# Patient Record
Sex: Female | Born: 1965
Health system: Southern US, Community
[De-identification: ages and names within clinical notes are randomized; demographics above are authoritative.]

## PROBLEM LIST (undated history)

## (undated) DIAGNOSIS — M5431 Sciatica, right side: Secondary | ICD-10-CM

## (undated) DIAGNOSIS — G43909 Migraine, unspecified, not intractable, without status migrainosus: Secondary | ICD-10-CM

## (undated) DIAGNOSIS — F329 Major depressive disorder, single episode, unspecified: Secondary | ICD-10-CM

## (undated) DIAGNOSIS — IMO0002 Reserved for concepts with insufficient information to code with codable children: Secondary | ICD-10-CM

## (undated) DIAGNOSIS — R42 Dizziness and giddiness: Secondary | ICD-10-CM

## (undated) DIAGNOSIS — F32A Depression, unspecified: Secondary | ICD-10-CM

## (undated) DIAGNOSIS — T7840XA Allergy, unspecified, initial encounter: Secondary | ICD-10-CM

## (undated) DIAGNOSIS — Z90710 Acquired absence of both cervix and uterus: Secondary | ICD-10-CM

## (undated) DIAGNOSIS — K219 Gastro-esophageal reflux disease without esophagitis: Secondary | ICD-10-CM

## (undated) DIAGNOSIS — I639 Cerebral infarction, unspecified: Secondary | ICD-10-CM

## (undated) DIAGNOSIS — R001 Bradycardia, unspecified: Secondary | ICD-10-CM

## (undated) DIAGNOSIS — F419 Anxiety disorder, unspecified: Secondary | ICD-10-CM

## (undated) DIAGNOSIS — R55 Syncope and collapse: Secondary | ICD-10-CM

## (undated) DIAGNOSIS — R7303 Prediabetes: Secondary | ICD-10-CM

## (undated) DIAGNOSIS — Z9049 Acquired absence of other specified parts of digestive tract: Secondary | ICD-10-CM

## (undated) DIAGNOSIS — Z720 Tobacco use: Secondary | ICD-10-CM

## (undated) DIAGNOSIS — D509 Iron deficiency anemia, unspecified: Secondary | ICD-10-CM

## (undated) DIAGNOSIS — I10 Essential (primary) hypertension: Secondary | ICD-10-CM

## (undated) DIAGNOSIS — G4730 Sleep apnea, unspecified: Secondary | ICD-10-CM

## (undated) HISTORY — PX: ABDOMINAL HYSTERECTOMY: SHX81

## (undated) HISTORY — PX: CHOLECYSTECTOMY: SHX55

## (undated) HISTORY — PX: HEMORRHOID SURGERY: SHX153

## (undated) HISTORY — DX: Allergy, unspecified, initial encounter: T78.40XA

---

## 2001-02-05 ENCOUNTER — Inpatient Hospital Stay (HOSPITAL_COMMUNITY): Admission: EM | Admit: 2001-02-05 | Discharge: 2001-02-06 | Payer: Self-pay | Admitting: Emergency Medicine

## 2001-02-05 ENCOUNTER — Encounter: Payer: Self-pay | Admitting: *Deleted

## 2001-02-11 ENCOUNTER — Ambulatory Visit (HOSPITAL_COMMUNITY): Admission: RE | Admit: 2001-02-11 | Discharge: 2001-02-11 | Payer: Self-pay | Admitting: Family Medicine

## 2001-06-19 ENCOUNTER — Emergency Department (HOSPITAL_COMMUNITY): Admission: EM | Admit: 2001-06-19 | Discharge: 2001-06-19 | Payer: Self-pay | Admitting: *Deleted

## 2001-06-19 ENCOUNTER — Encounter: Payer: Self-pay | Admitting: *Deleted

## 2002-11-19 ENCOUNTER — Ambulatory Visit (HOSPITAL_COMMUNITY): Admission: RE | Admit: 2002-11-19 | Discharge: 2002-11-19 | Payer: Self-pay | Admitting: Family Medicine

## 2002-11-19 ENCOUNTER — Encounter: Payer: Self-pay | Admitting: Family Medicine

## 2004-04-19 ENCOUNTER — Ambulatory Visit (HOSPITAL_COMMUNITY): Admission: RE | Admit: 2004-04-19 | Discharge: 2004-04-19 | Payer: Self-pay | Admitting: Family Medicine

## 2004-07-11 ENCOUNTER — Ambulatory Visit (HOSPITAL_COMMUNITY): Admission: RE | Admit: 2004-07-11 | Discharge: 2004-07-11 | Payer: Self-pay | Admitting: *Deleted

## 2005-01-22 ENCOUNTER — Emergency Department (HOSPITAL_COMMUNITY): Admission: EM | Admit: 2005-01-22 | Discharge: 2005-01-22 | Payer: Self-pay | Admitting: Emergency Medicine

## 2005-05-21 HISTORY — PX: COLONOSCOPY WITH ESOPHAGOGASTRODUODENOSCOPY (EGD): SHX5779

## 2005-09-04 ENCOUNTER — Ambulatory Visit (HOSPITAL_COMMUNITY): Admission: RE | Admit: 2005-09-04 | Discharge: 2005-09-04 | Payer: Self-pay | Admitting: Family Medicine

## 2005-10-18 ENCOUNTER — Ambulatory Visit: Payer: Self-pay | Admitting: Internal Medicine

## 2005-10-25 ENCOUNTER — Emergency Department (HOSPITAL_COMMUNITY): Admission: EM | Admit: 2005-10-25 | Discharge: 2005-10-25 | Payer: Self-pay | Admitting: Emergency Medicine

## 2005-10-25 ENCOUNTER — Encounter: Payer: Self-pay | Admitting: Internal Medicine

## 2005-11-17 ENCOUNTER — Emergency Department (HOSPITAL_COMMUNITY): Admission: EM | Admit: 2005-11-17 | Discharge: 2005-11-17 | Payer: Self-pay | Admitting: *Deleted

## 2005-12-03 ENCOUNTER — Encounter (INDEPENDENT_AMBULATORY_CARE_PROVIDER_SITE_OTHER): Payer: Self-pay | Admitting: *Deleted

## 2005-12-03 ENCOUNTER — Ambulatory Visit: Payer: Self-pay | Admitting: Internal Medicine

## 2006-01-24 ENCOUNTER — Encounter (HOSPITAL_COMMUNITY): Admission: RE | Admit: 2006-01-24 | Discharge: 2006-02-16 | Payer: Self-pay | Admitting: Family Medicine

## 2006-03-07 ENCOUNTER — Ambulatory Visit (HOSPITAL_COMMUNITY): Admission: RE | Admit: 2006-03-07 | Discharge: 2006-03-08 | Payer: Self-pay | Admitting: General Surgery

## 2006-03-07 ENCOUNTER — Encounter (INDEPENDENT_AMBULATORY_CARE_PROVIDER_SITE_OTHER): Payer: Self-pay | Admitting: Specialist

## 2006-12-02 ENCOUNTER — Ambulatory Visit: Payer: Self-pay | Admitting: Internal Medicine

## 2006-12-05 ENCOUNTER — Ambulatory Visit: Payer: Self-pay | Admitting: Cardiology

## 2007-02-12 ENCOUNTER — Ambulatory Visit: Payer: Self-pay | Admitting: Internal Medicine

## 2007-04-20 ENCOUNTER — Emergency Department (HOSPITAL_COMMUNITY): Admission: EM | Admit: 2007-04-20 | Discharge: 2007-04-20 | Payer: Self-pay | Admitting: Emergency Medicine

## 2007-08-01 DIAGNOSIS — K625 Hemorrhage of anus and rectum: Secondary | ICD-10-CM

## 2007-08-01 DIAGNOSIS — K529 Noninfective gastroenteritis and colitis, unspecified: Secondary | ICD-10-CM | POA: Insufficient documentation

## 2007-08-01 DIAGNOSIS — R1013 Epigastric pain: Secondary | ICD-10-CM

## 2007-08-01 DIAGNOSIS — R197 Diarrhea, unspecified: Secondary | ICD-10-CM

## 2007-08-01 DIAGNOSIS — D509 Iron deficiency anemia, unspecified: Secondary | ICD-10-CM

## 2007-09-16 ENCOUNTER — Ambulatory Visit: Admission: RE | Admit: 2007-09-16 | Discharge: 2007-09-16 | Payer: Self-pay | Admitting: Family Medicine

## 2008-02-23 ENCOUNTER — Emergency Department (HOSPITAL_COMMUNITY): Admission: EM | Admit: 2008-02-23 | Discharge: 2008-02-23 | Payer: Self-pay | Admitting: Emergency Medicine

## 2008-10-12 ENCOUNTER — Emergency Department (HOSPITAL_COMMUNITY): Admission: EM | Admit: 2008-10-12 | Discharge: 2008-10-12 | Payer: Self-pay | Admitting: Emergency Medicine

## 2010-06-05 ENCOUNTER — Emergency Department (HOSPITAL_COMMUNITY)
Admission: EM | Admit: 2010-06-05 | Discharge: 2010-06-05 | Payer: Self-pay | Source: Home / Self Care | Admitting: Emergency Medicine

## 2010-06-07 LAB — POCT CARDIAC MARKERS
CKMB, poc: <1 ng/mL — ABNORMAL LOW (ref 1.0–8.0)
CKMB, poc: <1 ng/mL — ABNORMAL LOW (ref 1.0–8.0)
Myoglobin, poc: 48.8 ng/mL (ref 12–200)
Myoglobin, poc: 38.6 ng/mL (ref 12–200)
Troponin i, poc: <0.05 ng/mL (ref 0.00–0.09)
Troponin i, poc: <0.05 ng/mL (ref 0.00–0.09)

## 2010-06-07 LAB — CBC
HCT: 36.4 % (ref 36.0–46.0)
Hemoglobin: 13.1 g/dL (ref 12.0–15.0)
MCH: 32.8 pg (ref 26.0–34.0)
MCHC: 36 g/dL (ref 30.0–36.0)
MCV: 91 fL (ref 78.0–100.0)
Platelets: 160 10*3/uL (ref 150–400)
RBC: 4 MIL/uL (ref 3.87–5.11)
RDW: 12.7 % (ref 11.5–15.5)
WBC: 4.7 10*3/uL (ref 4.0–10.5)

## 2010-06-07 LAB — BASIC METABOLIC PANEL
BUN: 11 mg/dL (ref 6–23)
CO2: 29 mEq/L (ref 19–32)
Calcium: 8.8 mg/dL (ref 8.4–10.5)
Chloride: 104 mEq/L (ref 96–112)
Creatinine, Ser: 1.09 mg/dL (ref 0.4–1.2)
GFR calc Af Amer: >60 mL/min (ref >60)
GFR calc non Af Amer: 55 mL/min — ABNORMAL LOW (ref >60)
Glucose, Bld: 105 mg/dL — ABNORMAL HIGH (ref 70–99)
Potassium: 3.2 mEq/L — ABNORMAL LOW (ref 3.5–5.1)
Sodium: 138 mEq/L (ref 135–145)

## 2010-06-09 ENCOUNTER — Ambulatory Visit (HOSPITAL_COMMUNITY)
Admission: RE | Admit: 2010-06-09 | Discharge: 2010-06-09 | Payer: Self-pay | Source: Home / Self Care | Attending: Family Medicine | Admitting: Family Medicine

## 2010-08-29 LAB — CBC
HCT: 40.2 % (ref 36.0–46.0)
Hemoglobin: 14.1 g/dL (ref 12.0–15.0)
MCHC: 35.2 g/dL (ref 30.0–36.0)
MCV: 94.8 fL (ref 78.0–100.0)
Platelets: 153 10*3/uL (ref 150–400)
RBC: 4.23 MIL/uL (ref 3.87–5.11)
RDW: 12.7 % (ref 11.5–15.5)
WBC: 5.4 10*3/uL (ref 4.0–10.5)

## 2010-08-29 LAB — COMPREHENSIVE METABOLIC PANEL
ALT: 12 U/L (ref 0–35)
AST: 21 U/L (ref 0–37)
Albumin: 3.6 g/dL (ref 3.5–5.2)
Alkaline Phosphatase: 61 U/L (ref 39–117)
BUN: 4 mg/dL — ABNORMAL LOW (ref 6–23)
CO2: 32 mEq/L (ref 19–32)
Calcium: 9.5 mg/dL (ref 8.4–10.5)
Chloride: 104 mEq/L (ref 96–112)
Creatinine, Ser: 0.98 mg/dL (ref 0.4–1.2)
GFR calc Af Amer: >60 mL/min (ref >60)
GFR calc non Af Amer: >60 mL/min (ref >60)
Glucose, Bld: 78 mg/dL (ref 70–99)
Potassium: 3.2 mEq/L — ABNORMAL LOW (ref 3.5–5.1)
Sodium: 141 mEq/L (ref 135–145)
Total Bilirubin: 0.6 mg/dL (ref 0.3–1.2)
Total Protein: 6.6 g/dL (ref 6.0–8.3)

## 2010-08-29 LAB — GLUCOSE, CAPILLARY: Glucose-Capillary: 124 mg/dL — ABNORMAL HIGH (ref 70–99)

## 2010-08-29 LAB — URINALYSIS, ROUTINE W REFLEX MICROSCOPIC
Bilirubin Urine: NEGATIVE
Glucose, UA: NEGATIVE mg/dL
Hgb urine dipstick: NEGATIVE
Ketones, ur: NEGATIVE mg/dL
Nitrite: NEGATIVE
Protein, ur: NEGATIVE mg/dL
Specific Gravity, Urine: 1.01 (ref 1.005–1.030)
Urobilinogen, UA: 0.2 mg/dL (ref 0.0–1.0)
pH: 5.5 (ref 5.0–8.0)

## 2010-08-29 LAB — DIFFERENTIAL
Basophils Absolute: 0 10*3/uL (ref 0.0–0.1)
Basophils Relative: 0 % (ref 0–1)
Eosinophils Absolute: 0.1 10*3/uL (ref 0.0–0.7)
Eosinophils Relative: 2 % (ref 0–5)
Lymphocytes Relative: 21 % (ref 12–46)
Lymphs Abs: 1.1 10*3/uL (ref 0.7–4.0)
Monocytes Absolute: 0.5 10*3/uL (ref 0.1–1.0)
Monocytes Relative: 9 % (ref 3–12)
Neutro Abs: 3.7 10*3/uL (ref 1.7–7.7)
Neutrophils Relative %: 69 % (ref 43–77)

## 2010-08-29 LAB — URINE MICROSCOPIC-ADD ON

## 2010-08-29 LAB — PREGNANCY, URINE: Preg Test, Ur: NEGATIVE

## 2010-10-03 NOTE — Procedures (Signed)
[name] [name], [name] [name]               ACCOUNT NO.:  [id number]   MEDICAL RECORD NO.:  [id number]          PATIENT TYPE:  OUT   LOCATION:  SLEE                          FACILITY:  [facility]   PHYSICIAN:  Kofi A. Gerilyn Pilgrim, M.D. DATE OF BIRTH:  Mar 02, 1966   DATE OF PROCEDURE:  09/16/2007  DATE OF DISCHARGE:  09/16/2007                             SLEEP DISORDER REPORT   POLYSOMNOGRAPHY REPORT   INDICATIONS:  This is a 45 year old lady who presents with snoring and  daytime fatigue.  Epworth sleepiness scale 8.  BMI 25.   MEDICATIONS:  Xanax and Estratest.   SLEEP STAGE SUMMARY:  The total recording time is 376 minutes.  The  sleep efficiency is 97%.  Sleep latency 4.5 minutes.  REM latency 185  minutes.  Stage N1 3%, N2 85%, N3 0, and REM sleep 9%.   RESPIRATORY SUMMARY:  The baseline oxygen saturation is 99%, lowest  saturation 91%.  AHI 0.8.   LEG MOVEMENT SUMMARY:  There is no significant leg movements observed.  The index is 0.   ELECTROCARDIOGRAM SUMMARY:  Average heart rate 57 with no disarrhythmias  observed.   IMPRESSION:  This recording is essentially unremarkable, since she does  have somewhat of an early sleep latency, however.  If further workup is  required, consider sleep consultation.   Thanks for this referral.      Kofi A. Gerilyn Pilgrim, M.D.  Electronically Signed     KAD/MEDQ  D:  09/20/2007  T:  09/20/2007  Job:  [id number]

## 2010-10-03 NOTE — Assessment & Plan Note (Signed)
[name]                         GASTROENTEROLOGY OFFICE NOTE   NAME:[name] [name], [name] [name]                      MRN:          [id number]  DATE:12/02/2006                            DOB:          08/07/1965    HISTORY OF PRESENT ILLNESS:  [name] [name] is a 45 year old African American  female patient of Dr. Lilyan Punt who comes with one episode of rather  large volume rectal bleeding which was evaluated by Dr. Gerda Diss within 24  hours of the event.  This occurred about three weeks ago.  She, since  then, has not had any rectal bleeding.  She had about two more bowel  movements with blood following the first episode within 24 hours, but  since then has done well.  She denies any rectal pain or irritation.  Dr. Gerda Diss examined her in the office and gave her Anusol suppositories  which she took only for three nights but stopped doing it because of  constipation.  We saw [name] [name] in the past for iron deficiency anemia.  She had upper endoscopy, colonoscopy and small bowel follow through in  July 2007, with no particular diagnosis.  There is a history of a  hemorrhoidectomy several years ago which was done by [facility].  She  denies being constipation.  There is low abdominal pain, sometimes more  so in right lobe of quadrant.  There is no family history of Crohn's  disease.   MEDICATIONS:  1. Centex one daily.  2. Iron supplements b.i.d.  3. Xanax 1 mg q.h.s.   PHYSICAL EXAMINATION:  VITAL SIGNS:  Blood pressure 98/64, pulse 60,  weight 128 pounds.  GENERAL:  She was alert and oriented, no distress.  LUNGS:  Clear to auscultation.  COR:  S1, S2 normal.  ABDOMEN:  Soft.  Somewhat tender in right lower quadrant without  associated fullness.  Bowel sounds were normoactive.  There was no  distention.  RECTAL/ANOSCOPIC EXAM:  Normal perianal area.  Rectal tone was somewhat  increased.  The patient was alert and comfortable and anoscope passed  into the rectal  ampulla. There was erythema of the rectal folds in the  anal canal, but no definite fissure and no bleeding noted.  Stools  hemoccult negative.  Mucosa of the rectal ampulla did not show any  evidence of proctitis.  There were just small hemorrhoids.  None of them  showed thrombosis.   IMPRESSION:  A 45 year old female with status post remote  hemorrhoidectomy with a large volume rectal bleeding recently.  It is  not clear from the endoscopic exam whether the erythema in the anal  canal is really what caused her problem.  She has been treated partially  with Anusol suppositories  and the mucosa appears reasonably good today.  Because of the right lower quadrant tenderness and patient's concern for  __________ amount of the bleeding, we will proceed with CT scan of the  abdomen and pelvis to rule out Crohn's disease.  Since she could not  tolerate any IC suppositories, I feel she needs some additional topical  steroids in the rectum,  and I gave her  ProctoFoam HC to use, since it does not require any  foreign body in the rectum, and would not likely alter her bowel habits.     [name] [name]. Juanda Chance, MD  Electronically Signed    DMB/MedQ  DD: 12/02/2006  DT: 12/03/2006  Job #: [id number]   cc:   Lorin Picket A. Gerda Diss, MD

## 2010-10-03 NOTE — Assessment & Plan Note (Signed)
[facility]                         GASTROENTEROLOGY OFFICE NOTE   NAME:[name], [name] REAL                      MRN:          [id number]  DATE:02/12/2007                            DOB:          1966/03/23    The patient is a 45 year old African-American female with recent onset  of diarrhea and crampy abdominal pain.  She called our office on  January 30, 2007, was out of town.  She was given Bentyl, Prilosec,  and Imodium, but it did not really do anything other than taking  Imodium.  Her stools are soft to loose, occurring up to eight times a  day.  She feels it is most likely stress related.  She had to move out  of her home, staying with friends.  She is a single parent.  The patient  continues to work full time for [employer].  She denies any rectal  bleeding.  We have evaluated her in the past for her iron deficiency  anemia as well as for abdominal pain.  She had a normal endoscopy and  colonoscopy in July of 2007 with random biopsies of the colon showing  normal appearing mucosa including terminal ileum.  She was suspected to  have Crohn's disease, but the workup was negative.  Most recently a CT  of abdomen and pelvis on December 05, 2006, showed essentially normal  examination with changes consistent with post cholecystectomy state  having some prominence of the common bile duct and hepatic ducts, but  everything otherwise was normal.   MEDICATIONS:  1. Syntax one p.o. daily.  2. Iron supplements one b.i.d.  3. Xanax 1 mg q.h.s.   PHYSICAL EXAMINATION:  VITAL SIGNS:  Blood pressure 108/64, pulse 56,  and weight 126 pounds.  GENERAL:  She was in no distress, healthy appearing.  LUNGS:  Clear to auscultation.  HEART:  Normal S1 and normal S2.  ABDOMEN:  Soft, diffusely tender with a well-healed surgical scar,  minimal tenderness in epigastrium.  RECTAL:  Not done.   IMPRESSION:  A 45 year old African-American female with post  cholecystectomy diarrhea and irritable bowel syndrome.  No evidence of  Crohn's disease based on a negative GI workup which has included  endoscopy, colonoscopy, CT scan of the abdomen as well as small bowel  follow through.   PLAN:  1. Continue Imodium once or twice a day.  2. Add Align as a probiotic.  3. Increase Xanax to 0.25 mg q.a.m., 0.5 mg q.h.s.  4. Samples of Aciphex 20 mg to be used daily for 10 days.  5. Consider using Questran for the diarrhea.   If she had to have another colonoscopy, I would opt for propofol  anesthesia because she was difficult to sedate and had a lot of pain  during the procedure.     Hedwig Morton. Juanda Chance, MD  Electronically Signed    DMB/MedQ  DD: 02/12/2007  DT: 02/13/2007  Job #: [id number]   cc:   Lorin Picket A. Gerda Diss, MD

## 2010-10-06 NOTE — Op Note (Signed)
NAMEBIRTHA, [name] [name]               ACCOUNT NO.:  [id number]   MEDICAL RECORD NO.:  [id number]          PATIENT TYPE:  OIB   LOCATION:  5733                         FACILITY:  [facility]   PHYSICIAN:  Ollen Gross. Vernell Morgans, M.D. DATE OF BIRTH:  1965-06-12   DATE OF PROCEDURE:  03/07/2006  DATE OF DISCHARGE:  03/08/2006                               OPERATIVE REPORT   PREOPERATIVE DIAGNOSIS:  Biliary dyskinesia.   POSTOPERATIVE DIAGNOSIS:  Biliary dyskinesia.   PROCEDURE:  Laparoscopic cholecystectomy with intraoperative  cholangiogram.   SURGEON:  Ollen Gross. Carolynne Edouard, M.D.   ASSISTANT:  Anselm Pancoast. Zachery Dakins, M.D.   ANESTHESIA:  General endotracheal.   PROCEDURE:  After informed consent was obtained, the patient was brought  to the operating room, placed in the supine position on the operating  room table.  After adequate induction of general anesthesia, the  patient's abdomen was prepped with Betadine and draped in the usual  sterile manner.  The area below the umbilicus was then infiltrated with  0.25% Marcaine.  A small incision was made with a 15-blade knife.  This  incision was carried down through the subcutaneous tissue bluntly with  the hemostat and [employer] retractors until the linea alba was  identified.  The linea alba was incised with a 15-blade knife and each  side was grasped with Kocher clamps and elevated anteriorly.  The  preperitoneal space was then probed bluntly with a hemostat until the  peritoneum was opened and access was gained to the abdominal cavity.  A  0 Vicryl pursestring stitch was placed in the fascia around the opening.  Hasson cannula was placed through the opening and anchored in place with  the previously placed 0 Vicryl pursestring stitch.  The abdomen was then  insufflated with carbon dioxide without difficulty.  The laparoscope was  inserted through the Hasson cannula, and the right upper quadrant was  inspected.  The dome of the gallbladder and liver were  readily  identified.  The patient was placed in the head-up position and rotated  slightly right side up.  Next, the epigastric region was infiltrated  with 0.25% Marcaine.  A small incision was made with a 15-blade knife  and a 10-mm port was placed bluntly through this incision into the  abdominal cavity under direct vision.  Sites were then chosen laterally  on the right side of the abdomen for placement of the 5-mm ports.  Each  of these areas was infiltrated with 0.25% Marcaine.  Small stab  incisions were made with the 15-blade knife, and 5-mm ports were placed  bluntly through these incisions into the abdominal cavity under direct  vision.  A blunt grasper was placed through the lateral-most 5-mm port  and used to grasp the dome of the gallbladder and elevate it anteriorly  and superiorly.  Another blunt grasper was placed through the other 5-mm  port and used to retract on the body and neck of the gallbladder.  Dissector was placed through the epigastric port, and using the  electrocautery the peritoneal reflection was opened at the gallbladder  neck.  Blunt  dissection was then carried out in this area until the  gallbladder neck/cystic duct junction was readily identified and a good  window was created.  A single clip was placed on the gallbladder neck.  A small ductotomy was made just below the clip with the laparoscopic  scissors.  A 14-gauge Angiocath was placed percutaneously through the  anterior abdominal wall under direct vision.  A Reddick cholangiogram  catheter was placed through the Angiocath and flushed.  The Reddick  catheter was then placed within the cystic duct and anchored in place  with a clip.  A cholangiogram was obtained that showed no filling  defects, good emptying into the duodenum and adequate length of the  cystic duct.  Anchoring clip and catheters were then removed from the  patient.  Three clips were placed proximally on the cystic duct, and the  duct  was divided between the two sets of clips.  Posterior to this the  cystic artery was identified and again dissected bluntly in a  circumferential manner until a good window was created.  Two clips were  placed proximally and one distally on the artery, and the artery was  divided between the two sets of clips.  Next, a laparoscopic hook  cautery device was used to separate the gallbladder from the liver bed.  Prior to completely detaching the gallbladder from the liver bed, the  liver bed was inspected and several small bleeding points were  coagulated with the electrocautery until the area was completely  hemostatic.  A small vessel along the liver bed was also controlled with  clips.  The gallbladder was then detached the rest of the way from the  liver bed without difficulty.  A laparoscopic bag was placed through the  epigastric port.  The gallbladder was placed within the bag, and the bag  was sealed.  The laparoscope was then moved to the epigastric port.  The  gallbladder grasper was placed through the Hasson cannula and used to  grasp the opening of the bag, and the bag with the gallbladder was  removed with the Hasson cannula through the infraumbilical port without  difficulty.  The abdomen was irrigated with copious amounts of saline  until the effluent was clear.  The fascial defect was then closed with  the previous 0 Vicryl pursestring stitch as well as another interrupted  0 Vicryl stitch.  The rest of the ports were removed under direct vision  and were found to be hemostatic.  Gas was allowed to escape.  The skin  incisions were all closed with interrupted 4-0 Monocryl subcuticular  stitches.  Benzoin and Steri-Strips were applied.  The patient tolerated  the procedure well.  At the end of the case, all needle, sponge and  instrument counts were correct.  The patient was then awakened and taken to recovery in stable condition.      Ollen Gross. Vernell Morgans, M.D.   Electronically Signed     PST/MEDQ  D:  03/08/2006  T:  03/09/2006  Job:  [id number]

## 2010-10-06 NOTE — Procedures (Signed)
[facility]  Patient:    [name] [name], [name] [name] Visit Number: [id number] MRN: [id number]          Service Type: OUT Location: LAB Attending Physician:  Aundra Dubin Dictated by:   Lilyan Punt, M.D. Admit Date:  03/24/2001 Discharge Date: 03/24/2001                            EKG Interpretations  INTERPRETATION:  Normal sinus rhythm.  No acute ST segment changes.  IMPRESSION:  Stable electrocardiogram. Dictated by:   Lilyan Punt, M.D. Attending Physician:  Aundra Dubin DD:  03/27/01 TD:  03/28/01 Job: [id number]

## 2010-10-06 NOTE — Consult Note (Signed)
NAMESHANTELLA, BLUBAUGH               ACCOUNT NO.:  [id number]   MEDICAL RECORD NO.:  [id number]          PATIENT TYPE:  EMS   LOCATION:  ED                            FACILITY:  [facility]   PHYSICIAN:  Dalia Heading, M.D.  DATE OF BIRTH:  21-Feb-1966   DATE OF CONSULTATION:  01/22/2005  DATE OF DISCHARGE:                                   CONSULTATION   HISTORY OF PRESENT ILLNESS:  The patient is a 45 year old black female  status post hemorrhoidectomy three years ago who presents with a two-day  history of worsening rectal pain. She has been constipated recently  secondary to iron supplements. A little bright red blood per rectum has been  noted when she wipes herself.   PAST MEDICAL HISTORY:  As noted above, anemia.   PAST SURGICAL HISTORY:  As noted above.   CURRENT MEDICATIONS:  Iron supplements, Estratest..   ALLERGIES:  Sulfa.   REVIEW OF SYSTEMS:  The patient does smoke. She denies any alcohol use.   PHYSICAL EXAMINATION:  GENERAL:  The patient is pleasant 45 year old black  female who is well-developed, well-nourished, in no acute distress.  RECTAL EXAMINATION:  A minimally enlarged internal hemorrhoid at the 7  o'clock position. Examination was limited secondary to pain. No external  hemorrhoids were noted. No thrombosis of hemorrhoids were noted.   IMPRESSION:  Hemorrhoidal disease.   PLAN:  The patient will be discharged from the emergency room on Anusol [facility]  suppositories 1 per rectum b.i.d. and Percocet 10/325 mg p.o. q.4 h p.r.n.  pain. She is to call my office on January 23, 2005 for follow-up  appointment on January 25, 2005.  She is to avoid constipation. She may  soak in a tub twice a day. No need for acute surgical intervention is  warranted at this time.      Dalia Heading, M.D.  Electronically Signed     MAJ/MEDQ  D:  01/22/2005  T:  01/22/2005  Job:  [id number]   cc:   Donna Bernard, M.D.  [address]  [place]  [place] [address]  Fax:  [phone]

## 2010-11-10 ENCOUNTER — Other Ambulatory Visit: Payer: Self-pay | Admitting: Obstetrics and Gynecology

## 2011-02-19 LAB — BASIC METABOLIC PANEL
CO2: 27
Calcium: 8.7
Chloride: 105
Creatinine, Ser: 0.97
GFR calc Af Amer: >60
Sodium: 138

## 2011-02-19 LAB — WET PREP, GENITAL

## 2011-02-19 LAB — URINALYSIS, ROUTINE W REFLEX MICROSCOPIC
Bilirubin Urine: NEGATIVE
Ketones, ur: NEGATIVE
Nitrite: NEGATIVE
Protein, ur: NEGATIVE

## 2011-02-19 LAB — GC/CHLAMYDIA PROBE AMP, GENITAL: Chlamydia, DNA Probe: NEGATIVE

## 2011-02-27 LAB — URINALYSIS, ROUTINE W REFLEX MICROSCOPIC
Bilirubin Urine: NEGATIVE
Glucose, UA: NEGATIVE
Ketones, ur: NEGATIVE
Nitrite: NEGATIVE
Specific Gravity, Urine: 1.015
pH: 8

## 2011-02-27 LAB — URINE MICROSCOPIC-ADD ON

## 2011-03-06 ENCOUNTER — Emergency Department (HOSPITAL_COMMUNITY)
Admission: EM | Admit: 2011-03-06 | Discharge: 2011-03-06 | Disposition: A | Discharge status: Home / Self Care | Payer: No Typology Code available for payment source | Attending: Emergency Medicine | Admitting: Emergency Medicine

## 2011-03-06 ENCOUNTER — Emergency Department (HOSPITAL_COMMUNITY): Payer: No Typology Code available for payment source

## 2011-03-06 ENCOUNTER — Encounter (HOSPITAL_COMMUNITY): Payer: Self-pay

## 2011-03-06 DIAGNOSIS — R079 Chest pain, unspecified: Secondary | ICD-10-CM | POA: Insufficient documentation

## 2011-03-06 DIAGNOSIS — R51 Headache: Secondary | ICD-10-CM | POA: Insufficient documentation

## 2011-03-06 DIAGNOSIS — S40019A Contusion of unspecified shoulder, initial encounter: Secondary | ICD-10-CM | POA: Insufficient documentation

## 2011-03-06 DIAGNOSIS — K219 Gastro-esophageal reflux disease without esophagitis: Secondary | ICD-10-CM | POA: Insufficient documentation

## 2011-03-06 DIAGNOSIS — S7000XA Contusion of unspecified hip, initial encounter: Secondary | ICD-10-CM | POA: Insufficient documentation

## 2011-03-06 DIAGNOSIS — M25559 Pain in unspecified hip: Secondary | ICD-10-CM | POA: Insufficient documentation

## 2011-03-06 DIAGNOSIS — M542 Cervicalgia: Secondary | ICD-10-CM | POA: Insufficient documentation

## 2011-03-06 DIAGNOSIS — F341 Dysthymic disorder: Secondary | ICD-10-CM | POA: Insufficient documentation

## 2011-03-06 DIAGNOSIS — M25519 Pain in unspecified shoulder: Secondary | ICD-10-CM | POA: Insufficient documentation

## 2011-03-06 DIAGNOSIS — S20219A Contusion of unspecified front wall of thorax, initial encounter: Secondary | ICD-10-CM | POA: Insufficient documentation

## 2011-03-06 DIAGNOSIS — M25529 Pain in unspecified elbow: Secondary | ICD-10-CM | POA: Insufficient documentation

## 2011-03-06 DIAGNOSIS — S139XXA Sprain of joints and ligaments of unspecified parts of neck, initial encounter: Secondary | ICD-10-CM | POA: Insufficient documentation

## 2011-03-06 DIAGNOSIS — S5000XA Contusion of unspecified elbow, initial encounter: Secondary | ICD-10-CM | POA: Insufficient documentation

## 2011-03-06 DIAGNOSIS — Z79899 Other long term (current) drug therapy: Secondary | ICD-10-CM | POA: Insufficient documentation

## 2011-03-06 HISTORY — DX: Reserved for concepts with insufficient information to code with codable children: IMO0002

## 2011-03-06 HISTORY — DX: Major depressive disorder, single episode, unspecified: F32.9

## 2011-03-06 HISTORY — DX: Depression, unspecified: F32.A

## 2011-03-06 HISTORY — DX: Acquired absence of other specified parts of digestive tract: Z90.49

## 2011-03-06 HISTORY — DX: Anxiety disorder, unspecified: F41.9

## 2011-03-06 HISTORY — DX: Acquired absence of both cervix and uterus: Z90.710

## 2011-03-06 HISTORY — DX: Gastro-esophageal reflux disease without esophagitis: K21.9

## 2011-09-19 ENCOUNTER — Other Ambulatory Visit: Payer: Self-pay | Admitting: Family Medicine

## 2011-09-19 ENCOUNTER — Ambulatory Visit (HOSPITAL_COMMUNITY)
Admission: RE | Admit: 2011-09-19 | Discharge: 2011-09-19 | Disposition: A | Discharge status: Home / Self Care | Payer: 59 | Source: Ambulatory Visit | Attending: Family Medicine | Admitting: Family Medicine

## 2011-09-19 DIAGNOSIS — M47817 Spondylosis without myelopathy or radiculopathy, lumbosacral region: Secondary | ICD-10-CM | POA: Insufficient documentation

## 2011-09-19 DIAGNOSIS — M792 Neuralgia and neuritis, unspecified: Secondary | ICD-10-CM

## 2011-09-25 ENCOUNTER — Other Ambulatory Visit: Payer: Self-pay | Admitting: Family Medicine

## 2011-09-25 DIAGNOSIS — M549 Dorsalgia, unspecified: Secondary | ICD-10-CM

## 2011-09-28 ENCOUNTER — Ambulatory Visit (HOSPITAL_COMMUNITY)
Admission: RE | Admit: 2011-09-28 | Discharge: 2011-09-28 | Disposition: A | Discharge status: Home / Self Care | Payer: 59 | Source: Ambulatory Visit | Attending: Family Medicine | Admitting: Family Medicine

## 2011-09-28 DIAGNOSIS — M48061 Spinal stenosis, lumbar region without neurogenic claudication: Secondary | ICD-10-CM | POA: Insufficient documentation

## 2011-09-28 DIAGNOSIS — R209 Unspecified disturbances of skin sensation: Secondary | ICD-10-CM | POA: Insufficient documentation

## 2011-09-28 DIAGNOSIS — M549 Dorsalgia, unspecified: Secondary | ICD-10-CM

## 2011-09-28 DIAGNOSIS — M545 Low back pain, unspecified: Secondary | ICD-10-CM | POA: Insufficient documentation

## 2011-09-28 DIAGNOSIS — M5126 Other intervertebral disc displacement, lumbar region: Secondary | ICD-10-CM | POA: Insufficient documentation

## 2011-09-28 DIAGNOSIS — M25559 Pain in unspecified hip: Secondary | ICD-10-CM | POA: Insufficient documentation

## 2011-10-18 ENCOUNTER — Encounter (HOSPITAL_COMMUNITY): Payer: Self-pay | Admitting: *Deleted

## 2011-10-18 ENCOUNTER — Emergency Department (HOSPITAL_COMMUNITY)
Admission: EM | Admit: 2011-10-18 | Discharge: 2011-10-18 | Disposition: A | Discharge status: Home / Self Care | Payer: 59 | Attending: Emergency Medicine | Admitting: Emergency Medicine

## 2011-10-18 DIAGNOSIS — M545 Low back pain, unspecified: Secondary | ICD-10-CM | POA: Insufficient documentation

## 2011-10-18 DIAGNOSIS — R209 Unspecified disturbances of skin sensation: Secondary | ICD-10-CM | POA: Insufficient documentation

## 2011-10-18 DIAGNOSIS — IMO0002 Reserved for concepts with insufficient information to code with codable children: Secondary | ICD-10-CM | POA: Insufficient documentation

## 2011-10-18 DIAGNOSIS — M5416 Radiculopathy, lumbar region: Secondary | ICD-10-CM

## 2011-10-18 HISTORY — DX: Sciatica, right side: M54.31

## 2011-10-18 MED ORDER — HYDROCODONE-ACETAMINOPHEN 5-325 MG PO TABS: ORAL_TABLET | ORAL | Status: DC

## 2011-10-18 MED ORDER — HYDROMORPHONE HCL PF 1 MG/ML IJ SOLN
1.0000 mg | Freq: Once | INTRAMUSCULAR | Status: AC
  Administered 2011-10-18: 1 mg via INTRAMUSCULAR
  Filled 2011-10-18: qty 1

## 2011-10-18 MED ORDER — PREDNISONE 50 MG PO TABS: ORAL_TABLET | ORAL | Status: AC

## 2011-10-18 MED ORDER — ONDANSETRON 4 MG PO TBDP
4.0000 mg | ORAL_TABLET | Freq: Once | ORAL | Status: AC
  Administered 2011-10-18: 4 mg via ORAL
  Filled 2011-10-18: qty 1

## 2011-10-18 MED ORDER — PREDNISONE 20 MG PO TABS
60.0000 mg | ORAL_TABLET | Freq: Once | ORAL | Status: AC
  Administered 2011-10-18: 60 mg via ORAL
  Filled 2011-10-18: qty 3

## 2011-10-18 NOTE — ED Notes (Signed)
Pt has been dx with "nerve pain that shoots down my leg, saw a neurologist last week". This flare up started this evening while sitting at work. Pain 10/10. No numbness in extremities now.

## 2011-10-18 NOTE — ED Provider Notes (Signed)
History     CSN: [id number]  Arrival date & time 10/18/11  4098   First MD Initiated Contact with Patient 10/18/11 1914      Chief Complaint  Patient presents with  . Back Pain    (Consider location/radiation/quality/duration/timing/severity/associated sxs/prior treatment) HPI Comments: Pt has been having lower back with radiation to L leg for ~ 1 month.  Had OP MRI on 09-28-11 ordered by dr. Fletcher Anon, her PCP.  She was referred to and has been seen by dr. Jeral Fruit last week.  She declined pain meds then preferring other alternatives.  He recommended exercises.  She does not recall any particular movement today at work but her pain became much more intense and she wants something for pain.  Patient is a 46 y.o. female presenting with back pain. The history is provided by the patient. No language interpreter was used.  Back Pain  This is a new problem. Episode onset: at work ~ 1700. The problem occurs constantly. The problem has been rapidly worsening. The pain is associated with no known injury. The pain is present in the lumbar spine. The pain is severe. The symptoms are aggravated by bending, twisting and certain positions. Associated symptoms include leg pain and paresthesias. Pertinent negatives include no fever, no numbness, no bowel incontinence, no perianal numbness, no paresis and no weakness. She has tried nothing for the symptoms.    Past Medical History  Diagnosis Date  . Anxiety   . GERD (gastroesophageal reflux disease)   . Hx of cholecystectomy   . History of hysterectomy   . History of oophorectomy   . Depression   . Sciatica neuralgia, right     History reviewed. No pertinent past surgical history.  History reviewed. No pertinent family history.  History  Substance Use Topics  . Smoking status: Never Smoker   . Smokeless tobacco: Not on file  . Alcohol Use: No    OB History    Grav Para Term Preterm Abortions TAB SAB Ect Mult Living                   Review of Systems  Constitutional: Negative for fever and chills.  Gastrointestinal: Negative for bowel incontinence.  Musculoskeletal: Positive for back pain.  Neurological: Positive for paresthesias. Negative for weakness and numbness.  All other systems reviewed and are negative.    Allergies  Sulfa antibiotics  Home Medications   Current Outpatient Rx  Name Route Sig Dispense Refill  . HYDROCODONE-ACETAMINOPHEN 5-325 MG PO TABS  One tab po q 4-6 hrs prn pain 20 tablet 0  . PREDNISONE 50 MG PO TABS  One tab po qQD 6 tablet 0    BP 129/77  Pulse 62  Temp(Src) 98.1 F (36.7 C) (Oral)  Resp 16  Ht 5\' 2"  (1.575 m)  Wt 145 lb (65.772 kg)  BMI 26.52 kg/m2  SpO2 100%  LMP 05/21/1997  Physical Exam  Nursing note and vitals reviewed. Constitutional: She is oriented to person, place, and time. She appears well-developed and well-nourished. No distress.  HENT:  Head: Normocephalic and atraumatic.  Eyes: EOM are normal.  Neck: Normal range of motion.  Cardiovascular: Normal rate, regular rhythm and normal heart sounds.   Pulmonary/Chest: Effort normal and breath sounds normal.  Abdominal: Soft. She exhibits no distension. There is no tenderness.  Musculoskeletal:       Lumbar back: She exhibits decreased range of motion and pain. She exhibits no tenderness, no bony tenderness and normal pulse.  Back:  Neurological: She is alert and oriented to person, place, and time. She has normal reflexes. Coordination normal.  Skin: Skin is warm and dry.  Psychiatric: She has a normal mood and affect. Judgment normal.    ED Course  Procedures (including critical care time)  Labs Reviewed - No data to display No results found.   1. Lumbar back pain   2. Lumbar radiculopathy       MDM  rx prednisone 50 mg , 6 rx-hydrocodone, 20 ice        Worthy Rancher, [place] 10/18/11 1944

## 2011-10-18 NOTE — Discharge Instructions (Signed)
Back Pain, Adult Low back pain is very common. About 1 in 5 people have back pain.The cause of low back pain is rarely dangerous. The pain often gets better over time.About half of people with a sudden onset of back pain feel better in just 2 weeks. About 8 in 10 people feel better by 6 weeks.  CAUSES Some common causes of back pain include:  Strain of the muscles or ligaments supporting the spine.   Wear and tear (degeneration) of the spinal discs.   Arthritis.   Direct injury to the back.  DIAGNOSIS Most of the time, the direct cause of low back pain is not known.However, back pain can be treated effectively even when the exact cause of the pain is unknown.Answering your caregiver's questions about your overall health and symptoms is one of the most accurate ways to make sure the cause of your pain is not dangerous. If your caregiver needs more information, he or she may order lab work or imaging tests (X-rays or MRIs).However, even if imaging tests show changes in your back, this usually does not require surgery. HOME CARE INSTRUCTIONS For many people, back pain returns.Since low back pain is rarely dangerous, it is often a condition that people can learn to manageon their own.   Remain active. It is stressful on the back to sit or stand in one place. Do not sit, drive, or stand in one place for more than 30 minutes at a time. Take short walks on level surfaces as soon as pain allows.Try to increase the length of time you walk each day.   Do not stay in bed.Resting more than 1 or 2 days can delay your recovery.   Do not avoid exercise or work.Your body is made to move.It is not dangerous to be active, even though your back may hurt.Your back will likely heal faster if you return to being active before your pain is gone.   Pay attention to your body when you bend and lift. Many people have less discomfortwhen lifting if they bend their knees, keep the load close to their  bodies,and avoid twisting. Often, the most comfortable positions are those that put less stress on your recovering back.   Find a comfortable position to sleep. Use a firm mattress and lie on your side with your knees slightly bent. If you lie on your back, put a pillow under your knees.   Only take over-the-counter or prescription medicines as directed by your caregiver. Over-the-counter medicines to reduce pain and inflammation are often the most helpful.Your caregiver may prescribe muscle relaxant drugs.These medicines help dull your pain so you can more quickly return to your normal activities and healthy exercise.   Put ice on the injured area.   Put ice in a plastic bag.   Place a towel between your skin and the bag.   Leave the ice on for 15 to 20 minutes, 3 to 4 times a day for the first 2 to 3 days. After that, ice and heat may be alternated to reduce pain and spasms.   Ask your caregiver about trying back exercises and gentle massage. This may be of some benefit.   Avoid feeling anxious or stressed.Stress increases muscle tension and can worsen back pain.It is important to recognize when you are anxious or stressed and learn ways to manage it.Exercise is a great option.  SEEK MEDICAL CARE IF:  You have pain that is not relieved with rest or medicine.   You have   pain that does not improve in 1 week.   You have new symptoms.   You are generally not feeling well.  SEEK IMMEDIATE MEDICAL CARE IF:   You have pain that radiates from your back into your legs.   You develop new bowel or bladder control problems.   You have unusual weakness or numbness in your arms or legs.   You develop nausea or vomiting.   You develop abdominal pain.   You feel faint.  Document Released: 05/07/2005 Document Revised: 04/26/2011 Document Reviewed: 09/25/2010 [facility] Patient Information 2012 [place], [place].Cryotherapy Cryotherapy means treatment with cold. Ice or gel packs can be  used to reduce both pain and swelling. Ice is the most helpful within the first 24 to 48 hours after an injury or flareup from overusing a muscle or joint. Sprains, strains, spasms, burning pain, shooting pain, and aches can all be eased with ice. Ice can also be used when recovering from surgery. Ice is effective, has very few side effects, and is safe for most people to use. PRECAUTIONS  Ice is not a safe treatment option for people with:  Raynaud's phenomenon. This is a condition affecting small blood vessels in the extremities. Exposure to cold may cause your problems to return.   Cold hypersensitivity. There are many forms of cold hypersensitivity, including:   Cold urticaria. Red, itchy hives appear on the skin when the tissues begin to warm after being iced.   Cold erythema. This is a red, itchy rash caused by exposure to cold.   Cold hemoglobinuria. Red blood cells break down when the tissues begin to warm after being iced. The hemoglobin that carry oxygen are passed into the urine because they cannot combine with blood proteins fast enough.   Numbness or altered sensitivity in the area being iced.  If you have any of the following conditions, do not use ice until you have discussed cryotherapy with your caregiver:  Heart conditions, such as arrhythmia, angina, or chronic heart disease.   High blood pressure.   Healing wounds or open skin in the area being iced.   Current infections.   Rheumatoid arthritis.   Poor circulation.   Diabetes.  Ice slows the blood flow in the region it is applied. This is beneficial when trying to stop inflamed tissues from spreading irritating chemicals to surrounding tissues. However, if you expose your skin to cold temperatures for too long or without the proper protection, you can damage your skin or nerves. Watch for signs of skin damage due to cold. HOME CARE INSTRUCTIONS Follow these tips to use ice and cold packs safely.  Place a dry or  damp towel between the ice and skin. A damp towel will cool the skin more quickly, so you may need to shorten the time that the ice is used.   For a more rapid response, add gentle compression to the ice.   Ice for no more than 10 to 20 minutes at a time. The bonier the area you are icing, the less time it will take to get the benefits of ice.   Check your skin after 5 minutes to make sure there are no signs of a poor response to cold or skin damage.   Rest 20 minutes or more in between uses.   Once your skin is numb, you can end your treatment. You can test numbness by very lightly touching your skin. The touch should be so light that you do not see the skin dimple from  the pressure of your fingertip. When using ice, most people will feel these normal sensations in this order: cold, burning, aching, and numbness.   Do not use ice on someone who cannot communicate their responses to pain, such as small children or people with dementia.  HOW TO MAKE AN ICE PACK Ice packs are the most common way to use ice therapy. Other methods include ice massage, ice baths, and cryo-sprays. Muscle creams that cause a cold, tingly feeling do not offer the same benefits that ice offers and should not be used as a substitute unless recommended by your caregiver. To make an ice pack, do one of the following:  Place crushed ice or a bag of frozen vegetables in a sealable plastic bag. Squeeze out the excess air. Place this bag inside another plastic bag. Slide the bag into a pillowcase or place a damp towel between your skin and the bag.   Mix 3 parts water with 1 part rubbing alcohol. Freeze the mixture in a sealable plastic bag. When you remove the mixture from the freezer, it will be slushy. Squeeze out the excess air. Place this bag inside another plastic bag. Slide the bag into a pillowcase or place a damp towel between your skin and the bag.  SEEK MEDICAL CARE IF:  You develop white spots on your skin. This  may give the skin a blotchy (mottled) appearance.   Your skin turns blue or pale.   Your skin becomes waxy or hard.   Your swelling gets worse.  MAKE SURE YOU:   Understand these instructions.   Will watch your condition.   Will get help right away if you are not doing well or get worse.  Document Released: 01/01/2011 Document Revised: 04/26/2011 Document Reviewed: 01/01/2011 [facility] Patient Information 2012 [place], [place].   Take the meds as directed.  Apply ice several times daily to lower back.  Call dr. Jeral Fruit tomorrow for further instructions.

## 2011-10-18 NOTE — ED Provider Notes (Signed)
Medical screening examination/treatment/procedure(s) were performed by non-physician practitioner and as supervising physician I was immediately available for consultation/collaboration.   Ayham Word, MD 10/18/11 2035 
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
## 2011-10-18 NOTE — ED Notes (Signed)
Pt. Requesting pain meds, EDPA notified

## 2012-09-10 ENCOUNTER — Telehealth: Payer: Self-pay | Admitting: Family Medicine

## 2012-09-10 NOTE — Telephone Encounter (Signed)
Patient states she needs Dr. Lorin Picket to prescribe an antibiotic.  She believes she has a sinus infection.  [facility].  Please call patient

## 2012-09-10 NOTE — Telephone Encounter (Signed)
Advised patient to come in for office visit before antibiotic can be prescribed. Patient stated that she will call back for an appointment tomorrow.

## 2012-09-25 ENCOUNTER — Other Ambulatory Visit (HOSPITAL_COMMUNITY): Payer: Self-pay | Admitting: Family Medicine

## 2013-08-31 ENCOUNTER — Inpatient Hospital Stay (HOSPITAL_COMMUNITY)
Admission: AD | Admit: 2013-08-31 | Discharge: 2013-09-01 | Disposition: A | Discharge status: Home / Self Care | Payer: 59 | Source: Ambulatory Visit | Attending: Obstetrics and Gynecology | Admitting: Obstetrics and Gynecology

## 2013-08-31 DIAGNOSIS — B373 Candidiasis of vulva and vagina: Secondary | ICD-10-CM

## 2013-08-31 DIAGNOSIS — A5901 Trichomonal vulvovaginitis: Secondary | ICD-10-CM

## 2013-08-31 DIAGNOSIS — F329 Major depressive disorder, single episode, unspecified: Secondary | ICD-10-CM | POA: Insufficient documentation

## 2013-08-31 DIAGNOSIS — F3289 Other specified depressive episodes: Secondary | ICD-10-CM | POA: Insufficient documentation

## 2013-08-31 DIAGNOSIS — B3731 Acute candidiasis of vulva and vagina: Secondary | ICD-10-CM | POA: Insufficient documentation

## 2013-08-31 DIAGNOSIS — Z90710 Acquired absence of both cervix and uterus: Secondary | ICD-10-CM | POA: Insufficient documentation

## 2013-08-31 DIAGNOSIS — M5430 Sciatica, unspecified side: Secondary | ICD-10-CM | POA: Insufficient documentation

## 2013-08-31 DIAGNOSIS — K219 Gastro-esophageal reflux disease without esophagitis: Secondary | ICD-10-CM | POA: Insufficient documentation

## 2013-08-31 DIAGNOSIS — F411 Generalized anxiety disorder: Secondary | ICD-10-CM | POA: Insufficient documentation

## 2013-08-31 NOTE — MAU Note (Signed)
Hx of complete hysterectomy in 1999.  Took a one day treatment for possible yeast infection on 4/11. Yesterday had diarrhea and abd pain. Today having a brownish reddish colored discharge, continues to have vaginal irritation.

## 2013-09-01 ENCOUNTER — Encounter (HOSPITAL_COMMUNITY): Payer: Self-pay

## 2013-09-01 DIAGNOSIS — B373 Candidiasis of vulva and vagina: Secondary | ICD-10-CM

## 2013-09-01 DIAGNOSIS — B3731 Acute candidiasis of vulva and vagina: Secondary | ICD-10-CM

## 2013-09-01 LAB — GC/CHLAMYDIA PROBE AMP
CT PROBE, AMP APTIMA: NEGATIVE
GC PROBE AMP APTIMA: NEGATIVE

## 2013-09-01 LAB — WET PREP, GENITAL
Clue Cells Wet Prep HPF POC: NONE SEEN
Yeast Wet Prep HPF POC: NONE SEEN

## 2013-09-01 MED ORDER — FLUCONAZOLE 150 MG PO TABS
150.0000 mg | ORAL_TABLET | Freq: Once | ORAL | Status: DC
Start: 2013-09-01 — End: 2014-05-31

## 2013-09-01 MED ORDER — METRONIDAZOLE 500 MG PO TABS
2000.0000 mg | ORAL_TABLET | Freq: Once | ORAL | Status: AC
  Administered 2013-09-01: 2000 mg via ORAL
  Filled 2013-09-01: qty 4

## 2013-09-01 MED ORDER — FLUCONAZOLE 150 MG PO TABS
150.0000 mg | ORAL_TABLET | Freq: Once | ORAL | Status: AC
  Administered 2013-09-01: 150 mg via ORAL
  Filled 2013-09-01: qty 1

## 2013-09-01 NOTE — MAU Provider Note (Signed)
History     CSN: [id number]  Arrival date and time: 08/31/13 2237   First Provider Initiated Contact with Patient 09/01/13 0029      Chief Complaint  Patient presents with  . Vaginal Bleeding  . Vaginal Discharge   Vaginal Bleeding The patient's primary symptoms include a vaginal discharge.  Vaginal Discharge The patient's primary symptoms include a vaginal discharge.    [name] [name] is a 48 y.o. who presents today with vaginal spotting. She states that she had a complete hysterectomy in 1999. On Saturday she thought she had a yeast infection, and she used an OTC 1 day treatment. After she used that treatment she had some discomfort, and then had some brown spotting. She states that she has been using Estratest for hormone replacement.   Past Medical History  Diagnosis Date  . Anxiety   . GERD (gastroesophageal reflux disease)   . Hx of cholecystectomy   . History of hysterectomy   . History of oophorectomy   . Depression   . Sciatica neuralgia, right     Past Surgical History  Procedure Laterality Date  . Hystercetomy      History reviewed. No pertinent family history.  History  Substance Use Topics  . Smoking status: Never Smoker   . Smokeless tobacco: Not on file  . Alcohol Use: No    Allergies:  Allergies  Allergen Reactions  . Sulfa Antibiotics     Prescriptions prior to admission  Medication Sig Dispense Refill  . HYDROcodone-acetaminophen (NORCO) 5-325 MG per tablet One tab po q 4-6 hrs prn pain  20 tablet  0    Review of Systems  Genitourinary: Positive for vaginal bleeding and vaginal discharge.   Physical Exam   Blood pressure 130/76, pulse 82, temperature 98 F (36.7 C), temperature source Oral, resp. rate 16, height 5\' 2"  (1.575 m), weight 68.856 kg (151 lb 12.8 oz), last menstrual period 05/21/1997.  Physical Exam  Nursing note and vitals reviewed. Constitutional: She is oriented to person, place, and time. She appears  well-developed and well-nourished. No distress.  Cardiovascular: Normal rate.   Respiratory: Effort normal.  GI: Soft. There is no tenderness.  Genitourinary:   External: no lesion Vagina: large amount of thick, clumpy, white discharge  Cervix: SA Uterus: SA Adnexa: SA   Neurological: She is alert and oriented to person, place, and time.  Skin: Skin is warm and dry.  Psychiatric: She has a normal mood and affect.    MAU Course  Procedures  Results for orders placed during the hospital encounter of 08/31/13 (from the past 24 hour(s))  WET PREP, GENITAL     Status: Abnormal   Collection Time    09/01/13 12:35 AM      Result Value Ref Range   Yeast Wet Prep HPF POC NONE SEEN  NONE SEEN   Trich, Wet Prep MODERATE (*) NONE SEEN   Clue Cells Wet Prep HPF POC NONE SEEN  NONE SEEN   WBC, Wet Prep HPF POC MODERATE (*) NONE SEEN     0047: D/W Dr. Tenny Crawoss, will treat with diflucan here in MAU and give another to take in a few days at home. Patient can call the office if sx have not improved with diflucan.   Assessment and Plan   1. Yeast infection involving the vagina and surrounding area   2. Trichomonal vulvovaginitis    Treated with Diflucan and Flagyl here in MAU Call the office to FU as needed Return to  MAU as needed GC/CT pending  Discussed partner treatment   Tawnya CrookHeather Donovan Hogan 09/01/2013, 12:39 AM

## 2013-09-01 NOTE — Discharge Instructions (Signed)
Candidal Vulvovaginitis °Candidal vulvovaginitis is an infection of the vagina and vulva. The vulva is the skin around the opening of the vagina. This may cause itching and discomfort in and around the vagina.  °HOME CARE °· Only take medicine as told by your doctor. °· Do not have sex (intercourse) until the infection is healed or as told by your doctor. °· Practice safe sex. °· Tell your sex partner about your infection. °· Do not douche or use tampons. °· Wear cotton underwear. Do not wear tight pants or panty hose. °· Eat yogurt. This may help treat and prevent yeast infections. °GET HELP RIGHT AWAY IF:  °· You have a fever. °· Your problems get worse during treatment or do not get better in 3 days. °· You have discomfort, irritation, or itching in your vagina or vulva area. °· You have pain after sex. °· You start to get belly (abdominal) pain. °MAKE SURE YOU: °· Understand these instructions. °· Will watch your condition. °· Will get help right away if you are not doing well or get worse. °Document Released: 08/03/2008 Document Revised: 07/30/2011 Document Reviewed: 08/03/2008 °[employer]® Patient Information ©2014 [employer]. ° ° °Trichomoniasis °Trichomoniasis is an infection, caused by the Trichomonas organism, that affects both women and men. In women, the outer female genitalia and the vagina are affected. In men, the penis is mainly affected, but the prostate and other reproductive organs can also be involved. Trichomoniasis is a sexually transmitted disease (STD) and is most often passed to another person through sexual contact. The majority of people who get trichomoniasis do so from a sexual encounter and are also at risk for other STDs. °CAUSES  °· Sexual intercourse with an infected partner. °· It can be present in swimming pools or hot tubs. °SYMPTOMS  °· Abnormal gray-green frothy vaginal discharge in women. °· Vaginal itching and irritation in women. °· Itching and irritation of the area outside  the vagina in women. °· Penile discharge with or without pain in males. °· Inflammation of the urethra (urethritis), causing painful urination. °· Bleeding after sexual intercourse. °RELATED COMPLICATIONS °· Pelvic inflammatory disease. °· Infection of the uterus (endometritis). °· Infertility. °· Tubal (ectopic) pregnancy. °· It can be associated with other STDs, including gonorrhea and chlamydia, hepatitis B, and HIV. °COMPLICATIONS DURING PREGNANCY °· Early (premature) delivery. °· Premature rupture of the membranes (PROM). °· Low birth weight. °DIAGNOSIS  °· Visualization of Trichomonas under the microscope from the vagina discharge. °· Ph of the vagina greater than 4.5, tested with a test tape. °· Trich Rapid Test. °· Culture of the organism, but this is not usually needed. °· It may be found on a Pap test. °· Having a "strawberry cervix,"which means the cervix looks very red like a strawberry. °TREATMENT  °· You may be given medication to fight the infection. Inform your caregiver if you could be or are pregnant. Some medications used to treat the infection should not be taken during pregnancy. °· Over-the-counter medications or creams to decrease itching or irritation may be recommended. °· Your sexual partner will need to be treated if infected. °HOME CARE INSTRUCTIONS  °· Take all medication prescribed by your caregiver. °· Take over-the-counter medication for itching or irritation as directed by your caregiver. °· Do not have sexual intercourse while you have the infection. °· Do not douche or wear tampons. °· Discuss your infection with your partner, as your partner may have acquired the infection from you. Or, your partner may have been   the person who transmitted the infection to you. °· Have your sex partner examined and treated if necessary. °· Practice safe, informed, and protected sex. °· See your caregiver for other STD testing. °SEEK MEDICAL CARE IF:  °· You still have symptoms after you finish the  medication. °· You have an oral temperature above 102° F (38.9° C). °· You develop belly (abdominal) pain. °· You have pain when you urinate. °· You have bleeding after sexual intercourse. °· You develop a rash. °· The medication makes you sick or makes you throw up (vomit). ° ° °Document Released: 10/31/2000 Document Revised: 07/30/2011 Document Reviewed: 11/26/2008 °ExitCare® Patient Information ©2014 [employer]. ° °

## 2014-05-31 ENCOUNTER — Emergency Department (HOSPITAL_COMMUNITY)
Admission: EM | Admit: 2014-05-31 | Discharge: 2014-05-31 | Disposition: A | Discharge status: Home / Self Care | Payer: 59 | Attending: Emergency Medicine | Admitting: Emergency Medicine

## 2014-05-31 ENCOUNTER — Encounter (HOSPITAL_COMMUNITY): Payer: Self-pay | Admitting: Emergency Medicine

## 2014-05-31 ENCOUNTER — Emergency Department (HOSPITAL_COMMUNITY): Payer: 59

## 2014-05-31 DIAGNOSIS — Z9049 Acquired absence of other specified parts of digestive tract: Secondary | ICD-10-CM | POA: Insufficient documentation

## 2014-05-31 DIAGNOSIS — Z90710 Acquired absence of both cervix and uterus: Secondary | ICD-10-CM | POA: Diagnosis not present

## 2014-05-31 DIAGNOSIS — R109 Unspecified abdominal pain: Secondary | ICD-10-CM

## 2014-05-31 DIAGNOSIS — N12 Tubulo-interstitial nephritis, not specified as acute or chronic: Secondary | ICD-10-CM | POA: Diagnosis not present

## 2014-05-31 DIAGNOSIS — F329 Major depressive disorder, single episode, unspecified: Secondary | ICD-10-CM | POA: Diagnosis not present

## 2014-05-31 DIAGNOSIS — Z79899 Other long term (current) drug therapy: Secondary | ICD-10-CM | POA: Insufficient documentation

## 2014-05-31 DIAGNOSIS — Z8739 Personal history of other diseases of the musculoskeletal system and connective tissue: Secondary | ICD-10-CM | POA: Diagnosis not present

## 2014-05-31 DIAGNOSIS — F419 Anxiety disorder, unspecified: Secondary | ICD-10-CM | POA: Diagnosis not present

## 2014-05-31 DIAGNOSIS — Z8719 Personal history of other diseases of the digestive system: Secondary | ICD-10-CM | POA: Insufficient documentation

## 2014-05-31 LAB — CBC WITH DIFFERENTIAL/PLATELET
BASOS ABS: 0 10*3/uL (ref 0.0–0.1)
Basophils Relative: 0 % (ref 0–1)
EOS ABS: 0.1 10*3/uL (ref 0.0–0.7)
EOS PCT: 1 % (ref 0–5)
HCT: 41.5 % (ref 36.0–46.0)
HEMOGLOBIN: 14.2 g/dL (ref 12.0–15.0)
LYMPHS PCT: 20 % (ref 12–46)
Lymphs Abs: 1.7 10*3/uL (ref 0.7–4.0)
MCH: 32.4 pg (ref 26.0–34.0)
MCHC: 34.2 g/dL (ref 30.0–36.0)
MCV: 94.7 fL (ref 78.0–100.0)
MONO ABS: 0.7 10*3/uL (ref 0.1–1.0)
Monocytes Relative: 9 % (ref 3–12)
NEUTROS ABS: 5.7 10*3/uL (ref 1.7–7.7)
Neutrophils Relative %: 70 % (ref 43–77)
Platelets: 207 10*3/uL (ref 150–400)
RBC: 4.38 MIL/uL (ref 3.87–5.11)
RDW: 12.4 % (ref 11.5–15.5)
WBC: 8.3 10*3/uL (ref 4.0–10.5)

## 2014-05-31 LAB — BASIC METABOLIC PANEL
ANION GAP: 7 (ref 5–15)
BUN: 15 mg/dL (ref 6–23)
CO2: 27 mmol/L (ref 19–32)
CREATININE: 0.98 mg/dL (ref 0.50–1.10)
Calcium: 9.7 mg/dL (ref 8.4–10.5)
Chloride: 104 mEq/L (ref 96–112)
GFR calc Af Amer: 78 mL/min — ABNORMAL LOW (ref >90)
GFR calc non Af Amer: 68 mL/min — ABNORMAL LOW (ref >90)
GLUCOSE: 98 mg/dL (ref 70–99)
POTASSIUM: 3.7 mmol/L (ref 3.5–5.1)
SODIUM: 138 mmol/L (ref 135–145)

## 2014-05-31 LAB — URINALYSIS, ROUTINE W REFLEX MICROSCOPIC
BILIRUBIN URINE: NEGATIVE
Glucose, UA: NEGATIVE mg/dL
KETONES UR: NEGATIVE mg/dL
Nitrite: POSITIVE — AB
PH: 6 (ref 5.0–8.0)
Protein, ur: 30 mg/dL — AB
UROBILINOGEN UA: 0.2 mg/dL (ref 0.0–1.0)

## 2014-05-31 LAB — URINE MICROSCOPIC-ADD ON

## 2014-05-31 MED ORDER — HYDROMORPHONE HCL 1 MG/ML IJ SOLN
0.5000 mg | Freq: Once | INTRAMUSCULAR | Status: AC
  Administered 2014-05-31: 0.5 mg via INTRAVENOUS
  Filled 2014-05-31: qty 1

## 2014-05-31 MED ORDER — HYDROMORPHONE HCL 1 MG/ML IJ SOLN
1.0000 mg | Freq: Once | INTRAMUSCULAR | Status: AC
  Administered 2014-05-31: 1 mg via INTRAVENOUS
  Filled 2014-05-31: qty 1

## 2014-05-31 MED ORDER — DIPHENHYDRAMINE HCL 50 MG/ML IJ SOLN
12.5000 mg | Freq: Once | INTRAMUSCULAR | Status: AC
  Administered 2014-05-31: 12.5 mg via INTRAVENOUS
  Filled 2014-05-31: qty 1

## 2014-05-31 MED ORDER — DEXTROSE 5 % IV SOLN
1.0000 g | Freq: Once | INTRAVENOUS | Status: AC
  Administered 2014-05-31: 1 g via INTRAVENOUS
  Filled 2014-05-31: qty 10

## 2014-05-31 MED ORDER — ONDANSETRON HCL 4 MG/2ML IJ SOLN
4.0000 mg | Freq: Once | INTRAMUSCULAR | Status: AC
  Administered 2014-05-31: 4 mg via INTRAVENOUS
  Filled 2014-05-31: qty 2

## 2014-05-31 MED ORDER — HYDROCODONE-ACETAMINOPHEN 5-325 MG PO TABS: ORAL_TABLET | ORAL | Status: DC

## 2014-05-31 MED ORDER — KETOROLAC TROMETHAMINE 30 MG/ML IJ SOLN
30.0000 mg | Freq: Once | INTRAMUSCULAR | Status: AC
  Administered 2014-05-31: 30 mg via INTRAVENOUS
  Filled 2014-05-31: qty 1

## 2014-05-31 MED ORDER — CEPHALEXIN 500 MG PO CAPS: 500.0000 mg | ORAL_CAPSULE | Freq: Four times a day (QID) | ORAL | Status: DC

## 2014-05-31 NOTE — Discharge Instructions (Signed)
Pyelonephritis, Adult °Pyelonephritis is a kidney infection. A kidney infection can happen quickly, or it can last for a long time. °HOME CARE  °· Take your medicine (antibiotics) as told. Finish it even if you start to feel better. °· Keep all doctor visits as told. °· Drink enough fluids to keep your pee (urine) clear or pale yellow. °· Only take medicine as told by your doctor. °GET HELP RIGHT AWAY IF:  °· You have a fever or lasting symptoms for more than 2-3 days. °· You have a fever and your symptoms suddenly get worse. °· You cannot take your medicine or drink fluids as told. °· You have chills and shaking. °· You feel very weak or pass out (faint). °· You do not feel better after 2 days. °MAKE SURE YOU: °· Understand these instructions. °· Will watch your condition. °· Will get help right away if you are not doing well or get worse. °Document Released: 06/14/2004 Document Revised: 11/06/2011 Document Reviewed: 10/25/2010 °[employer]® Patient Information ©2015 [employer]. This information is not intended to replace advice given to you by your health care provider. Make sure you discuss any questions you have with your health care provider. ° °

## 2014-05-31 NOTE — ED Notes (Signed)
abdominal cramping on left side,  scant amount of urine, pt is very uncomfortable in pain

## 2014-06-01 ENCOUNTER — Other Ambulatory Visit: Payer: Self-pay | Admitting: Obstetrics and Gynecology

## 2014-06-01 NOTE — ED Provider Notes (Signed)
CSN: [id number]     Arrival date & time 05/31/14  5784 History   First MD Initiated Contact with Patient 05/31/14 1041     Chief Complaint  Patient presents with  . Abdominal Cramping     (Consider location/radiation/quality/duration/timing/severity/associated sxs/prior Treatment) HPI [name] [name] is a 49 y.o. female who presents to the Emergency Department complaining of left flank pain and difficulty urinating for several hours.  She states that pain in severe and "comes in waves".  She reports that her urine appears very dark.  She states that nothing makes the pain better or worse.  She reports some nausea, but denies vomiting, diarrhea, fever, or history of kidney stones. Has hx of complete hysterectomy.     Past Medical History  Diagnosis Date  . Anxiety   . GERD (gastroesophageal reflux disease)   . Hx of cholecystectomy   . History of hysterectomy   . History of oophorectomy   . Depression   . Sciatica neuralgia, right    Past Surgical History  Procedure Laterality Date  . Hystercetomy     No family history on file. History  Substance Use Topics  . Smoking status: Current Every Day Smoker -- 0.50 packs/day  . Smokeless tobacco: Not on file  . Alcohol Use: No   OB History    No data available     Review of Systems  Constitutional: Negative for fever, chills, activity change and appetite change.  Respiratory: Negative for chest tightness and shortness of breath.   Gastrointestinal: Positive for nausea. Negative for vomiting, abdominal pain and diarrhea.  Genitourinary: Positive for dysuria, urgency, flank pain and difficulty urinating. Negative for frequency, hematuria, decreased urine volume, vaginal bleeding and vaginal discharge.  Musculoskeletal: Negative for back pain.  Skin: Negative for rash.  Neurological: Negative for dizziness, weakness and numbness.  Hematological: Negative for adenopathy.  Psychiatric/Behavioral: Negative for confusion.  All  other systems reviewed and are negative.     Allergies  Sulfa antibiotics  Home Medications   Prior to Admission medications   Medication Sig Start Date End Date Taking? Authorizing Provider  ALPRAZolam Prudy Feeler) 1 MG tablet Take 1 mg by mouth 4 (four) times daily as needed for anxiety or sleep.  04/28/14  Yes Historical Provider, MD  estrogen-methylTESTOSTERone (ESTRATEST) 1.25-2.5 MG per tablet Take 1 tablet by mouth daily. 04/08/14  Yes Historical Provider, MD  hydrochlorothiazide (HYDRODIURIL) 25 MG tablet Take 1 tablet by mouth daily. 04/04/14  Yes Historical Provider, MD  cephALEXin (KEFLEX) 500 MG capsule Take 1 capsule (500 mg total) by mouth 4 (four) times daily. For 7 days 05/31/14   Kimyah Frein L. Reo Portela, PA-C  HYDROcodone-acetaminophen (NORCO/VICODIN) 5-325 MG per tablet Take one-two tabs po q 4-6 hrs prn pain 05/31/14   Cheryllynn Sarff L. Brazil Voytko, PA-C   BP 133/53 mmHg  Pulse 56  Temp(Src) 98.1 F (36.7 C) (Oral)  Resp 18  Ht  (1.575 m)  Wt 140 lb (63.504 kg)  BMI 25.60 kg/m2  SpO2 100%  LMP 05/21/1997 Physical Exam  Constitutional: She is oriented to person, place, and time. She appears well-developed and well-nourished. No distress.  HENT:  Head: Normocephalic and atraumatic.  Mouth/Throat: Oropharynx is clear and moist.  Neck: Normal range of motion. Neck supple.  Cardiovascular: Normal rate, regular rhythm, normal heart sounds and intact distal pulses.   No murmur heard. Pulmonary/Chest: Effort normal and breath sounds normal. No respiratory distress.  Abdominal: Soft. Normal appearance and bowel sounds are normal. She exhibits no distension  and no mass. There is no hepatosplenomegaly. There is tenderness in the suprapubic area. There is CVA tenderness. There is no rebound and no guarding.    Musculoskeletal: Normal range of motion. She exhibits no edema.  Neurological: She is alert and oriented to person, place, and time. She exhibits normal muscle tone. Coordination  normal.  Skin: Skin is warm and dry.  Nursing note and vitals reviewed.   ED Course  Procedures (including critical care time) Labs Review Labs Reviewed  BASIC METABOLIC PANEL - Abnormal; Notable for the following:    GFR calc non Af Amer 68 (*)    GFR calc Af Amer 78 (*)    All other components within normal limits  URINALYSIS, ROUTINE W REFLEX MICROSCOPIC - Abnormal; Notable for the following:    APPearance HAZY (*)    Specific Gravity, Urine >1.030 (*)    Hgb urine dipstick MODERATE (*)    Protein, ur 30 (*)    Nitrite POSITIVE (*)    Leukocytes, UA SMALL (*)    All other components within normal limits  URINE MICROSCOPIC-ADD ON - Abnormal; Notable for the following:    Squamous Epithelial / LPF FEW (*)    Bacteria, UA MANY (*)    All other components within normal limits  URINE CULTURE  CBC WITH DIFFERENTIAL    Imaging Review Ct Renal Stone Study  05/31/2014   CLINICAL DATA:  Left-sided abdominal pain and cramping beginning 05/30/2014. Decreased urine output.  EXAM: CT ABDOMEN AND PELVIS WITHOUT CONTRAST  TECHNIQUE: Multidetector CT imaging of the abdomen and pelvis was performed following the standard protocol without IV contrast.  COMPARISON:  CT abdomen and pelvis 12/05/2006.  FINDINGS: Mild dependent atelectasis is seen in the lung bases. No pleural or pericardial effusion.  There are no renal or ureteral stones and no hydronephrosis on the right or left. The kidneys appear normal. The patient is status post cholecystectomy. The liver, spleen, adrenal glands and pancreas appear normal. The patient is status post hysterectomy. The stomach, small and large bowel and appendix are unremarkable. No bony abnormality is identified.  IMPRESSION: Negative for urinary tract stone.  No acute finding.  Status post cholecystectomy and hysterectomy.   Electronically Signed   By: Drusilla Kannerhomas  Dalessio M.D.   On: 05/31/2014 12:03     EKG Interpretation None     Urine culture pending  MDM     Final diagnoses:  Flank pain  Pyelonephritis    Pt with worsening left flank pain and CVA tenderness , non-toxic appearing.  No fever or vomiting.  CT scan negative for kidney stone.  Likely early pyelonephritis.  She is feeling better, VSS.  She appears stable for d/c and agrees to close PMD f/u .  I have advised her to return to ED for any worsening sx's and she agrees to plan.  Rocephin given here and rx for keflex and vicodin.      Harim Bi L. Trisha Mangleriplett, PA-C 06/01/14 2140  Vanetta MuldersScott Zackowski, MD 06/02/14 [phone]

## 2014-06-02 LAB — URINE CULTURE

## 2014-06-03 ENCOUNTER — Telehealth (HOSPITAL_BASED_OUTPATIENT_CLINIC_OR_DEPARTMENT_OTHER): Payer: Self-pay | Admitting: Emergency Medicine

## 2014-06-03 NOTE — Progress Notes (Signed)
ED Antimicrobial Stewardship Positive Culture Follow Up   [name] FredericksonStacey B [name] is an 49 y.o. female who [facility] with a chief complaint of  Chief Complaint  Patient presents with  . Abdominal Cramping    Recent Results (from the past 720 hour(s))  Urine culture     Status: None   Collection Time: 05/31/14 10:28 AM  Result Value Ref Range Status   Specimen Description URINE, CLEAN CATCH  Final   Special Requests NONE  Final   Colony Count   Final    >=100,000 COLONIES/ML Performed at [facility]    Culture   Final    ENTEROBACTER AEROGENES Performed at [facility]    Report Status 06/02/2014 FINAL  Final   Organism ID, Bacteria ENTEROBACTER AEROGENES  Final      Susceptibility   Enterobacter aerogenes - MIC*    CEFAZOLIN >=64 RESISTANT Resistant     CEFTRIAXONE <=1 SENSITIVE Sensitive     CIPROFLOXACIN <=0.25 SENSITIVE Sensitive     GENTAMICIN <=1 SENSITIVE Sensitive     LEVOFLOXACIN <=0.12 SENSITIVE Sensitive     NITROFURANTOIN 64 INTERMEDIATE Intermediate     TOBRAMYCIN <=1 SENSITIVE Sensitive     TRIMETH/SULFA <=20 SENSITIVE Sensitive     PIP/TAZO <=4 SENSITIVE Sensitive     * ENTEROBACTER AEROGENES    [x]  Treated with cephalexin, organism resistant to prescribed antimicrobial   New antibiotic prescription: Cipro 250mg  po bid for 7 days  ED Provider: Fayrene HelperBowie Tran, Yaakov GuthriePA-C   [name] 06/03/2014, 11:57 AM Infectious Diseases Pharmacist Phone# [phone]

## 2014-06-03 NOTE — Telephone Encounter (Signed)
Attempted to call patient regarding need to change antibiotic from keflex to cipro 250mg  po bid x 7 days d/t resistant UTI, both numbers appear invalid, [facility]

## 2014-06-09 ENCOUNTER — Encounter: Payer: Self-pay | Admitting: Internal Medicine

## 2014-06-17 ENCOUNTER — Emergency Department (HOSPITAL_COMMUNITY)
Admission: EM | Admit: 2014-06-17 | Discharge: 2014-06-17 | Disposition: A | Discharge status: Home / Self Care | Payer: Commercial Managed Care - HMO | Attending: Emergency Medicine | Admitting: Emergency Medicine

## 2014-06-17 ENCOUNTER — Encounter (HOSPITAL_COMMUNITY): Payer: Self-pay | Admitting: Emergency Medicine

## 2014-06-17 DIAGNOSIS — Z8719 Personal history of other diseases of the digestive system: Secondary | ICD-10-CM | POA: Diagnosis not present

## 2014-06-17 DIAGNOSIS — Z720 Tobacco use: Secondary | ICD-10-CM | POA: Insufficient documentation

## 2014-06-17 DIAGNOSIS — F419 Anxiety disorder, unspecified: Secondary | ICD-10-CM | POA: Diagnosis not present

## 2014-06-17 DIAGNOSIS — N390 Urinary tract infection, site not specified: Secondary | ICD-10-CM | POA: Diagnosis not present

## 2014-06-17 DIAGNOSIS — Z792 Long term (current) use of antibiotics: Secondary | ICD-10-CM | POA: Insufficient documentation

## 2014-06-17 DIAGNOSIS — Z79899 Other long term (current) drug therapy: Secondary | ICD-10-CM | POA: Insufficient documentation

## 2014-06-17 DIAGNOSIS — F329 Major depressive disorder, single episode, unspecified: Secondary | ICD-10-CM | POA: Diagnosis not present

## 2014-06-17 DIAGNOSIS — Z8739 Personal history of other diseases of the musculoskeletal system and connective tissue: Secondary | ICD-10-CM | POA: Insufficient documentation

## 2014-06-17 DIAGNOSIS — R300 Dysuria: Secondary | ICD-10-CM | POA: Diagnosis present

## 2014-06-17 LAB — COMPREHENSIVE METABOLIC PANEL
ALBUMIN: 3.9 g/dL (ref 3.5–5.2)
ALK PHOS: 62 U/L (ref 39–117)
ALT: 13 U/L (ref 0–35)
ANION GAP: 8 (ref 5–15)
AST: 19 U/L (ref 0–37)
BILIRUBIN TOTAL: 0.7 mg/dL (ref 0.3–1.2)
BUN: 11 mg/dL (ref 6–23)
CO2: 25 mmol/L (ref 19–32)
Calcium: 9 mg/dL (ref 8.4–10.5)
Chloride: 105 mmol/L (ref 96–112)
Creatinine, Ser: 1.04 mg/dL (ref 0.50–1.10)
GFR, EST AFRICAN AMERICAN: 73 mL/min — AB (ref >90)
GFR, EST NON AFRICAN AMERICAN: 63 mL/min — AB (ref >90)
Glucose, Bld: 101 mg/dL — ABNORMAL HIGH (ref 70–99)
Potassium: 3.2 mmol/L — ABNORMAL LOW (ref 3.5–5.1)
Sodium: 138 mmol/L (ref 135–145)
TOTAL PROTEIN: 7 g/dL (ref 6.0–8.3)

## 2014-06-17 LAB — CBC WITH DIFFERENTIAL/PLATELET
BASOS PCT: 0 % (ref 0–1)
Basophils Absolute: 0 10*3/uL (ref 0.0–0.1)
EOS ABS: 0.1 10*3/uL (ref 0.0–0.7)
Eosinophils Relative: 1 % (ref 0–5)
HEMATOCRIT: 39.8 % (ref 36.0–46.0)
Hemoglobin: 13.6 g/dL (ref 12.0–15.0)
Lymphocytes Relative: 21 % (ref 12–46)
Lymphs Abs: 1.8 10*3/uL (ref 0.7–4.0)
MCH: 32.9 pg (ref 26.0–34.0)
MCHC: 34.2 g/dL (ref 30.0–36.0)
MCV: 96.1 fL (ref 78.0–100.0)
MONOS PCT: 7 % (ref 3–12)
Monocytes Absolute: 0.6 10*3/uL (ref 0.1–1.0)
NEUTROS PCT: 71 % (ref 43–77)
Neutro Abs: 6.1 10*3/uL (ref 1.7–7.7)
Platelets: 181 10*3/uL (ref 150–400)
RBC: 4.14 MIL/uL (ref 3.87–5.11)
RDW: 13 % (ref 11.5–15.5)
WBC: 8.7 10*3/uL (ref 4.0–10.5)

## 2014-06-17 LAB — URINALYSIS, ROUTINE W REFLEX MICROSCOPIC
Bilirubin Urine: NEGATIVE
GLUCOSE, UA: NEGATIVE mg/dL
Ketones, ur: NEGATIVE mg/dL
Nitrite: POSITIVE — AB
Protein, ur: 100 mg/dL — AB
SPECIFIC GRAVITY, URINE: 1.02 (ref 1.005–1.030)
Urobilinogen, UA: 1 mg/dL (ref 0.0–1.0)
pH: 6 (ref 5.0–8.0)

## 2014-06-17 LAB — URINE MICROSCOPIC-ADD ON

## 2014-06-17 LAB — LIPASE, BLOOD: LIPASE: 26 U/L (ref 11–59)

## 2014-06-17 MED ORDER — SODIUM CHLORIDE 0.9 % IV BOLUS (SEPSIS)
1000.0000 mL | Freq: Once | INTRAVENOUS | Status: AC
  Administered 2014-06-17: 1000 mL via INTRAVENOUS

## 2014-06-17 MED ORDER — HYDROMORPHONE HCL 1 MG/ML IJ SOLN
0.5000 mg | Freq: Once | INTRAMUSCULAR | Status: AC
  Administered 2014-06-17: 0.5 mg via INTRAVENOUS
  Filled 2014-06-17: qty 1

## 2014-06-17 MED ORDER — PHENAZOPYRIDINE HCL 200 MG PO TABS: 200.0000 mg | ORAL_TABLET | Freq: Three times a day (TID) | ORAL | Status: DC | PRN

## 2014-06-17 MED ORDER — LORAZEPAM 2 MG/ML IJ SOLN
0.5000 mg | Freq: Once | INTRAMUSCULAR | Status: AC
  Administered 2014-06-17: 0.5 mg via INTRAVENOUS
  Filled 2014-06-17: qty 1

## 2014-06-17 MED ORDER — HYDROCODONE-ACETAMINOPHEN 5-325 MG PO TABS: 2.0000 | ORAL_TABLET | ORAL | Status: DC | PRN

## 2014-06-17 MED ORDER — CEPHALEXIN 500 MG PO CAPS: 500.0000 mg | ORAL_CAPSULE | Freq: Four times a day (QID) | ORAL | Status: DC

## 2014-06-17 MED ORDER — DEXTROSE 5 % IV SOLN
1.0000 g | Freq: Once | INTRAVENOUS | Status: AC
  Administered 2014-06-17: 1 g via INTRAVENOUS
  Filled 2014-06-17: qty 10

## 2014-06-17 MED ORDER — HYDROMORPHONE HCL 1 MG/ML IJ SOLN
1.0000 mg | Freq: Once | INTRAMUSCULAR | Status: AC
  Administered 2014-06-17: 1 mg via INTRAVENOUS
  Filled 2014-06-17: qty 1

## 2014-06-17 NOTE — ED Notes (Signed)
Pt c/o urinary urgency but then not able to go or when she finally does urinate its very painful that started back today. Pt states that she was recently treated for kidney infection couple of weeks ago and has completed the antibiotics.

## 2014-06-17 NOTE — Discharge Instructions (Signed)

## 2014-06-17 NOTE — ED Notes (Signed)
Patient states [facility] weeks [facility].  Patient states the s/s she had around that episode were similar to the lower abdominal pain and urinary retention she is experiencing today.  Patient c/o severe (10/10) low abdominal pain and urinary retention.  Patient states she is able to pass urine, but feels as though she cannot empty her bladder.  Patient denies gross hematuria.  Patient's abdomen is mildly distended.  Patient denies N/V/D and fever.

## 2014-06-17 NOTE — ED Notes (Signed)
Bed: WA03 Expected date:  Expected time:  Means of arrival:  Comments: Clean no monitor

## 2014-06-17 NOTE — ED Provider Notes (Signed)
CSN: [id number]     Arrival date & time 06/17/14  1741 History   First MD Initiated Contact with Patient 06/17/14 1824     Chief Complaint  Patient presents with  . Urinary Urgency  . Dysuria   HPI  Patient is a 49 year old with past medical history of anxiety and depression who presents emergency room for evaluation of dysuria and suprapubic abdominal pain which started today. Patient states that approximately 2 days ago she was diagnosed with a urinary tract infection at [facility]. She was given IV antibiotics while she was there and discharged home on Keflex. Patient states that she took her entire course of Keflex and was feeling much better. Today she started developing sharp stabbing abdominal pains are intermittent in her suprapubic area associated with dysuria, urinary frequency and urgency. She denies any vaginal discharge. She states that she thought she may have had a yeast infection so she tried an over-the-counter Monistat despite not having any vaginal discharge. Patient has had a total abdominal hysterectomy.  Past Medical History  Diagnosis Date  . Anxiety   . GERD (gastroesophageal reflux disease)   . Hx of cholecystectomy   . History of hysterectomy   . History of oophorectomy   . Depression   . Sciatica neuralgia, right    Past Surgical History  Procedure Laterality Date  . Abdominal hysterectomy     History reviewed. No pertinent family history. History  Substance Use Topics  . Smoking status: Current Every Day Smoker -- 0.50 packs/day  . Smokeless tobacco: Not on file  . Alcohol Use: No   OB History    No data available     Review of Systems  Constitutional: Negative for fever, chills and fatigue.  Respiratory: Negative for cough, chest tightness and shortness of breath.   Cardiovascular: Negative for chest pain and palpitations.  Gastrointestinal: Positive for abdominal pain. Negative for nausea, vomiting, diarrhea and constipation.   Genitourinary: Positive for dysuria, urgency, frequency and flank pain. Negative for hematuria, decreased urine volume, vaginal bleeding, vaginal discharge, difficulty urinating and vaginal pain.  Musculoskeletal: Negative for back pain.  All other systems reviewed and are negative.     Allergies  Sulfa antibiotics  Home Medications   Prior to Admission medications   Medication Sig Start Date End Date Taking? Authorizing Provider  ALPRAZolam Prudy Feeler) 1 MG tablet Take 1 mg by mouth 4 (four) times daily as needed for anxiety or sleep (anxiety).  04/28/14  Yes Historical Provider, MD  Clotrimazole (MYCELEX OTC EX) Apply 1 application topically daily as needed (potential yeast infection).   Yes Historical Provider, MD  estrogen-methylTESTOSTERone (ESTRATEST) 1.25-2.5 MG per tablet Take 1 tablet by mouth daily. 04/08/14  Yes Historical Provider, MD  hydrochlorothiazide (HYDRODIURIL) 25 MG tablet Take 1 tablet by mouth daily. 04/04/14  Yes Historical Provider, MD  cephALEXin (KEFLEX) 500 MG capsule Take 1 capsule (500 mg total) by mouth 4 (four) times daily. 06/17/14   Ethel Meisenheimer A Forcucci, PA-C  HYDROcodone-acetaminophen (NORCO/VICODIN) 5-325 MG per tablet Take 2 tablets by mouth every 4 (four) hours as needed for moderate pain or severe pain. 06/17/14   Cole Klugh A Forcucci, PA-C  phenazopyridine (PYRIDIUM) 200 MG tablet Take 1 tablet (200 mg total) by mouth 3 (three) times daily as needed for pain. 06/17/14   Dyllan Hughett A Forcucci, PA-C   BP 106/75 mmHg  Pulse 68  Temp(Src) 97.8 F (36.6 C) (Oral)  Resp 14  SpO2 100%  LMP 05/21/1997 Physical Exam  Constitutional:  She is oriented to person, place, and time. She appears well-developed and well-nourished. No distress.  HENT:  Head: Normocephalic and atraumatic.  Mouth/Throat: Oropharynx is clear and moist. No oropharyngeal exudate.  Eyes: Conjunctivae and EOM are normal. Pupils are equal, round, and reactive to light. No scleral icterus.  Neck:  Normal range of motion. Neck supple. No JVD present. No thyromegaly present.  Cardiovascular: Normal rate, regular rhythm, normal heart sounds and intact distal pulses.  Exam reveals no gallop and no friction rub.   No murmur heard. Pulmonary/Chest: Effort normal and breath sounds normal. No respiratory distress. She has no wheezes. She has no rales. She exhibits no tenderness.  Abdominal: Soft. Normal appearance and bowel sounds are normal. She exhibits no distension and no mass. There is tenderness in the suprapubic area. There is no rigidity, no rebound, no guarding, no CVA tenderness, no tenderness at McBurney's point and negative Murphy's sign.  Musculoskeletal: Normal range of motion.  Lymphadenopathy:    She has no cervical adenopathy.  Neurological: She is alert and oriented to person, place, and time. She has normal strength. No cranial nerve deficit or sensory deficit.  Skin: Skin is warm and dry. She is not diaphoretic.  Psychiatric: She has a normal mood and affect. Her behavior is normal. Judgment and thought content normal.  Nursing note and vitals reviewed.   ED Course  Procedures (including critical care time) Labs Review Labs Reviewed  URINALYSIS, ROUTINE W REFLEX MICROSCOPIC - Abnormal; Notable for the following:    APPearance CLOUDY (*)    Hgb urine dipstick MODERATE (*)    Protein, ur 100 (*)    Nitrite POSITIVE (*)    Leukocytes, UA LARGE (*)    All other components within normal limits  COMPREHENSIVE METABOLIC PANEL - Abnormal; Notable for the following:    Potassium 3.2 (*)    Glucose, Bld 101 (*)    GFR calc non Af Amer 63 (*)    GFR calc Af Amer 73 (*)    All other components within normal limits  URINE MICROSCOPIC-ADD ON - Abnormal; Notable for the following:    Bacteria, UA MANY (*)    All other components within normal limits  URINE CULTURE  CBC WITH DIFFERENTIAL/PLATELET  LIPASE, BLOOD    Imaging Review No results found.   EKG  Interpretation None      MDM   Final diagnoses:  UTI (lower urinary tract infection)   Patient is a 49 year old female who presents emergency room for evaluation of dysuria and suprapubic pain. Physical exam reveals tenderness over the suprapubic area of the abdomen with no guarding, rigidity, or peritoneal signs. Patient alert nontoxic-appearing. Vital signs are stable. Urinalysis reveals frank UTI with nitrite positive and leukocyte positive with many bacteria. Urine culture sent given the urinary tract infection in this month. Lipase negative. CBC unremarkable. CMP reveals mild hypokalemia. We'll replace prior to discharge. Patient given 1 g ceftriaxone IV and pain medication with relief of her symptoms. Will discharge patient home with Keflex, Azo Pyridium, and Norco for pain. Patient to follow-up with her PCP. She is requesting referral to urology. I will give her referral. Patient to return for difficulty urinating, flank pain, intractable nausea and vomiting, or fevers. She states understanding and agreement at this time. Patient stable for discharge. Patient discussed with Dr. Jeraldine LootsLockwood who agrees with the above discharge and plan.     Eben Burowourtney A Forcucci, PA-C 06/17/14 2220  Gerhard Munchobert Lockwood, MD 06/17/14 [phone]

## 2014-06-18 ENCOUNTER — Telehealth (HOSPITAL_BASED_OUTPATIENT_CLINIC_OR_DEPARTMENT_OTHER): Payer: Self-pay | Admitting: Emergency Medicine

## 2014-06-18 NOTE — Telephone Encounter (Signed)
Letter returned to sender, lost to followup 

## 2014-06-19 LAB — URINE CULTURE
Colony Count: >=100000
Special Requests: NORMAL

## 2014-06-20 NOTE — Progress Notes (Signed)
ED Antimicrobial Stewardship Positive Culture Follow Up   Tish FredericksonStacey B Williams is an 49 y.o. female who [facility]2014 with a chief complaint of UTI sx Chief Complaint  Patient presents with  . Urinary Urgency  . Dysuria    Recent Results (from the past 720 hour(s))  Urine culture     Status: None   Collection Time: 05/31/14 10:28 AM  Result Value Ref Range Status   Specimen Description URINE, CLEAN CATCH  Final   Special Requests NONE  Final   Colony Count   Final    >=100,000 COLONIES/ML Performed at [facility]    Culture   Final    ENTEROBACTER AEROGENES Performed at [facility]    Report Status 06/02/2014 FINAL  Final   Organism ID, Bacteria ENTEROBACTER AEROGENES  Final      Susceptibility   Enterobacter aerogenes - MIC*    CEFAZOLIN >=64 RESISTANT Resistant     CEFTRIAXONE <=1 SENSITIVE Sensitive     CIPROFLOXACIN <=0.25 SENSITIVE Sensitive     GENTAMICIN <=1 SENSITIVE Sensitive     LEVOFLOXACIN <=0.12 SENSITIVE Sensitive     NITROFURANTOIN 64 INTERMEDIATE Intermediate     TOBRAMYCIN <=1 SENSITIVE Sensitive     TRIMETH/SULFA <=20 SENSITIVE Sensitive     PIP/TAZO <=4 SENSITIVE Sensitive     * ENTEROBACTER AEROGENES  Urine culture     Status: None   Collection Time: 06/17/14  7:36 PM  Result Value Ref Range Status   Specimen Description URINE, CLEAN CATCH  Final   Special Requests Normal  Final   Colony Count   Final    >=100,000 COLONIES/ML Performed at [facility]    Culture   Final    ENTEROBACTER AEROGENES Performed at [facility]    Report Status 06/19/2014 FINAL  Final   Organism ID, Bacteria ENTEROBACTER AEROGENES  Final      Susceptibility   Enterobacter aerogenes - MIC*    CEFAZOLIN >=64 RESISTANT Resistant     CEFTRIAXONE <=1 SENSITIVE Sensitive     CIPROFLOXACIN <=0.25 SENSITIVE Sensitive     GENTAMICIN <=1 SENSITIVE Sensitive     LEVOFLOXACIN <=0.12 SENSITIVE Sensitive    NITROFURANTOIN 64 INTERMEDIATE Intermediate     TOBRAMYCIN <=1 SENSITIVE Sensitive     TRIMETH/SULFA <=20 SENSITIVE Sensitive     PIP/TAZO <=4 SENSITIVE Sensitive     * ENTEROBACTER AEROGENES    [x]  Treated with Keflex, organism resistant to prescribed antimicrobial  New antibiotic prescription: Cefpodoxime 100 mg bid x 7d  ED Provider: Renne CriglerJoshua Geiple, PA-C  Rolley SimsMartin, Clydean Posas Ann 06/20/2014, 1:26 PM Infectious Diseases Pharmacist Phone# [phone]

## 2014-06-21 ENCOUNTER — Telehealth: Payer: Self-pay | Admitting: *Deleted

## 2015-03-02 ENCOUNTER — Encounter (HOSPITAL_COMMUNITY): Payer: Self-pay | Admitting: Emergency Medicine

## 2015-03-02 ENCOUNTER — Emergency Department (HOSPITAL_COMMUNITY): Payer: Commercial Managed Care - HMO

## 2015-03-02 ENCOUNTER — Observation Stay (HOSPITAL_COMMUNITY)
Admission: EM | Admit: 2015-03-02 | Discharge: 2015-03-03 | Disposition: A | Discharge status: Home / Self Care | Payer: Commercial Managed Care - HMO | Attending: Internal Medicine | Admitting: Internal Medicine

## 2015-03-02 DIAGNOSIS — R001 Bradycardia, unspecified: Secondary | ICD-10-CM

## 2015-03-02 DIAGNOSIS — R55 Syncope and collapse: Principal | ICD-10-CM | POA: Insufficient documentation

## 2015-03-02 DIAGNOSIS — K219 Gastro-esophageal reflux disease without esophagitis: Secondary | ICD-10-CM | POA: Diagnosis not present

## 2015-03-02 DIAGNOSIS — Z79899 Other long term (current) drug therapy: Secondary | ICD-10-CM | POA: Diagnosis not present

## 2015-03-02 DIAGNOSIS — F419 Anxiety disorder, unspecified: Secondary | ICD-10-CM | POA: Insufficient documentation

## 2015-03-02 DIAGNOSIS — F329 Major depressive disorder, single episode, unspecified: Secondary | ICD-10-CM | POA: Insufficient documentation

## 2015-03-02 DIAGNOSIS — R42 Dizziness and giddiness: Secondary | ICD-10-CM | POA: Diagnosis present

## 2015-03-02 DIAGNOSIS — E876 Hypokalemia: Secondary | ICD-10-CM | POA: Diagnosis present

## 2015-03-02 DIAGNOSIS — Z720 Tobacco use: Secondary | ICD-10-CM | POA: Diagnosis present

## 2015-03-02 DIAGNOSIS — M5431 Sciatica, right side: Secondary | ICD-10-CM | POA: Insufficient documentation

## 2015-03-02 DIAGNOSIS — D649 Anemia, unspecified: Secondary | ICD-10-CM | POA: Diagnosis present

## 2015-03-02 HISTORY — DX: Syncope and collapse: R55

## 2015-03-02 HISTORY — DX: Bradycardia, unspecified: R00.1

## 2015-03-02 HISTORY — DX: Tobacco use: Z72.0

## 2015-03-02 LAB — CBC
HEMATOCRIT: 33.5 % — AB (ref 36.0–46.0)
Hemoglobin: 11.7 g/dL — ABNORMAL LOW (ref 12.0–15.0)
MCH: 32.3 pg (ref 26.0–34.0)
MCHC: 34.9 g/dL (ref 30.0–36.0)
MCV: 92.5 fL (ref 78.0–100.0)
PLATELETS: 190 10*3/uL (ref 150–400)
RBC: 3.62 MIL/uL — ABNORMAL LOW (ref 3.87–5.11)
RDW: 12.7 % (ref 11.5–15.5)
WBC: 4.2 10*3/uL (ref 4.0–10.5)

## 2015-03-02 LAB — URINALYSIS, ROUTINE W REFLEX MICROSCOPIC
BILIRUBIN URINE: NEGATIVE
Glucose, UA: NEGATIVE mg/dL
HGB URINE DIPSTICK: NEGATIVE
KETONES UR: NEGATIVE mg/dL
Leukocytes, UA: NEGATIVE
NITRITE: NEGATIVE
PROTEIN: NEGATIVE mg/dL
Specific Gravity, Urine: 1.02 (ref 1.005–1.030)
UROBILINOGEN UA: 0.2 mg/dL (ref 0.0–1.0)
pH: 6 (ref 5.0–8.0)

## 2015-03-02 LAB — BASIC METABOLIC PANEL
Anion gap: 6 (ref 5–15)
BUN: 8 mg/dL (ref 6–20)
CO2: 27 mmol/L (ref 22–32)
Calcium: 9.3 mg/dL (ref 8.9–10.3)
Chloride: 104 mmol/L (ref 101–111)
Creatinine, Ser: 1.07 mg/dL — ABNORMAL HIGH (ref 0.44–1.00)
GFR calc Af Amer: >60 mL/min (ref >60)
GLUCOSE: 89 mg/dL (ref 65–99)
Potassium: 3.4 mmol/L — ABNORMAL LOW (ref 3.5–5.1)
Sodium: 137 mmol/L (ref 135–145)

## 2015-03-02 LAB — I-STAT TROPONIN, ED: Troponin i, poc: 0 ng/mL (ref 0.00–0.08)

## 2015-03-02 LAB — CBG MONITORING, ED: Glucose-Capillary: 87 mg/dL (ref 65–99)

## 2015-03-02 MED ORDER — EST ESTROGENS-METHYLTEST 1.25-2.5 MG PO TABS
1.0000 | ORAL_TABLET | Freq: Every day | ORAL | Status: DC
  Administered 2015-03-03: 1 via ORAL
  Filled 2015-03-02: qty 1

## 2015-03-02 MED ORDER — SODIUM CHLORIDE 0.9 % IV BOLUS (SEPSIS)
1000.0000 mL | Freq: Once | INTRAVENOUS | Status: AC
  Administered 2015-03-02: 1000 mL via INTRAVENOUS

## 2015-03-02 MED ORDER — ACETAMINOPHEN 325 MG PO TABS
650.0000 mg | ORAL_TABLET | Freq: Once | ORAL | Status: AC
  Administered 2015-03-02: 650 mg via ORAL
  Filled 2015-03-02: qty 2

## 2015-03-02 MED ORDER — SODIUM CHLORIDE 0.9 % IV SOLN
INTRAVENOUS | Status: DC
  Administered 2015-03-03: via INTRAVENOUS

## 2015-03-02 MED ORDER — SODIUM CHLORIDE 0.9 % IJ SOLN
3.0000 mL | Freq: Two times a day (BID) | INTRAMUSCULAR | Status: DC
Start: 2015-03-02 — End: 2015-03-03
  Administered 2015-03-02: 3 mL via INTRAVENOUS

## 2015-03-02 MED ORDER — ENOXAPARIN SODIUM 40 MG/0.4ML ~~LOC~~ SOLN
40.0000 mg | SUBCUTANEOUS | Status: DC
  Administered 2015-03-03: 40 mg via SUBCUTANEOUS
  Filled 2015-03-02: qty 0.4

## 2015-03-02 MED ORDER — ONDANSETRON HCL 4 MG/2ML IJ SOLN: 4.0000 mg | Freq: Three times a day (TID) | INTRAMUSCULAR | Status: DC | PRN

## 2015-03-02 NOTE — ED Provider Notes (Signed)
The patient is a 49 year old female, she has had intermittent episodes of dizziness throughout the week. She describes them as feeling like she is given a pass out, it had occurred twice today, she denies any prodromal symptoms including palpitations chest pain or shortness of breath. She feels diffusely weak, she now states that she has been having some heaviness in her chest tonight since she has arrived in the ER and some pain around the left side. Her EKG is totally unremarkable except for a sinus bradycardia, her heart rate dips down into the low 40s while I'm in the room and stays consistently between 45 and 50. She has no history of using medications such as beta blockers or calcium channel blockers. She does endorse smoking occasional marijuana, she smokes cigarettes, she denies other cardiovascular risk factors. On exam she has clear heart and lung sounds, soft abdomen, bradycardia noted and strong pulses, no JVD, no edema.   EKG Interpretation  Date/Time:  Wednesday March 02 2015 20:39:15 EDT Ventricular Rate:  55 PR Interval:  176 QRS Duration: 78 QT Interval:  410 QTC Calculation: 392 R Axis:   63 Text Interpretation:  Sinus rhythm Since last tracing rate slower Abnormal ekg Confirmed by Hyacinth MeekerMILLER  MD, Miko Markwood ([id number]) on 03/02/2015 9:42:28 PM      Medical screening examination/treatment/procedure(s) were conducted as a shared visit with non-physician practitioner(s) and myself.  I personally evaluated the patient during the encounter.  Clinical Impression:   Final diagnoses:  Near syncope       I have personally viewed and interpreted the imaging and agree with radiologist interpretation.   Eber HongBrian Keliyah Spillman, MD 03/04/15 [phone]

## 2015-03-02 NOTE — H&P (Signed)
[facility] History and Physical  [name] [name] NWG:[id number] DOB: Feb 21, 1966    PCP:   Pcp Not In System   Chief Complaint: lightheadedness twice today, once last week.   HPI: [name] [name] is an 49 y.o. female with PMH significant for s/p CCY, total hysterectomy, depression and anxiety, taking HCTZ daily for many years for "fluid retention", presented to the ER with 2 episodes where she felt lightheaded, but no frank syncope.  She did not admit to vertigo, palpitation, chest pain, or SOB. These episodes happened when she was speaking with her supervisor, non stressful situation, and subsequently at rest sitting down.  In the ER, she was noted to be bradycardic, asymptomatic, with HR in the mid 40's  Work up in the ER included a normal EKG, negative troponins, and rather unremarkable serology.  She played basketball in HS, and was a Mudlogger.  No family hx of premature CAD, or thyroid problems.  Both parents died in their 79's, mother of Doreatha Martin disease, and father from alcoholism.   Rewiew of Systems:  Constitutional: Negative for malaise, fever and chills. No significant weight loss or weight gain Eyes: Negative for eye pain, redness and discharge, diplopia, visual changes, or flashes of light. ENMT: Negative for ear pain, hoarseness, nasal congestion, sinus pressure and sore throat. No headaches; tinnitus, drooling, or problem swallowing. Cardiovascular: Negative for chest pain, palpitations, diaphoresis, dyspnea and peripheral edema. ; No orthopnea, PND Respiratory: Negative for cough, hemoptysis, wheezing and stridor. No pleuritic chestpain. Gastrointestinal: Negative for nausea, vomiting, diarrhea, constipation, abdominal pain, melena, blood in stool, hematemesis, jaundice and rectal bleeding.    Genitourinary: Negative for frequency, dysuria, incontinence,flank pain and hematuria; Musculoskeletal: Negative for back pain and neck pain. Negative for swelling and  trauma.;  Skin: . Negative for pruritus, rash, abrasions, bruising and skin lesion.; ulcerations Neuro: Negative for headache,  and neck stiffness. Negative for weakness, altered level of consciousness , altered mental status, extremity weakness, burning feet, involuntary movement, seizure and syncope.  Psych: negative for anxiety, depression, insomnia, tearfulness, panic attacks, hallucinations, paranoia, suicidal or homicidal ideation    Past Medical History  Diagnosis Date  . Anxiety   . GERD (gastroesophageal reflux disease)   . Hx of cholecystectomy   . History of hysterectomy   . History of oophorectomy   . Depression   . Sciatica neuralgia, right     Past Surgical History  Procedure Laterality Date  . Abdominal hysterectomy      Medications:  HOME MEDS: Prior to Admission medications   Medication Sig Start Date End Date Taking? Authorizing Provider  estrogen-methylTESTOSTERone (ESTRATEST) 1.25-2.5 MG per tablet Take 1 tablet by mouth daily. 04/08/14  Yes Historical Provider, MD  hydrochlorothiazide (HYDRODIURIL) 12.5 MG tablet Take 12.5 mg by mouth daily. 02/11/15  Yes Historical Provider, MD  ALPRAZolam Prudy Feeler) 1 MG tablet Take 1 mg by mouth 4 (four) times daily as needed for anxiety or sleep (anxiety).  04/28/14   Historical Provider, MD     Allergies:  Allergies  Allergen Reactions  . Sulfa Antibiotics Hives and Nausea And Vomiting    Social History:   reports that she has been smoking.  She does not have any smokeless tobacco history on file. She reports that she does not drink alcohol. Her drug history is not on file.  Family History: History reviewed. No pertinent family history.   Physical Exam: Filed Vitals:   03/02/15 2024 03/02/15 2200 03/02/15 2300  BP: 138/59 116/74 110/70  Pulse: 60 50 51  Temp: 98 F (36.7 C)    TempSrc: Temporal    Resp: 16 18 15   Height: 5\' 2"  (1.575 m)    Weight: 63.504 kg (140 lb)    SpO2: 100% 100% 100%   Blood  pressure 110/70, pulse 51, temperature 98 F (36.7 C), temperature source Temporal, resp. rate 15, height 5\' 2"  (1.575 m), weight 63.504 kg (140 lb), last menstrual period 05/21/1997, SpO2 100 %.  GEN:  Pleasant patient lying in the stretcher in no acute distress; cooperative with exam. PSYCH:  alert and oriented x4; does not appear anxious or depressed; affect is appropriate. HEENT: Mucous membranes pink and anicteric; PERRLA; EOM intact; no cervical lymphadenopathy nor thyromegaly or carotid bruit; no JVD; There were no stridor. Neck is very supple. Breasts:: Not examined CHEST WALL: No tenderness CHEST: Normal respiration, clear to auscultation bilaterally.  HEART: Regular rate and rhythm.  There are no murmur, rub, or gallops.   BACK: No kyphosis or scoliosis; no CVA tenderness ABDOMEN: soft and non-tender; no masses, no organomegaly, normal abdominal bowel sounds; no pannus; no intertriginous candida. There is no rebound and no distention. Rectal Exam: Not done EXTREMITIES: No bone or joint deformity; age-appropriate arthropathy of the hands and knees; no edema; no ulcerations.  There is no calf tenderness. Genitalia: not examined PULSES: 2+ and symmetric SKIN: Normal hydration no rash or ulceration CNS: Cranial nerves 2-12 grossly intact no focal lateralizing neurologic deficit.  Speech is fluent; uvula elevated with phonation, facial symmetry and tongue midline. DTR are normal bilaterally, cerebella exam is intact, barbinski is negative and strengths are equaled bilaterally.  No sensory loss.   Labs on Admission:  Basic Metabolic Panel:  Recent Labs Lab 03/02/15 2040  NA 137  K 3.4*  CL 104  CO2 27  GLUCOSE 89  BUN 8  CREATININE 1.07*  CALCIUM 9.3   CBC:  Recent Labs Lab 03/02/15 2040  WBC 4.2  HGB 11.7*  HCT 33.5*  MCV 92.5  PLT 190   CBG:  Recent Labs Lab 03/02/15 2112  GLUCAP 87     Radiological Exams on Admission: Dg Chest 2 View  03/02/2015   CLINICAL DATA:  Left-sided chest pain.  Near syncope.  Bradycardia. EXAM: CHEST  2 VIEW COMPARISON:  03/06/2011 FINDINGS: The heart size and mediastinal contours are within normal limits. Both lungs are clear. The visualized skeletal structures are unremarkable. IMPRESSION: No active cardiopulmonary disease. Electronically Signed   By: Burman NievesWilliam  Stevens M.D.   On: 03/02/2015 22:55    EKG: Independently reviewed.  Assessment/Plan Present on Admission:  . Near syncope . Bradycardia . Pre-syncope  PLAN:  Will admit her for observation since she had 2 episodes of lightheadedness today, and once last week, with relative bradycardia.  She is on no negative chronotrophs.  Will obtain TSH, ECHO, and give IVF.  I will d/c her HCTZ, and obtain orthostatic symptomology.  She is stable, full code, and [facility]   Thank you and Good Day.   Other plans as per orders.  Code Status: FULL Unk LightningODE.    Jensine Luz, MD. [facility] Pager [phone] 7pm to 7am.  03/02/2015, 11:24 PM

## 2015-03-02 NOTE — ED Provider Notes (Signed)
CSN: [id number]     Arrival date & time 03/02/15  2021 History   First MD Initiated Contact with Patient 03/02/15 2028     Chief Complaint  Patient presents with  . Dizziness    HPI   [name] [name] is a 49 year old female with no significant PMH who presents for evaluation of two pre-syncopal episodes today. States that the first episode occurred at ~1:00 PM when she was standing talking to her co-workers. She states that she suddenly felt lightheaded like she was going to pass out. She thinks the episode lasted a few minutes. Pt states a similar episode occurred ~63min PTA while she was driving. Denies any associated chest pain, SOB, diaphoresis with these episodes. Denies vertigo or tinnitus. States nothing like this has ever happened before. Denies recent illness, fever, chills, N/V/D. She does report a headache of gradual onset that started earlier this afternoon. She describes the headache as "someone squeezing my brain."   In the ED she reports she continues to feel weak and fatigued. States she also now has some vague LUQ abdominal discomfort. She also endorses some mild chest tightness.   Past Medical History  Diagnosis Date  . Anxiety   . GERD (gastroesophageal reflux disease)   . Hx of cholecystectomy   . History of hysterectomy   . History of oophorectomy   . Depression   . Sciatica neuralgia, right    Past Surgical History  Procedure Laterality Date  . Abdominal hysterectomy     No family history on file. Social History  Substance Use Topics  . Smoking status: Current Every Day Smoker -- 0.50 packs/day  . Smokeless tobacco: None  . Alcohol Use: No   OB History    No data available     Review of Systems  All other systems reviewed and are negative.     Allergies  Sulfa antibiotics  Home Medications   Prior to Admission medications   Medication Sig Start Date End Date Taking? Authorizing Provider  estrogen-methylTESTOSTERone (ESTRATEST) 1.25-2.5 MG per  tablet Take 1 tablet by mouth daily. 04/08/14  Yes Historical Provider, MD  hydrochlorothiazide (HYDRODIURIL) 12.5 MG tablet Take 12.5 mg by mouth daily. 02/11/15  Yes Historical Provider, MD  ALPRAZolam Prudy Feeler) 1 MG tablet Take 1 mg by mouth 4 (four) times daily as needed for anxiety or sleep (anxiety).  04/28/14   Historical Provider, MD   BP 116/74 mmHg  Pulse 50  Temp(Src) 98 F (36.7 C) (Temporal)  Resp 18  Ht  (1.575 m)  Wt 140 lb (63.504 kg)  BMI 25.60 kg/m2  SpO2 100%  LMP 05/21/1997 Physical Exam  Constitutional: She is oriented to person, place, and time.  HENT:  Right Ear: External ear normal.  Left Ear: External ear normal.  Nose: Nose normal.  Mouth/Throat: Oropharynx is clear and moist. No oropharyngeal exudate.  Eyes: Conjunctivae and EOM are normal. Pupils are equal, round, and reactive to light.  Neck: Normal range of motion. Neck supple. No tracheal deviation present.  Cardiovascular: Normal rate, regular rhythm, normal heart sounds and intact distal pulses.   No murmur heard. Pulmonary/Chest: Effort normal and breath sounds normal. No respiratory distress. She has no wheezes.  Abdominal: Soft. Bowel sounds are normal. She exhibits no distension. There is no tenderness. There is no rebound and no guarding.  Musculoskeletal: She exhibits no edema.  Lymphadenopathy:    She has no cervical adenopathy.  Neurological: She is alert and oriented to person, place, and  time. She has normal strength and normal reflexes. No cranial nerve deficit. She displays a negative Romberg sign. Coordination normal.  Skin: Skin is warm and dry.  Psychiatric: Her mood appears anxious.  Nursing note and vitals reviewed.   ED Course  Procedures (including critical care time)  Vague etiology of symptoms. Given description of episodes and pt's intact neurological exam, presentation consistent with syncope and suspicion for stroke/ACS low. EKG shows sinus bradycardia at 55 and HR  stable bradycardia during exam 40s-50s. Will check basic labs including CBC, BMP, UA, orthostatics. Given new chest tightness will check troponin and CXR.   Labs Review Labs Reviewed  BASIC METABOLIC PANEL - Abnormal; Notable for the following:    Potassium 3.4 (*)    Creatinine, Ser 1.07 (*)    All other components within normal limits  CBC - Abnormal; Notable for the following:    RBC 3.62 (*)    Hemoglobin 11.7 (*)    HCT 33.5 (*)    All other components within normal limits  URINALYSIS, ROUTINE W REFLEX MICROSCOPIC (NOT AT [facility])  CBG MONITORING, ED  I-STAT TROPOININ, ED    Imaging Review No results found. I have personally reviewed and evaluated these images and lab results as part of my medical decision-making.   EKG Interpretation   Date/Time:  Wednesday March 02 2015 20:39:15 EDT Ventricular Rate:  55 PR Interval:  176 QRS Duration: 78 QT Interval:  410 QTC Calculation: 392 R Axis:   63 Text Interpretation:  Sinus rhythm Since last tracing rate slower Abnormal  ekg Confirmed by MILLER  MD, BRIAN ([id number]) on 03/02/2015 9:42:28 PM      MDM   Final diagnoses:  Near syncope      10:50 PM  Troponin negative. However, given unclear etiology of pt's multiple symptoms and her persistent bradycardia, concern for cardiogenic syncope 2/2 sick sinus vs. Arrhythmia not captured on ED EKG. Labs otherwise unremarkable. Discussed with hospitalist for admission for cardiac monitoring.     Karis JubaSerena Y Samaniego, PA-C 03/02/15 2307  Eber HongBrian Miller, MD 03/04/15 [phone]

## 2015-03-02 NOTE — ED Notes (Signed)
Onset one week ago had dizzy spell, had another one today at work, " feels like I am spinning and get light headed"

## 2015-03-03 ENCOUNTER — Encounter (HOSPITAL_COMMUNITY): Payer: Self-pay | Admitting: *Deleted

## 2015-03-03 ENCOUNTER — Observation Stay (HOSPITAL_BASED_OUTPATIENT_CLINIC_OR_DEPARTMENT_OTHER): Payer: Commercial Managed Care - HMO

## 2015-03-03 DIAGNOSIS — D649 Anemia, unspecified: Secondary | ICD-10-CM | POA: Diagnosis not present

## 2015-03-03 DIAGNOSIS — E876 Hypokalemia: Secondary | ICD-10-CM

## 2015-03-03 DIAGNOSIS — Z720 Tobacco use: Secondary | ICD-10-CM

## 2015-03-03 DIAGNOSIS — R001 Bradycardia, unspecified: Secondary | ICD-10-CM | POA: Diagnosis not present

## 2015-03-03 DIAGNOSIS — R55 Syncope and collapse: Secondary | ICD-10-CM | POA: Diagnosis not present

## 2015-03-03 HISTORY — DX: Tobacco use: Z72.0

## 2015-03-03 LAB — COMPREHENSIVE METABOLIC PANEL
ALT: 18 U/L (ref 14–54)
AST: 21 U/L (ref 15–41)
Albumin: 3.3 g/dL — ABNORMAL LOW (ref 3.5–5.0)
Alkaline Phosphatase: 51 U/L (ref 38–126)
Anion gap: 4 — ABNORMAL LOW (ref 5–15)
BILIRUBIN TOTAL: 0.7 mg/dL (ref 0.3–1.2)
BUN: 9 mg/dL (ref 6–20)
CHLORIDE: 108 mmol/L (ref 101–111)
CO2: 27 mmol/L (ref 22–32)
CREATININE: 0.94 mg/dL (ref 0.44–1.00)
Calcium: 8.4 mg/dL — ABNORMAL LOW (ref 8.9–10.3)
Glucose, Bld: 94 mg/dL (ref 65–99)
POTASSIUM: 3.8 mmol/L (ref 3.5–5.1)
Sodium: 139 mmol/L (ref 135–145)
TOTAL PROTEIN: 5.7 g/dL — AB (ref 6.5–8.1)

## 2015-03-03 LAB — CBC
HCT: 31.6 % — ABNORMAL LOW (ref 36.0–46.0)
Hemoglobin: 10.8 g/dL — ABNORMAL LOW (ref 12.0–15.0)
MCH: 32 pg (ref 26.0–34.0)
MCHC: 34.2 g/dL (ref 30.0–36.0)
MCV: 93.5 fL (ref 78.0–100.0)
PLATELETS: 172 10*3/uL (ref 150–400)
RBC: 3.38 MIL/uL — AB (ref 3.87–5.11)
RDW: 12.8 % (ref 11.5–15.5)
WBC: 3.1 10*3/uL — AB (ref 4.0–10.5)

## 2015-03-03 LAB — FERRITIN: FERRITIN: 34 ng/mL (ref 11–307)

## 2015-03-03 LAB — VITAMIN B12: Vitamin B-12: 305 pg/mL (ref 180–914)

## 2015-03-03 LAB — IRON AND TIBC
IRON: 100 ug/dL (ref 28–170)
Saturation Ratios: 30 % (ref 10.4–31.8)
TIBC: 337 ug/dL (ref 250–450)
UIBC: 237 ug/dL

## 2015-03-03 LAB — TSH: TSH: 2.841 u[IU]/mL (ref 0.350–4.500)

## 2015-03-03 MED ORDER — POTASSIUM CHLORIDE ER 10 MEQ PO TBCR: 10.0000 meq | EXTENDED_RELEASE_TABLET | Freq: Every day | ORAL | Status: DC | PRN

## 2015-03-03 MED ORDER — HYDROCODONE-ACETAMINOPHEN 5-325 MG PO TABS
1.0000 | ORAL_TABLET | Freq: Four times a day (QID) | ORAL | Status: DC | PRN
  Administered 2015-03-03: 1 via ORAL
  Filled 2015-03-03: qty 1

## 2015-03-03 MED ORDER — HYDROCHLOROTHIAZIDE 12.5 MG PO TABS: 12.5000 mg | ORAL_TABLET | Freq: Every day | ORAL | Status: DC

## 2015-03-03 NOTE — Plan of Care (Signed)
Problem: Phase I Progression Outcomes Goal: Pain controlled with appropriate interventions Outcome: Completed/Met Date Met:  03/03/15 Pt denies pain. Goal: OOB as tolerated unless otherwise ordered Outcome: Progressing Pt resting in bed/bedrest. Goal: Hemodynamically stable Outcome: Progressing See flowsheet.

## 2015-03-03 NOTE — Discharge Summary (Signed)
Physician Discharge Summary  Tish FredericksonStacey B Williams ZOX:[id number] DOB: 12/24/1965 DOA: 03/02/2015  PCP: Pcp Not In System  Admit date: 03/02/2015 Discharge date: 03/03/2015  Time spent: Greater than 30 minutes  Recommendations for Outpatient Follow-up:  1. Recommend follow-up of the patient's heart rate, renal function, and serum potassium.    Discharge Diagnoses:  1. Near-syncope/presyncope. 2. Bradycardia. 3. Normocytic anemia. 4. Tobacco abuse. The patient was advised to stop smoking. 5. Hypokalemia. 6. Acute renal insufficiency secondary to prerenal azotemia. Resolved.  Discharge Condition: Improved.  Diet recommendation: Heart healthy.  Filed Weights   03/02/15 2024 03/02/15 2351  Weight: 63.504 kg (140 lb) 66.86 kg (147 lb 6.4 oz)    History of present illness:  The patient is a 49 year old woman with a history of anxiety, fluid retention treated with HCTZ, and tobacco use, who presented to the emergency department on 03/02/2015 with a chief complaint of lightheadedness. In the ED, she was bradycardic with heart rate ranging from 50-60 bpm. Her EKG revealed sinus bradycardia with a rate of 49 bpm. Her blood pressure was within normal limits. Her lab data were significant for serum potassium of 3.4, creatinine of 1.07, hemoglobin 11.7, negative troponin I, negative urinalysis, and an unremarkable chest x-ray. She was admitted for observation, evaluation, and management.  Hospital Course:  HCTZ was withheld and the patient was started on IV fluids for hydration. She was mild orthostatic on admission. Potassium chloride was ordered for supplementation. For further evaluation, number studies were ordered. Her 2-D echocardiogram revealed an EF of 55-60% and normal diastolic function; no significant valvular abnormalities. Her anemia panel was normal/within normal limits. Her TSH was within normal limits at 2.8. Her follow-up serum potassium was within normal limits. Follow-up creatinine  normalized.  Her heart rate during the hospitalization, ranged from 52 at rest to 96 with ambulation. She had no syncopal or presyncopal symptomatology during the hospitalization. The etiology of her presyncope was unclear, but it could've been secondary to orthostatic hypotension and mild following depletion in the setting of chronic HCTZ therapy. However, bradycardia could not be ruled out. Therefore, an outpatient appointment was made for the patient to follow-up with cardiology for further evaluation. The patient was instructed to change HCTZ to when necessary rather than daily and to take serum potassium which was prescribed with the diuretic as needed. She voiced understanding. She was also encouraged to stop smoking.     Procedures: Study Conclusions - Left ventricle: The cavity size was normal. Wall thickness was normal. Systolic function was normal. The estimated ejection fraction was in the range of 55% to 60%. Wall motion was normal; there were no regional wall motion abnormalities. Left ventricular diastolic function parameters were normal. - Mitral valve: There was trivial regurgitation. - Right atrium: Central venous pressure (est): 3 mm Hg. - Tricuspid valve: There was trivial regurgitation. - Pulmonary arteries: PA peak pressure: 22 mm Hg (S). - Pericardium, extracardiac: There was no pericardial effusion. Impressions: - Normal LV wall thickness with LVEF 55-60% and normal diastolic function. Trivial mitral and tricuspid regurgitation.  Consultations:  None  Discharge Exam: Filed Vitals:   03/03/15 1212  BP: 98/54  Pulse: 52  Temp: 98.6 F (37 C)  Resp: 18   oxygen saturation 100% on room air.   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  Cardiovascular: S1, S2, with soft systolic murmur and bradycardia.  Respiratory: clear to auscultation bilaterally. Breathing nonlabored.  Neurologic: She is alert and oriented 3. Cranial  nerves II  through XII are intact. Gait is within normal limits.  Discharge Instructions   Discharge Instructions    Diet - low sodium heart healthy    Complete by:  As directed      Discharge instructions    Complete by:  As directed   Stand slowly. Avoid dehydration. Take potassium whenever to take HCTZ. Follow up with your new appointments. Stop smoking.     Increase activity slowly    Complete by:  As directed           Current Discharge Medication List    START taking these medications   Details  potassium chloride (K-DUR) 10 MEQ tablet Take 1 tablet (10 mEq total) by mouth daily as needed (Take with HCTZ.). Qty: 30 tablet, Refills: 2      CONTINUE these medications which have CHANGED   Details  hydrochlorothiazide (HYDRODIURIL) 12.5 MG tablet Take 1 tablet (12.5 mg total) by mouth daily. Take as needed for swelling      CONTINUE these medications which have NOT CHANGED   Details  estrogen-methylTESTOSTERone (ESTRATEST) 1.25-2.5 MG per tablet Take 1 tablet by mouth daily. Refills: 5    ALPRAZolam (XANAX) 1 MG tablet Take 1 mg by mouth 4 (four) times daily as needed for anxiety or sleep (anxiety).  Refills: 5       Allergies  Allergen Reactions  . Sulfa Antibiotics Hives and Nausea And Vomiting   Follow-up Information    Follow up with Charlton Haws, MD On 03/07/2015.   Specialty:  Cardiology   Why:  Appointment with Cardiology Monday Oct. 17th at 1:40pm in the [facility].   Contact information:   [address] [place] [address] [phone]       Please follow up.   Why:  Follow up with Carollee Herter  in 2 weeks for hospital follow up.       The results of significant diagnostics from this hospitalization (including imaging, microbiology, ancillary and laboratory) are listed below for reference.    Significant Diagnostic Studies: Dg Chest 2 View  03/02/2015  CLINICAL DATA:  Left-sided chest pain.  Near syncope.  Bradycardia.  EXAM: CHEST  2 VIEW COMPARISON:  03/06/2011 FINDINGS: The heart size and mediastinal contours are within normal limits. Both lungs are clear. The visualized skeletal structures are unremarkable. IMPRESSION: No active cardiopulmonary disease. Electronically Signed   By: Burman Nieves M.D.   On: 03/02/2015 22:55    Microbiology: No results found for this or any previous visit (from the past 240 hour(s)).   Labs: Basic Metabolic Panel:  Recent Labs Lab 03/02/15 2040 03/03/15 0735  NA 137 139  K 3.4* 3.8  CL 104 108  CO2 27 27  GLUCOSE 89 94  BUN 8 9  CREATININE 1.07* 0.94  CALCIUM 9.3 8.4*   Liver Function Tests:  Recent Labs Lab 03/03/15 0735  AST 21  ALT 18  ALKPHOS 51  BILITOT 0.7  PROT 5.7*  ALBUMIN 3.3*   No results for input(s): LIPASE, AMYLASE in the last 168 hours. No results for input(s): AMMONIA in the last 168 hours. CBC:  Recent Labs Lab 03/02/15 2040 03/03/15 0735  WBC 4.2 3.1*  HGB 11.7* 10.8*  HCT 33.5* 31.6*  MCV 92.5 93.5  PLT 190 172   Cardiac Enzymes: No results for input(s): CKTOTAL, CKMB, CKMBINDEX, TROPONINI in the last 168 hours. BNP: BNP (last 3 results) No results for input(s): BNP in the last 8760 hours.  ProBNP (last 3 results)  No results for input(s): PROBNP in the last 8760 hours.  CBG:  Recent Labs Lab 03/02/15 2112  GLUCAP 87       Signed:  Trenity Pha  [facility] 03/03/2015, 4:01 PM

## 2015-03-03 NOTE — Progress Notes (Signed)
IV taken out per patient request.  MD made aware and states not to start a new IV due to future discharge. RN will continue to monitor. Lesly Dukesachel J Everett, RN

## 2015-03-03 NOTE — Care Management Note (Signed)
Case Management Note  Patient Details  Name: [name] [name] MRN: [id number] Date of Birth: 04-19-1966  Subjective/Objective:                  Pt admitted from home with near syncope. Pt lives with family and will return home at discharge. Pt is independent with ADL's.  Action/Plan: No CM needs anticipated.  Expected Discharge Date:                  Expected Discharge Plan:  Home/Self Care  In-House Referral:  NA  Discharge planning Services  CM Consult  Post Acute Care Choice:  NA Choice offered to:  NA  DME Arranged:    DME Agency:     HH Arranged:    HH Agency:     Status of Service:  Completed, signed off  Medicare Important Message Given:    Date Medicare IM Given:    Medicare IM give by:    Date Additional Medicare IM Given:    Additional Medicare Important Message give by:     If discussed at Long Length of Stay Meetings, dates discussed:    Additional Comments:  Cheryl FlashBlackwell, [place] Crowder, RN 03/03/2015, 1:03 PM

## 2015-03-03 NOTE — Progress Notes (Signed)
Pt discharged per Dr.Fisher. IV site D/C. Pt education completed, prescriptions given, home medication list provided.  Pt left with belongings. VSS. Lesly Dukesachel J Everett, RN

## 2015-03-03 NOTE — Progress Notes (Signed)
Pt insisted on going outside to smoke. Pt was advised against it and explained about the policy. MD notified. RN will continue to monitor. Lesly Dukesachel J Everett, RN

## 2015-03-07 ENCOUNTER — Encounter: Payer: Self-pay | Admitting: Cardiovascular Disease

## 2015-03-07 ENCOUNTER — Ambulatory Visit (INDEPENDENT_AMBULATORY_CARE_PROVIDER_SITE_OTHER): Payer: Commercial Managed Care - HMO | Admitting: Cardiovascular Disease

## 2015-03-07 VITALS — BP 103/64 | HR 61 | Ht 62.0 in | Wt 144.2 lb

## 2015-03-07 DIAGNOSIS — R072 Precordial pain: Secondary | ICD-10-CM

## 2015-03-07 DIAGNOSIS — R42 Dizziness and giddiness: Secondary | ICD-10-CM | POA: Diagnosis not present

## 2015-03-07 NOTE — Progress Notes (Signed)
Patient ID: [name] [name], female   DOB: 29-Dec-1965, 49 y.o.   MRN: [id number]     Cardiology Office Note   Date:  03/07/2015   ID:  [name] [name], DOB 1966/01/12, MRN [id number]  PCP:  [facility]  Cardiologist:   Charlton Haws, MD   No chief complaint on file.     History of Present Illness: [name] [name] is a 49 y.o. female who presents for evaluation of dizzyness.  Seen in hospital 03/02/15 with overnight observation   PMH of  depression and anxiety, taking HCTZ daily for many years for "fluid retention", presented to the ER with 2 episodes where she felt lightheaded, but no frank syncope. She did not admit to vertigo, palpitation, chest pain, or SOB. These episodes happened when she was speaking with her supervisor, non stressful situation, and subsequently at rest sitting down. In the ER, she was noted to be bradycardic, asymptomatic, with HR in the mid 40's Work up in the ER included a normal EKG, negative troponins, and rather unremarkable serology. She played basketball in HS, and was a Mudlogger. No family hx of premature CAD, or thyroid problems. Both parents died in their 42's, mother of Doreatha Martin disease, and father from alcoholism.   Reviewed echo 03/03/15 normal EF 55-60% Labs reviewed all normal including K,CR,TSH except some anemia Telemetry SR/SB no AV block  Still with some atypical pains in chest. Sharp fleeting not always exertional.  Dizzy no syncope No vertigo symptoms of spinning or nausea  She indicates being on diuretic for over 20 years after 2 bouts of "toxemia" with pregnancy.  She indicates not being able to urinate without Pill and retaining fluid  Past Medical History  Diagnosis Date  . Anxiety   . GERD (gastroesophageal reflux disease)   . Hx of cholecystectomy   . History of hysterectomy   . History of oophorectomy   . Depression   . Sciatica neuralgia, right   . Tobacco abuse 03/03/2015  . Near  syncope 03/02/2015  . Bradycardia 03/02/2015    Past Surgical History  Procedure Laterality Date  . Abdominal hysterectomy       Current Outpatient Prescriptions  Medication Sig Dispense Refill  . ALPRAZolam (XANAX) 1 MG tablet Take 1 mg by mouth 4 (four) times daily as needed for anxiety or sleep (anxiety).   5  . estrogen-methylTESTOSTERone (ESTRATEST) 1.25-2.5 MG per tablet Take 1 tablet by mouth daily.  5  . hydrochlorothiazide (HYDRODIURIL) 12.5 MG tablet Take 1 tablet (12.5 mg total) by mouth daily. Take as needed for swelling    . potassium chloride (K-DUR) 10 MEQ tablet Take 1 tablet (10 mEq total) by mouth daily as needed (Take with HCTZ.). 30 tablet 2   No current facility-administered medications for this visit.    Allergies:   Sulfa antibiotics    Social History:  The patient  reports that she has been smoking Cigarettes.  She has been smoking about 0.25 packs per day. She does not have any smokeless tobacco history on file. She reports that she does not drink alcohol.   Family History:  The patient's ALS and alcoholism see HPI   ROS:  Please see the history of present illness.   Otherwise, review of systems are positive for none.   All other systems are reviewed and negative.    PHYSICAL EXAM: VS:  BP 103/64 mmHg  Pulse 61  Ht  (1.575 m)  Wt 65.409 kg (144 lb  3.2 oz)  BMI 26.37 kg/m2  SpO2 98%  LMP 05/21/1997 , BMI Body mass index is 26.37 kg/(m^2). Affect appropriate Healthy:  appears stated age HEENT: normal Neck supple with no adenopathy JVP normal no bruits no thyromegaly Lungs clear with no wheezing and good diaphragmatic motion Heart:  S1/S2 no murmur, no rub, gallop or click PMI normal Abdomen: benighn, BS positve, no tenderness, no AAA no bruit.  No HSM or HJR Distal pulses intact with no bruits No edema Neuro non-focal Skin warm and dry No muscular weakness    EKG:  03/02/15  SR with sinus arrhythmia rate 55  10/13  SR rate 55 normal  P wave morphology on both ECG;s and no AV block    Recent Labs: 03/02/2015: TSH 2.841 03/03/2015: ALT 18; BUN 9; Creatinine, Ser 0.94; Hemoglobin 10.8*; Platelets 172; Potassium 3.8; Sodium 139    Lipid Panel No results found for: CHOL, TRIG, HDL, CHOLHDL, VLDL, LDLCALC, LDLDIRECT    Wt Readings from Last 3 Encounters:  03/07/15 65.409 kg (144 lb 3.2 oz)  03/02/15 66.86 kg (147 lb 6.4 oz)  05/31/14 63.504 kg (140 lb)      Other studies Reviewed: Additional studies/ records that were reviewed today include: Hospital records, labs CXR and ECG;s .    ASSESSMENT AND PLAN:  1.  Dizzyness:  Non cardiac.  Should have ENT evaluation with vertigo maneuvers to further assess  Consider CT head if persists 2. Relative bradycardia;  Likely just high resting vagal tone  F/U ETT in setting of chest pain and assess chronotropic response echo to r/o structural heart disease 3. Urology:  Not clear why she cannot stop diuretic.  Consider urology consult for bladder emptying study    Current medicines are reviewed at length with the patient today.  The patient does not have concerns regarding medicines.  The following changes have been made:  no change  Labs/ tests ordered today include: Echo and ETT  No orders of the defined types were placed in this encounter.     Disposition:   FU with cardiology PRN     Signed, Charlton HawsPeter Tashina Credit, MD  03/07/2015 2:02 PM    [facility] [address], [place], [place]  [address] Phone: [phone]; Fax: [phone]

## 2015-03-07 NOTE — Patient Instructions (Signed)
Your physician recommends that you schedule a follow-up appointment in: as needed   Your physician recommends that you continue on your current medications as directed. Please refer to the Current Medication list given to you today.     Your physician has requested that you have an echocardiogram. Echocardiography is a painless test that uses sound waves to create images of your heart. It provides your doctor with information about the size and shape of your heart and how well your heart's chambers and valves are working. This procedure takes approximately one hour. There are no restrictions for this procedure.       Your physician has requested that you have an exercise tolerance test. For further information please visit [email or web address]. Please also follow instruction sheet, as given.        Thank you for choosing [facility] !

## 2015-03-14 ENCOUNTER — Encounter (HOSPITAL_COMMUNITY): Payer: Self-pay | Admitting: Emergency Medicine

## 2015-03-14 ENCOUNTER — Emergency Department (HOSPITAL_COMMUNITY): Payer: Commercial Managed Care - HMO

## 2015-03-14 ENCOUNTER — Emergency Department (HOSPITAL_COMMUNITY)
Admission: EM | Admit: 2015-03-14 | Discharge: 2015-03-14 | Disposition: A | Discharge status: Home / Self Care | Payer: Commercial Managed Care - HMO | Attending: Emergency Medicine | Admitting: Emergency Medicine

## 2015-03-14 DIAGNOSIS — Z720 Tobacco use: Secondary | ICD-10-CM | POA: Insufficient documentation

## 2015-03-14 DIAGNOSIS — R079 Chest pain, unspecified: Secondary | ICD-10-CM | POA: Insufficient documentation

## 2015-03-14 DIAGNOSIS — Z79899 Other long term (current) drug therapy: Secondary | ICD-10-CM | POA: Diagnosis not present

## 2015-03-14 DIAGNOSIS — F419 Anxiety disorder, unspecified: Secondary | ICD-10-CM | POA: Insufficient documentation

## 2015-03-14 DIAGNOSIS — R531 Weakness: Secondary | ICD-10-CM | POA: Insufficient documentation

## 2015-03-14 DIAGNOSIS — R42 Dizziness and giddiness: Secondary | ICD-10-CM

## 2015-03-14 DIAGNOSIS — K219 Gastro-esophageal reflux disease without esophagitis: Secondary | ICD-10-CM | POA: Diagnosis not present

## 2015-03-14 DIAGNOSIS — F329 Major depressive disorder, single episode, unspecified: Secondary | ICD-10-CM | POA: Diagnosis not present

## 2015-03-14 LAB — COMPREHENSIVE METABOLIC PANEL
ALBUMIN: 3.8 g/dL (ref 3.5–5.0)
ALT: 19 U/L (ref 14–54)
ANION GAP: 10 (ref 5–15)
AST: 25 U/L (ref 15–41)
Alkaline Phosphatase: 61 U/L (ref 38–126)
BILIRUBIN TOTAL: 0.7 mg/dL (ref 0.3–1.2)
BUN: 6 mg/dL (ref 6–20)
CO2: 25 mmol/L (ref 22–32)
Calcium: 9.8 mg/dL (ref 8.9–10.3)
Chloride: 104 mmol/L (ref 101–111)
Creatinine, Ser: 0.86 mg/dL (ref 0.44–1.00)
Glucose, Bld: 99 mg/dL (ref 65–99)
POTASSIUM: 3.8 mmol/L (ref 3.5–5.1)
Sodium: 139 mmol/L (ref 135–145)
TOTAL PROTEIN: 6.9 g/dL (ref 6.5–8.1)

## 2015-03-14 LAB — CBC WITH DIFFERENTIAL/PLATELET
BASOS ABS: 0 10*3/uL (ref 0.0–0.1)
BASOS PCT: 1 %
Eosinophils Absolute: 0.1 10*3/uL (ref 0.0–0.7)
Eosinophils Relative: 3 %
HEMATOCRIT: 36.7 % (ref 36.0–46.0)
Hemoglobin: 12.7 g/dL (ref 12.0–15.0)
LYMPHS PCT: 49 %
Lymphs Abs: 1.6 10*3/uL (ref 0.7–4.0)
MCH: 32.1 pg (ref 26.0–34.0)
MCHC: 34.6 g/dL (ref 30.0–36.0)
MCV: 92.7 fL (ref 78.0–100.0)
Monocytes Absolute: 0.3 10*3/uL (ref 0.1–1.0)
Monocytes Relative: 8 %
NEUTROS ABS: 1.3 10*3/uL — AB (ref 1.7–7.7)
NEUTROS PCT: 39 %
Platelets: 178 10*3/uL (ref 150–400)
RBC: 3.96 MIL/uL (ref 3.87–5.11)
RDW: 13.3 % (ref 11.5–15.5)
WBC: 3.3 10*3/uL — AB (ref 4.0–10.5)

## 2015-03-14 MED ORDER — MECLIZINE HCL 50 MG PO TABS: 25.0000 mg | ORAL_TABLET | Freq: Three times a day (TID) | ORAL | Status: AC

## 2015-03-14 MED ORDER — LORAZEPAM 2 MG/ML IJ SOLN
1.0000 mg | Freq: Once | INTRAMUSCULAR | Status: AC
  Administered 2015-03-14: 1 mg via INTRAVENOUS
  Filled 2015-03-14: qty 1

## 2015-03-14 MED ORDER — SODIUM CHLORIDE 0.9 % IV BOLUS (SEPSIS)
1000.0000 mL | Freq: Once | INTRAVENOUS | Status: AC
  Administered 2015-03-14: 1000 mL via INTRAVENOUS

## 2015-03-14 MED ORDER — LORAZEPAM 1 MG PO TABS: 1.0000 mg | ORAL_TABLET | Freq: Three times a day (TID) | ORAL | Status: DC | PRN

## 2015-03-14 MED ORDER — MECLIZINE HCL 25 MG PO TABS
25.0000 mg | ORAL_TABLET | Freq: Once | ORAL | Status: AC
  Administered 2015-03-14: 25 mg via ORAL
  Filled 2015-03-14: qty 1

## 2015-03-14 MED ORDER — KETOROLAC TROMETHAMINE 30 MG/ML IJ SOLN
30.0000 mg | Freq: Once | INTRAMUSCULAR | Status: AC
  Administered 2015-03-14: 30 mg via INTRAVENOUS
  Filled 2015-03-14: qty 1

## 2015-03-14 NOTE — ED Provider Notes (Signed)
CSN: [id number]     Arrival date & time 03/14/15  1157 History   First MD Initiated Contact with Patient 03/14/15 1158     Chief Complaint  Patient presents with  . Dizziness  . Chest Pain  . Weakness     (Consider location/radiation/quality/duration/timing/severity/associated sxs/prior Treatment) HPI Patient presents with concern of ongoing lightheadedness/dizziness, intermittent chest pain. Symptoms have been present for about one month, and prior to that, she states that she was well. During this illness, patient has been evaluated once, at our affiliated facility. She states that since discharge she has been taking all medication as directed, including newly prescribed potassium. She complains of persistent lightheadedness, described as dizziness, worse with ambulation, head motion. There is associated generalized weakness, and occasional episodes of heaviness, left-sided chest. Chest heaviness is not always concurrent with dizziness exacerbations. Currently no pain, no confusion, dislocation, fever, chills, cough.     Past Medical History  Diagnosis Date  . Anxiety   . GERD (gastroesophageal reflux disease)   . Hx of cholecystectomy   . History of hysterectomy   . History of oophorectomy   . Depression   . Sciatica neuralgia, right   . Tobacco abuse 03/03/2015  . Near syncope 03/02/2015  . Bradycardia 03/02/2015   Past Surgical History  Procedure Laterality Date  . Abdominal hysterectomy     History reviewed. No pertinent family history. Social History  Substance Use Topics  . Smoking status: Current Every Day Smoker -- 0.25 packs/day    Types: Cigarettes  . Smokeless tobacco: None  . Alcohol Use: No   OB History    No data available     Review of Systems  Constitutional:       Per HPI, otherwise negative  HENT:       Per HPI, otherwise negative  Respiratory:       Per HPI, otherwise negative  Cardiovascular:       Per HPI, otherwise negative   Gastrointestinal: Negative for vomiting.  Endocrine:       Negative aside from HPI  Genitourinary:       Neg aside from HPI   Musculoskeletal:       Per HPI, otherwise negative  Skin: Negative.   Neurological: Positive for dizziness and light-headedness. Negative for syncope.      Allergies  Sulfa antibiotics  Home Medications   Prior to Admission medications   Medication Sig Start Date End Date Taking? Authorizing Provider  ALPRAZolam Prudy Feeler) 1 MG tablet Take 1 mg by mouth 4 (four) times daily as needed for anxiety or sleep (anxiety).  04/28/14   Historical Provider, MD  estrogen-methylTESTOSTERone (ESTRATEST) 1.25-2.5 MG per tablet Take 1 tablet by mouth daily. 04/08/14   Historical Provider, MD  hydrochlorothiazide (HYDRODIURIL) 12.5 MG tablet Take 1 tablet (12.5 mg total) by mouth daily. Take as needed for swelling 03/03/15   Elliot Cousin, MD  potassium chloride (K-DUR) 10 MEQ tablet Take 1 tablet (10 mEq total) by mouth daily as needed (Take with HCTZ.). 03/03/15   Elliot Cousin, MD   BP 116/72 mmHg  Pulse 56  Temp(Src) 98.2 F (36.8 C) (Oral)  Resp 19  Ht  (1.575 m)  Wt 144 lb (65.318 kg)  BMI 26.33 kg/m2  SpO2 100%  LMP 05/21/1997 Physical Exam  Constitutional: She is oriented to person, place, and time. She appears well-developed and well-nourished. No distress.  HENT:  Head: Normocephalic and atraumatic.  Eyes: Conjunctivae and EOM are normal.  Cardiovascular: Normal rate  and regular rhythm.   Pulmonary/Chest: Effort normal and breath sounds normal. No stridor. No respiratory distress.  Abdominal: She exhibits no distension.  Musculoskeletal: She exhibits no edema.  Neurological: She is alert and oriented to person, place, and time. She displays no atrophy and no tremor. No cranial nerve deficit. She exhibits normal muscle tone. She displays no seizure activity. Coordination normal.  Skin: Skin is warm and dry.  Psychiatric: She has a normal mood and  affect.  Nursing note and vitals reviewed.   ED Course  Procedures (including critical care time) Labs Review Labs Reviewed  CBC WITH DIFFERENTIAL/PLATELET - Abnormal; Notable for the following:    WBC 3.3 (*)    Neutro Abs 1.3 (*)    All other components within normal limits  COMPREHENSIVE METABOLIC PANEL    Imaging Review Ct Head Wo Contrast  03/14/2015  CLINICAL DATA:  Patient with instability dizziness and left-sided chest pain for 12 days. EXAM: CT HEAD WITHOUT CONTRAST TECHNIQUE: Contiguous axial images were obtained from the base of the skull through the vertex without intravenous contrast. COMPARISON:  CT brain 03/06/2011 FINDINGS: Ventricles and sulci are appropriate for patient's age. No evidence for acute cortically based infarct, intracranial hemorrhage, mass lesion mass-effect. Orbits are unremarkable. Paranasal sinuses are well aerated. Mastoid air cells are unremarkable. Calvarium is intact. IMPRESSION: No acute intracranial process. Electronically Signed   By: Annia Beltrew  Davis M.D.   On: 03/14/2015 13:55   I have personally reviewed and evaluated these images and lab results as part of my medical decision-making.   EKG Interpretation   Date/Time:  Monday March 14 2015 12:01:10 EDT Ventricular Rate:  48 PR Interval:  185 QRS Duration: 76 QT Interval:  422 QTC Calculation: 377 R Axis:   64 Text Interpretation:  Sinus bradycardia Sinus bradycardia Abnormal ekg  Confirmed by Gerhard MunchLOCKWOOD, Annalysse Shoemaker  MD ([id number]) on 03/14/2015 12:23:17 PM     2:59 PM patient resting, seemingly comfortably on repeat exam. We discussed all findings at length, including CT results, labs. Patient will start a course of medication for vertigo, follow up with neurology.   MDM  Patient presents with ongoing episodic lightheadedness, dizziness.  No objective neurologic deficits, but the patient is specific concern for intracranial pathology. Head CT, labs unremarkable, vital signs are unremarkable, and  with patient's lack of distress, symptoms most consistent with vertigo. Patient started on course medication, will follow up with primary care and neurology.  Gerhard Munchobert Suetta Hoffmeister, MD 03/14/15 1501

## 2015-03-14 NOTE — Discharge Instructions (Signed)
As discussed, your evaluation today has been largely reassuring.  But, it is important that you monitor your condition carefully, and do not hesitate to return to the ED if you develop new, or concerning changes in your condition.  Your symptoms are likely due to vertigo.  Please take all medication as directed.  Please follow-up with your physicians for appropriate ongoing care.

## 2015-03-14 NOTE — ED Notes (Signed)
Pt here via EMS for eval of intermittent CP starting several weeks ago with accompanying weakness and dizziness. Pt denies any recent illness or cough. Pt reports a squeezing feeling under left breast that radiated to back/shoulder. Pt is a/o x 4. Grips equal. Neuro intact. NO SOB.

## 2015-03-16 ENCOUNTER — Ambulatory Visit (HOSPITAL_BASED_OUTPATIENT_CLINIC_OR_DEPARTMENT_OTHER)
Admission: RE | Admit: 2015-03-16 | Discharge: 2015-03-16 | Disposition: A | Discharge status: Home / Self Care | Payer: Commercial Managed Care - HMO | Source: Ambulatory Visit | Attending: Cardiovascular Disease | Admitting: Cardiovascular Disease

## 2015-03-16 ENCOUNTER — Ambulatory Visit (HOSPITAL_COMMUNITY)
Admission: RE | Admit: 2015-03-16 | Discharge: 2015-03-16 | Disposition: A | Discharge status: Home / Self Care | Payer: Commercial Managed Care - HMO | Source: Ambulatory Visit | Attending: Cardiovascular Disease | Admitting: Cardiovascular Disease

## 2015-03-16 DIAGNOSIS — R079 Chest pain, unspecified: Secondary | ICD-10-CM | POA: Diagnosis not present

## 2015-03-16 DIAGNOSIS — R42 Dizziness and giddiness: Secondary | ICD-10-CM

## 2015-03-16 DIAGNOSIS — R072 Precordial pain: Secondary | ICD-10-CM

## 2015-03-16 LAB — EXERCISE TOLERANCE TEST
CHL CUP MPHR: 172 {beats}/min
CHL CUP RESTING HR STRESS: 65 {beats}/min
CSEPEDS: 35 s
Estimated workload: 13.4 METS
Exercise duration (min): 10 min
Peak HR: 148 {beats}/min
Percent HR: 86 %
RPE: 11

## 2015-03-22 ENCOUNTER — Telehealth: Payer: Self-pay

## 2015-03-22 ENCOUNTER — Institutional Professional Consult (permissible substitution): Payer: Commercial Managed Care - HMO | Admitting: Neurology

## 2015-03-22 NOTE — Telephone Encounter (Signed)
Pt did not show for their appt with Dr. Dohmeier today.  

## 2015-03-23 ENCOUNTER — Encounter: Payer: Self-pay | Admitting: Neurology

## 2015-04-19 ENCOUNTER — Encounter: Payer: Self-pay | Admitting: Neurology

## 2015-04-19 ENCOUNTER — Encounter (INDEPENDENT_AMBULATORY_CARE_PROVIDER_SITE_OTHER): Payer: Self-pay

## 2015-04-19 ENCOUNTER — Ambulatory Visit (INDEPENDENT_AMBULATORY_CARE_PROVIDER_SITE_OTHER): Payer: Commercial Managed Care - HMO | Admitting: Neurology

## 2015-04-19 VITALS — BP 108/68 | HR 78 | Resp 20 | Ht 62.0 in | Wt 150.0 lb

## 2015-04-19 DIAGNOSIS — F17200 Nicotine dependence, unspecified, uncomplicated: Secondary | ICD-10-CM | POA: Diagnosis not present

## 2015-04-19 DIAGNOSIS — R0683 Snoring: Secondary | ICD-10-CM | POA: Diagnosis not present

## 2015-04-19 NOTE — Patient Instructions (Signed)
Smoking Cessation, Tips for Success If you are ready to quit smoking, congratulations! You have chosen to help yourself be healthier. Cigarettes bring nicotine, tar, carbon monoxide, and other irritants into your body. Your lungs, heart, and blood vessels will be able to work better without these poisons. There are many different ways to quit smoking. Nicotine gum, nicotine patches, a nicotine inhaler, or nicotine nasal spray can help with physical craving. Hypnosis, support groups, and medicines help break the habit of smoking. WHAT THINGS CAN I DO TO MAKE QUITTING EASIER?  Here are some tips to help you quit for good:  Pick a date when you will quit smoking completely. Tell all of your friends and family about your plan to quit on that date.  Do not try to slowly cut down on the number of cigarettes you are smoking. Pick a quit date and quit smoking completely starting on that day.  Throw away all cigarettes.   Clean and remove all ashtrays from your home, work, and car.  On a card, write down your reasons for quitting. Carry the card with you and read it when you get the urge to smoke.  Cleanse your body of nicotine. Drink enough water and fluids to keep your urine clear or pale yellow. Do this after quitting to flush the nicotine from your body.  Learn to predict your moods. Do not let a bad situation be your excuse to have a cigarette. Some situations in your life might tempt you into wanting a cigarette.  Never have "just one" cigarette. It leads to wanting another and another. Remind yourself of your decision to quit.  Change habits associated with smoking. If you smoked while driving or when feeling stressed, try other activities to replace smoking. Stand up when drinking your coffee. Brush your teeth after eating. Sit in a different chair when you read the paper. Avoid alcohol while trying to quit, and try to drink fewer caffeinated beverages. Alcohol and caffeine may urge you to  smoke.  Avoid foods and drinks that can trigger a desire to smoke, such as sugary or spicy foods and alcohol.  Ask people who smoke not to smoke around you.  Have something planned to do right after eating or having a cup of coffee. For example, plan to take a walk or exercise.  Try a relaxation exercise to calm you down and decrease your stress. Remember, you may be tense and nervous for the first 2 weeks after you quit, but this will pass.  Find new activities to keep your hands busy. Play with a pen, coin, or rubber band. Doodle or draw things on paper.  Brush your teeth right after eating. This will help cut down on the craving for the taste of tobacco after meals. You can also try mouthwash.   Use oral substitutes in place of cigarettes. Try using lemon drops, carrots, cinnamon sticks, or chewing gum. Keep them handy so they are available when you have the urge to smoke.  When you have the urge to smoke, try deep breathing.  Designate your home as a nonsmoking area.  If you are a heavy smoker, ask your health care provider about a prescription for nicotine chewing gum. It can ease your withdrawal from nicotine.  Reward yourself. Set aside the cigarette money you save and buy yourself something nice.  Look for support from others. Join a support group or smoking cessation program. Ask someone at home or at work to help you with your plan   to quit smoking.  Always ask yourself, "Do I need this cigarette or is this just a reflex?" Tell yourself, "Today, I choose not to smoke," or "I do not want to smoke." You are reminding yourself of your decision to quit.  Do not replace cigarette smoking with electronic cigarettes (commonly called e-cigarettes). The safety of e-cigarettes is unknown, and some may contain harmful chemicals.  If you relapse, do not give up! Plan ahead and think about what you will do the next time you get the urge to smoke. HOW WILL I FEEL WHEN I QUIT SMOKING? You  may have symptoms of withdrawal because your body is used to nicotine (the addictive substance in cigarettes). You may crave cigarettes, be irritable, feel very hungry, cough often, get headaches, or have difficulty concentrating. The withdrawal symptoms are only temporary. They are strongest when you first quit but will go away within 10-14 days. When withdrawal symptoms occur, stay in control. Think about your reasons for quitting. Remind yourself that these are signs that your body is healing and getting used to being without cigarettes. Remember that withdrawal symptoms are easier to treat than the major diseases that smoking can cause.  Even after the withdrawal is over, expect periodic urges to smoke. However, these cravings are generally short lived and will go away whether you smoke or not. Do not smoke! WHAT RESOURCES ARE AVAILABLE TO HELP ME QUIT SMOKING? Your health care provider can direct you to community resources or hospitals for support, which may include:  Group support.  Education.  Hypnosis.  Therapy.   This information is not intended to replace advice given to you by your health care provider. Make sure you discuss any questions you have with your health care provider.   Document Released: 02/03/2004 Document Revised: 05/28/2014 Document Reviewed: 10/23/2012 Elsevier Interactive Patient Education 2016 [employer]  

## 2015-04-19 NOTE — Progress Notes (Signed)
SLEEP MEDICINE CLINIC   Provider:  Melvyn Novas, M D  Referring Provider: Avis Epley, PA* Primary Care Physician:  [facility]  Chief Complaint  Patient presents with  . New Patient (Initial Visit)    snoring, daytime sleepiness, has had a sleep study at [facility] in 2009, results available, rm 11, alone    HPI:  [name] [name] is a 49 y.o. female , seen here as a referral from PA  [place] for a sleep evaluation,  [name] [name] Knife works for the [employer] and has a remote history of nocturnal shift work. She remembers that it was not good for her and that she didn't feel well when she was working nights. She has since then remained this daytime work. She feels that her nocturnal sleep now is just enough and usually she is refreshed or restored in the morning. Almost 8 years ago she had undergone a sleep test at any [facility] in [facility]  Dr. Darleen Crocker A. [place] directed study-  the recording was essentially unremarkable ; the AHI was 0.8, there were no significant leg movements noted and she had an average heart rate in sinus rhythm of 57 bpm of sleep. The total recording time was 380 minutes. She had a very good sleep efficiency at 97%.  In the meantime the patient has gathered more information and she states that her boyfriend has reported her to snore thunderously- in any position!. Her youngest child also had witnessed her to stop breathing at night. All this led to the evaluation with the idea of revisiting the sleep problem today.   Sleep habits are as follows: The patient usually goes to the bedroom at  10:30 PM, and she reports falling asleep promptly. Her boyfriend has noted her to snore loudly when she sleeps on the side or on her back and a little less noisy when prone. But the fact remains that she seems to snore in all sleep positions to some degree. She reports sleeping usually soundly and deep that she is difficult to awaken.  Recently she had some neck pain and shoulder pain on the left side and has sometimes woken up from discomfort but that is new. The bedroom is described as cool, quiet and dark. She rises spontaneously at 5 AM in the morning. Usually she feels refreshed and restored, she does not report a headache or dry mouth in the morning. She said that the morning as her most favorite time of the day. She shares the bedroom with her boyfriend unless her snoring drives him into the guest room.  She doesn't nap in daytime.   She has to commute 30 minutes to her workplace, and her work environment is office-based with daylight access. She drinks coffee, about 3 cups in AM. She does not drink soda or iced tea. she is a smoker 1-2 cigs a day, she has cut down significantly and is trying to quit smoking. She does not drink any alcohol.   Sleep medical history and family sleep history:  No history of OSA or insomnia. Father snored, unknown if OSA. Social history: living with a boyfriend.She has to commute 30 minutes to her workplace, and her work environment is office-based with daylight access. She drinks coffee, about 3 cups in AM. She does not drink soda or iced tea. she is a smoker 1-2 cigs a day, she has cut down significantly and is trying to quit smoking. The patient had tried Chantix to support her  desire to quit smoking but the medication caused her to have vivid unpleasant dreams nightmarish in character and at one time she was witnessed to sleep walk. She discontinued Chantix and these events have not been  repeated.   She does not drink any alcohol.     Review of Systems: Out of a complete 14 system review, the patient complains of only the following symptoms, and all other reviewed systems are negative.   Epworth score 7  , Fatigue severity score 16  , depression score 2   Social History   Social History  . Marital Status: Married    Spouse Name: N/A  . Number of Children: N/A  . Years of  Education: N/A   Occupational History  . Not on file.   Social History Main Topics  . Smoking status: Current Every Day Smoker -- 0.25 packs/day for 20 years    Types: Cigarettes  . Smokeless tobacco: Not on file  . Alcohol Use: No  . Drug Use: 2.00 per week     Comment: marijuana maybe once a week  . Sexual Activity: Not on file   Other Topics Concern  . Not on file   Social History Narrative   Drinks 5-6 cups of coffee daily.    Family History  Problem Relation Age of Onset  . ALS Mother   . Cardiomyopathy Father     Past Medical History  Diagnosis Date  . Anxiety   . GERD (gastroesophageal reflux disease)   . Hx of cholecystectomy   . History of hysterectomy   . History of oophorectomy   . Depression   . Sciatica neuralgia, right   . Tobacco abuse 03/03/2015  . Near syncope 03/02/2015  . Bradycardia 03/02/2015    Past Surgical History  Procedure Laterality Date  . Abdominal hysterectomy    . Cholecystectomy    . Cesarean section      x2    Current Outpatient Prescriptions  Medication Sig Dispense Refill  . ALPRAZolam (XANAX) 0.5 MG tablet Take 0.5 mg by mouth 3 (three) times daily.    Marland Kitchen estrogen-methylTESTOSTERone (ESTRATEST) 1.25-2.5 MG per tablet Take 1 tablet by mouth daily.  5  . hydrochlorothiazide (HYDRODIURIL) 12.5 MG tablet Take 1 tablet (12.5 mg total) by mouth daily. Take as needed for swelling    . LORazepam (ATIVAN) 1 MG tablet Take 1 tablet (1 mg total) by mouth 3 (three) times daily as needed (dizziness / anxiety). 15 tablet 0  . meclizine (ANTIVERT) 25 MG tablet Take 25 mg by mouth 3 (three) times daily.  0  . omeprazole (PRILOSEC OTC) 20 MG tablet Take 20 mg by mouth at bedtime.    . pantoprazole (PROTONIX) 40 MG tablet Take 40 mg by mouth daily.  2  . potassium chloride (K-DUR) 10 MEQ tablet Take 1 tablet (10 mEq total) by mouth daily as needed (Take with HCTZ.). 30 tablet 2  . ranitidine (ZANTAC) 150 MG tablet Take 150 mg by mouth daily.      No current facility-administered medications for this visit.    Allergies as of 04/19/2015 - Review Complete 04/19/2015  Allergen Reaction Noted  . Sulfa antibiotics Hives and Nausea And Vomiting 03/06/2011    Vitals: BP 108/68 mmHg  Pulse 78  Resp 20  Ht  (1.575 m)  Wt 150 lb (68.04 kg)  BMI 27.43 kg/m2  LMP 05/21/1997 Last Weight:  Wt Readings from Last 1 Encounters:  04/19/15 150 lb (68.04 kg)   LOV:FIEP  mass index is 27.43 kg/(m^2).     Last Height:   Ht Readings from Last 1 Encounters:  04/19/15  (1.575 m)    Physical exam:  General: The patient is awake, alert and appears not in acute distress. The patient is well groomed. Head: Normocephalic, atraumatic. Neck is supple. Mallampati 2, short but not narrow or low. ,  neck circumference: 14. Nasal airflow unrestricted , no TMJ click  evident . Retrognathia is seen.  Cardiovascular:  Regular rate and rhythm, without  murmurs or carotid bruit, and without distended neck veins. Respiratory: Lungs are clear to auscultation. Skin:  Without evidence of edema, or rash Trunk: BMI , posture normal   Neurologic exam : The patient is awake and alert, oriented to place and time.   Memory subjective described as intact.  Attention span & concentration ability appears normal.  Speech is fluent,  without dysarthria, dysphonia or aphasia.  Mood and affect are appropriate.  Cranial nerves: Pupils are equal and briskly reactive to light. Funduscopic exam without  evidence of pallor . Extraocular movements  in vertical and horizontal planes intact and without nystagmus. Visual fields by finger perimetry are intact. Hearing to finger rub intact.   Facial sensation intact to fine touch.  Facial motor strength is symmetric and tongue and uvula move midline. Shoulder shrug was symmetrical.   Motor exam:   Normal tone, muscle bulk and symmetric strength in all extremities.  Sensory:  Fine touch, pinprick and vibration  were tested in all extremities. Proprioception tested in the upper extremities was normal.  Coordination: Rapid alternating movements in the fingers/hands was normal. Finger-to-nose maneuver normal without evidence of ataxia, dysmetria or tremor.  Gait and station: Patient walks without assistive device and is able unassisted to climb up to the exam table. Strength within normal limits.  Stance is stable and normal.  Toe and heel stand were unremarkable . Deep tendon reflexes: in the  upper and lower extremities are symmetric and intact. Babinski maneuver response is downgoing.  The patient was advised of the nature of the diagnosed sleep disorder , the treatment options and risks for general a health and wellness arising from not treating the condition.  I spent more than 30 minutes of face to face time with the patient. Greater than 50% of time was spent in counseling and coordination of care. We have discussed the diagnosis and differential and I answered the patient's questions.     Assessment:  After physical and neurologic examination, review of laboratory studies,  Personal review of imaging studies, reports of other /same  Imaging studies ,  Results of  2009 polysomnography/ neurophysiology testing and pre-existing records as far as provided in visit., my assessment is   1) [name] [name] Knife reports that she has been witnessed to snore but she has not felt that her sleep has a poor quality or is unrefreshing and unrestored. However multiple family members and her boyfriend have confirmed that she snores and at times may pass her breathing. 7 years ago a sleep study did not reveal sleep apnea but this may have changed in the meantime. [name] [name] Knife also does not have hypersomnia is not daytime fatigued or sleepy. I think the best thing to test her would be a home sleep test my goal is to screen her for apnea based on the reported snoring. Should apnea be found became invite her back for an in lab  sleep study but is certainly easier for her to have a screening  test in the comfort of her own home.  Plan:  Treatment plan and additional workup : HST , Rv after test with me.   Quit smoking support /education was  printed . This will be given to the patient. She already has reduced her nicotine use significantly I hope that she will find a way to quit completely.   Porfirio Mylararmen Chamya Hunton MD  04/19/2015   CC: Avis EpleySamantha J Jackson, Pa-c [address] [place], [place] [id number]    Elita QuickStacy Bumpus- Whitaker- [name] KnifeWilliams

## 2015-05-19 ENCOUNTER — Encounter (INDEPENDENT_AMBULATORY_CARE_PROVIDER_SITE_OTHER): Payer: Commercial Managed Care - HMO | Admitting: Neurology

## 2015-05-19 DIAGNOSIS — F17200 Nicotine dependence, unspecified, uncomplicated: Secondary | ICD-10-CM

## 2015-05-19 DIAGNOSIS — G4710 Hypersomnia, unspecified: Secondary | ICD-10-CM

## 2015-05-19 DIAGNOSIS — R0683 Snoring: Secondary | ICD-10-CM

## 2015-06-02 ENCOUNTER — Telehealth: Payer: Self-pay

## 2015-06-02 DIAGNOSIS — R0683 Snoring: Secondary | ICD-10-CM

## 2015-06-02 NOTE — Telephone Encounter (Signed)
Spoke to pt regarding her sleep study results. I advised her that her study only revealed snoring and treatment is optional. She reports that she does want to treat her snoring. I advised her that Dr. Vickey Hugerohmeier recommended that she proceed with an oral appliance or an ENT procedure/evaluation. I advised her to stop smoking. I advised her that if not contraindicated by her other physicians, to lose weight, diet, and exercise. I advised her to avoid driving or operating hazardous machinery while sleepy.  Pt wants to proceed with an oral appliance first. I advised her that we would refer her to an dentist that is able to make the oral appliance. She says that if the oral appliance does not work, she will call our office and ask for an ENT referral. Pt verbalized understanding.

## 2015-06-02 NOTE — Telephone Encounter (Signed)
Called pt to discuss sleep study results. No answer, left a message asking her to call me back. 

## 2015-06-02 NOTE — Telephone Encounter (Signed)
Pt returned Kristen's call °

## 2015-08-31 ENCOUNTER — Telehealth: Payer: Self-pay

## 2015-08-31 ENCOUNTER — Ambulatory Visit: Payer: Commercial Managed Care - HMO | Admitting: Neurology

## 2015-08-31 NOTE — Telephone Encounter (Signed)
I called pt to advise her that her appt today needs to be r/s because Dr. Vickey Hugerohmeier is out of the office. Pt is agreeable to coming in on 4/26 at 1:00. Pt is also asking me to change her name to [name] and remove "Mayford KnifeWilliams". Record updated.

## 2015-09-13 ENCOUNTER — Telehealth: Payer: Self-pay | Admitting: Neurology

## 2015-09-13 NOTE — Telephone Encounter (Signed)
Patient doesn't understand why she is scheduled for sleep consult, since she has already had this done. Please call to advise. (Patient cancelled appointment tomorrow due to being sick).

## 2015-09-13 NOTE — Telephone Encounter (Signed)
Spoke to pt and advised her that the reason she is scheduled for a sleep consult is because Terie PurserSamantha Jackson, [place] has sent another referral over asking Dr. Vickey Hugerohmeier to see her for further sleep problems. Pt verbalized understanding. I encouraged pt to call us back when she feels better and reschedule her appt.

## 2015-09-14 ENCOUNTER — Institutional Professional Consult (permissible substitution): Payer: Self-pay | Admitting: Neurology

## 2015-11-17 ENCOUNTER — Emergency Department (HOSPITAL_COMMUNITY)
Admission: EM | Admit: 2015-11-17 | Discharge: 2015-11-17 | Disposition: A | Discharge status: Home / Self Care | Payer: Commercial Managed Care - HMO | Attending: Emergency Medicine | Admitting: Emergency Medicine

## 2015-11-17 ENCOUNTER — Encounter (HOSPITAL_COMMUNITY): Payer: Self-pay | Admitting: Emergency Medicine

## 2015-11-17 DIAGNOSIS — Z79899 Other long term (current) drug therapy: Secondary | ICD-10-CM | POA: Diagnosis not present

## 2015-11-17 DIAGNOSIS — H5711 Ocular pain, right eye: Secondary | ICD-10-CM | POA: Diagnosis present

## 2015-11-17 DIAGNOSIS — Y939 Activity, unspecified: Secondary | ICD-10-CM | POA: Insufficient documentation

## 2015-11-17 DIAGNOSIS — F329 Major depressive disorder, single episode, unspecified: Secondary | ICD-10-CM | POA: Diagnosis not present

## 2015-11-17 DIAGNOSIS — F17210 Nicotine dependence, cigarettes, uncomplicated: Secondary | ICD-10-CM | POA: Insufficient documentation

## 2015-11-17 DIAGNOSIS — S0501XA Injury of conjunctiva and corneal abrasion without foreign body, right eye, initial encounter: Secondary | ICD-10-CM | POA: Diagnosis not present

## 2015-11-17 DIAGNOSIS — X58XXXA Exposure to other specified factors, initial encounter: Secondary | ICD-10-CM | POA: Insufficient documentation

## 2015-11-17 DIAGNOSIS — F1290 Cannabis use, unspecified, uncomplicated: Secondary | ICD-10-CM | POA: Diagnosis not present

## 2015-11-17 DIAGNOSIS — Z90710 Acquired absence of both cervix and uterus: Secondary | ICD-10-CM | POA: Insufficient documentation

## 2015-11-17 DIAGNOSIS — Y929 Unspecified place or not applicable: Secondary | ICD-10-CM | POA: Insufficient documentation

## 2015-11-17 DIAGNOSIS — Y999 Unspecified external cause status: Secondary | ICD-10-CM | POA: Diagnosis not present

## 2015-11-17 MED ORDER — FLUORESCEIN SODIUM 1 MG OP STRP
1.0000 | ORAL_STRIP | Freq: Once | OPHTHALMIC | Status: AC
  Administered 2015-11-17: 1 via OPHTHALMIC
  Filled 2015-11-17: qty 1

## 2015-11-17 MED ORDER — TETRACAINE HCL 0.5 % OP SOLN
2.0000 [drp] | Freq: Once | OPHTHALMIC | Status: AC
  Administered 2015-11-17: 2 [drp] via OPHTHALMIC
  Filled 2015-11-17: qty 4

## 2015-11-17 MED ORDER — POLYMYXIN B-TRIMETHOPRIM 10000-0.1 UNIT/ML-% OP SOLN: 1.0000 [drp] | OPHTHALMIC | Status: DC

## 2015-11-17 MED ORDER — NAPROXEN 500 MG PO TABS: 500.0000 mg | ORAL_TABLET | Freq: Two times a day (BID) | ORAL | Status: DC

## 2015-11-17 MED ORDER — TETRACAINE HCL 0.5 % OP SOLN
OPHTHALMIC | Status: AC
  Filled 2015-11-17: qty 4

## 2015-11-17 NOTE — Discharge Instructions (Signed)

## 2015-11-17 NOTE — ED Provider Notes (Signed)
CSN: [id number]     Arrival date & time 11/17/15  0554 History   First MD Initiated Contact with Patient 11/17/15 [phone]     Chief Complaint  Patient presents with  . Eye Pain     Patient is a 50 y.o. female presenting with eye pain. The history is provided by the patient.  Eye Pain This is a new problem. The current episode started 3 to 5 hours ago. The problem occurs constantly. The problem has not changed since onset.Pertinent negatives include no chest pain, no abdominal pain, no headaches and no shortness of breath. Exacerbated by: light, keeping her eye open. Nothing relieves the symptoms. She has tried nothing for the symptoms.  Pt woke up with eye pain several hours ago. The pain is in the right eye. She was sleeping and does not know of any injury. Denies any foreign body. No drainage from the eye but increased tears. No vomiting. No fever or headache. Pt use to wear contacts but has not for the last month  Past Medical History  Diagnosis Date  . Anxiety   . GERD (gastroesophageal reflux disease)   . Hx of cholecystectomy   . History of hysterectomy   . History of oophorectomy   . Depression   . Sciatica neuralgia, right   . Tobacco abuse 03/03/2015  . Near syncope 03/02/2015  . Bradycardia 03/02/2015   Past Surgical History  Procedure Laterality Date  . Abdominal hysterectomy    . Cholecystectomy    . Cesarean section      x2   Family History  Problem Relation Age of Onset  . ALS Mother   . Cardiomyopathy Father    Social History  Substance Use Topics  . Smoking status: Current Every Day Smoker -- 0.25 packs/day for 20 years    Types: Cigarettes  . Smokeless tobacco: None  . Alcohol Use: No   OB History    No data available     Review of Systems  Eyes: Positive for pain.  Respiratory: Negative for shortness of breath.   Cardiovascular: Negative for chest pain.  Gastrointestinal: Negative for abdominal pain.  Neurological: Negative for headaches.    All other systems reviewed and are negative.     Allergies  Sulfa antibiotics  Home Medications   Prior to Admission medications   Medication Sig Start Date End Date Taking? Authorizing Provider  ALPRAZolam Prudy Feeler(XANAX) 0.5 MG tablet Take 0.5 mg by mouth 3 (three) times daily.    Historical Provider, MD  estrogen-methylTESTOSTERone (ESTRATEST) 1.25-2.5 MG per tablet Take 1 tablet by mouth daily. 04/08/14   Historical Provider, MD  hydrochlorothiazide (HYDRODIURIL) 12.5 MG tablet Take 1 tablet (12.5 mg total) by mouth daily. Take as needed for swelling 03/03/15   Elliot Cousinenise Fisher, MD  LORazepam (ATIVAN) 1 MG tablet Take 1 tablet (1 mg total) by mouth 3 (three) times daily as needed (dizziness / anxiety). 03/14/15   Gerhard Munchobert Lockwood, MD  meclizine (ANTIVERT) 25 MG tablet Take 25 mg by mouth 3 (three) times daily. 03/14/15   Historical Provider, MD  naproxen (NAPROSYN) 500 MG tablet Take 1 tablet (500 mg total) by mouth 2 (two) times daily. 11/17/15   Linwood DibblesJon Myrtha Tonkovich, MD  omeprazole (PRILOSEC OTC) 20 MG tablet Take 20 mg by mouth at bedtime.    Historical Provider, MD  pantoprazole (PROTONIX) 40 MG tablet Take 40 mg by mouth daily. 03/23/15   Historical Provider, MD  potassium chloride (K-DUR) 10 MEQ tablet Take 1 tablet (10 mEq total)  by mouth daily as needed (Take with HCTZ.). 03/03/15   Elliot Cousinenise Fisher, MD  ranitidine (ZANTAC) 150 MG tablet Take 150 mg by mouth daily.    Historical Provider, MD  trimethoprim-polymyxin b (POLYTRIM) ophthalmic solution Place 1 drop into the right eye every 4 (four) hours. 11/17/15   Linwood DibblesJon Pattiann Solanki, MD   BP 122/78 mmHg  Pulse 51  Temp(Src) 97.9 F (36.6 C) (Oral)  Resp 16  Ht 5\' 2"  (1.575 m)  Wt 63.504 kg  BMI 25.60 kg/m2  SpO2 100%  LMP 05/21/1997 Physical Exam  Constitutional: She appears well-developed and well-nourished. No distress.  HENT:  Head: Normocephalic and atraumatic.  Right Ear: External ear normal.  Left Ear: External ear normal.  Eyes: EOM are normal.  Pupils are equal, round, and reactive to light. Right eye exhibits no chemosis, no discharge and no exudate. No foreign body present in the right eye. Left eye exhibits no chemosis, no discharge and no exudate. No foreign body present in the left eye. Right conjunctiva is injected. Right conjunctiva has no hemorrhage. No scleral icterus.    Neck: Neck supple. No tracheal deviation present.  Cardiovascular: Normal rate.   Pulmonary/Chest: Effort normal. No stridor. No respiratory distress.  Musculoskeletal: She exhibits no edema.  Neurological: She is alert. Cranial nerve deficit: no gross deficits.  Skin: Skin is warm and dry. No rash noted.  Psychiatric: She has a normal mood and affect.  Nursing note and vitals reviewed.   ED Course  Procedures (including critical care time)   MDM   Final diagnoses:  Corneal abrasion, right, initial encounter    Pt has a small Corneal abrasion on exam.  There is no clear history as to why this occurred. I everted her eyelids and don't see any evidence of any foreign body. It is  possible she may have rubbed her eye while she was sleeping. We will irrigate her eye while she is here. I will give her a prescription for antibiotic drops and recommend she follow up with an eye doctor in 1-2 days.    Linwood DibblesJon Ed Mandich, MD 11/17/15 [phone]

## 2015-11-17 NOTE — ED Notes (Signed)
Awakened from sleep with R eye pain- no known injury

## 2016-01-31 IMAGING — DX DG CHEST 2V
2 series · 2 of 2 positions shown · non-contrast
Comparison: 03/06/2011

CLINICAL DATA: Left-sided chest pain.  Near syncope.  Bradycardia.

EXAM:
CHEST  2 VIEW

[chest lat]
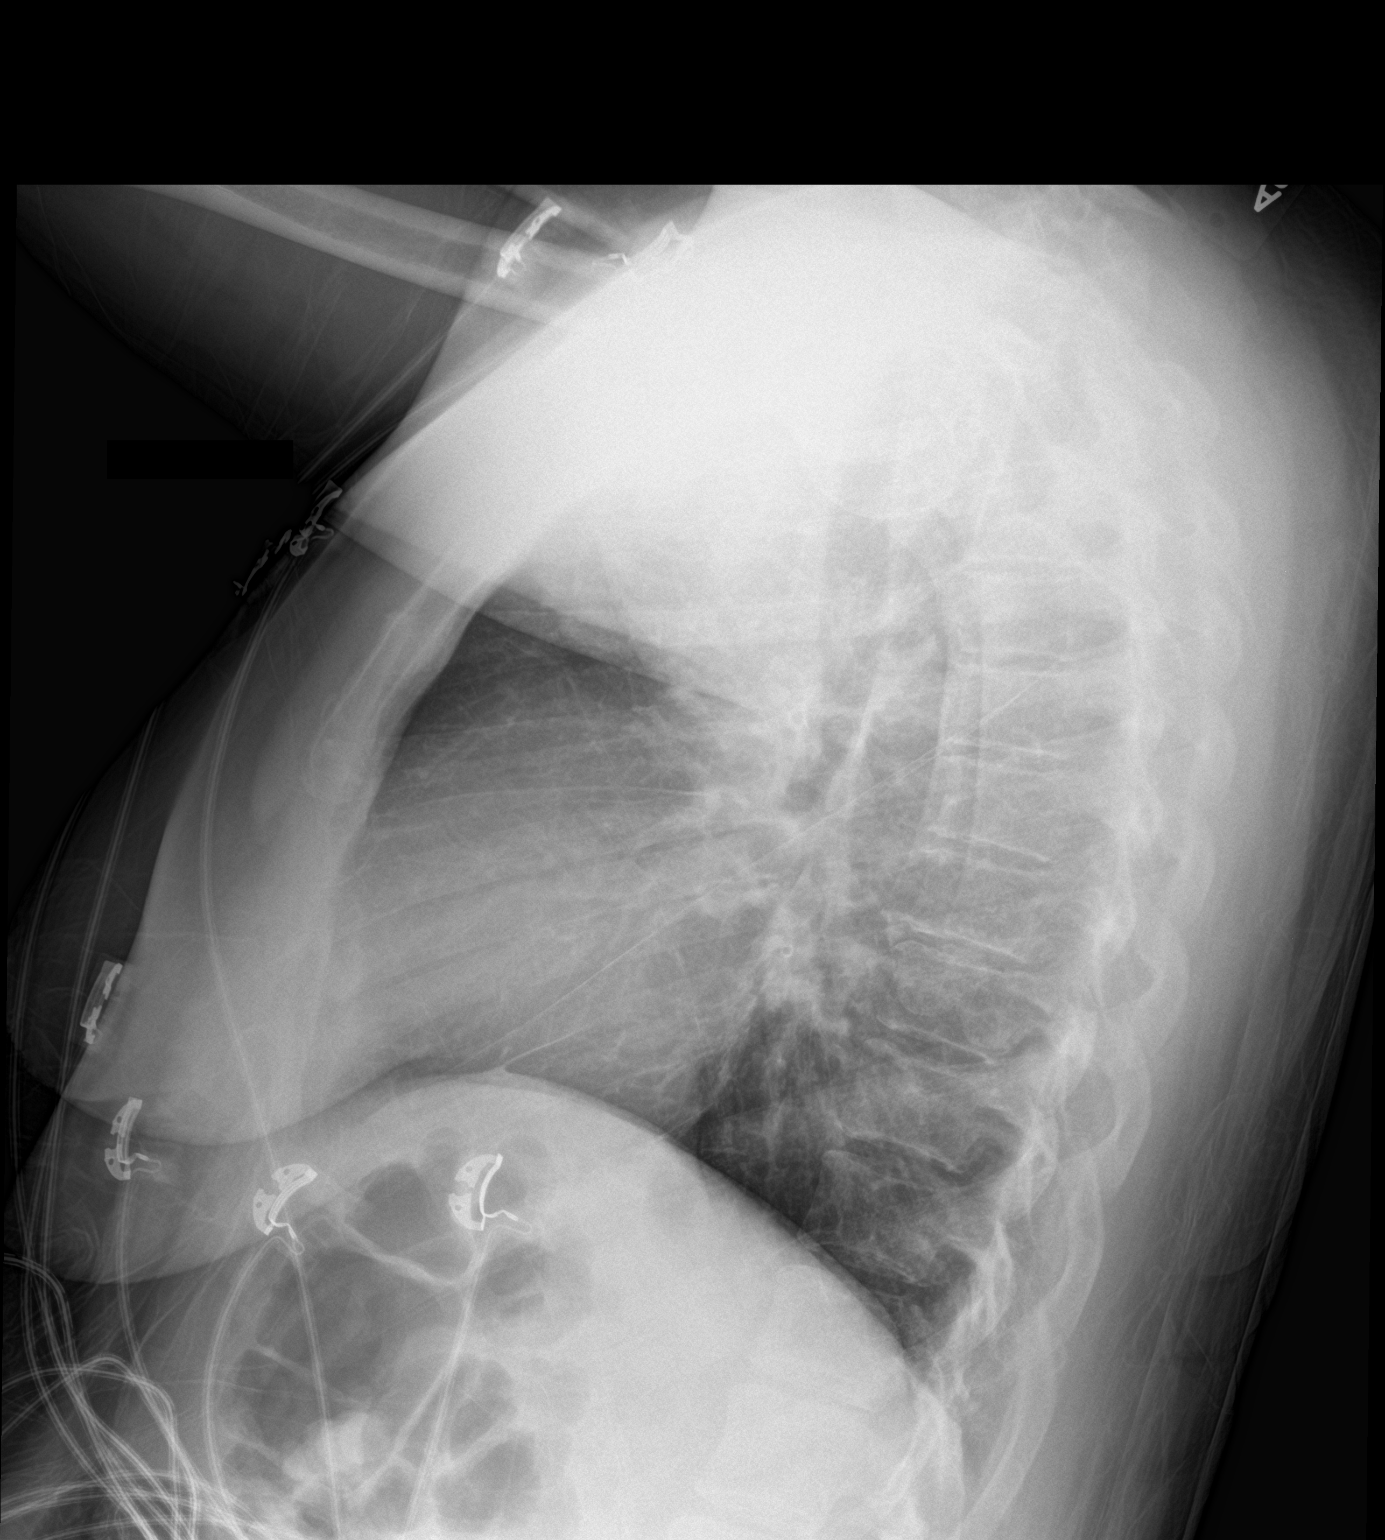

[chest ap]
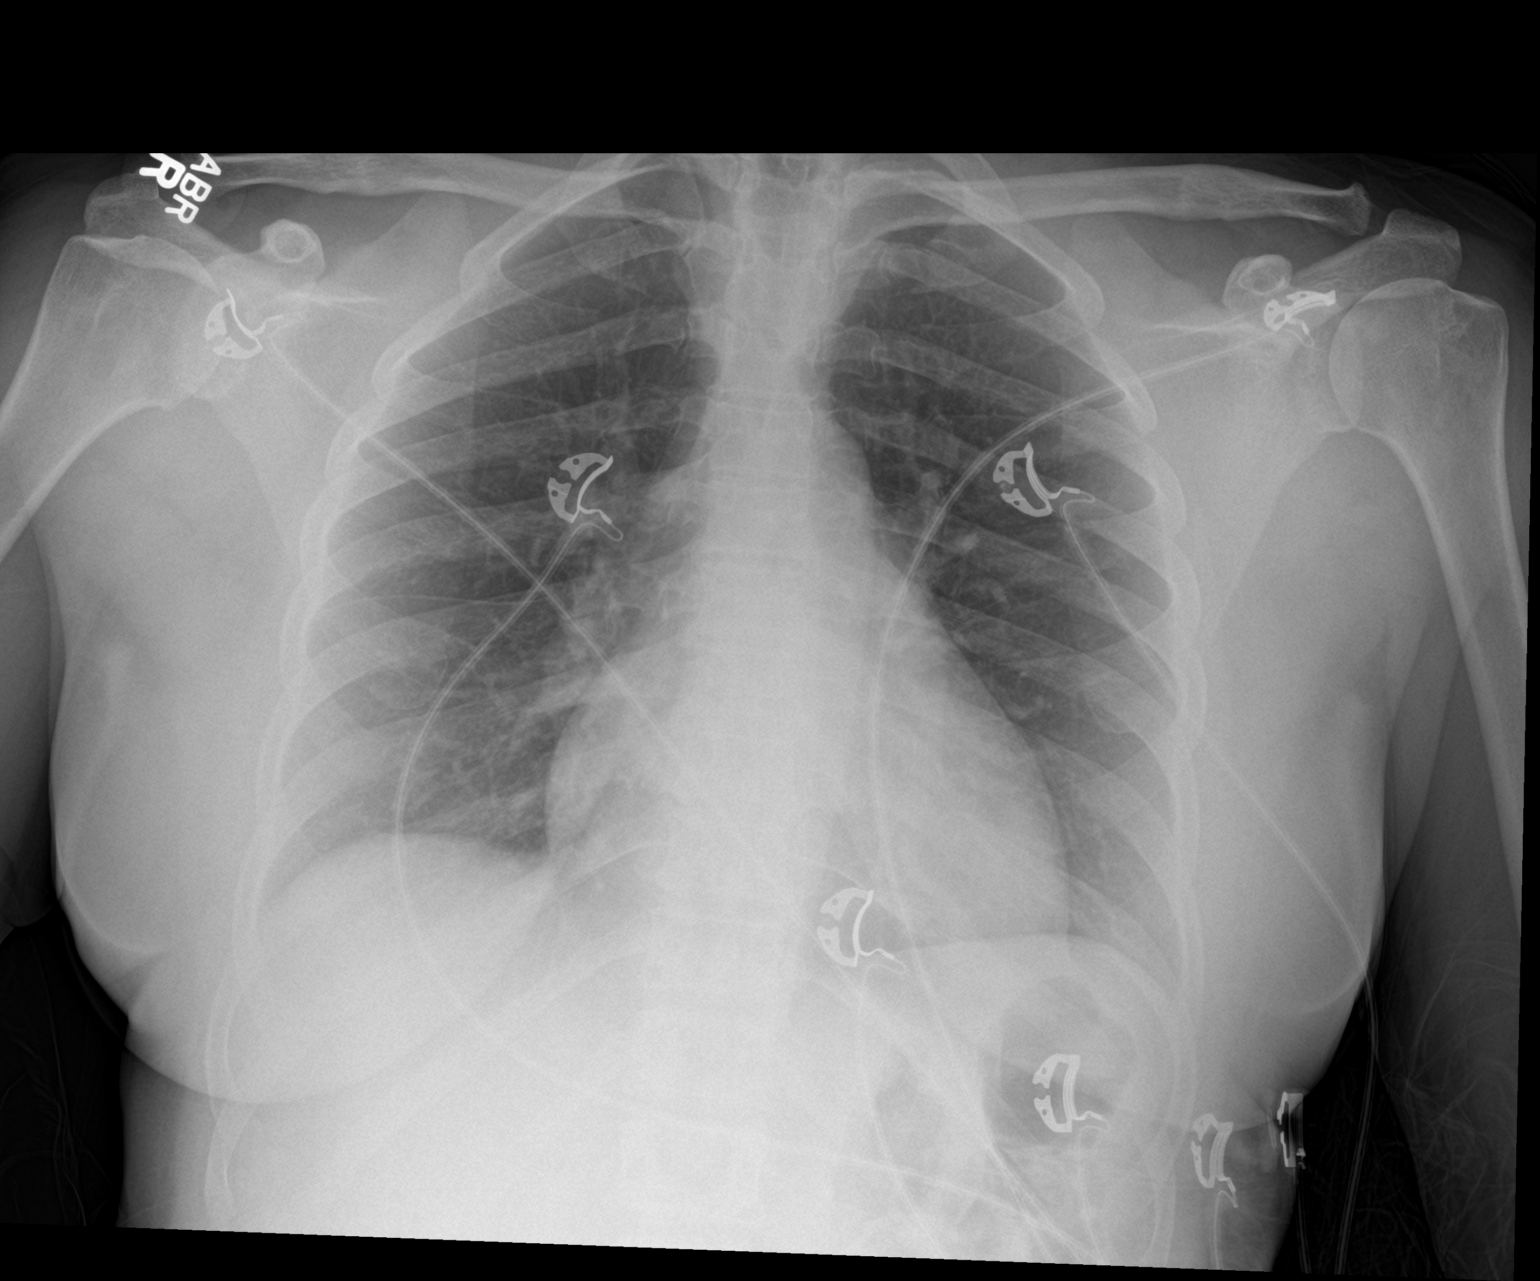

[2 of 2 positions shown; findings below may reference images not displayed]

FINDINGS: The heart size and mediastinal contours are within normal limits.
Both lungs are clear. The visualized skeletal structures are
unremarkable.
IMPRESSION: No active cardiopulmonary disease.

## 2016-02-27 ENCOUNTER — Other Ambulatory Visit: Payer: Self-pay | Admitting: Obstetrics and Gynecology

## 2016-08-01 DIAGNOSIS — K219 Gastro-esophageal reflux disease without esophagitis: Secondary | ICD-10-CM | POA: Diagnosis not present

## 2016-08-01 DIAGNOSIS — R609 Edema, unspecified: Secondary | ICD-10-CM | POA: Diagnosis not present

## 2016-08-01 DIAGNOSIS — Z1389 Encounter for screening for other disorder: Secondary | ICD-10-CM | POA: Diagnosis not present

## 2016-08-22 DIAGNOSIS — J069 Acute upper respiratory infection, unspecified: Secondary | ICD-10-CM | POA: Diagnosis not present

## 2016-08-22 DIAGNOSIS — R6889 Other general symptoms and signs: Secondary | ICD-10-CM | POA: Diagnosis not present

## 2016-08-22 DIAGNOSIS — J209 Acute bronchitis, unspecified: Secondary | ICD-10-CM | POA: Diagnosis not present

## 2016-08-22 DIAGNOSIS — J343 Hypertrophy of nasal turbinates: Secondary | ICD-10-CM | POA: Diagnosis not present

## 2016-09-07 ENCOUNTER — Emergency Department (HOSPITAL_COMMUNITY): Payer: Commercial Managed Care - HMO

## 2016-09-07 ENCOUNTER — Encounter (HOSPITAL_COMMUNITY): Payer: Self-pay | Admitting: *Deleted

## 2016-09-07 ENCOUNTER — Observation Stay (HOSPITAL_COMMUNITY)
Admission: EM | Admit: 2016-09-07 | Discharge: 2016-09-08 | Disposition: A | Discharge status: Home / Self Care | Payer: Commercial Managed Care - HMO | Attending: Internal Medicine | Admitting: Internal Medicine

## 2016-09-07 DIAGNOSIS — H538 Other visual disturbances: Secondary | ICD-10-CM | POA: Diagnosis not present

## 2016-09-07 DIAGNOSIS — F329 Major depressive disorder, single episode, unspecified: Secondary | ICD-10-CM | POA: Diagnosis not present

## 2016-09-07 DIAGNOSIS — R200 Anesthesia of skin: Secondary | ICD-10-CM | POA: Diagnosis not present

## 2016-09-07 DIAGNOSIS — E876 Hypokalemia: Secondary | ICD-10-CM | POA: Insufficient documentation

## 2016-09-07 DIAGNOSIS — F17210 Nicotine dependence, cigarettes, uncomplicated: Secondary | ICD-10-CM | POA: Diagnosis not present

## 2016-09-07 DIAGNOSIS — H53132 Sudden visual loss, left eye: Secondary | ICD-10-CM | POA: Diagnosis not present

## 2016-09-07 DIAGNOSIS — Z720 Tobacco use: Secondary | ICD-10-CM | POA: Diagnosis present

## 2016-09-07 DIAGNOSIS — R51 Headache: Secondary | ICD-10-CM

## 2016-09-07 DIAGNOSIS — F419 Anxiety disorder, unspecified: Secondary | ICD-10-CM | POA: Insufficient documentation

## 2016-09-07 DIAGNOSIS — Z79899 Other long term (current) drug therapy: Secondary | ICD-10-CM | POA: Insufficient documentation

## 2016-09-07 DIAGNOSIS — H5462 Unqualified visual loss, left eye, normal vision right eye: Secondary | ICD-10-CM | POA: Diagnosis not present

## 2016-09-07 DIAGNOSIS — K219 Gastro-esophageal reflux disease without esophagitis: Secondary | ICD-10-CM | POA: Diagnosis not present

## 2016-09-07 DIAGNOSIS — R519 Headache, unspecified: Secondary | ICD-10-CM

## 2016-09-07 DIAGNOSIS — H5310 Unspecified subjective visual disturbances: Secondary | ICD-10-CM | POA: Diagnosis not present

## 2016-09-07 HISTORY — DX: Dizziness and giddiness: R42

## 2016-09-07 LAB — DIFFERENTIAL
BASOS ABS: 0 10*3/uL (ref 0.0–0.1)
BASOS PCT: 0 %
Eosinophils Absolute: 0.1 10*3/uL (ref 0.0–0.7)
Eosinophils Relative: 1 %
LYMPHS ABS: 1.6 10*3/uL (ref 0.7–4.0)
Lymphocytes Relative: 23 %
Monocytes Absolute: 0.4 10*3/uL (ref 0.1–1.0)
Monocytes Relative: 6 %
NEUTROS PCT: 70 %
Neutro Abs: 4.7 10*3/uL (ref 1.7–7.7)

## 2016-09-07 LAB — C-REACTIVE PROTEIN

## 2016-09-07 LAB — I-STAT CHEM 8, ED
BUN: 11 mg/dL (ref 6–20)
CREATININE: 1 mg/dL (ref 0.44–1.00)
Calcium, Ion: 1.17 mmol/L (ref 1.15–1.40)
Chloride: 98 mmol/L — ABNORMAL LOW (ref 101–111)
Glucose, Bld: 108 mg/dL — ABNORMAL HIGH (ref 65–99)
HEMATOCRIT: 44 % (ref 36.0–46.0)
HEMOGLOBIN: 15 g/dL (ref 12.0–15.0)
POTASSIUM: 2.8 mmol/L — AB (ref 3.5–5.1)
Sodium: 140 mmol/L (ref 135–145)
TCO2: 29 mmol/L (ref 0–100)

## 2016-09-07 LAB — VITAMIN B12: VITAMIN B 12: 323 pg/mL (ref 180–914)

## 2016-09-07 LAB — COMPREHENSIVE METABOLIC PANEL
ALBUMIN: 4.2 g/dL (ref 3.5–5.0)
ALT: 20 U/L (ref 14–54)
AST: 24 U/L (ref 15–41)
Alkaline Phosphatase: 69 U/L (ref 38–126)
Anion gap: 9 (ref 5–15)
BUN: 11 mg/dL (ref 6–20)
CHLORIDE: 98 mmol/L — AB (ref 101–111)
CO2: 28 mmol/L (ref 22–32)
CREATININE: 0.99 mg/dL (ref 0.44–1.00)
Calcium: 9.6 mg/dL (ref 8.9–10.3)
GFR calc Af Amer: >60 mL/min (ref >60)
GFR calc non Af Amer: >60 mL/min (ref >60)
Glucose, Bld: 112 mg/dL — ABNORMAL HIGH (ref 65–99)
POTASSIUM: 2.8 mmol/L — AB (ref 3.5–5.1)
SODIUM: 135 mmol/L (ref 135–145)
TOTAL PROTEIN: 7.8 g/dL (ref 6.5–8.1)
Total Bilirubin: 0.7 mg/dL (ref 0.3–1.2)

## 2016-09-07 LAB — RAPID URINE DRUG SCREEN, HOSP PERFORMED
AMPHETAMINES: POSITIVE — AB
Barbiturates: NOT DETECTED
Benzodiazepines: POSITIVE — AB
COCAINE: NOT DETECTED
OPIATES: POSITIVE — AB
TETRAHYDROCANNABINOL: POSITIVE — AB

## 2016-09-07 LAB — PROTIME-INR
INR: 0.99
PROTHROMBIN TIME: 13.1 s (ref 11.4–15.2)

## 2016-09-07 LAB — ETHANOL: Alcohol, Ethyl (B): <5 mg/dL (ref <5)

## 2016-09-07 LAB — CBC
HCT: 42.6 % (ref 36.0–46.0)
HEMOGLOBIN: 14.8 g/dL (ref 12.0–15.0)
MCH: 32.5 pg (ref 26.0–34.0)
MCHC: 34.7 g/dL (ref 30.0–36.0)
MCV: 93.4 fL (ref 78.0–100.0)
PLATELETS: 220 10*3/uL (ref 150–400)
RBC: 4.56 MIL/uL (ref 3.87–5.11)
RDW: 12.6 % (ref 11.5–15.5)
WBC: 6.8 10*3/uL (ref 4.0–10.5)

## 2016-09-07 LAB — APTT: APTT: 31 s (ref 24–36)

## 2016-09-07 LAB — SEDIMENTATION RATE: SED RATE: 7 mm/h (ref 0–22)

## 2016-09-07 LAB — URINALYSIS, ROUTINE W REFLEX MICROSCOPIC
Bilirubin Urine: NEGATIVE
GLUCOSE, UA: NEGATIVE mg/dL
Hgb urine dipstick: NEGATIVE
Ketones, ur: NEGATIVE mg/dL
LEUKOCYTES UA: NEGATIVE
Nitrite: NEGATIVE
PH: 7 (ref 5.0–8.0)
Protein, ur: NEGATIVE mg/dL
Specific Gravity, Urine: 1.016 (ref 1.005–1.030)

## 2016-09-07 LAB — TSH: TSH: 1.233 u[IU]/mL (ref 0.350–4.500)

## 2016-09-07 LAB — MAGNESIUM: MAGNESIUM: 1.9 mg/dL (ref 1.7–2.4)

## 2016-09-07 MED ORDER — ALPRAZOLAM 0.5 MG PO TABS
0.5000 mg | ORAL_TABLET | Freq: Three times a day (TID) | ORAL | Status: DC | PRN
  Administered 2016-09-07: 0.5 mg via ORAL
  Filled 2016-09-07: qty 1

## 2016-09-07 MED ORDER — POTASSIUM CHLORIDE IN NACL 40-0.9 MEQ/L-% IV SOLN
INTRAVENOUS | Status: DC
  Administered 2016-09-07: 75 mL/h via INTRAVENOUS
  Filled 2016-09-07 (×2): qty 1000

## 2016-09-07 MED ORDER — ACETAMINOPHEN 500 MG PO TABS
1000.0000 mg | ORAL_TABLET | Freq: Once | ORAL | Status: AC
  Administered 2016-09-07: 1000 mg via ORAL
  Filled 2016-09-07: qty 2

## 2016-09-07 MED ORDER — ONDANSETRON HCL 4 MG/2ML IJ SOLN
4.0000 mg | Freq: Four times a day (QID) | INTRAMUSCULAR | Status: DC | PRN
  Administered 2016-09-07: 4 mg via INTRAVENOUS
  Filled 2016-09-07: qty 2

## 2016-09-07 MED ORDER — MORPHINE SULFATE (PF) 4 MG/ML IV SOLN
4.0000 mg | Freq: Once | INTRAVENOUS | Status: AC
  Administered 2016-09-07: 4 mg via INTRAVENOUS
  Filled 2016-09-07: qty 1

## 2016-09-07 MED ORDER — MECLIZINE HCL 12.5 MG PO TABS
25.0000 mg | ORAL_TABLET | Freq: Three times a day (TID) | ORAL | Status: DC
Start: 2016-09-07 — End: 2016-09-08
  Administered 2016-09-07 – 2016-09-08 (×2): 25 mg via ORAL
  Filled 2016-09-07 (×2): qty 2

## 2016-09-07 MED ORDER — ONDANSETRON HCL 4 MG PO TABS: 4.0000 mg | ORAL_TABLET | Freq: Four times a day (QID) | ORAL | Status: DC | PRN

## 2016-09-07 MED ORDER — POTASSIUM CHLORIDE CRYS ER 20 MEQ PO TBCR
40.0000 meq | EXTENDED_RELEASE_TABLET | Freq: Once | ORAL | Status: AC
Start: 2016-09-07 — End: 2016-09-07
  Administered 2016-09-07: 40 meq via ORAL
  Filled 2016-09-07: qty 2

## 2016-09-07 MED ORDER — ACETAMINOPHEN 650 MG RE SUPP: 650.0000 mg | Freq: Four times a day (QID) | RECTAL | Status: DC | PRN

## 2016-09-07 MED ORDER — ACETAMINOPHEN 325 MG PO TABS
650.0000 mg | ORAL_TABLET | Freq: Four times a day (QID) | ORAL | Status: DC | PRN
  Administered 2016-09-07: 650 mg via ORAL
  Filled 2016-09-07: qty 2

## 2016-09-07 MED ORDER — PANTOPRAZOLE SODIUM 40 MG PO TBEC
40.0000 mg | DELAYED_RELEASE_TABLET | Freq: Every day | ORAL | Status: DC
  Administered 2016-09-08: 40 mg via ORAL
  Filled 2016-09-07: qty 1

## 2016-09-07 MED ORDER — ONDANSETRON HCL 4 MG/2ML IJ SOLN
4.0000 mg | Freq: Once | INTRAMUSCULAR | Status: AC
  Administered 2016-09-07: 4 mg via INTRAVENOUS
  Filled 2016-09-07: qty 2

## 2016-09-07 MED ORDER — FAMOTIDINE 20 MG PO TABS
20.0000 mg | ORAL_TABLET | Freq: Every day | ORAL | Status: DC
  Administered 2016-09-08: 20 mg via ORAL
  Filled 2016-09-07: qty 1

## 2016-09-07 MED ORDER — NAPROXEN 250 MG PO TABS
500.0000 mg | ORAL_TABLET | Freq: Two times a day (BID) | ORAL | Status: DC
  Administered 2016-09-08: 500 mg via ORAL
  Filled 2016-09-07: qty 2

## 2016-09-07 NOTE — ED Notes (Addendum)
Pt BIB by [facility] for admission from [facility] Per [facility], pt being admitted for observation r/t "slight left sided drifts, ha, and blurry vision," code stroke had previously been called and canceled while pt was at [facility]. Pt ambulated from stretcher to bed, gait even. Resp e/u; NAD noted. Pt seen by [facility]; waiting on ophthalmologist.

## 2016-09-07 NOTE — ED Notes (Signed)
Patient returned from MRI.

## 2016-09-07 NOTE — ED Notes (Signed)
Patient left with [facility] at this time for transport to [facility] ED.

## 2016-09-07 NOTE — H&P (Signed)
History and Physical    EVANGELENE VORA WEX:[id number] DOB: [birth year] DOA: 09/07/2016  PCP: [place]  Optometrist: Barbie Haggis, OD  Patient coming from: home  I have personally briefly reviewed patient's old medical records in [place]  Chief Complaint: headache, loss of vision left eye  HPI: STEPHENIE [name] is a 51 y.o. female with medical history significant of GERD, tobacco abuse and anxiety presents with sudden onset left vision loss and left sided headache that began this morning. Patient woke up with symptoms. Yesterday she was in her usual state of health. She describes vision loss in her left eye that has persisted since this morning. She describes a headache in the left parietal area. She denies any rashes. She also had numbness and weakness of her left side. She felt it was difficult to walk this morning and was stumbling and falling. No shortness of breath or chest pain. No fever, cough or dysuria. She reports having an eye exam with her optometrist 2 weeks ago which she reports was unremarkable.  ED Course: on evaluation in ED, initially code stroke was called, but time of onset of symptoms could not be determined. CT head and MRI brain were found to be negative. She has been referred for admission  Review of Systems: As per HPI otherwise 10 point review of systems negative.   Past Medical History:  Diagnosis Date  . Anxiety   . Bradycardia 03/02/2015  . Depression   . GERD (gastroesophageal reflux disease)   . History of hysterectomy   . History of oophorectomy   . Hx of cholecystectomy   . Near syncope 03/02/2015  . Sciatica neuralgia, right   . Tobacco abuse 03/03/2015  . Vertigo     Past Surgical History:  Procedure Laterality Date  . ABDOMINAL HYSTERECTOMY    . CESAREAN SECTION     x2  . CHOLECYSTECTOMY       reports that she has been smoking Cigarettes.  She has a 2.00 pack-year smoking history. She has never used  smokeless tobacco. She reports that she uses drugs about 2 times per week. She reports that she does not drink alcohol.  Allergies  Allergen Reactions  . Sulfa Antibiotics Hives and Nausea And Vomiting    Family History  Problem Relation Age of Onset  . ALS Mother   . Cardiomyopathy Father     Prior to Admission medications   Medication Sig Start Date End Date Taking? Authorizing Provider  ALPRAZolam Duanne Moron) 0.5 MG tablet Take 0.5 mg by mouth 3 (three) times daily.   Yes Historical Provider, MD  estrogen-methylTESTOSTERone (ESTRATEST) 1.25-2.5 MG per tablet Take 1 tablet by mouth daily. 04/08/14  Yes Historical Provider, MD  hydrochlorothiazide (HYDRODIURIL) 12.5 MG tablet Take 1 tablet (12.5 mg total) by mouth daily. Take as needed for swelling 03/03/15  Yes Rexene Alberts, MD  LORazepam (ATIVAN) 1 MG tablet Take 1 tablet (1 mg total) by mouth 3 (three) times daily as needed (dizziness / anxiety). 03/14/15  Yes Carmin Muskrat, MD  meclizine (ANTIVERT) 25 MG tablet Take 25 mg by mouth 3 (three) times daily. 03/14/15  Yes Historical Provider, MD  naproxen (NAPROSYN) 500 MG tablet Take 1 tablet (500 mg total) by mouth 2 (two) times daily. 11/17/15  Yes Dorie Rank, MD  omeprazole (PRILOSEC OTC) 20 MG tablet Take 20 mg by mouth at bedtime.   Yes Historical Provider, MD  pantoprazole (PROTONIX) 40 MG tablet Take 40 mg by mouth daily. 03/23/15  Yes Historical Provider, MD  potassium chloride (K-DUR) 10 MEQ tablet Take 1 tablet (10 mEq total) by mouth daily as needed (Take with HCTZ.). 03/03/15  Yes Rexene Alberts, MD  ranitidine (ZANTAC) 150 MG tablet Take 150 mg by mouth daily.   Yes Historical Provider, MD  trimethoprim-polymyxin b (POLYTRIM) ophthalmic solution Place 1 drop into the right eye every 4 (four) hours. 11/17/15  Yes Dorie Rank, MD    Physical Exam: Vitals:   09/07/16 1400 09/07/16 1500 09/07/16 1530 09/07/16 1602  BP: 112/80 114/62 98/67 109/87  Pulse: (!) 26 72 65   Resp: 16 (!)  24 15   Temp:      TempSrc:      SpO2: 96% 98% 100%     Constitutional: NAD, calm, comfortable Vitals:   09/07/16 1400 09/07/16 1500 09/07/16 1530 09/07/16 1602  BP: 112/80 114/62 98/67 109/87  Pulse: (!) 26 72 65   Resp: 16 (!) 24 15   Temp:      TempSrc:      SpO2: 96% 98% 100%    Eyes: PERRL, lids and conjunctivae normal, slight tenderness over left temple, no rash ENMT: Mucous membranes are moist. Posterior pharynx clear of any exudate or lesions.Normal dentition.  Neck: normal, supple, no masses, no thyromegaly Respiratory: clear to auscultation bilaterally, no wheezing, no crackles. Normal respiratory effort. No accessory muscle use.  Cardiovascular: Regular rate and rhythm, no murmurs / rubs / gallops. No extremity edema. 2+ pedal pulses. No carotid bruits.  Abdomen: no tenderness, no masses palpated. No hepatosplenomegaly. Bowel sounds positive.  Musculoskeletal: no clubbing / cyanosis. No joint deformity upper and lower extremities. Good ROM, no contractures. Normal muscle tone.  Skin: no rashes, lesions, ulcers. No induration Neurologic: no facial asymmetry, slight pronator drift on left, 4/5 strength in left upper and lower extremities, 5/5 in right upper and lower extremities.  Psychiatric: Normal judgment and insight. Alert and oriented x 3. Normal mood.     Labs on Admission: I have personally reviewed following labs and imaging studies  CBC:  Recent Labs Lab 09/07/16 1045 09/07/16 1110  WBC 6.8  --   NEUTROABS 4.7  --   HGB 14.8 15.0  HCT 42.6 44.0  MCV 93.4  --   PLT 220  --    Basic Metabolic Panel:  Recent Labs Lab 09/07/16 1045 09/07/16 1110  NA 135 140  K 2.8* 2.8*  CL 98* 98*  CO2 28  --   GLUCOSE 112* 108*  BUN 11 11  CREATININE 0.99 1.00  CALCIUM 9.6  --    GFR: CrCl cannot be calculated (Unknown ideal weight.). Liver Function Tests:  Recent Labs Lab 09/07/16 1045  AST 24  ALT 20  ALKPHOS 69  BILITOT 0.7  PROT 7.8  ALBUMIN  4.2   No results for input(s): LIPASE, AMYLASE in the last 168 hours. No results for input(s): AMMONIA in the last 168 hours. Coagulation Profile:  Recent Labs Lab 09/07/16 1045  INR 0.99   Cardiac Enzymes: No results for input(s): CKTOTAL, CKMB, CKMBINDEX, TROPONINI in the last 168 hours. BNP (last 3 results) No results for input(s): PROBNP in the last 8760 hours. HbA1C: No results for input(s): HGBA1C in the last 72 hours. CBG: No results for input(s): GLUCAP in the last 168 hours. Lipid Profile: No results for input(s): CHOL, HDL, LDLCALC, TRIG, CHOLHDL, LDLDIRECT in the last 72 hours. Thyroid Function Tests: No results for input(s): TSH, T4TOTAL, FREET4, T3FREE, THYROIDAB in the last 72 hours.  Anemia Panel: No results for input(s): VITAMINB12, FOLATE, FERRITIN, TIBC, IRON, RETICCTPCT in the last 72 hours. Urine analysis:    Component Value Date/Time   COLORURINE YELLOW 09/07/2016 1340   APPEARANCEUR HAZY (A) 09/07/2016 1340   LABSPEC 1.016 09/07/2016 1340   PHURINE 7.0 09/07/2016 1340   GLUCOSEU NEGATIVE 09/07/2016 1340   HGBUR NEGATIVE 09/07/2016 1340   BILIRUBINUR NEGATIVE 09/07/2016 1340   KETONESUR NEGATIVE 09/07/2016 1340   PROTEINUR NEGATIVE 09/07/2016 1340   UROBILINOGEN 0.2 03/02/2015 2100   NITRITE NEGATIVE 09/07/2016 1340   LEUKOCYTESUR NEGATIVE 09/07/2016 1340    Radiological Exams on Admission: Mr Brain Wo Contrast (neuro Protocol)  Result Date: 09/07/2016 CLINICAL DATA:  Left parietal headache.  Rule out CVA EXAM: MRI HEAD WITHOUT CONTRAST TECHNIQUE: Multiplanar, multiecho pulse sequences of the brain and surrounding structures were obtained without intravenous contrast. COMPARISON:  CT head 09/07/2016 FINDINGS: Brain: Negative for acute or chronic infarction. Negative for hemorrhage, mass, or fluid collection. Ventricle size is normal. Vascular: Normal arterial flow voids. Skull and upper cervical spine: Negative Sinuses/Orbits: Negative Other: None  IMPRESSION: Negative MRI of the brain. Electronically Signed   By: Franchot Gallo M.D.   On: 09/07/2016 12:24   Ct Head Code Stroke Wo Contrast`  Result Date: 09/07/2016 CLINICAL DATA:  Code stroke. Awoke at 5 a.m. with severe headache. Left eye vision changes. Left arm and leg numbness. EXAM: CT HEAD WITHOUT CONTRAST TECHNIQUE: Contiguous axial images were obtained from the base of the skull through the vertex without intravenous contrast. COMPARISON:  03/14/2015 FINDINGS: Brain: No evidence of acute infarction, hemorrhage, hydrocephalus, extra-axial collection or mass lesion/mass effect. Empty sella appearance, stable and incidental. Vascular: No hyperdense vessel or unexpected calcification. Skull: Normal. Negative for fracture or focal lesion. Sinuses/Orbits: No acute finding. Other: These results were called by telephone at the time of interpretation on 09/07/2016 at 11:20 am to Dr. Nat Christen , who verbally acknowledged these results. ASPECTS [facility] Stroke Program Early CT Score) - Ganglionic level infarction (caudate, lentiform nuclei, internal capsule, insula, M1-M3 cortex): 7 - Supraganglionic infarction (M4-M6 cortex): 3 Total score (0-10 with 10 being normal): 10 IMPRESSION: Negative head CT. ASPECTS is 10. Electronically Signed   By: Monte Fantasia M.D.   On: 09/07/2016 11:21    EKG: Independently reviewed. Sinus rhythm without acute changes  Assessment/Plan Active Problems:   Hypokalemia   Tobacco abuse   Headache   Vision, loss, sudden, left   Left sided numbness   GERD (gastroesophageal reflux disease)   Vision loss, left eye     1. Sudden vision loss, left eye. Etiology is unclear. She does not have any significant tenderness around eye, and gross exam of eye is unremarkable. Visual acuity is 20/40 in right eye and 20/200 in left eye. I have discussed her case with Dr. Satira Sark at [facility] ophthalmology who has recommended that patient be transferred to [facility] for urgent eye exam.  ESR and CRP checked and were normal  2. Left sided numbness/weakness. Unclear etiology. MRI brain negative for stroke. I have discussed case with Dr. Shon Hale on call for neurology who will evaluation patient at [facility]. Checking B12, TSH  3. Hypokalemia. May be playing a role in #2. Check magnesium. Likely related to HCTZ. Replace.  4. GERD. Continue PPI  5. Headache. Continue supportive treatment. Imaging has been unremarkable   DVT prophylaxis: SCDs Code Status: full code Family Communication: discussed with family members at the bedside Disposition Plan: transfer to [facility] for further evaluation Consults called:  ophthalmology Dr. Satira Sark, neurology Dr. Shon Hale Admission status: observation/tele   Cloa Bushong MD [facility] Pager [phone]  If 7PM-7AM, please contact night-coverage [email or web address] Password [facility]  09/07/2016, 4:38 PM

## 2016-09-07 NOTE — ED Notes (Signed)
Opthamologist at bedside.   

## 2016-09-07 NOTE — ED Notes (Signed)
Teleneuro assessment complete. MD to call Dr Adriana Simas to discuss.

## 2016-09-07 NOTE — ED Notes (Signed)
Dr. Cook at bedside for evaluation.

## 2016-09-07 NOTE — ED Provider Notes (Signed)
[facility]-EMERGENCY DEPT Provider Note   CSN: [id number] Arrival date & time: 09/07/16  1043  By signing my name below, I, Bing Neighbors., attest that this documentation has been prepared under the direction and in the presence of Donnetta Hutching, MD. Electronically signed: Bing Neighbors., ED Scribe. 09/07/16. 2:51 PM.   History   Chief Complaint Chief Complaint  Patient presents with  . Dizziness   Level 5 caveat for urgent need for intervention  HPI [name] [name] is a 51 y.o. female who presents to the Emergency Department complaining of headache and dizziness with onset x2 hours. Pt states that she awoke this morning with a L parietal headache and associated L eye pain. She reportedly has blurred vision in the L eye and has occasional pain in the eye. Pt states that she was told by her roommate that she was stumbling this morning but was unaware of it. She reports L arm and L leg numbness, pain above the L eye. She denies any modifying factors.   HPI  Past Medical History:  Diagnosis Date  . Anxiety   . Bradycardia 03/02/2015  . Depression   . GERD (gastroesophageal reflux disease)   . History of hysterectomy   . History of oophorectomy   . Hx of cholecystectomy   . Near syncope 03/02/2015  . Sciatica neuralgia, right   . Tobacco abuse 03/03/2015  . Vertigo     Patient Active Problem List   Diagnosis Date Noted  . Snoring 04/19/2015  . Tobacco use disorder 04/19/2015  . Hypokalemia 03/03/2015  . Normocytic anemia 03/03/2015  . Tobacco abuse 03/03/2015  . Near syncope 03/02/2015  . Bradycardia 03/02/2015  . Pre-syncope 03/02/2015  . ANEMIA, IRON DEFICIENCY 08/01/2007  . RECTAL BLEEDING 08/01/2007  . DIARRHEA, CHRONIC 08/01/2007  . EPIGASTRIC PAIN 08/01/2007    Past Surgical History:  Procedure Laterality Date  . ABDOMINAL HYSTERECTOMY    . CESAREAN SECTION     x2  . CHOLECYSTECTOMY      OB History    No data available       Home  Medications    Prior to Admission medications   Medication Sig Start Date End Date Taking? Authorizing Provider  ALPRAZolam Prudy Feeler) 0.5 MG tablet Take 0.5 mg by mouth 3 (three) times daily.   Yes Historical Provider, MD  estrogen-methylTESTOSTERone (ESTRATEST) 1.25-2.5 MG per tablet Take 1 tablet by mouth daily. 04/08/14  Yes Historical Provider, MD  hydrochlorothiazide (HYDRODIURIL) 12.5 MG tablet Take 1 tablet (12.5 mg total) by mouth daily. Take as needed for swelling 03/03/15  Yes Elliot Cousin, MD  LORazepam (ATIVAN) 1 MG tablet Take 1 tablet (1 mg total) by mouth 3 (three) times daily as needed (dizziness / anxiety). 03/14/15  Yes Gerhard Munch, MD  meclizine (ANTIVERT) 25 MG tablet Take 25 mg by mouth 3 (three) times daily. 03/14/15  Yes Historical Provider, MD  naproxen (NAPROSYN) 500 MG tablet Take 1 tablet (500 mg total) by mouth 2 (two) times daily. 11/17/15  Yes Linwood Dibbles, MD  omeprazole (PRILOSEC OTC) 20 MG tablet Take 20 mg by mouth at bedtime.   Yes Historical Provider, MD  pantoprazole (PROTONIX) 40 MG tablet Take 40 mg by mouth daily. 03/23/15  Yes Historical Provider, MD  potassium chloride (K-DUR) 10 MEQ tablet Take 1 tablet (10 mEq total) by mouth daily as needed (Take with HCTZ.). 03/03/15  Yes Elliot Cousin, MD  ranitidine (ZANTAC) 150 MG tablet Take 150 mg by mouth daily.  Yes Historical Provider, MD  trimethoprim-polymyxin b (POLYTRIM) ophthalmic solution Place 1 drop into the right eye every 4 (four) hours. 11/17/15  Yes Linwood Dibbles, MD    Family History Family History  Problem Relation Age of Onset  . ALS Mother   . Cardiomyopathy Father     Social History Social History  Substance Use Topics  . Smoking status: Current Every Day Smoker    Packs/day: 0.10    Years: 20.00    Types: Cigarettes  . Smokeless tobacco: Never Used  . Alcohol use No     Allergies   Sulfa antibiotics   Review of Systems Review of Systems  Reason unable to perform ROS: Urgent  need for intervention.     Physical Exam Updated Vital Signs BP 134/82   Pulse 71   Temp 97.7 F (36.5 C) (Oral)   Resp 19   LMP 05/21/1997   SpO2 100%   Physical Exam  Constitutional: She is oriented to person, place, and time. She appears well-developed and well-nourished.  HENT:  Head: Normocephalic and atraumatic.  Decreased vision in L eye.  Eyes: Conjunctivae are normal.  Neck: Neck supple.  Cardiovascular: Normal rate and regular rhythm.   Pulmonary/Chest: Effort normal and breath sounds normal.  Abdominal: Soft. Bowel sounds are normal.  Musculoskeletal: Normal range of motion.  Neurological: She is alert and oriented to person, place, and time.  Questionable L arm and L leg weakness.   Skin: Skin is warm and dry.  Psychiatric: She has a normal mood and affect. Her behavior is normal.  Nursing note and vitals reviewed.    ED Treatments / Results   DIAGNOSTIC STUDIES: Oxygen Saturation is 100% normal by my interpretation.   COORDINATION OF CARE: 2:51 PM-Discussed next steps with pt. Pt verbalized understanding and is agreeable with the plan.    Labs (all labs ordered are listed, but only abnormal results are displayed) Labs Reviewed  COMPREHENSIVE METABOLIC PANEL - Abnormal; Notable for the following:       Result Value   Potassium 2.8 (*)    Chloride 98 (*)    Glucose, Bld 112 (*)    All other components within normal limits  I-STAT CHEM 8, ED - Abnormal; Notable for the following:    Potassium 2.8 (*)    Chloride 98 (*)    Glucose, Bld 108 (*)    All other components within normal limits  ETHANOL  PROTIME-INR  APTT  CBC  DIFFERENTIAL  RAPID URINE DRUG SCREEN, HOSP PERFORMED  URINALYSIS, ROUTINE W REFLEX MICROSCOPIC  I-STAT TROPOININ, ED    EKG  EKG Interpretation  Date/Time:  Friday September 07 2016 10:55:19 EDT Ventricular Rate:  65 PR Interval:    QRS Duration: 74 QT Interval:  372 QTC Calculation: 387 R Axis:   76 Text  Interpretation:  Sinus rhythm Confirmed by Adriana Simas  MD, Ashanna Heinsohn ([id number]) on 09/07/2016 11:19:12 AM       Radiology Mr Brain Wo Contrast (neuro Protocol)  Result Date: 09/07/2016 CLINICAL DATA:  Left parietal headache.  Rule out CVA EXAM: MRI HEAD WITHOUT CONTRAST TECHNIQUE: Multiplanar, multiecho pulse sequences of the brain and surrounding structures were obtained without intravenous contrast. COMPARISON:  CT head 09/07/2016 FINDINGS: Brain: Negative for acute or chronic infarction. Negative for hemorrhage, mass, or fluid collection. Ventricle size is normal. Vascular: Normal arterial flow voids. Skull and upper cervical spine: Negative Sinuses/Orbits: Negative Other: None IMPRESSION: Negative MRI of the brain. Electronically Signed   By: Marlan Palau  M.D.   On: 09/07/2016 12:24   Ct Head Code Stroke Wo Contrast`  Result Date: 09/07/2016 CLINICAL DATA:  Code stroke. Awoke at 5 a.m. with severe headache. Left eye vision changes. Left arm and leg numbness. EXAM: CT HEAD WITHOUT CONTRAST TECHNIQUE: Contiguous axial images were obtained from the base of the skull through the vertex without intravenous contrast. COMPARISON:  03/14/2015 FINDINGS: Brain: No evidence of acute infarction, hemorrhage, hydrocephalus, extra-axial collection or mass lesion/mass effect. Empty sella appearance, stable and incidental. Vascular: No hyperdense vessel or unexpected calcification. Skull: Normal. Negative for fracture or focal lesion. Sinuses/Orbits: No acute finding. Other: These results were called by telephone at the time of interpretation on 09/07/2016 at 11:20 am to Dr. Donnetta Hutching , who verbally acknowledged these results. ASPECTS [facility] Stroke Program Early CT Score) - Ganglionic level infarction (caudate, lentiform nuclei, internal capsule, insula, M1-M3 cortex): 7 - Supraganglionic infarction (M4-M6 cortex): 3 Total score (0-10 with 10 being normal): 10 IMPRESSION: Negative head CT. ASPECTS is 10. Electronically Signed    By: Marnee Spring M.D.   On: 09/07/2016 11:21    Procedures Procedures (including critical care time)  Medications Ordered in ED Medications  ondansetron (ZOFRAN) injection 4 mg (4 mg Intravenous Given 09/07/16 1151)  morphine 4 MG/ML injection 4 mg (4 mg Intravenous Given 09/07/16 1151)  potassium chloride SA (K-DUR,KLOR-CON) CR tablet 40 mEq (40 mEq Oral Given 09/07/16 1401)  acetaminophen (TYLENOL) tablet 1,000 mg (1,000 mg Oral Given 09/07/16 1416)     Initial Impression / Assessment and Plan / ED Course  I have reviewed the triage vital signs and the nursing notes.  There was a code stroke activate a code stroke.   Pertinent labs & imaging results that were available during my care of the patient were reviewed by me and considered in my medical decision making (see chart for details).    CRITICAL CARE Performed by: Donnetta Hutching Total critical care time: 30 minutes Critical care time was exclusive of separately billable procedures and treating other patients. Critical care was necessary to treat or prevent imminent or life-threatening deterioration. Critical care was time spent personally by me on the following activities: development of treatment plan with patient and/or surrogate as well as nursing, discussions with consultants, evaluation of patient's response to treatment, examination of patient, obtaining history from patient or surrogate, ordering and performing treatments and interventions, ordering and review of laboratory studies, ordering and review of radiographic studies, pulse oximetry and re-evaluation of patient's condition.  Patient presents with a left parietal headache and left-sided blurred vision with questionable left arm and leg numbness. Patient went to bed last night normal and woke up this morning with her symptoms. Code stroke initiated.  Tele neurologist uncertain of diagnosis. Will admit to hospital for further diagnostic evaluation.  Final Clinical  Impressions(s) / ED Diagnoses   Final diagnoses:  Acute intractable headache, unspecified headache type  Blurred vision    New Prescriptions New Prescriptions   No medications on file   I personally performed the services described in this documentation, which was scribed in my presence. The recorded information has been reviewed and is accurate.      Donnetta Hutching, MD 09/07/16 1455

## 2016-09-07 NOTE — ED Notes (Signed)
Report given to [facility]. ETA 20 minutes. Family updated.

## 2016-09-07 NOTE — Consult Note (Signed)
Reason for consult:  HPI: [name] [name] is an 51 y.o. female who we are asked to evaluate for decreased vision OS.   The patient presented to [facility] ED with complaints of dizziness, numbness, and decreased vision OS.  She had a full work up there and has been transferred to [facility] ED for further evaluation.   The patient provides the history.  She reports that her vision had been normal until this morning when it went "dark".  There was never total loss/blackout of vision/amaurosis, but she reports significant limitations in seeing from the left eye episodically over a period of minutes.  She then had relatively persistent blurring of her vision OS which continues currently.  She denies eye pain, diplopia or other visual symptoms.      Past Medical History:  Diagnosis Date  . Anxiety   . Bradycardia 03/02/2015  . Depression   . GERD (gastroesophageal reflux disease)   . History of hysterectomy   . History of oophorectomy   . Hx of cholecystectomy   . Near syncope 03/02/2015  . Sciatica neuralgia, right   . Tobacco abuse 03/03/2015  . Vertigo    Past Surgical History:  Procedure Laterality Date  . ABDOMINAL HYSTERECTOMY    . CESAREAN SECTION     x2  . CHOLECYSTECTOMY     Family History  Problem Relation Age of Onset  . ALS Mother   . Cardiomyopathy Father    Current Facility-Administered Medications  Medication Dose Route Frequency Provider Last Rate Last Dose  . ondansetron (ZOFRAN) tablet 4 mg  4 mg Oral Q6H PRN Kathie Dike, MD       Or  . ondansetron (ZOFRAN) injection 4 mg  4 mg Intravenous Q6H PRN Kathie Dike, MD   4 mg at 09/07/16 1641   Allergies  Allergen Reactions  . Sulfa Antibiotics Hives and Nausea And Vomiting   Social History   Social History  . Marital status: Single    Spouse name: N/A  . Number of children: N/A  . Years of education: N/A   Occupational History  . Not on file.   Social History Main Topics  . Smoking status:  Current Every Day Smoker    Packs/day: 0.10    Years: 20.00    Types: Cigarettes  . Smokeless tobacco: Never Used  . Alcohol use No  . Drug use: Yes    Frequency: 2.0 times per week     Comment: marijuana maybe once a week  . Sexual activity: Not on file   Other Topics Concern  . Not on file   Social History Narrative   Drinks 5-6 cups of coffee daily.    Review of systems: ROS  Physical Exam:  Blood pressure 106/67, pulse (!) 51, temperature 97.6 F (36.4 C), temperature source Oral, resp. rate 13, last menstrual period 05/21/1997, SpO2 96 %.   VA cc (_0 ):  OD 20/25-  OS  20/100-  Pupils:   OD round, reactive to light, no APD            OS round, reactive to light, no APD  IOP (T pen)  OD 11  OS  13  CVF: OD full to CF   OS full to CF  Motility:  OD full ductions  OS full ductions  Balance/alignment:  Ortho by [name] [name]   Slit lamp examination:  OD                                       External/adnexa: Normal                                      Lids/lashes:        Normal                                      Conjunctiva        White, quiet        Cornea:              Clear                  AC:                     Deep, quiet                                Iris:                     Normal        Lens:                  Clear                                       OS                                       External/adnexa: Normal                                      Lids/lashes:        Normal                                      Conjunctiva        White, quiet        Cornea:              Clear                  AC:                     Deep, quiet                                Iris:                     Normal        Lens:                  Clear       Dilated fundus exam: (Neo 2.5; Myd  1%)      OD Vitreous            Clear, quiet                                Optic Disc:       Normal, perfused                      Macula:              Flat                                            Vessels:           Normal caliber,distribution         Periphery:         Flat, attached                                      OS Vitreous            Clear, quiet                                Optic Disc:       Normal, perfused                      Macula:             Flat                                            Vessels:           Normal caliber,distribution         Periphery:         Flat, attached        Labs/studies: Results for orders placed or performed during the hospital encounter of 09/07/16 (from the past 48 hour(s))  Ethanol     Status: None   Collection Time: 09/07/16 10:45 AM  Result Value Ref Range   Alcohol, Ethyl (B) <5 <5 mg/dL    Comment:        LOWEST DETECTABLE LIMIT FOR SERUM ALCOHOL IS 5 mg/dL FOR MEDICAL PURPOSES ONLY   Protime-INR     Status: None   Collection Time: 09/07/16 10:45 AM  Result Value Ref Range   Prothrombin Time 13.1 11.4 - 15.2 seconds   INR 0.99   APTT     Status: None   Collection Time: 09/07/16 10:45 AM  Result Value Ref Range   aPTT 31 24 - 36 seconds  CBC     Status: None   Collection Time: 09/07/16 10:45 AM  Result Value Ref Range   WBC 6.8 4.0 - 10.5 K/uL   RBC 4.56 3.87 - 5.11 MIL/uL   Hemoglobin 14.8 12.0 - 15.0 g/dL   HCT 42.6 36.0 - 46.0 %   MCV 93.4 78.0 - 100.0 fL   MCH 32.5 26.0 - 34.0 pg   MCHC 34.7 30.0 - 36.0 g/dL   RDW 12.6 11.5 - 15.5 %  Platelets 220 150 - 400 K/uL  Differential     Status: None   Collection Time: 09/07/16 10:45 AM  Result Value Ref Range   Neutrophils Relative % 70 %   Neutro Abs 4.7 1.7 - 7.7 K/uL   Lymphocytes Relative 23 %   Lymphs Abs 1.6 0.7 - 4.0 K/uL   Monocytes Relative 6 %   Monocytes Absolute 0.4 0.1 - 1.0 K/uL   Eosinophils Relative 1 %   Eosinophils Absolute 0.1 0.0 - 0.7 K/uL   Basophils Relative 0 %   Basophils Absolute 0.0 0.0 - 0.1 K/uL  Comprehensive metabolic panel     Status: Abnormal   Collection Time:  09/07/16 10:45 AM  Result Value Ref Range   Sodium 135 135 - 145 mmol/L   Potassium 2.8 (L) 3.5 - 5.1 mmol/L   Chloride 98 (L) 101 - 111 mmol/L   CO2 28 22 - 32 mmol/L   Glucose, Bld 112 (H) 65 - 99 mg/dL   BUN 11 6 - 20 mg/dL   Creatinine, Ser 0.99 0.44 - 1.00 mg/dL   Calcium 9.6 8.9 - 10.3 mg/dL   Total Protein 7.8 6.5 - 8.1 g/dL   Albumin 4.2 3.5 - 5.0 g/dL   AST 24 15 - 41 U/L   ALT 20 14 - 54 U/L   Alkaline Phosphatase 69 38 - 126 U/L   Total Bilirubin 0.7 0.3 - 1.2 mg/dL   GFR calc non Af Amer >60 >60 mL/min   GFR calc Af Amer >60 >60 mL/min    Comment: (NOTE) The eGFR has been calculated using the CKD EPI equation. This calculation has not been validated in all clinical situations. eGFR's persistently <60 mL/min signify possible Chronic Kidney Disease.    Anion gap 9 5 - 15  Sedimentation rate     Status: None   Collection Time: 09/07/16 10:45 AM  Result Value Ref Range   Sed Rate 7 0 - 22 mm/hr  I-Stat Chem 8, ED  (not at [facility])     Status: Abnormal   Collection Time: 09/07/16 11:10 AM  Result Value Ref Range   Sodium 140 135 - 145 mmol/L   Potassium 2.8 (L) 3.5 - 5.1 mmol/L   Chloride 98 (L) 101 - 111 mmol/L   BUN 11 6 - 20 mg/dL   Creatinine, Ser 1.00 0.44 - 1.00 mg/dL   Glucose, Bld 108 (H) 65 - 99 mg/dL   Calcium, Ion 1.17 1.15 - 1.40 mmol/L   TCO2 29 0 - 100 mmol/L   Hemoglobin 15.0 12.0 - 15.0 g/dL   HCT 44.0 36.0 - 46.0 %  Urine rapid drug screen (hosp performed)not at [facility]     Status: Abnormal   Collection Time: 09/07/16  1:40 PM  Result Value Ref Range   Opiates POSITIVE (A) NONE DETECTED   Cocaine NONE DETECTED NONE DETECTED   Benzodiazepines POSITIVE (A) NONE DETECTED   Amphetamines POSITIVE (A) NONE DETECTED   Tetrahydrocannabinol POSITIVE (A) NONE DETECTED   Barbiturates NONE DETECTED NONE DETECTED    Comment:        DRUG SCREEN FOR MEDICAL PURPOSES ONLY.  IF CONFIRMATION IS NEEDED FOR ANY PURPOSE, NOTIFY LAB WITHIN 5 DAYS.         LOWEST DETECTABLE LIMITS FOR URINE DRUG SCREEN Drug Class       Cutoff (ng/mL) Amphetamine      1000 Barbiturate      200 Benzodiazepine   681 Tricyclics  300 Opiates          300 Cocaine          300 THC              50   Urinalysis, Routine w reflex microscopic     Status: Abnormal   Collection Time: 09/07/16  1:40 PM  Result Value Ref Range   Color, Urine YELLOW YELLOW   APPearance HAZY (A) CLEAR   Specific Gravity, Urine 1.016 1.005 - 1.030   pH 7.0 5.0 - 8.0   Glucose, UA NEGATIVE NEGATIVE mg/dL   Hgb urine dipstick NEGATIVE NEGATIVE   Bilirubin Urine NEGATIVE NEGATIVE   Ketones, ur NEGATIVE NEGATIVE mg/dL   Protein, ur NEGATIVE NEGATIVE mg/dL   Nitrite NEGATIVE NEGATIVE   Leukocytes, UA NEGATIVE NEGATIVE   Mr Brain Wo Contrast (neuro Protocol)  Result Date: 09/07/2016 CLINICAL DATA:  Left parietal headache.  Rule out CVA EXAM: MRI HEAD WITHOUT CONTRAST TECHNIQUE: Multiplanar, multiecho pulse sequences of the brain and surrounding structures were obtained without intravenous contrast. COMPARISON:  CT head 09/07/2016 FINDINGS: Brain: Negative for acute or chronic infarction. Negative for hemorrhage, mass, or fluid collection. Ventricle size is normal. Vascular: Normal arterial flow voids. Skull and upper cervical spine: Negative Sinuses/Orbits: Negative Other: None IMPRESSION: Negative MRI of the brain. Electronically Signed   By: Franchot Gallo M.D.   On: 09/07/2016 12:24   Ct Head Code Stroke Wo Contrast`  Result Date: 09/07/2016 CLINICAL DATA:  Code stroke. Awoke at 5 a.m. with severe headache. Left eye vision changes. Left arm and leg numbness. EXAM: CT HEAD WITHOUT CONTRAST TECHNIQUE: Contiguous axial images were obtained from the base of the skull through the vertex without intravenous contrast. COMPARISON:  03/14/2015 FINDINGS: Brain: No evidence of acute infarction, hemorrhage, hydrocephalus, extra-axial collection or mass lesion/mass effect. Empty sella appearance,  stable and incidental. Vascular: No hyperdense vessel or unexpected calcification. Skull: Normal. Negative for fracture or focal lesion. Sinuses/Orbits: No acute finding. Other: These results were called by telephone at the time of interpretation on 09/07/2016 at 11:20 am to Dr. Nat Christen , who verbally acknowledged these results. ASPECTS [facility] Stroke Program Early CT Score) - Ganglionic level infarction (caudate, lentiform nuclei, internal capsule, insula, M1-M3 cortex): 7 - Supraganglionic infarction (M4-M6 cortex): 3 Total score (0-10 with 10 being normal): 10 IMPRESSION: Negative head CT. ASPECTS is 10. Electronically Signed   By: Monte Fantasia M.D.   On: 09/07/2016 11:21                             Assessment and Plan:  MACIEL KEGG is an 51 y.o. female who we are asked to evaluate for decreased vision OS with:   -- No acute findings on bedside eye examination. -- No acute findings on head CT, brain MRI -- Normal ESR.   Recommendation:   -- Out patient follow up for further evaluation and testing including but not limited to Humphrey visual field.  All of the above information was relayed to the patient her family.  Ophthalmic warning signs and symptoms were reviewed, and clear instructions for immediate phone contact and/or immediate return to the ED or clinic were provided should any of these signs or symptoms occur.  Follow up contact information was provided.  All questions were answered.   [name] [name] 09/07/2016, 8:34 PM  [facility] [phone]

## 2016-09-07 NOTE — ED Notes (Signed)
Pt given meal tray.

## 2016-09-07 NOTE — ED Notes (Signed)
Patient to MRI.

## 2016-09-07 NOTE — ED Notes (Signed)
Opthalmology at bedside.

## 2016-09-07 NOTE — ED Notes (Signed)
Dr Adriana Simas activated code stroke

## 2016-09-07 NOTE — Progress Notes (Signed)
CODE STROKE 10:58 call time, beeper time 1100 exam started 1105 exam ended,images sent to [facility] 1113 exam ended in [facility] Radiology called, spoke with Misty Stanley

## 2016-09-07 NOTE — ED Provider Notes (Signed)
[facility]-EMERGENCY DEPT Provider Note   CSN: [id number] Arrival date & time: 09/07/16  1043   An emergency department physician performed an initial assessment on this suspected stroke patient at 1055.  History   Chief Complaint Chief Complaint  Patient presents with  . Dizziness    HPI [name] [name] is a 51 y.o. female.  The history is provided by the patient, the EMS personnel and medical records.  Neurologic Problem  This is a new problem. Episode onset: This morning. The problem occurs constantly. The problem has not changed since onset.Associated symptoms include headaches. Pertinent negatives include no chest pain, no abdominal pain and no shortness of breath. Nothing aggravates the symptoms. Nothing relieves the symptoms. She has tried nothing for the symptoms. The treatment provided no relief.    Past Medical History:  Diagnosis Date  . Anxiety   . Bradycardia 03/02/2015  . Depression   . GERD (gastroesophageal reflux disease)   . History of hysterectomy   . History of oophorectomy   . Hx of cholecystectomy   . Near syncope 03/02/2015  . Sciatica neuralgia, right   . Tobacco abuse 03/03/2015  . Vertigo     Patient Active Problem List   Diagnosis Date Noted  . Headache 09/07/2016  . Vision, loss, sudden, left 09/07/2016  . Left sided numbness 09/07/2016  . GERD (gastroesophageal reflux disease) 09/07/2016  . Vision loss, left eye 09/07/2016  . Snoring 04/19/2015  . Tobacco use disorder 04/19/2015  . Hypokalemia 03/03/2015  . Normocytic anemia 03/03/2015  . Tobacco abuse 03/03/2015  . Near syncope 03/02/2015  . Bradycardia 03/02/2015  . Pre-syncope 03/02/2015  . ANEMIA, IRON DEFICIENCY 08/01/2007  . RECTAL BLEEDING 08/01/2007  . DIARRHEA, CHRONIC 08/01/2007  . EPIGASTRIC PAIN 08/01/2007    Past Surgical History:  Procedure Laterality Date  . ABDOMINAL HYSTERECTOMY    . CESAREAN SECTION     x2  . CHOLECYSTECTOMY      OB History    No data  available       Home Medications    Prior to Admission medications   Medication Sig Start Date End Date Taking? Authorizing Provider  ALPRAZolam Prudy Feeler) 0.5 MG tablet Take 0.5 mg by mouth 3 (three) times daily.   Yes Historical Provider, MD  estrogen-methylTESTOSTERone (ESTRATEST) 1.25-2.5 MG per tablet Take 1 tablet by mouth daily. 04/08/14  Yes Historical Provider, MD  hydrochlorothiazide (HYDRODIURIL) 12.5 MG tablet Take 1 tablet (12.5 mg total) by mouth daily. Take as needed for swelling 03/03/15  Yes Elliot Cousin, MD  LORazepam (ATIVAN) 1 MG tablet Take 1 tablet (1 mg total) by mouth 3 (three) times daily as needed (dizziness / anxiety). 03/14/15  Yes Gerhard Munch, MD  meclizine (ANTIVERT) 25 MG tablet Take 25 mg by mouth 3 (three) times daily. 03/14/15  Yes Historical Provider, MD  naproxen (NAPROSYN) 500 MG tablet Take 1 tablet (500 mg total) by mouth 2 (two) times daily. 11/17/15  Yes Linwood Dibbles, MD  omeprazole (PRILOSEC OTC) 20 MG tablet Take 20 mg by mouth at bedtime.   Yes Historical Provider, MD  pantoprazole (PROTONIX) 40 MG tablet Take 40 mg by mouth daily. 03/23/15  Yes Historical Provider, MD  potassium chloride (K-DUR) 10 MEQ tablet Take 1 tablet (10 mEq total) by mouth daily as needed (Take with HCTZ.). 03/03/15  Yes Elliot Cousin, MD  ranitidine (ZANTAC) 150 MG tablet Take 150 mg by mouth daily.   Yes Historical Provider, MD  trimethoprim-polymyxin b (POLYTRIM) ophthalmic solution Place 1  drop into the right eye every 4 (four) hours. 11/17/15  Yes Linwood Dibbles, MD    Family History Family History  Problem Relation Age of Onset  . ALS Mother   . Cardiomyopathy Father     Social History Social History  Substance Use Topics  . Smoking status: Current Every Day Smoker    Packs/day: 0.10    Years: 20.00    Types: Cigarettes  . Smokeless tobacco: Never Used  . Alcohol use No     Allergies   Sulfa antibiotics   Review of Systems Review of Systems    Constitutional: Negative for chills and fever.  HENT: Negative for ear pain and sore throat.   Eyes: Positive for visual disturbance. Negative for pain.  Respiratory: Negative for cough and shortness of breath.   Cardiovascular: Negative for chest pain and palpitations.  Gastrointestinal: Negative for abdominal pain and vomiting.  Genitourinary: Negative for dysuria and hematuria.  Musculoskeletal: Positive for gait problem. Negative for arthralgias and back pain.  Skin: Negative for color change and rash.  Neurological: Positive for weakness and headaches. Negative for seizures and syncope.  All other systems reviewed and are negative.    Physical Exam Updated Vital Signs BP 109/87   Pulse 65   Temp 97.7 F (36.5 C) (Oral)   Resp 15   LMP 05/21/1997   SpO2 100%   Physical Exam  Constitutional: She is oriented to person, place, and time. She appears well-developed and well-nourished. No distress.  HENT:  Head: Normocephalic and atraumatic.  Eyes: Conjunctivae and EOM are normal. Pupils are equal, round, and reactive to light.  No obvious abnormalities in the tissues surrounding the eye, no anterior chamber abnormalities; right VA 20/20, left VA 20/200 (while wearing glasses)  Neck: Neck supple.  Cardiovascular: Regular rhythm and intact distal pulses.   No murmur heard. Pulmonary/Chest: Effort normal and breath sounds normal. No respiratory distress.  Abdominal: Soft. There is no tenderness.  Musculoskeletal: She exhibits no edema.  Neurological: She is alert and oriented to person, place, and time.  CN 3-12 grossly intact, 5/5 strength in RUE & RLE, 4/5 strength in LUE & LLE, sensation intact to light touch throughout, gait testing deferred  Skin: Skin is warm and dry.  Psychiatric: She has a normal mood and affect.  Nursing note and vitals reviewed.   ED Treatments / Results  Labs (all labs ordered are listed, but only abnormal results are displayed) Labs Reviewed   COMPREHENSIVE METABOLIC PANEL - Abnormal; Notable for the following:       Result Value   Potassium 2.8 (*)    Chloride 98 (*)    Glucose, Bld 112 (*)    All other components within normal limits  RAPID URINE DRUG SCREEN, HOSP PERFORMED - Abnormal; Notable for the following:    Opiates POSITIVE (*)    Benzodiazepines POSITIVE (*)    Amphetamines POSITIVE (*)    Tetrahydrocannabinol POSITIVE (*)    All other components within normal limits  URINALYSIS, ROUTINE W REFLEX MICROSCOPIC - Abnormal; Notable for the following:    APPearance HAZY (*)    All other components within normal limits  I-STAT CHEM 8, ED - Abnormal; Notable for the following:    Potassium 2.8 (*)    Chloride 98 (*)    Glucose, Bld 108 (*)    All other components within normal limits  ETHANOL  PROTIME-INR  APTT  CBC  DIFFERENTIAL  SEDIMENTATION RATE  C-REACTIVE PROTEIN  I-STAT TROPOININ, ED  EKG  EKG Interpretation  Date/Time:  Friday September 07 2016 10:55:19 EDT Ventricular Rate:  65 PR Interval:    QRS Duration: 74 QT Interval:  372 QTC Calculation: 387 R Axis:   76 Text Interpretation:  Sinus rhythm Confirmed by Adriana Simas  MD, BRIAN ([id number]) on 09/07/2016 11:19:12 AM       Radiology Mr Brain Wo Contrast (neuro Protocol)  Result Date: 09/07/2016 CLINICAL DATA:  Left parietal headache.  Rule out CVA EXAM: MRI HEAD WITHOUT CONTRAST TECHNIQUE: Multiplanar, multiecho pulse sequences of the brain and surrounding structures were obtained without intravenous contrast. COMPARISON:  CT head 09/07/2016 FINDINGS: Brain: Negative for acute or chronic infarction. Negative for hemorrhage, mass, or fluid collection. Ventricle size is normal. Vascular: Normal arterial flow voids. Skull and upper cervical spine: Negative Sinuses/Orbits: Negative Other: None IMPRESSION: Negative MRI of the brain. Electronically Signed   By: Marlan Palau M.D.   On: 09/07/2016 12:24   Ct Head Code Stroke Wo Contrast`  Result Date:  09/07/2016 CLINICAL DATA:  Code stroke. Awoke at 5 a.m. with severe headache. Left eye vision changes. Left arm and leg numbness. EXAM: CT HEAD WITHOUT CONTRAST TECHNIQUE: Contiguous axial images were obtained from the base of the skull through the vertex without intravenous contrast. COMPARISON:  03/14/2015 FINDINGS: Brain: No evidence of acute infarction, hemorrhage, hydrocephalus, extra-axial collection or mass lesion/mass effect. Empty sella appearance, stable and incidental. Vascular: No hyperdense vessel or unexpected calcification. Skull: Normal. Negative for fracture or focal lesion. Sinuses/Orbits: No acute finding. Other: These results were called by telephone at the time of interpretation on 09/07/2016 at 11:20 am to Dr. Donnetta Hutching , who verbally acknowledged these results. ASPECTS [facility] Stroke Program Early CT Score) - Ganglionic level infarction (caudate, lentiform nuclei, internal capsule, insula, M1-M3 cortex): 7 - Supraganglionic infarction (M4-M6 cortex): 3 Total score (0-10 with 10 being normal): 10 IMPRESSION: Negative head CT. ASPECTS is 10. Electronically Signed   By: Marnee Spring M.D.   On: 09/07/2016 11:21    Procedures Procedures (including critical care time)  Medications Ordered in ED Medications  ondansetron (ZOFRAN) tablet 4 mg ( Oral See Alternative 09/07/16 1641)    Or  ondansetron (ZOFRAN) injection 4 mg (4 mg Intravenous Given 09/07/16 1641)  ondansetron (ZOFRAN) injection 4 mg (4 mg Intravenous Given 09/07/16 1151)  morphine 4 MG/ML injection 4 mg (4 mg Intravenous Given 09/07/16 1151)  potassium chloride SA (K-DUR,KLOR-CON) CR tablet 40 mEq (40 mEq Oral Given 09/07/16 1401)  acetaminophen (TYLENOL) tablet 1,000 mg (1,000 mg Oral Given 09/07/16 1416)     Initial Impression / Assessment and Plan / ED Course  I have reviewed the triage vital signs and the nursing notes.  Pertinent labs & imaging results that were available during my care of the patient were reviewed  by me and considered in my medical decision making (see chart for details).    Pt presents as a transfer from [facility] for opthalmology evaluation. Evaluated earlier today for stroke-like symptoms; telestroke MD consulted from [facility] & recommended direct admission here after Ophthalmology consult in the ED.  VS & exam as above.   Ophthalmology consulted and evaluated the Pt in the ED; no acute findings on his exam.  Will admit the Pt to the Hospitalist's service for further evaluation and treatment.  Final Clinical Impressions(s) / ED Diagnoses   Final diagnoses:  Acute intractable headache, unspecified headache type  Blurred vision    New Prescriptions New Prescriptions   No medications on file  Forest Becker, MD 09/07/16 2357    Blane Ohara, MD 09/09/16 [phone]

## 2016-09-07 NOTE — ED Notes (Signed)
Pt continues to complain of nausea. Medicated per orders. [facility] at bedside.

## 2016-09-07 NOTE — ED Triage Notes (Signed)
Pt c/o left side headache and states she has been stumbling around this am; pt states she woke up this am at 0445 and went to bed around midnight

## 2016-09-07 NOTE — Progress Notes (Signed)
Patient admitted from E.D Patient alert and oriented X4 walked from stretcher to bed. Welcomed to unit  Oriented to room. Tele initiated and verified.

## 2016-09-07 NOTE — ED Notes (Signed)
Called [facility] ED Charge RN. Aware of pt.

## 2016-09-08 ENCOUNTER — Encounter (HOSPITAL_COMMUNITY): Payer: Self-pay | Admitting: Radiology

## 2016-09-08 ENCOUNTER — Observation Stay (HOSPITAL_COMMUNITY): Payer: Commercial Managed Care - HMO

## 2016-09-08 DIAGNOSIS — K219 Gastro-esophageal reflux disease without esophagitis: Secondary | ICD-10-CM

## 2016-09-08 DIAGNOSIS — R51 Headache: Secondary | ICD-10-CM

## 2016-09-08 DIAGNOSIS — R200 Anesthesia of skin: Secondary | ICD-10-CM | POA: Diagnosis not present

## 2016-09-08 DIAGNOSIS — H538 Other visual disturbances: Secondary | ICD-10-CM | POA: Diagnosis not present

## 2016-09-08 LAB — CBC
HEMATOCRIT: 37.9 % (ref 36.0–46.0)
HEMOGLOBIN: 12.5 g/dL (ref 12.0–15.0)
MCH: 31 pg (ref 26.0–34.0)
MCHC: 33 g/dL (ref 30.0–36.0)
MCV: 94 fL (ref 78.0–100.0)
PLATELETS: 203 10*3/uL (ref 150–400)
RBC: 4.03 MIL/uL (ref 3.87–5.11)
RDW: 12.4 % (ref 11.5–15.5)
WBC: 5.8 10*3/uL (ref 4.0–10.5)

## 2016-09-08 LAB — BASIC METABOLIC PANEL
Anion gap: 5 (ref 5–15)
BUN: 12 mg/dL (ref 6–20)
CO2: 28 mmol/L (ref 22–32)
Calcium: 8.9 mg/dL (ref 8.9–10.3)
Chloride: 105 mmol/L (ref 101–111)
Creatinine, Ser: 1.16 mg/dL — ABNORMAL HIGH (ref 0.44–1.00)
GFR calc Af Amer: >60 mL/min (ref >60)
GFR calc non Af Amer: 54 mL/min — ABNORMAL LOW (ref >60)
Glucose, Bld: 103 mg/dL — ABNORMAL HIGH (ref 65–99)
Potassium: 4.1 mmol/L (ref 3.5–5.1)
Sodium: 138 mmol/L (ref 135–145)

## 2016-09-08 LAB — HIV ANTIBODY (ROUTINE TESTING W REFLEX): HIV Screen 4th Generation wRfx: NONREACTIVE

## 2016-09-08 MED ORDER — HYDROCHLOROTHIAZIDE 12.5 MG PO CAPS
12.5000 mg | ORAL_CAPSULE | Freq: Every day | ORAL | Status: DC
  Administered 2016-09-08: 12.5 mg via ORAL
  Filled 2016-09-08: qty 1

## 2016-09-08 MED ORDER — IOPAMIDOL (ISOVUE-370) INJECTION 76%
INTRAVENOUS | Status: AC
  Administered 2016-09-08: 50 mL
  Filled 2016-09-08: qty 50

## 2016-09-08 NOTE — Consult Note (Signed)
Admission H&P    Chief Complaint: Headache and visual changes.  HPI: [name] [name] is an 51 y.o. female with a history of vertigo, tobacco abuse, GERD, depression and anxiety, transferred from [facility] for further evaluation of headache and visual changes. She experienced acute onset headache as well as loss of vision involving her left eye on the morning of 09/07/2016. She has no history of migraine headaches. CT scan of the head was unremarkable. MRI showed no acute intracranial abnormality. She was evaluated by ophthalmology in the ED at [facility]. Visual acuity was 20/100. Exam was unremarkable. Sedimentation rate was 7. There was associated nausea with the headache. Headache has resolved at this point. Left eye vision has improved but is not back to baseline, subjectively.  Past Medical History:  Diagnosis Date  . Anxiety   . Bradycardia 03/02/2015  . Depression   . GERD (gastroesophageal reflux disease)   . History of hysterectomy   . History of oophorectomy   . Hx of cholecystectomy   . Near syncope 03/02/2015  . Sciatica neuralgia, right   . Tobacco abuse 03/03/2015  . Vertigo     Past Surgical History:  Procedure Laterality Date  . ABDOMINAL HYSTERECTOMY    . CESAREAN SECTION     x2  . CHOLECYSTECTOMY      Family History  Problem Relation Age of Onset  . ALS Mother   . Cardiomyopathy Father    Social History:  reports that she has been smoking Cigarettes.  She has a 2.00 pack-year smoking history. She has never used smokeless tobacco. She reports that she uses drugs about 2 times per week. She reports that she does not drink alcohol.  Allergies:  Allergies  Allergen Reactions  . Sulfa Antibiotics Hives and Nausea And Vomiting    Medications Prior to Admission  Medication Sig Dispense Refill  . ALPRAZolam (XANAX) 0.5 MG tablet Take 0.5 mg by mouth 3 (three) times daily.    Marland Kitchen estrogen-methylTESTOSTERone (ESTRATEST) 1.25-2.5 MG per tablet Take 1 tablet by  mouth daily.  5  . hydrochlorothiazide (HYDRODIURIL) 12.5 MG tablet Take 1 tablet (12.5 mg total) by mouth daily. Take as needed for swelling    . LORazepam (ATIVAN) 1 MG tablet Take 1 tablet (1 mg total) by mouth 3 (three) times daily as needed (dizziness / anxiety). 15 tablet 0  . meclizine (ANTIVERT) 25 MG tablet Take 25 mg by mouth 3 (three) times daily.  0  . naproxen (NAPROSYN) 500 MG tablet Take 1 tablet (500 mg total) by mouth 2 (two) times daily. 30 tablet 0  . omeprazole (PRILOSEC OTC) 20 MG tablet Take 20 mg by mouth at bedtime.    . pantoprazole (PROTONIX) 40 MG tablet Take 40 mg by mouth daily.  2  . potassium chloride (K-DUR) 10 MEQ tablet Take 1 tablet (10 mEq total) by mouth daily as needed (Take with HCTZ.). 30 tablet 2  . ranitidine (ZANTAC) 150 MG tablet Take 150 mg by mouth daily.    Marland Kitchen trimethoprim-polymyxin b (POLYTRIM) ophthalmic solution Place 1 drop into the right eye every 4 (four) hours. 10 mL 0    ROS: History obtained from the patient  General ROS: negative for - chills, fatigue, fever, night sweats, weight gain or weight loss Psychological ROS: negative for - behavioral disorder, hallucinations, memory difficulties, mood swings or suicidal ideation Ophthalmic ROS: negative for - blurry vision, double vision, eye pain or loss of vision ENT ROS: negative for - epistaxis, nasal discharge,  oral lesions, sore throat, tinnitus or vertigo Allergy and Immunology ROS: negative for - hives or itchy/watery eyes Hematological and Lymphatic ROS: negative for - bleeding problems, bruising or swollen lymph nodes Endocrine ROS: negative for - galactorrhea, hair pattern changes, polydipsia/polyuria or temperature intolerance Respiratory ROS: negative for - cough, hemoptysis, shortness of breath or wheezing Cardiovascular ROS: negative for - chest pain, dyspnea on exertion, edema or irregular heartbeat Gastrointestinal ROS: negative for - abdominal pain, diarrhea, hematemesis,  nausea/vomiting or stool incontinence Genito-Urinary ROS: negative for - dysuria, hematuria, incontinence or urinary frequency/urgency Musculoskeletal ROS: negative for - joint swelling or muscular weakness Neurological ROS: as noted in HPI Dermatological ROS: negative for rash and skin lesion changes  Physical Examination: Blood pressure 122/60, pulse (!) 57, temperature 98 F (36.7 C), temperature source Oral, resp. rate 18, height 5' 2"  (1.575 m), weight 63.9 kg (140 lb 14.4 oz), last menstrual period 05/21/1997, SpO2 98 %.  HEENT-  Normocephalic, no lesions, without obvious abnormality.  Normal external eye and conjunctiva.  Normal TM's bilaterally.  Normal auditory canals and external ears. Normal external nose, mucus membranes and septum.  Normal pharynx. Neck supple with no masses, nodes, nodules or enlargement. Cardiovascular - regular rate and rhythm, S1, S2 normal, no murmur, click, rub or gallop Lungs - chest clear, no wheezing, rales, normal symmetric air entry Abdomen - soft, non-tender; bowel sounds normal; no masses,  no organomegaly Extremities - no joint deformities, effusion, or inflammation and no edema  Neurologic Examination: Mental Status: Alert, oriented, no acute distress.  Speech fluent without evidence of aphasia. Able to follow commands without difficulty. Cranial Nerves: II-Visual fields were normal. III/IV/VI-Pupils were equal and reacted normally to light. Extraocular movements were full and conjugate.    V/VII-no facial numbness and no facial weakness. VIII-normal. X-normal speech and symmetrical palatal movement. Motor: 5/5 bilaterally with normal tone and bulk Sensory: Normal throughout. Deep Tendon Reflexes: 1+ and symmetric. Plantars: Flexor bilaterally Cerebellar: Normal finger-to-nose testing. Carotid auscultation: Normal  Results for orders placed or performed during the hospital encounter of 09/07/16 (from the past 48 hour(s))  Ethanol      Status: None   Collection Time: 09/07/16 10:45 AM  Result Value Ref Range   Alcohol, Ethyl (B) <5 <5 mg/dL    Comment:        LOWEST DETECTABLE LIMIT FOR SERUM ALCOHOL IS 5 mg/dL FOR MEDICAL PURPOSES ONLY   Protime-INR     Status: None   Collection Time: 09/07/16 10:45 AM  Result Value Ref Range   Prothrombin Time 13.1 11.4 - 15.2 seconds   INR 0.99   APTT     Status: None   Collection Time: 09/07/16 10:45 AM  Result Value Ref Range   aPTT 31 24 - 36 seconds  CBC     Status: None   Collection Time: 09/07/16 10:45 AM  Result Value Ref Range   WBC 6.8 4.0 - 10.5 K/uL   RBC 4.56 3.87 - 5.11 MIL/uL   Hemoglobin 14.8 12.0 - 15.0 g/dL   HCT 42.6 36.0 - 46.0 %   MCV 93.4 78.0 - 100.0 fL   MCH 32.5 26.0 - 34.0 pg   MCHC 34.7 30.0 - 36.0 g/dL   RDW 12.6 11.5 - 15.5 %   Platelets 220 150 - 400 K/uL  Differential     Status: None   Collection Time: 09/07/16 10:45 AM  Result Value Ref Range   Neutrophils Relative % 70 %   Neutro Abs 4.7 1.7 -  7.7 K/uL   Lymphocytes Relative 23 %   Lymphs Abs 1.6 0.7 - 4.0 K/uL   Monocytes Relative 6 %   Monocytes Absolute 0.4 0.1 - 1.0 K/uL   Eosinophils Relative 1 %   Eosinophils Absolute 0.1 0.0 - 0.7 K/uL   Basophils Relative 0 %   Basophils Absolute 0.0 0.0 - 0.1 K/uL  Comprehensive metabolic panel     Status: Abnormal   Collection Time: 09/07/16 10:45 AM  Result Value Ref Range   Sodium 135 135 - 145 mmol/L   Potassium 2.8 (L) 3.5 - 5.1 mmol/L   Chloride 98 (L) 101 - 111 mmol/L   CO2 28 22 - 32 mmol/L   Glucose, Bld 112 (H) 65 - 99 mg/dL   BUN 11 6 - 20 mg/dL   Creatinine, Ser 0.99 0.44 - 1.00 mg/dL   Calcium 9.6 8.9 - 10.3 mg/dL   Total Protein 7.8 6.5 - 8.1 g/dL   Albumin 4.2 3.5 - 5.0 g/dL   AST 24 15 - 41 U/L   ALT 20 14 - 54 U/L   Alkaline Phosphatase 69 38 - 126 U/L   Total Bilirubin 0.7 0.3 - 1.2 mg/dL   GFR calc non Af Amer >60 >60 mL/min   GFR calc Af Amer >60 >60 mL/min    Comment: (NOTE) The eGFR has been calculated  using the CKD EPI equation. This calculation has not been validated in all clinical situations. eGFR's persistently <60 mL/min signify possible Chronic Kidney Disease.    Anion gap 9 5 - 15  Sedimentation rate     Status: None   Collection Time: 09/07/16 10:45 AM  Result Value Ref Range   Sed Rate 7 0 - 22 mm/hr  C-reactive protein     Status: None   Collection Time: 09/07/16 10:45 AM  Result Value Ref Range   CRP <0.8 <1.0 mg/dL    Comment: Performed at [facility], [place] [address], [place], [place] [address]  I-Stat Chem 8, ED  (not at [facility], [facility])     Status: Abnormal   Collection Time: 09/07/16 11:10 AM  Result Value Ref Range   Sodium 140 135 - 145 mmol/L   Potassium 2.8 (L) 3.5 - 5.1 mmol/L   Chloride 98 (L) 101 - 111 mmol/L   BUN 11 6 - 20 mg/dL   Creatinine, Ser 1.00 0.44 - 1.00 mg/dL   Glucose, Bld 108 (H) 65 - 99 mg/dL   Calcium, Ion 1.17 1.15 - 1.40 mmol/L   TCO2 29 0 - 100 mmol/L   Hemoglobin 15.0 12.0 - 15.0 g/dL   HCT 44.0 36.0 - 46.0 %  Urine rapid drug screen (hosp performed)not at [facility]     Status: Abnormal   Collection Time: 09/07/16  1:40 PM  Result Value Ref Range   Opiates POSITIVE (A) NONE DETECTED   Cocaine NONE DETECTED NONE DETECTED   Benzodiazepines POSITIVE (A) NONE DETECTED   Amphetamines POSITIVE (A) NONE DETECTED   Tetrahydrocannabinol POSITIVE (A) NONE DETECTED   Barbiturates NONE DETECTED NONE DETECTED    Comment:        DRUG SCREEN FOR MEDICAL PURPOSES ONLY.  IF CONFIRMATION IS NEEDED FOR ANY PURPOSE, NOTIFY LAB WITHIN 5 DAYS.        LOWEST DETECTABLE LIMITS FOR URINE DRUG SCREEN Drug Class       Cutoff (ng/mL) Amphetamine      1000 Barbiturate      200 Benzodiazepine   982 Tricyclics  300 Opiates          300 Cocaine          300 THC              50   Urinalysis, Routine w reflex microscopic     Status: Abnormal   Collection Time: 09/07/16  1:40 PM  Result Value Ref Range   Color, Urine YELLOW YELLOW   APPearance HAZY  (A) CLEAR   Specific Gravity, Urine 1.016 1.005 - 1.030   pH 7.0 5.0 - 8.0   Glucose, UA NEGATIVE NEGATIVE mg/dL   Hgb urine dipstick NEGATIVE NEGATIVE   Bilirubin Urine NEGATIVE NEGATIVE   Ketones, ur NEGATIVE NEGATIVE mg/dL   Protein, ur NEGATIVE NEGATIVE mg/dL   Nitrite NEGATIVE NEGATIVE   Leukocytes, UA NEGATIVE NEGATIVE  Magnesium     Status: None   Collection Time: 09/07/16  9:11 PM  Result Value Ref Range   Magnesium 1.9 1.7 - 2.4 mg/dL  Vitamin B12     Status: None   Collection Time: 09/07/16  9:11 PM  Result Value Ref Range   Vitamin B-12 323 180 - 914 pg/mL    Comment: (NOTE) This assay is not validated for testing neonatal or myeloproliferative syndrome specimens for Vitamin B12 levels.   TSH     Status: None   Collection Time: 09/07/16  9:11 PM  Result Value Ref Range   TSH 1.233 0.350 - 4.500 uIU/mL    Comment: Performed by a 3rd Generation assay with a functional sensitivity of <=0.01 uIU/mL.   Mr Brain Wo Contrast (neuro Protocol)  Result Date: 09/07/2016 CLINICAL DATA:  Left parietal headache.  Rule out CVA EXAM: MRI HEAD WITHOUT CONTRAST TECHNIQUE: Multiplanar, multiecho pulse sequences of the brain and surrounding structures were obtained without intravenous contrast. COMPARISON:  CT head 09/07/2016 FINDINGS: Brain: Negative for acute or chronic infarction. Negative for hemorrhage, mass, or fluid collection. Ventricle size is normal. Vascular: Normal arterial flow voids. Skull and upper cervical spine: Negative Sinuses/Orbits: Negative Other: None IMPRESSION: Negative MRI of the brain. Electronically Signed   By: Franchot Gallo M.D.   On: 09/07/2016 12:24   Ct Head Code Stroke Wo Contrast`  Result Date: 09/07/2016 CLINICAL DATA:  Code stroke. Awoke at 5 a.m. with severe headache. Left eye vision changes. Left arm and leg numbness. EXAM: CT HEAD WITHOUT CONTRAST TECHNIQUE: Contiguous axial images were obtained from the base of the skull through the vertex without  intravenous contrast. COMPARISON:  03/14/2015 FINDINGS: Brain: No evidence of acute infarction, hemorrhage, hydrocephalus, extra-axial collection or mass lesion/mass effect. Empty sella appearance, stable and incidental. Vascular: No hyperdense vessel or unexpected calcification. Skull: Normal. Negative for fracture or focal lesion. Sinuses/Orbits: No acute finding. Other: These results were called by telephone at the time of interpretation on 09/07/2016 at 11:20 am to Dr. Nat Christen , who verbally acknowledged these results. ASPECTS [facility] Stroke Program Early CT Score) - Ganglionic level infarction (caudate, lentiform nuclei, internal capsule, insula, M1-M3 cortex): 7 - Supraganglionic infarction (M4-M6 cortex): 3 Total score (0-10 with 10 being normal): 10 IMPRESSION: Negative head CT. ASPECTS is 10. Electronically Signed   By: Monte Fantasia M.D.   On: 09/07/2016 11:21    Assessment/Plan 51 year old lady presenting with headache and visual changes involving the left eye. Etiology is unclear. Ocular exam by ophthalmologist as well as imaging studies of the brain, including MRI, were unremarkable. Headache is resolved and left eye vision improved. Patient likely experienced manifestations of complicated migraine  headache.  Recommendations: 1. CT angiogram of head and neck with contrast 2. No further neurological intervention if CT angiogram is unremarkable 3. Aspirin 81 mg per day  C.R. Nicole Kindred, MD [facility] [phone]  09/08/2016, 5:52 AM

## 2016-09-08 NOTE — Evaluation (Signed)
Physical Therapy Evaluation Patient Details Name: [name] [name] MRN: [id number] DOB: [birth year] Today's Date: 09/08/2016   History of Present Illness  [name] [name] is an 51 y.o. female with a history of vertigo, tobacco abuse, GERD, depression and anxiety, transferred from [facility] for further evaluation of headache and visual changes. She experienced acute onset headache as well as loss of vision involving her left eye on the morning of 09/07/2016. MRI/ CT negative.  Clinical Impression  Patient evaluated by Physical Therapy with no further acute PT needs identified. All education has been completed and the patient has no further questions. Pt continues to have blurry vision L eye worse than R (prior to this episode, her right eye was her weaker eye). She has returned to mod I with her mobility though and does not currently need any PT follow up. Recommended that she follow up with her eye doctor for further help with her blurred vision.  See below for any follow-up Physical Therapy or equipment needs. PT is signing off. Thank you for this referral.     Follow Up Recommendations No PT follow up    Equipment Recommendations  None recommended by PT    Recommendations for Other Services       Precautions / Restrictions Precautions Precautions: None Restrictions Weight Bearing Restrictions: No      Mobility  Bed Mobility Overal bed mobility: Independent                Transfers Overall transfer level: Independent                  Ambulation/Gait Ambulation/Gait assistance: Modified independent (Device/Increase time) Ambulation Distance (Feet): 200 Feet Assistive device: None Gait Pattern/deviations: Step-through pattern Gait velocity: decreased Gait velocity interpretation: Below normal speed for age/gender General Gait Details: pt stable but slow with ambulation, she reported that her back was sore which is not normal for her as well as  being cautious because of blurred vision, even with glasses on. She was unable to increase her pace when cued. And she requested to return to room after 200' due to feeling tired  and back hurting.   Stairs            Wheelchair Mobility    Modified Rankin (Stroke Patients Only)       Balance Overall balance assessment: Modified Independent                                           Pertinent Vitals/Pain Pain Assessment: Faces Faces Pain Scale: Hurts little more Pain Location: low back Pain Descriptors / Indicators: Aching Pain Intervention(s): Monitored during session    Home Living Family/patient expects to be discharged to:: Private residence Living Arrangements: Spouse/significant other;Children Available Help at Discharge: Family;Available PRN/intermittently Type of Home: Apartment Home Access: Level entry     Home Layout: One level Home Equipment: None      Prior Function Level of Independence: Independent         Comments: 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, 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     [employer]        Extremity/Trunk Assessment   Upper Extremity Assessment Upper Extremity Assessment: Overall WFL for tasks assessed    Lower Extremity Assessment Lower Extremity Assessment: Overall WFL for tasks assessed    Cervical /  Trunk Assessment Cervical / Trunk Assessment: Normal  Communication   Communication: No difficulties  Cognition Arousal/Alertness: Awake/alert Behavior During Therapy: WFL for tasks assessed/performed Overall Cognitive Status: Within Functional Limits for tasks assessed                                        General Comments      Exercises     Assessment/Plan    PT Assessment Patent does not need any further PT services  PT Problem List         PT Treatment Interventions      PT Goals (Current goals can be found in the Care Plan  section)  Acute Rehab PT Goals Patient Stated Goal: return to work and grandkids PT Goal Formulation: All assessment and education complete, DC therapy    Frequency     Barriers to discharge        Co-evaluation               End of Session   Activity Tolerance: Patient tolerated treatment well Patient left: in bed;with call bell/phone within reach;with family/visitor present Nurse Communication: Mobility status PT Visit Diagnosis: Unsteadiness on feet (R26.81)    Time: 9604-5409 PT Time Calculation (min) (ACUTE ONLY): 33 min   Charges:   PT Evaluation $PT Eval Moderate Complexity: 1 Procedure PT Treatments $Gait Training: 8-22 mins   PT G Codes:   PT G-Codes **NOT FOR INPATIENT CLASS** Functional Assessment Tool Used: AM-PAC 6 Clicks Basic Mobility Functional Limitation: Mobility: Walking and moving around Mobility: Walking and Moving Around Current Status (W1191): At least 1 percent but less than 20 percent impaired, limited or restricted Mobility: Walking and Moving Around Goal Status [phone]): At least 1 percent but less than 20 percent impaired, limited or restricted Mobility: Walking and Moving Around Discharge Status [phone]): At least 1 percent but less than 20 percent impaired, limited or restricted    [place], PT  Acute Rehab Services  [phone]   Lawana Chambers Timohty Renbarger 09/08/2016, 11:40 AM

## 2016-09-08 NOTE — Discharge Summary (Signed)
Physician Discharge Summary  [name] [name]:[id number] DOB: 09-Oct-1965 DOA: 09/07/2016  PCP: [facility]  Admit date: 09/07/2016 Discharge date: 09/08/2016  Admitted From: home Disposition:  home  Recommendations for Outpatient Follow-up:  1. Follow up with PCP in 1-2 weeks  Home Health: none Equipment/Devices: none  Discharge Condition: stable  CODE STATUS: Full code Diet recommendation: regular  HPI: Per Dr. Kerry Hough,  [name] [name] is a 51 y.o. female with medical history significant of GERD, tobacco abuse and anxiety presents with sudden onset left vision loss and left sided headache that began this morning. Patient woke up with symptoms. Yesterday she was in her usual state of health. She describes vision loss in her left eye that has persisted since this morning. She describes a headache in the left parietal area. She denies any rashes. She also had numbness and weakness of her left side. She felt it was difficult to walk this morning and was stumbling and falling. No shortness of breath or chest pain. No fever, cough or dysuria. She reports having an eye exam with her optometrist 2 weeks ago which she reports was unremarkable. ED Course: on evaluation in ED, initially code stroke was called, but time of onset of symptoms could not be determined. CT head and MRI brain were found to be negative. She has been referred for admission  Hospital Course: Discharge Diagnoses:  Active Problems:   Hypokalemia   Tobacco abuse   Headache   Vision, loss, sudden, left   Left sided numbness   GERD (gastroesophageal reflux disease)   Vision loss, left eye  Sudden vision loss, left eye -etiology was unclear.  Patient was initially admitted at [facility] and transferred here for ophthalmology and neurology consultation.  She underwent an evaluation by ophthalmology without any acute findings.  She underwent neurologic workup with an MRI as well as a CT  angiogram of the head and neck, which were all unremarkable.  Her symptoms have started to resolve, and she was approaching baseline and she was discharged home in stable condition and was advised to follow-up with her PCP in 1-2 weeks.  Her symptoms may represent a complicated migraine. Left sided numbness/weakness -same as above, without clear etiology, resolved.  B12 and TSH were within normal limits. GERD. Continue PPI Headache. Continue supportive treatment. Imaging has been unremarkable.  Result   Discharge Instructions   Allergies as of 09/08/2016      Reactions   Sulfa Antibiotics Hives, Nausea And Vomiting      Medication List    TAKE these medications   ALPRAZolam 0.5 MG tablet Commonly known as:  XANAX Take 0.5 mg by mouth 3 (three) times daily.   estrogens-methylTEST 1.25-2.5 MG tablet Commonly known as:  ESTRATEST Take 1 tablet by mouth daily.   hydrochlorothiazide 12.5 MG tablet Commonly known as:  HYDRODIURIL Take 1 tablet (12.5 mg total) by mouth daily. Take as needed for swelling   LORazepam 1 MG tablet Commonly known as:  ATIVAN Take 1 tablet (1 mg total) by mouth 3 (three) times daily as needed (dizziness / anxiety).   meclizine 25 MG tablet Commonly known as:  ANTIVERT Take 25 mg by mouth 3 (three) times daily. Notes to patient:  You have had this once today, it should be taken twice more.   naproxen 500 MG tablet Commonly known as:  NAPROSYN Take 1 tablet (500 mg total) by mouth 2 (two) times daily. Notes to patient:  You have had  this once today, it should be taken once more in the evening time.   omeprazole 20 MG tablet Commonly known as:  PRILOSEC OTC Take 20 mg by mouth at bedtime.   pantoprazole 40 MG tablet Commonly known as:  PROTONIX Take 40 mg by mouth daily.   potassium chloride 10 MEQ tablet Commonly known as:  K-DUR Take 1 tablet (10 mEq total) by mouth daily as needed (Take with HCTZ.).   ranitidine 150 MG tablet Commonly known  as:  ZANTAC Take 150 mg by mouth daily.   trimethoprim-polymyxin b ophthalmic solution Commonly known as:  POLYTRIM Place 1 drop into the right eye every 4 (four) hours.      Follow-up Information    [facility] Schedule an appointment as soon as possible for a visit in 2 week(s).   Specialty:  Family Medicine Contact information: [address] [place] [address] [phone]          Allergies  Allergen Reactions  . Sulfa Antibiotics Hives and Nausea And Vomiting    Consultations:  Neurology   Procedures/Studies:  Ct Angio Head W Or Wo Contrast  Result Date: 09/08/2016 CLINICAL DATA:  Left-sided weakness and left eye visual impairment. Headache. EXAM: CT ANGIOGRAPHY HEAD AND NECK TECHNIQUE: Multidetector CT imaging of the head and neck was performed using the standard protocol during bolus administration of intravenous contrast. Multiplanar CT image reconstructions and MIPs were obtained to evaluate the vascular anatomy. Carotid stenosis measurements (when applicable) are obtained utilizing NASCET criteria, using the distal internal carotid diameter as the denominator. CONTRAST:  50 mL Isovue 370 COMPARISON:  Brain MRI 09/07/2016 FINDINGS: CT HEAD FINDINGS Brain: No mass lesion, intraparenchymal hemorrhage or extra-axial collection. No evidence of acute cortical infarct. Brain parenchyma and CSF-containing spaces are normal for age. Vascular: No hyperdense vessel or unexpected calcification. Skull: Normal visualized skull base, calvarium and extracranial soft tissues. Sinuses/Orbits: No sinus fluid levels or advanced mucosal thickening. No mastoid effusion. Normal orbits. CTA NECK FINDINGS Aortic arch: There is no aneurysm or dissection of the visualized ascending aorta or aortic arch. There is a normal variant aortic arch branching pattern with the brachiocephalic and left common carotid arteries sharing a common origin. The visualized proximal  subclavian arteries are normal. Right carotid system: The right common carotid origin is widely patent. There is no common carotid or internal carotid artery dissection or aneurysm. No hemodynamically significant stenosis. Left carotid system: The left common carotid origin is widely patent. There is no common carotid or internal carotid artery dissection or aneurysm. No hemodynamically significant stenosis. Vertebral arteries: The vertebral system is codominant. Both vertebral artery origins are normal. Both vertebral arteries are normal to their confluence with the basilar artery. Skeleton: There is no bony spinal canal stenosis. No lytic or blastic lesions. Other neck: The nasopharynx is clear. The oropharynx and hypopharynx are normal. The epiglottis is normal. The supraglottic larynx, glottis and subglottic larynx are normal. No retropharyngeal collection. The parapharyngeal spaces are preserved. The parotid and submandibular glands are normal. No sialolithiasis or salivary ductal dilatation. The thyroid gland is normal. There is no cervical lymphadenopathy. Upper chest: No pneumothorax or pleural effusion. No nodules or masses. Review of the MIP images confirms the above findings CTA HEAD FINDINGS Anterior circulation: --Intracranial internal carotid arteries: Normal. --Anterior cerebral arteries: Normal. --Middle cerebral arteries: Normal. --Posterior communicating arteries: Present bilaterally. Posterior circulation: --Posterior cerebral arteries: Normal. --Superior cerebellar arteries: Normal. --Basilar artery: Normal. --Anterior inferior cerebellar arteries: Normal. --Posterior  inferior cerebellar arteries: Normal. Venous sinuses: As permitted by contrast timing, patent. Anatomic variants: None Delayed phase: No parenchymal contrast enhancement. Review of the MIP images confirms the above findings IMPRESSION: Normal CTA of the head and neck. Electronically Signed   By: Deatra Robinson M.D.   On: 09/08/2016  14:02   Ct Angio Neck W Or Wo Contrast  Result Date: 09/08/2016 CLINICAL DATA:  Left-sided weakness and left eye visual impairment. Headache. EXAM: CT ANGIOGRAPHY HEAD AND NECK TECHNIQUE: Multidetector CT imaging of the head and neck was performed using the standard protocol during bolus administration of intravenous contrast. Multiplanar CT image reconstructions and MIPs were obtained to evaluate the vascular anatomy. Carotid stenosis measurements (when applicable) are obtained utilizing NASCET criteria, using the distal internal carotid diameter as the denominator. CONTRAST:  50 mL Isovue 370 COMPARISON:  Brain MRI 09/07/2016 FINDINGS: CT HEAD FINDINGS Brain: No mass lesion, intraparenchymal hemorrhage or extra-axial collection. No evidence of acute cortical infarct. Brain parenchyma and CSF-containing spaces are normal for age. Vascular: No hyperdense vessel or unexpected calcification. Skull: Normal visualized skull base, calvarium and extracranial soft tissues. Sinuses/Orbits: No sinus fluid levels or advanced mucosal thickening. No mastoid effusion. Normal orbits. CTA NECK FINDINGS Aortic arch: There is no aneurysm or dissection of the visualized ascending aorta or aortic arch. There is a normal variant aortic arch branching pattern with the brachiocephalic and left common carotid arteries sharing a common origin. The visualized proximal subclavian arteries are normal. Right carotid system: The right common carotid origin is widely patent. There is no common carotid or internal carotid artery dissection or aneurysm. No hemodynamically significant stenosis. Left carotid system: The left common carotid origin is widely patent. There is no common carotid or internal carotid artery dissection or aneurysm. No hemodynamically significant stenosis. Vertebral arteries: The vertebral system is codominant. Both vertebral artery origins are normal. Both vertebral arteries are normal to their confluence with the  basilar artery. Skeleton: There is no bony spinal canal stenosis. No lytic or blastic lesions. Other neck: The nasopharynx is clear. The oropharynx and hypopharynx are normal. The epiglottis is normal. The supraglottic larynx, glottis and subglottic larynx are normal. No retropharyngeal collection. The parapharyngeal spaces are preserved. The parotid and submandibular glands are normal. No sialolithiasis or salivary ductal dilatation. The thyroid gland is normal. There is no cervical lymphadenopathy. Upper chest: No pneumothorax or pleural effusion. No nodules or masses. Review of the MIP images confirms the above findings CTA HEAD FINDINGS Anterior circulation: --Intracranial internal carotid arteries: Normal. --Anterior cerebral arteries: Normal. --Middle cerebral arteries: Normal. --Posterior communicating arteries: Present bilaterally. Posterior circulation: --Posterior cerebral arteries: Normal. --Superior cerebellar arteries: Normal. --Basilar artery: Normal. --Anterior inferior cerebellar arteries: Normal. --Posterior inferior cerebellar arteries: Normal. Venous sinuses: As permitted by contrast timing, patent. Anatomic variants: None Delayed phase: No parenchymal contrast enhancement. Review of the MIP images confirms the above findings IMPRESSION: Normal CTA of the head and neck. Electronically Signed   By: Deatra Robinson M.D.   On: 09/08/2016 14:02   Mr Brain Wo Contrast (neuro Protocol)  Result Date: 09/07/2016 CLINICAL DATA:  Left parietal headache.  Rule out CVA EXAM: MRI HEAD WITHOUT CONTRAST TECHNIQUE: Multiplanar, multiecho pulse sequences of the brain and surrounding structures were obtained without intravenous contrast. COMPARISON:  CT head 09/07/2016 FINDINGS: Brain: Negative for acute or chronic infarction. Negative for hemorrhage, mass, or fluid collection. Ventricle size is normal. Vascular: Normal arterial flow voids. Skull and upper cervical spine: Negative Sinuses/Orbits: Negative Other:  None IMPRESSION:  Negative MRI of the brain. Electronically Signed   By: Marlan Palau M.D.   On: 09/07/2016 12:24   Ct Head Code Stroke Wo Contrast`  Result Date: 09/07/2016 CLINICAL DATA:  Code stroke. Awoke at 5 a.m. with severe headache. Left eye vision changes. Left arm and leg numbness. EXAM: CT HEAD WITHOUT CONTRAST TECHNIQUE: Contiguous axial images were obtained from the base of the skull through the vertex without intravenous contrast. COMPARISON:  03/14/2015 FINDINGS: Brain: No evidence of acute infarction, hemorrhage, hydrocephalus, extra-axial collection or mass lesion/mass effect. Empty sella appearance, stable and incidental. Vascular: No hyperdense vessel or unexpected calcification. Skull: Normal. Negative for fracture or focal lesion. Sinuses/Orbits: No acute finding. Other: These results were called by telephone at the time of interpretation on 09/07/2016 at 11:20 am to Dr. Donnetta Hutching , who verbally acknowledged these results. ASPECTS [facility] Stroke Program Early CT Score) - Ganglionic level infarction (caudate, lentiform nuclei, internal capsule, insula, M1-M3 cortex): 7 - Supraganglionic infarction (M4-M6 cortex): 3 Total score (0-10 with 10 being normal): 10 IMPRESSION: Negative head CT. ASPECTS is 10. Electronically Signed   By: Marnee Spring M.D.   On: 09/07/2016 11:21     Subjective: - no chest pain, shortness of breath, no abdominal pain, nausea or vomiting.   Discharge Exam: Vitals:   09/08/16 0845 09/08/16 1348  BP: (!) 103/91 105/65  Pulse: (!) 58 60  Resp:  18  Temp: 98.6 F (37 C) 97.8 F (36.6 C)   Vitals:   09/08/16 0445 09/08/16 0645 09/08/16 0845 09/08/16 1348  BP: 122/60 117/60 (!) 103/91 105/65  Pulse: (!) 57 76 (!) 58 60  Resp: Temp: 98 F (36.7 C) 97.9 F (36.6 C) 98.6 F (37 C) 97.8 F (36.6 C)  TempSrc: Oral Oral Oral Oral  SpO2: 98% 100% 100% 100%  Weight:      Height:        General: Pt is alert, awake, not in acute  distress Cardiovascular: RRR, S1/S2 +, no rubs, no gallops Respiratory: CTA bilaterally, no wheezing, no rhonchi  The results of significant diagnostics from this hospitalization (including imaging, microbiology, ancillary and laboratory) are listed below for reference.     Microbiology: No results found for this or any previous visit (from the past 240 hour(s)).   Labs: BNP (last 3 results) No results for input(s): BNP in the last 8760 hours. Basic Metabolic Panel:  Recent Labs Lab 09/07/16 1045 09/07/16 1110 09/07/16 2111 09/08/16 0516  NA 135 140  --  138  K 2.8* 2.8*  --  4.1  CL 98* 98*  --  105  CO2 28  --   --  28  GLUCOSE 112* 108*  --  103*  BUN 11 11  --  12  CREATININE 0.99 1.00  --  1.16*  CALCIUM 9.6  --   --  8.9  MG  --   --  1.9  --    Liver Function Tests:  Recent Labs Lab 09/07/16 1045  AST 24  ALT 20  ALKPHOS 69  BILITOT 0.7  PROT 7.8  ALBUMIN 4.2   No results for input(s): LIPASE, AMYLASE in the last 168 hours. No results for input(s): AMMONIA in the last 168 hours. CBC:  Recent Labs Lab 09/07/16 1045 09/07/16 1110 09/08/16 0516  WBC 6.8  --  5.8  NEUTROABS 4.7  --   --   HGB 14.8 15.0 12.5  HCT 42.6 44.0 37.9  MCV 93.4  --  94.0  PLT 220  --  203   Cardiac Enzymes: No results for input(s): CKTOTAL, CKMB, CKMBINDEX, TROPONINI in the last 168 hours. BNP: Invalid input(s): POCBNP CBG: No results for input(s): GLUCAP in the last 168 hours. D-Dimer No results for input(s): DDIMER in the last 72 hours. Hgb A1c No results for input(s): HGBA1C in the last 72 hours. Lipid Profile No results for input(s): CHOL, HDL, LDLCALC, TRIG, CHOLHDL, LDLDIRECT in the last 72 hours. Thyroid function studies  Recent Labs  09/07/16 2111  TSH 1.233   Anemia work up  Recent Labs  09/07/16 2111  VITAMINB12 323   Urinalysis    Component Value Date/Time   COLORURINE YELLOW 09/07/2016 1340   APPEARANCEUR HAZY (A) 09/07/2016 1340    LABSPEC 1.016 09/07/2016 1340   PHURINE 7.0 09/07/2016 1340   GLUCOSEU NEGATIVE 09/07/2016 1340   HGBUR NEGATIVE 09/07/2016 1340   BILIRUBINUR NEGATIVE 09/07/2016 1340   KETONESUR NEGATIVE 09/07/2016 1340   PROTEINUR NEGATIVE 09/07/2016 1340   UROBILINOGEN 0.2 03/02/2015 2100   NITRITE NEGATIVE 09/07/2016 1340   LEUKOCYTESUR NEGATIVE 09/07/2016 1340   Sepsis Labs Invalid input(s): PROCALCITONIN,  WBC,  LACTICIDVEN Microbiology No results found for this or any previous visit (from the past 240 hour(s)).   Time coordinating discharge: 25 minutes  SIGNED:  Pamella Pert, MD  [facility] 09/08/2016, 3:32 PM Pager [phone]  If 7PM-7AM, please contact night-coverage [email or web address] Password [email or web address]

## 2016-09-08 NOTE — Care Management Note (Signed)
Case Management Note  Patient Details  Name: [name] [name] MRN: [id number] Date of Birth: Oct 15, 1965  Subjective/Objective:                 Patient with order to DC to home. No CM consult, order, or needs identified.    Action/Plan:  DC to home.  Expected Discharge Date:  09/08/16               Expected Discharge Plan:  Home/Self Care  In-House Referral:     Discharge planning Services  CM Consult  Post Acute Care Choice:    Choice offered to:     DME Arranged:    DME Agency:     HH Arranged:    HH Agency:     Status of Service:  Completed, signed off  If discussed at [employer] of Stay Meetings, dates discussed:    Additional Comments:  Lawerance Sabal, RN 09/08/2016, 2:47 PM

## 2016-09-08 NOTE — Progress Notes (Signed)
Neurology Progress Note  Patient seen by Dr. Roseanne Reno this morning at [phone]. I have reviewed the chart. In brief, she presented with headache associated with blurred vision in the L eye and L sided numbness. MRI brain and CTH were unremarkable. Symptoms have resolved with the exception of residual blurred vision that is improving. Ophtho saw the patient on 4/20 and found no abnormalities, recommended outpt f/u. Dr. Vonita Moss recommended CTA head/neck.  I have personally and independently reviewed the CTA of the head and neck from today. This is normal. I have reviewed the radiology interpretation as well--formal read is that this is a normal CTA.   Presentation likely represented complicated migraine which has since resolved. No further w/u, no further recs. Ophtho f/u for blurred vision. PCP f/u per routine. Will sign off, call if any new issues. OK for d/c from neurology perspective.

## 2016-09-11 DIAGNOSIS — H2513 Age-related nuclear cataract, bilateral: Secondary | ICD-10-CM | POA: Diagnosis not present

## 2016-09-11 DIAGNOSIS — H538 Other visual disturbances: Secondary | ICD-10-CM | POA: Diagnosis not present

## 2016-09-11 DIAGNOSIS — H04123 Dry eye syndrome of bilateral lacrimal glands: Secondary | ICD-10-CM | POA: Diagnosis not present

## 2016-09-12 ENCOUNTER — Other Ambulatory Visit: Payer: Self-pay | Admitting: Obstetrics and Gynecology

## 2016-09-12 DIAGNOSIS — Z1231 Encounter for screening mammogram for malignant neoplasm of breast: Secondary | ICD-10-CM | POA: Diagnosis not present

## 2016-09-12 DIAGNOSIS — R200 Anesthesia of skin: Secondary | ICD-10-CM | POA: Diagnosis not present

## 2016-09-12 DIAGNOSIS — Z01419 Encounter for gynecological examination (general) (routine) without abnormal findings: Secondary | ICD-10-CM | POA: Diagnosis not present

## 2016-09-12 DIAGNOSIS — E876 Hypokalemia: Secondary | ICD-10-CM | POA: Diagnosis not present

## 2016-11-15 ENCOUNTER — Ambulatory Visit (HOSPITAL_COMMUNITY)
Admission: RE | Admit: 2016-11-15 | Discharge: 2016-11-15 | Disposition: A | Discharge status: Home / Self Care | Payer: 59 | Source: Ambulatory Visit | Attending: Family Medicine | Admitting: Family Medicine

## 2016-11-15 ENCOUNTER — Other Ambulatory Visit (HOSPITAL_COMMUNITY): Payer: Self-pay | Admitting: Family Medicine

## 2016-11-15 ENCOUNTER — Encounter (HOSPITAL_COMMUNITY): Payer: Self-pay

## 2016-11-15 DIAGNOSIS — R109 Unspecified abdominal pain: Secondary | ICD-10-CM | POA: Diagnosis not present

## 2016-11-15 DIAGNOSIS — R911 Solitary pulmonary nodule: Secondary | ICD-10-CM | POA: Diagnosis not present

## 2016-11-15 DIAGNOSIS — R300 Dysuria: Secondary | ICD-10-CM | POA: Diagnosis not present

## 2016-11-15 DIAGNOSIS — Z6824 Body mass index (BMI) 24.0-24.9, adult: Secondary | ICD-10-CM | POA: Diagnosis not present

## 2016-11-15 DIAGNOSIS — R197 Diarrhea, unspecified: Secondary | ICD-10-CM | POA: Diagnosis not present

## 2016-11-15 LAB — POCT I-STAT CREATININE: Creatinine, Ser: 1 mg/dL (ref 0.44–1.00)

## 2016-11-15 MED ORDER — IOPAMIDOL (ISOVUE-300) INJECTION 61%
100.0000 mL | Freq: Once | INTRAVENOUS | Status: AC | PRN
  Administered 2016-11-15: 100 mL via INTRAVENOUS

## 2016-11-20 ENCOUNTER — Institutional Professional Consult (permissible substitution): Payer: Commercial Managed Care - HMO | Admitting: Neurology

## 2016-11-22 ENCOUNTER — Encounter: Payer: Self-pay | Admitting: Gastroenterology

## 2016-11-22 ENCOUNTER — Telehealth: Payer: Self-pay

## 2016-11-22 ENCOUNTER — Other Ambulatory Visit: Payer: Self-pay

## 2016-11-22 ENCOUNTER — Ambulatory Visit (INDEPENDENT_AMBULATORY_CARE_PROVIDER_SITE_OTHER): Payer: 59 | Admitting: Gastroenterology

## 2016-11-22 VITALS — BP 127/74 | HR 79 | Temp 97.2°F | Ht 62.0 in | Wt 136.6 lb

## 2016-11-22 DIAGNOSIS — R1013 Epigastric pain: Secondary | ICD-10-CM | POA: Diagnosis not present

## 2016-11-22 LAB — COMPLETE METABOLIC PANEL WITH GFR
ALK PHOS: 78 U/L (ref 33–130)
ALT: 13 U/L (ref 6–29)
AST: 14 U/L (ref 10–35)
Albumin: 3.8 g/dL (ref 3.6–5.1)
BILIRUBIN TOTAL: 0.3 mg/dL (ref 0.2–1.2)
BUN: 14 mg/dL (ref 7–25)
CO2: 26 mmol/L (ref 20–31)
Calcium: 9.3 mg/dL (ref 8.6–10.4)
Chloride: 104 mmol/L (ref 98–110)
Creat: 0.96 mg/dL (ref 0.50–1.05)
GFR, EST AFRICAN AMERICAN: 80 mL/min (ref >=60)
GFR, EST NON AFRICAN AMERICAN: 69 mL/min (ref >=60)
GLUCOSE: 98 mg/dL (ref 65–99)
POTASSIUM: 3.6 mmol/L (ref 3.5–5.3)
SODIUM: 138 mmol/L (ref 135–146)
Total Protein: 6.3 g/dL (ref 6.1–8.1)

## 2016-11-22 LAB — CBC WITH DIFFERENTIAL/PLATELET
BASOS ABS: 57 {cells}/uL (ref 0–200)
Basophils Relative: 1 %
EOS ABS: 57 {cells}/uL (ref 15–500)
EOS PCT: 1 %
HCT: 37.9 % (ref 35.0–45.0)
Hemoglobin: 12.5 g/dL (ref 11.7–15.5)
LYMPHS PCT: 27 %
Lymphs Abs: 1539 cells/uL (ref 850–3900)
MCH: 31.5 pg (ref 27.0–33.0)
MCHC: 33 g/dL (ref 32.0–36.0)
MCV: 95.5 fL (ref 80.0–100.0)
MONOS PCT: 8 %
MPV: 10.9 fL (ref 7.5–12.5)
Monocytes Absolute: 456 cells/uL (ref 200–950)
NEUTROS PCT: 63 %
Neutro Abs: 3591 cells/uL (ref 1500–7800)
Platelets: 202 10*3/uL (ref 140–400)
RBC: 3.97 MIL/uL (ref 3.80–5.10)
RDW: 13.4 % (ref 11.0–15.0)
WBC: 5.7 10*3/uL (ref 3.8–10.8)

## 2016-11-22 MED ORDER — PANTOPRAZOLE SODIUM 40 MG PO TBEC: 40.0000 mg | DELAYED_RELEASE_TABLET | Freq: Two times a day (BID) | ORAL | 3 refills | Status: DC

## 2016-11-22 NOTE — Telephone Encounter (Signed)
Called and informed pt of pre-op appt 11/29/16 at 1:45pm. Letter also mailed.

## 2016-11-22 NOTE — Patient Instructions (Signed)
[facility] Case approved. Auth# [id number].

## 2016-11-22 NOTE — Patient Instructions (Signed)
I have ordered blood work today to check for anemia, liver numbers, and kidney numbers. You have a recent thyroid test on file, so I am not rechecking that.  We have scheduled you for an upper endoscopy with Dr. Darrick PennaFields in the near future. You will need a colonoscopy at some point once your upper GI symptoms are addressed.  Please follow the gastroparesis diet handout for now. I have also increased Protonix to 30 minutes before breakfast and dinner.

## 2016-11-22 NOTE — Progress Notes (Signed)
Primary Care Physician:  [facility] Primary Gastroenterologist:  Dr. Darrick Penna   Chief Complaint  Patient presents with  . Abdominal Pain    Reglan  . Diarrhea    when ate    HPI:   [name] [name] is a 51 y.o. female presenting today at the request of her PCP secondary to abdominal pain. CT June 28th with dilated stomach filled with ingested material, not typical of a bezoar. Further findings as outlined in CT.   Started on Reglan and feeling much better; however, she can't take at work because it makes her sleepy. Reglan has helped with abdominal discomfort. No nausea since starting Reglan. States in April she was told she may have had TIAs. Since then, she has noted progressive upper abdominal discomfort, poor appetite, weight loss of about 14 lbs. Doesn't take NSAIDs. Eats breakfast without difficulty but has issues with remaining meals due to early satiety. Intermittent abdominal discomfort still noted although improved from first onset. Protonix once daily. Notes chronic intermittent diarrhea. No rectal bleeding. Took a [employer] pm recently but denies use of this routinely. Last colonoscopy/EGD in 2007 by Dr. Juanda Chance and normal.   She thinks she may have a history of polyps in remote past.   Past Medical History:  Diagnosis Date  . Anxiety   . Bradycardia 03/02/2015  . Depression   . GERD (gastroesophageal reflux disease)   . History of hysterectomy   . History of oophorectomy   . Hx of cholecystectomy   . Near syncope 03/02/2015  . Sciatica neuralgia, right   . Tobacco abuse 03/03/2015  . Vertigo     Past Surgical History:  Procedure Laterality Date  . ABDOMINAL HYSTERECTOMY    . CESAREAN SECTION     x2  . CHOLECYSTECTOMY    . COLONOSCOPY WITH ESOPHAGOGASTRODUODENOSCOPY (EGD)  2007   Dr. Juanda Chance: normal  . HEMORRHOID SURGERY      Current Outpatient Prescriptions  Medication Sig Dispense Refill  . ALPRAZolam (XANAX) 0.5 MG tablet Take  0.5 mg by mouth 3 (three) times daily.    . hydrochlorothiazide (HYDRODIURIL) 12.5 MG tablet Take 1 tablet (12.5 mg total) by mouth daily. Take as needed for swelling    . LORazepam (ATIVAN) 1 MG tablet Take 1 tablet (1 mg total) by mouth 3 (three) times daily as needed (dizziness / anxiety). 15 tablet 0  . meclizine (ANTIVERT) 25 MG tablet Take 25 mg by mouth 3 (three) times daily.  0  . metoCLOPramide (REGLAN) 10 MG tablet Take 10 mg by mouth 4 (four) times daily -  before meals and at bedtime.  0  . pantoprazole (PROTONIX) 40 MG tablet Take 40 mg by mouth daily.  2  . potassium chloride (K-DUR) 10 MEQ tablet Take 1 tablet (10 mEq total) by mouth daily as needed (Take with HCTZ.). 30 tablet 2  . pantoprazole (PROTONIX) 40 MG tablet Take 1 tablet (40 mg total) by mouth 2 (two) times daily before a meal. 60 tablet 3   No current facility-administered medications for this visit.     Allergies as of 11/22/2016 - Review Complete 11/22/2016  Allergen Reaction Noted  . Sulfa antibiotics Hives and Nausea And Vomiting 03/06/2011    Family History  Problem Relation Age of Onset  . ALS Mother   . Cardiomyopathy Father   . Colon cancer Neg Hx     Social History   Social History  . Marital status: Single  Spouse name: N/A  . Number of children: N/A  . Years of education: N/A   Occupational History  . Not on file.   Social History Main Topics  . Smoking status: Current Every Day Smoker    Packs/day: 0.10    Years: 20.00    Types: Cigarettes  . Smokeless tobacco: Never Used  . Alcohol use 0.0 oz/week     Comment: seldom  . Drug use: Yes    Frequency: 2.0 times per week     Comment: marijuana maybe once a week  . Sexual activity: Not on file   Other Topics Concern  . Not on file   Social History Narrative   Drinks 5-6 cups of coffee daily.    Review of Systems: As mentioned in HPI.   Physical Exam: BP 127/74   Pulse 79   Temp (!) 97.2 F (36.2 C) (Oral)   Ht 5\' 2"   (1.575 m)   Wt 136 lb 9.6 oz (62 kg)   LMP 05/21/1997   BMI 24.98 kg/m  General:   Alert and oriented. Pleasant and cooperative. Well-nourished and well-developed.  Head:  Normocephalic and atraumatic. Eyes:  Without icterus, sclera clear and conjunctiva pink.  Ears:  Normal auditory acuity. Nose:  No deformity, discharge,  or lesions. Mouth:  No deformity or lesions, oral mucosa pink.  Lungs:  Clear to auscultation bilaterally. No wheezes, rales, or rhonchi. No distress.  Heart:  S1, S2 present without murmurs appreciated.  Abdomen:  +BS, soft, mild TTP upper abdomen and non-distended. No HSM noted. No guarding or rebound. No masses appreciated.  Rectal:  Deferred  Msk:  Symmetrical without gross deformities. Normal posture. Extremities:  Without  edema. Neurologic:  Alert and  oriented x4;  grossly normal neurologically. Psych:  Alert and cooperative. Normal mood and affect.

## 2016-11-23 ENCOUNTER — Encounter: Payer: Self-pay | Admitting: Neurology

## 2016-11-24 DIAGNOSIS — R1013 Epigastric pain: Secondary | ICD-10-CM | POA: Insufficient documentation

## 2016-11-24 NOTE — Assessment & Plan Note (Addendum)
51 year old female with new onset dyspepsia in April 2018, with CT noting dilated stomach filled with ingested material. She was empirically started on Reglan by PCP with improvement in discomfort and nausea. However, abdominal discomfort is still present along with early satiety, lack of appetite, and reported weight loss. Rare NSAIDs if any. She does note taking a Goody's pm recently but otherwise denies routine use of this. Last EGD in 2007 normal.   Recommend EGD for direct evaluation of upper GI tract. May benefit from GES in future. Limit Reglan as this causes drowsiness. Follow gastroparesis diet for now and increase PPI to BID. Check routine labs to include CBC, CMP.   Proceed with upper endoscopy in the near future with Dr. Darrick PennaFields. The risks, benefits, and alternatives have been discussed in detail with patient. They have stated understanding and desire to proceed.  PROPOFOL due to polypharmacy, marijuana use Will need routine screening colonoscopy once upper GI symptoms addressed.

## 2016-11-25 NOTE — Progress Notes (Signed)
REVIEWED. PT NEEDS A GASTROPARESIS DIET HANDOUT AND SHE SHOULD FOLLOW IT.

## 2016-11-26 ENCOUNTER — Institutional Professional Consult (permissible substitution): Payer: Self-pay | Admitting: Neurology

## 2016-11-26 NOTE — Progress Notes (Signed)
cc'ed to pcp °

## 2016-11-27 NOTE — Progress Notes (Signed)
PT is aware of results.  

## 2016-11-27 NOTE — Progress Notes (Signed)
Ms. [name] [name]: your liver numbers, renal function, electrolytes, and blood count are all normal. ([place])

## 2016-11-29 ENCOUNTER — Encounter (HOSPITAL_COMMUNITY)
Admission: RE | Admit: 2016-11-29 | Discharge: 2016-11-29 | Disposition: A | Discharge status: Home / Self Care | Payer: No Typology Code available for payment source | Source: Ambulatory Visit | Attending: Gastroenterology | Admitting: Gastroenterology

## 2016-11-29 ENCOUNTER — Encounter (HOSPITAL_COMMUNITY): Payer: Self-pay

## 2016-12-04 ENCOUNTER — Ambulatory Visit (HOSPITAL_COMMUNITY)
Admission: RE | Admit: 2016-12-04 | Discharge: 2016-12-04 | Disposition: A | Discharge status: Home / Self Care | Payer: 59 | Source: Ambulatory Visit | Attending: Gastroenterology | Admitting: Gastroenterology

## 2016-12-04 ENCOUNTER — Ambulatory Visit (HOSPITAL_COMMUNITY): Payer: 59 | Admitting: Anesthesiology

## 2016-12-04 ENCOUNTER — Encounter (HOSPITAL_COMMUNITY)
Admission: RE | Disposition: A | Discharge status: Home / Self Care | Payer: Self-pay | Source: Ambulatory Visit | Attending: Gastroenterology

## 2016-12-04 ENCOUNTER — Encounter (HOSPITAL_COMMUNITY): Payer: Self-pay

## 2016-12-04 DIAGNOSIS — K5281 Eosinophilic gastritis or gastroenteritis: Secondary | ICD-10-CM | POA: Insufficient documentation

## 2016-12-04 DIAGNOSIS — Z9049 Acquired absence of other specified parts of digestive tract: Secondary | ICD-10-CM | POA: Insufficient documentation

## 2016-12-04 DIAGNOSIS — F17210 Nicotine dependence, cigarettes, uncomplicated: Secondary | ICD-10-CM | POA: Diagnosis not present

## 2016-12-04 DIAGNOSIS — R1013 Epigastric pain: Secondary | ICD-10-CM | POA: Diagnosis not present

## 2016-12-04 DIAGNOSIS — Z7982 Long term (current) use of aspirin: Secondary | ICD-10-CM | POA: Diagnosis not present

## 2016-12-04 DIAGNOSIS — F419 Anxiety disorder, unspecified: Secondary | ICD-10-CM | POA: Insufficient documentation

## 2016-12-04 DIAGNOSIS — K2970 Gastritis, unspecified, without bleeding: Secondary | ICD-10-CM | POA: Diagnosis not present

## 2016-12-04 DIAGNOSIS — Z90710 Acquired absence of both cervix and uterus: Secondary | ICD-10-CM | POA: Insufficient documentation

## 2016-12-04 DIAGNOSIS — F329 Major depressive disorder, single episode, unspecified: Secondary | ICD-10-CM | POA: Insufficient documentation

## 2016-12-04 DIAGNOSIS — R197 Diarrhea, unspecified: Secondary | ICD-10-CM

## 2016-12-04 DIAGNOSIS — K317 Polyp of stomach and duodenum: Secondary | ICD-10-CM | POA: Insufficient documentation

## 2016-12-04 DIAGNOSIS — K3189 Other diseases of stomach and duodenum: Secondary | ICD-10-CM | POA: Diagnosis not present

## 2016-12-04 DIAGNOSIS — K219 Gastro-esophageal reflux disease without esophagitis: Secondary | ICD-10-CM | POA: Insufficient documentation

## 2016-12-04 DIAGNOSIS — Z79899 Other long term (current) drug therapy: Secondary | ICD-10-CM | POA: Insufficient documentation

## 2016-12-04 HISTORY — PX: ESOPHAGOGASTRODUODENOSCOPY (EGD) WITH PROPOFOL: SHX5813

## 2016-12-04 HISTORY — PX: BIOPSY: SHX5522

## 2016-12-04 SURGERY — ESOPHAGOGASTRODUODENOSCOPY (EGD) WITH PROPOFOL
Anesthesia: Monitor Anesthesia Care

## 2016-12-04 MED ORDER — FENTANYL CITRATE (PF) 100 MCG/2ML IJ SOLN
INTRAMUSCULAR | Status: AC
  Filled 2016-12-04: qty 2

## 2016-12-04 MED ORDER — LIDOCAINE HCL (CARDIAC) 10 MG/ML IV SOLN
INTRAVENOUS | Status: DC | PRN
Start: 2016-12-04 — End: 2016-12-04
  Administered 2016-12-04: 50 mg via INTRAVENOUS

## 2016-12-04 MED ORDER — LIDOCAINE VISCOUS 2 % MT SOLN
OROMUCOSAL | Status: AC
  Filled 2016-12-04: qty 15

## 2016-12-04 MED ORDER — PROPOFOL 10 MG/ML IV BOLUS
INTRAVENOUS | Status: DC | PRN
  Administered 2016-12-04: 20 mg via INTRAVENOUS

## 2016-12-04 MED ORDER — MIDAZOLAM HCL 2 MG/2ML IJ SOLN
INTRAMUSCULAR | Status: AC
  Filled 2016-12-04: qty 2

## 2016-12-04 MED ORDER — MIDAZOLAM HCL 2 MG/2ML IJ SOLN
1.0000 mg | INTRAMUSCULAR | Status: AC
  Administered 2016-12-04: 2 mg via INTRAVENOUS

## 2016-12-04 MED ORDER — LIDOCAINE HCL (PF) 1 % IJ SOLN
INTRAMUSCULAR | Status: AC
  Filled 2016-12-04: qty 5

## 2016-12-04 MED ORDER — LACTATED RINGERS IV SOLN
INTRAVENOUS | Status: DC
  Administered 2016-12-04: 07:00:00 via INTRAVENOUS

## 2016-12-04 MED ORDER — PROPOFOL 10 MG/ML IV BOLUS
INTRAVENOUS | Status: AC
  Filled 2016-12-04: qty 140

## 2016-12-04 MED ORDER — CHLORHEXIDINE GLUCONATE CLOTH 2 % EX PADS
6.0000 | MEDICATED_PAD | Freq: Once | CUTANEOUS | Status: DC
Start: 2016-12-04 — End: 2016-12-06

## 2016-12-04 MED ORDER — FENTANYL CITRATE (PF) 100 MCG/2ML IJ SOLN
25.0000 ug | Freq: Once | INTRAMUSCULAR | Status: AC
  Administered 2016-12-04: 25 ug via INTRAVENOUS

## 2016-12-04 MED ORDER — LIDOCAINE VISCOUS 2 % MT SOLN
15.0000 mL | Freq: Once | OROMUCOSAL | Status: AC
Start: 2016-12-04 — End: 2016-12-04
  Administered 2016-12-04: 15 mL via OROMUCOSAL

## 2016-12-04 MED ORDER — CHLORHEXIDINE GLUCONATE CLOTH 2 % EX PADS: 6.0000 | MEDICATED_PAD | Freq: Once | CUTANEOUS | Status: DC

## 2016-12-04 MED ORDER — METOCLOPRAMIDE HCL 10 MG PO TABS: 10.0000 mg | ORAL_TABLET | Freq: Four times a day (QID) | ORAL | 1 refills | Status: DC | PRN

## 2016-12-04 MED ORDER — PROPOFOL 500 MG/50ML IV EMUL
INTRAVENOUS | Status: DC | PRN
  Administered 2016-12-04: 125 ug/kg/min via INTRAVENOUS

## 2016-12-04 NOTE — H&P (Addendum)
Primary Care Physician:  [facility], [facility] Primary Gastroenterologist:  Dr. Darrick PennaFields  Pre-Procedure History & Physical: HPI:  [name] [name] is a 51 y.o. female here for DYSPEPSIA.  Past Medical History:  Diagnosis Date  . Anxiety   . Bradycardia 03/02/2015  . Depression   . GERD (gastroesophageal reflux disease)   . History of hysterectomy   . History of oophorectomy   . Hx of cholecystectomy   . Near syncope 03/02/2015  . Sciatica neuralgia, right   . Tobacco abuse 03/03/2015  . Vertigo     Past Surgical History:  Procedure Laterality Date  . ABDOMINAL HYSTERECTOMY    . CESAREAN SECTION     x2  . CHOLECYSTECTOMY    . COLONOSCOPY WITH ESOPHAGOGASTRODUODENOSCOPY (EGD)  2007   Dr. Juanda ChanceBrodie: normal  . HEMORRHOID SURGERY      Prior to Admission medications   Medication Sig Start Date End Date Taking? Authorizing Provider  ADDERALL XR 30 MG 24 hr capsule Take 30 mg by mouth daily as needed. For focusing while at work 11/03/16  Yes [provider]  ALPRAZolam Prudy Feeler(XANAX) 1 MG tablet Take 1 mg by mouth 4 (four) times daily as needed. For anxiety. 11/17/16  Yes [provider]  Aspirin-Acetaminophen (GOODY BODY PAIN) 500-325 MG PACK Take 1 packet by mouth every 8 (eight) hours as needed (for headaches/pain.).   Yes [provider]  ciprofloxacin (CIPRO) 500 MG tablet Take 500 mg by mouth 2 (two) times daily. 11/23/16  Yes [provider]  hydrochlorothiazide (MICROZIDE) 12.5 MG capsule Take 12.5 mg by mouth daily as needed. For fluid retention/swelling. 11/13/16  Yes [provider]  KLOR-CON M10 10 MEQ tablet Take 10 mEq by mouth every other day. 09/12/16  Yes [provider]  metoCLOPramide (REGLAN) 10 MG tablet Take 10 mg by mouth 4 (four) times daily -  before meals and at bedtime. 11/16/16  Yes [provider]  pantoprazole (PROTONIX) 40 MG tablet Take 1 tablet (40 mg total) by mouth 2 (two) times daily before a  meal. 11/22/16  Yes Gelene MinkBoone, Anna W, NP  traMADol (ULTRAM) 50 MG tablet Take 50 mg by mouth 4 (four) times daily as needed. FOR PAIN. 11/16/16  Yes [provider]    Allergies as of 11/22/2016 - Review Complete 11/22/2016  Allergen Reaction Noted  . Sulfa antibiotics Hives and Nausea And Vomiting 03/06/2011    Family History  Problem Relation Age of Onset  . ALS Mother   . Cardiomyopathy Father   . Colon cancer Neg Hx     Social History   Social History  . Marital status: Single    Spouse name: N/A  . Number of children: N/A  . Years of education: N/A   Occupational History  . Not on file.   Social History Main Topics  . Smoking status: Current Every Day Smoker    Packs/day: 0.10    Years: 20.00    Types: Cigarettes  . Smokeless tobacco: Never Used  . Alcohol use 0.0 oz/week     Comment: seldom  . Drug use: Yes    Frequency: 2.0 times per week    Types: Marijuana     Comment: marijuana maybe once a week  . Sexual activity: Not on file   Other Topics Concern  . Not on file   Social History Narrative   Drinks 5-6 cups of coffee daily.    Review of Systems: See HPI, otherwise negative ROS   Physical  Exam: BP 114/81   Pulse 62   Temp 97.9 F (36.6 C) (Oral)   Resp 20   LMP 05/21/1997   SpO2 99%  General:   Alert,  pleasant and cooperative in NAD Head:  Normocephalic and atraumatic. Neck:  Supple; Lungs:  Clear throughout to auscultation.    Heart:  Regular rate and rhythm. Abdomen:  Soft, nontender and nondistended. Normal bowel sounds, without guarding, and without rebound.   Neurologic:  Alert and  oriented x4;  grossly normal neurologically.  Impression/Plan:    DYSPEPSIA  PLAN:  EGD TODAY. DISCUSSED PROCEDURE, BENEFITS, & RISKS: < 1% chance of medication reaction, bleeding, OR perforation.

## 2016-12-04 NOTE — Anesthesia Preprocedure Evaluation (Signed)
Anesthesia Evaluation  Patient identified by MRN, date of birth, ID band Patient awake    Reviewed: Allergy & Precautions, NPO status , Patient's Chart, lab work & pertinent test results  Airway Mallampati: II  TM Distance: >3 FB     Dental  (+) Teeth Intact   Pulmonary Current Smoker,    breath sounds clear to auscultation       Cardiovascular + dysrhythmias  Rhythm:Regular Rate:Normal  Near syncope hx. Hx bradycardia    Neuro/Psych  Headaches, PSYCHIATRIC DISORDERS Anxiety Depression  Neuromuscular disease    GI/Hepatic GERD  Medicated,  Endo/Other    Renal/GU      Musculoskeletal   Abdominal   Peds  Hematology   Anesthesia Other Findings   Reproductive/Obstetrics                             Anesthesia Physical Anesthesia Plan  ASA: II  Anesthesia Plan: MAC   Post-op Pain Management:    Induction: Intravenous  PONV Risk Score and Plan:   Airway Management Planned: Simple Face Mask  Additional Equipment:   Intra-op Plan:   Post-operative Plan:   Informed Consent: I have reviewed the patients History and Physical, chart, labs and discussed the procedure including the risks, benefits and alternatives for the proposed anesthesia with the patient or authorized representative who has indicated his/her understanding and acceptance.     Plan Discussed with:   Anesthesia Plan Comments:         Anesthesia Quick Evaluation

## 2016-12-04 NOTE — Anesthesia Postprocedure Evaluation (Signed)
Anesthesia Post Note  Patient: [name] [name]  Procedure(s) Performed: Procedure(s) (LRB): ESOPHAGOGASTRODUODENOSCOPY (EGD) WITH PROPOFOL (N/A) BIOPSY  Patient location during evaluation: PACU Anesthesia Type: MAC Level of consciousness: awake and alert Pain management: satisfactory to patient Vital Signs Assessment: post-procedure vital signs reviewed and stable Respiratory status: spontaneous breathing Cardiovascular status: stable Anesthetic complications: no     Last Vitals:  Vitals:   12/04/16 0825 12/04/16 0828  BP: 118/64 124/64  Pulse: (!) 52 (!) 52  Resp: 16 16  Temp:  36.4 C    Last Pain:  Vitals:   12/04/16 0828  TempSrc: Oral                 Yonas Bunda

## 2016-12-04 NOTE — Transfer of Care (Signed)
Immediate Anesthesia Transfer of Care Note  Patient: [name] [name]  Procedure(s) Performed: Procedure(s) with comments: ESOPHAGOGASTRODUODENOSCOPY (EGD) WITH PROPOFOL (N/A) - 7:30am BIOPSY - gastric and duodenal biopsy  Patient Location: PACU  Anesthesia Type:MAC  Level of Consciousness: awake and patient cooperative  Airway & Oxygen Therapy: Patient Spontanous Breathing and Patient connected to nasal cannula oxygen  Post-op Assessment: Report given to RN and Post -op Vital signs reviewed and stable  Post vital signs: Reviewed and stable  Last Vitals:  Vitals:   12/04/16 0710 12/04/16 0715  BP: 116/71 114/81  Pulse:    Resp: 20 20  Temp:      Last Pain:  Vitals:   12/04/16 0640  TempSrc: Oral         Complications: No apparent anesthesia complications

## 2016-12-04 NOTE — Discharge Instructions (Signed)
YOUR NAUSEA AND VOMITING & UPPER ABDOMINAL PAIN ARE MOST LIKELY DUE TO TO GASTRITIS, REFLUX, AND LESS LIKELY GASTROPARESIS. I biopsied your stomach AND SMALL BOWEL.   AVOID REFLUX TRIGGERS. SEE INFO BELOW.  CUT DOWN ON YOUR SMOKING.  FOLLOW A LOW FAT DIET. MEATS SHOULD BE BAKED, BROILED, OR BOILED. AVOID FRIED FOODS.SEE INFO BELOW.  AVOID GOODY POWDERS BECAUSE THEY CAN CAUSE UPPER ABDOMINAL PAIN AND GASTRITIS.  CONTINUE PROTONIX. TAKE 30 MINUTES PRIOR TO MEALS TWICE DAILY.  USE REGLAN IF NEEDED FOR NAUSEA OR VOMITING.  PLEASE CALL IN ONE MONTH IF SYMPTOMS ARE NOT IMPROVED.   FOLLOW UP IN 3 MOS.  UPPER ENDOSCOPY AFTER CARE Read the instructions outlined below and refer to this sheet in the next week. These discharge instructions provide you with general information on caring for yourself after you leave the hospital. While your treatment has been planned according to the most current medical practices available, unavoidable complications occasionally occur. If you have any problems or questions after discharge, call DR. Marialy Urbanczyk, [phone].  ACTIVITY  You may resume your regular activity, but move at a slower pace for the next 24 hours.   Take frequent rest periods for the next 24 hours.   Walking will help get rid of the air and reduce the bloated feeling in your belly (abdomen).   No driving for 24 hours (because of the medicine (anesthesia) used during the test).   You may shower.   Do not sign any important legal documents or operate any machinery for 24 hours (because of the anesthesia used during the test).    NUTRITION  Drink plenty of fluids.   You may resume your normal diet as instructed by your doctor.   Begin with a light meal and progress to your normal diet. Heavy or fried foods are harder to digest and may make you feel sick to your stomach (nauseated).   Avoid alcoholic beverages for 24 hours or as instructed.    MEDICATIONS  You may resume your  normal medications.   WHAT YOU CAN EXPECT TODAY  Some feelings of bloating in the abdomen.   Passage of more gas than usual.    IF YOU HAD A BIOPSY TAKEN DURING THE UPPER ENDOSCOPY:  Eat a soft diet IF YOU HAVE NAUSEA, BLOATING, ABDOMINAL PAIN, OR VOMITING.    FINDING OUT THE RESULTS OF YOUR TEST Not all test results are available during your visit. DR. Darrick Penna WILL CALL YOU WITHIN 14 DAYS OF YOUR PROCEDUE WITH YOUR RESULTS. Do not assume everything is normal if you have not heard from DR. Kalie Cabral, CALL HER OFFICE AT [phone].  SEEK IMMEDIATE MEDICAL ATTENTION AND CALL THE OFFICE: [phone] IF:  You have more than a spotting of blood in your stool.   Your belly is swollen (abdominal distention).   You are nauseated or vomiting.   You have a temperature over 101F.   You have abdominal pain or discomfort that is severe or gets worse throughout the day.   Gastritis  Gastritis is an inflammation (the body's way of reacting to injury and/or infection) of the stomach. It is often caused by viral or bacterial (germ) infections. It can also be caused BY ASPIRIN, BC/GOODY POWDER'S, (IBUPROFEN) MOTRIN, OR ALEVE (NAPROXEN), chemicals (including alcohol), SPICY FOODS, and medications. This illness may be associated with generalized malaise (feeling tired, not well), UPPER ABDOMINAL STOMACH cramps, and fever. One common bacterial cause of gastritis is an organism known as H. Pylori. This can be treated with  antibiotics.    Lifestyle and home remedies to CONTROL HEARTBURN/UPPER ABDOMINAL PAIN You may eliminate or reduce the frequency of heartburn by making the following lifestyle changes:   Control your weight. Being overweight is a major risk factor for heartburn and GERD. Excess pounds put pressure on your abdomen, pushing up your stomach and causing acid to back up into your esophagus.    Eat smaller meals. 4 TO 6 MEALS A DAY. This reduces pressure on the lower esophageal  sphincter, helping to prevent the valve from opening and acid from washing back into your esophagus.    Loosen your belt. Clothes that fit tightly around your waist put pressure on your abdomen and the lower esophageal sphincter.    Eliminate heartburn triggers. Everyone has specific triggers. Common triggers such as fatty or fried foods, spicy food, tomato sauce, carbonated beverages, alcohol, chocolate, mint, garlic, onion, caffeine and nicotine may make heartburn worse.    Avoid stooping or bending. Tying your shoes is OK. Bending over for longer periods to weed your garden isn't, especially soon after eating.    Don't lie down after a meal. Wait at least three to four hours after eating before going to bed, and don't lie down right after eating.   Alternative medicine  Several home remedies exist for treating GERD, but they provide only temporary relief. They include drinking baking soda (sodium bicarbonate) added to water or drinking other fluids such as baking soda mixed with cream of tartar and water.  Although these liquids create temporary relief by neutralizing, washing away or buffering acids, eventually they aggravate the situation by adding gas and fluid to your stomach, increasing pressure and causing more acid reflux. Further, adding more sodium to your diet may increase your blood pressure and add stress to your heart, and excessive bicarbonate ingestion can alter the acid-base balance in your body.  Low-Fat Diet  BREADS, CEREALS, PASTA, RICE, DRIED PEAS, AND BEANS These products are high in carbohydrates and most are low in fat. Therefore, they can be increased in the diet as substitutes for fatty foods. They too, however, contain calories and should not be eaten in excess. Cereals can be eaten for snacks as well as for breakfast.  Include foods that contain fiber (fruits, vegetables, whole grains, and legumes). Research shows that fiber may lower blood cholesterol levels,  especially the water-soluble fiber found in fruits, vegetables, oat products, and legumes.  FRUITS AND VEGETABLES It is good to eat fruits and vegetables. Besides being sources of fiber, both are rich in vitamins and some minerals. They help you get the daily allowances of these nutrients. Fruits and vegetables can be used for snacks and desserts.  MEATS Limit lean meat, chicken, [place], and fish to no more than 6 ounces per day.  Beef, Pork, and Lamb Use lean cuts of beef, pork, and lamb. Lean cuts include:  Extra-lean ground beef.  Arm roast.  Sirloin tip.  Center-cut ham.  Round steak.  Loin chops.  Rump roast.  Tenderloin.  Trim all fat off the outside of meats before cooking. It is not necessary to severely decrease the intake of red meat, but lean choices should be made. Lean meat is rich in protein and contains a highly absorbable form of iron. Premenopausal women, in particular, should avoid reducing lean red meat because this could increase the risk for low red blood cells (iron-deficiency anemia).  Chicken and [place] These are good sources of protein. The fat of poultry can be reduced  by removing the skin and underlying fat layers before cooking. Chicken and [place] can be substituted for lean red meat in the diet. Poultry should not be fried or covered with high-fat sauces.  Fish and Shellfish Fish is a good source of protein. Shellfish contain cholesterol, but they usually are low in saturated fatty acids. The preparation of fish is important. Like chicken and [place], they should not be fried or covered with high-fat sauces.  EGGS Egg whites contain no fat or cholesterol. They can be eaten often. Try 1 to 2 egg whites instead of whole eggs in recipes or use egg substitutes that do not contain yolk.  MILK AND DAIRY PRODUCTS Use skim or 1% milk instead of 2% or whole milk. Decrease whole milk, natural, and processed cheeses. Use nonfat or low-fat (2%) cottage cheese or low-fat  cheeses made from vegetable oils. Choose nonfat or low-fat (1 to 2%) yogurt. Experiment with evaporated skim milk in recipes that call for heavy cream. Substitute low-fat yogurt or low-fat cottage cheese for sour cream in dips and salad dressings. Have at least 2 servings of low-fat dairy products, such as 2 glasses of skim (or 1%) milk each day to help get your daily calcium intake.  FATS AND OILS Reduce the total intake of fats, especially saturated fat. Butterfat, lard, and beef fats are high in saturated fat and cholesterol. These should be avoided as much as possible. Vegetable fats do not contain cholesterol, but certain vegetable fats, such as coconut oil, palm oil, and palm kernel oil are very high in saturated fats. These should be limited. These fats are often used in bakery goods, processed foods, popcorn, oils, and nondairy creamers. Vegetable shortenings and some peanut butters contain hydrogenated oils, which are also saturated fats. Read the labels on these foods and check for saturated vegetable oils.  Unsaturated vegetable oils and fats do not raise blood cholesterol. However, they should be limited because they are fats and are high in calories. Total fat should still be limited to 30% of your daily caloric intake. Desirable liquid vegetable oils are corn oil, cottonseed oil, olive oil, canola oil, safflower oil, soybean oil, and sunflower oil. Peanut oil is not as good, but small amounts are acceptable. Buy a heart-healthy tub margarine that has no partially hydrogenated oils in the ingredients. Mayonnaise and salad dressings often are made from unsaturated fats, but they should also be limited because of their high calorie and fat content. Seeds, nuts, peanut butter, olives, and avocados are high in fat, but the fat is mainly the unsaturated type. These foods should be limited mainly to avoid excess calories and fat.  OTHER EATING TIPS Snacks  Most sweets should be limited as snacks.  They tend to be rich in calories and fats, and their caloric content outweighs their nutritional value. Some good choices in snacks are graham crackers, melba toast, soda crackers, bagels (no egg), English muffins, fruits, and vegetables. These snacks are preferable to snack crackers, [place] fries, and chips. Popcorn should be air-popped or cooked in small amounts of liquid vegetable oil.  Desserts Eat fruit, low-fat yogurt, and fruit ices instead of pastries, cake, and cookies. Sherbet, angel food cake, gelatin dessert, frozen low-fat yogurt, or other frozen products that do not contain saturated fat (pure fruit juice bars, frozen ice pops) are also acceptable.   COOKING METHODS Choose those methods that use little or no fat. They include: Poaching.  Braising.  Steaming.  Grilling.  Baking.  Stir-frying.  Broiling.  Microwaving.  Foods can be cooked in a nonstick pan without added fat, or use a nonfat cooking spray in regular cookware. Limit fried foods and avoid frying in saturated fat. Add moisture to lean meats by using water, broth, cooking wines, and other nonfat or low-fat sauces along with the cooking methods mentioned above. Soups and stews should be chilled after cooking. The fat that forms on top after a few hours in the refrigerator should be skimmed off. When preparing meals, avoid using excess salt. Salt can contribute to raising blood pressure in some people.  EATING AWAY FROM HOME Order entres, potatoes, and vegetables without sauces or butter. When meat exceeds the size of a deck of cards (3 to 4 ounces), the rest can be taken home for another meal. Choose vegetable or fruit salads and ask for low-calorie salad dressings to be served on the side. Use dressings sparingly. Limit high-fat toppings, such as bacon, crumbled eggs, cheese, sunflower seeds, and olives. Ask for heart-healthy tub margarine instead of butter.

## 2016-12-04 NOTE — Op Note (Signed)
[facility] Patient Name: [name] [name] Procedure Date: 12/04/2016 7:13 AM MRN: [id number] Date of Birth: Jun 07, 1965 Attending MD: Jonette Eva , MD CSN: [id number] Age: 51 Admit Type: Outpatient Procedure:                Upper GI endoscopy WITH COLD FORCEPS BIOPSY Indications:              Epigastric abdominal pain, Dyspepsia, Diarrhea Providers:                Jonette Eva, MD, Jannett Celestine, RN, Burke Keels,                            Technician Referring MD:             Corrie Mckusick MD, MD Medicines:                Propofol per Anesthesia Complications:            No immediate complications. Estimated Blood Loss:     Estimated blood loss was minimal. Procedure:                Pre-Anesthesia Assessment:                           - Prior to the procedure, a History and Physical                            was performed, and patient medications and                            allergies were reviewed. The patient's tolerance of                            previous anesthesia was also reviewed. The risks                            and benefits of the procedure and the sedation                            options and risks were discussed with the patient.                            All questions were answered, and informed consent                            was obtained. Prior Anticoagulants: The patient has                            taken aspirin. ASA Grade Assessment: II - A patient                            with mild systemic disease. After reviewing the                            risks and benefits, the patient was deemed in  satisfactory condition to undergo the procedure.                            After obtaining informed consent, the endoscope was                            passed under direct vision. Throughout the                            procedure, the patient's blood pressure, pulse, and                            oxygen saturations were  monitored continuously. The                            EG-[id number] ([id number] scope was introduced through the                            mouth, and advanced to the second part of duodenum.                            The upper GI endoscopy was accomplished without                            difficulty. The patient tolerated the procedure                            well. Scope In: 7:41:23 AM Scope Out: 7:51:28 AM Total Procedure Duration: 0 hours 10 minutes 5 seconds  Findings:      The examined esophagus was normal.      Patchy mild inflammation characterized by congestion (edema) and       erythema was found in the gastric antrum. Biopsies were taken with a       cold forceps for Helicobacter pylori testing.      A few small sessile polyps with no stigmata of recent bleeding were       found in the gastric body. The polyp was removed with a cold biopsy       forceps. Resection and retrieval were complete.      The examined duodenum was normal. Biopsies for histology were taken with       a cold forceps for evaluation of celiac disease. Impression:               - DYSPEPSIA MOST LIKELY DUE TO GERD/GASTRITIS.                           - MILD Gastritis DUE TO ASA USE                           - A few gastric polyps.                           - DIARRHEA MOST LIKELY DUE TO BILE SALT INDUCED  DIARRHEA Moderate Sedation:      Per Anesthesia Care Recommendation:           - Await pathology results. AVOID REFLUX TRIGGERS.                            CONSIDER GES.                           - Low fat diet. CHEW TUMS WITH MEALS TID.                           - Continue present medications. CHANGE REGLAN TO                            PRN.                           - Return to my office in 3 months. NEED SCREENING                            TCS WITHIN THE NEXT 6 MOS.                           - Patient has a contact number available for                             emergencies. The signs and symptoms of potential                            delayed complications were discussed with the                            patient. Return to normal activities tomorrow.                            Written discharge instructions were provided to the                            patient. Procedure Code(s):        --- Professional ---                           [phone], Esophagogastroduodenoscopy, flexible,                            transoral; with biopsy, single or multiple Diagnosis Code(s):        --- Professional ---                           K29.70, Gastritis, unspecified, without bleeding                           K31.7, Polyp of stomach and duodenum                           R10.13, Epigastric pain  R19.7, Diarrhea, unspecified CPT copyright 2016 [employer]. All rights reserved. The codes documented in this report are preliminary and upon coder review may  be revised to meet current compliance requirements. Jonette Eva, MD Jonette Eva, MD 12/04/2016 8:02:14 AM This report has been signed electronically. Number of Addenda: 0

## 2016-12-05 ENCOUNTER — Telehealth: Payer: Self-pay

## 2016-12-05 ENCOUNTER — Encounter: Payer: Self-pay | Admitting: Gastroenterology

## 2016-12-05 MED ORDER — LIDOCAINE VISCOUS 2 % MT SOLN: OROMUCOSAL | 1 refills | Status: DC

## 2016-12-05 MED ORDER — AMITRIPTYLINE HCL 10 MG PO TABS: ORAL_TABLET | ORAL | 11 refills | Status: DC

## 2016-12-05 NOTE — Telephone Encounter (Signed)
Pt is calling because she is hurting in the top part of her stomach. I told her that [facility] did take some biopsies and that could be why she was hurting. She also said that she is still very sleepy and can not drive or work. She is wanting to know if that is normal. She stated that she has a low tolerance for medicine. She is asking for a work note since she not able to work because she can not stay awake. Please advise

## 2016-12-05 NOTE — Telephone Encounter (Signed)
Please call pt. HER stomach Bx shows gastritis.  HER SMALL BOWEL BIOPSIES ARE NORMAL. TO CONTROL HER ABDOMINAL PAIN SHE SHOULD TAKE PROTONIX BID AND AVOID GOODY POWDERS. IF SHE IS HAVING ABDOMINAL PAIN SHE CAN USE VISCOUS LIDOCAINE  TO RELIEVE HER ABDOMINAL PAIN. A RX WAS SENT TO HER PHARMACY.    TO ASSIST WITH CONTROLLING HER CHRONIC ABDOMINAL PAIN, SHE CAN USE ELAVIL 10 MG QHS FOR 7 DAYS THEN 2 MG QHS. IT MAY CAUSE CONSTIPATION, DROWSINESS, AND URINARY RETENTION. ELAVIL HAS BEEN USED FOR DEPRESSION BUT DR. Roby Spalla DOESN'T THINK SHE IS DEPRESSED. IT IS EFFECTIVE FOR PAIN CONTROL. SHE SHOULD NOT TAKE ULTRAM WHILE TAKING ELAVIL.  PROPOFOL SHOULD NOT CAUSE PROLONGED SEDATION. SHE CAN STOP BY THE OFFICE TO PICK UP A WORK EXCUSE FOR JUL 18.  CUT DOWN ON SMOKING. DO NOT DRINK CAFFEINATED COFFEE. USE REGLAN IF NEEDED FOR NAUSEA OR VOMITING. SHE CAN COMPLETE A GASTRIC EMPTYING STUDY WITHIN 7 DAYS. IT WILL ASSESS WHETHER OR NOT SHE HAS GASTROPARESIS, DX: NAUSEA/VOMITING.  CHEW ONE TUMS WITH MEALS UP TO THREE TIMES A  DAY TO PREVENT DIARRHEA.  PLEASE CALL IN ONE MONTH IF HER SYMPTOMS ARE NOT IMPROVED.   FOLLOW UP IN 3 MOS E30 ABDOMINAL PAIN/NAUSEA.  COMPLETE A COLONOSCOPY WITHIN THE NEXT 3-6 MOS.

## 2016-12-06 ENCOUNTER — Other Ambulatory Visit: Payer: Self-pay

## 2016-12-06 DIAGNOSIS — R112 Nausea with vomiting, unspecified: Secondary | ICD-10-CM

## 2016-12-06 NOTE — Telephone Encounter (Signed)
GES is set up on 12/11/16 @ 8:00 am. Pt is aware of new date and time

## 2016-12-06 NOTE — Telephone Encounter (Signed)
Tried to call with no answer . Her GES is set up for 12/10/16 @ 9:00 am. I will put a letter with her work note

## 2016-12-06 NOTE — Telephone Encounter (Signed)
PT is aware of all of the info. Ok to schedule the GES. Dr. Darrick PennaFields, pt does not work on Wednesdays and would like the work note for today. Please advise!

## 2016-12-06 NOTE — Telephone Encounter (Signed)
OV made and reminder in epic °

## 2016-12-06 NOTE — Telephone Encounter (Signed)
PLEASE CALL PT. PROPOFOL SHOULD NOT CAUSE PROLONGED SEDATION. SHE CAN STOP BY THE OFFICE TO PICK UP A WORK EXCUSE FOR JUL 19.

## 2016-12-06 NOTE — Telephone Encounter (Signed)
PT is aware and said she will send her boyfriend by to pick it up for her. Note at front desk.

## 2016-12-07 ENCOUNTER — Encounter (HOSPITAL_COMMUNITY): Payer: Self-pay | Admitting: Gastroenterology

## 2016-12-10 ENCOUNTER — Ambulatory Visit (HOSPITAL_COMMUNITY): Payer: No Typology Code available for payment source

## 2016-12-11 ENCOUNTER — Encounter (HOSPITAL_COMMUNITY): Payer: Self-pay

## 2016-12-11 ENCOUNTER — Encounter (HOSPITAL_COMMUNITY)
Admission: RE | Admit: 2016-12-11 | Discharge: 2016-12-11 | Disposition: A | Discharge status: Home / Self Care | Payer: 59 | Source: Ambulatory Visit | Attending: Gastroenterology | Admitting: Gastroenterology

## 2016-12-11 DIAGNOSIS — K219 Gastro-esophageal reflux disease without esophagitis: Secondary | ICD-10-CM | POA: Diagnosis not present

## 2016-12-11 DIAGNOSIS — R112 Nausea with vomiting, unspecified: Secondary | ICD-10-CM

## 2016-12-11 DIAGNOSIS — R109 Unspecified abdominal pain: Secondary | ICD-10-CM | POA: Diagnosis not present

## 2016-12-11 MED ORDER — TECHNETIUM TC 99M SULFUR COLLOID
2.0000 | Freq: Once | INTRAVENOUS | Status: AC | PRN
  Administered 2016-12-11: 2 via ORAL

## 2016-12-11 NOTE — Progress Notes (Signed)
PT is aware and had seen it in MY CHART.

## 2017-01-02 ENCOUNTER — Ambulatory Visit: Payer: Self-pay | Admitting: Neurology

## 2017-02-13 ENCOUNTER — Ambulatory Visit: Payer: Self-pay | Admitting: Neurology

## 2017-03-06 ENCOUNTER — Ambulatory Visit: Payer: 59 | Admitting: Gastroenterology

## 2017-04-16 ENCOUNTER — Other Ambulatory Visit: Payer: Self-pay | Admitting: Gastroenterology

## 2017-05-08 ENCOUNTER — Encounter: Payer: Self-pay | Admitting: Gastroenterology

## 2017-07-01 ENCOUNTER — Other Ambulatory Visit: Payer: Self-pay | Admitting: *Deleted

## 2017-07-01 ENCOUNTER — Encounter: Payer: Self-pay | Admitting: *Deleted

## 2017-07-01 ENCOUNTER — Telehealth: Payer: Self-pay | Admitting: *Deleted

## 2017-07-01 ENCOUNTER — Encounter: Payer: Self-pay | Admitting: Gastroenterology

## 2017-07-01 ENCOUNTER — Ambulatory Visit: Payer: 59 | Admitting: Gastroenterology

## 2017-07-01 VITALS — BP 121/74 | HR 75 | Temp 98.6°F | Ht 62.0 in | Wt 153.2 lb

## 2017-07-01 DIAGNOSIS — R197 Diarrhea, unspecified: Secondary | ICD-10-CM | POA: Diagnosis not present

## 2017-07-01 DIAGNOSIS — Z1211 Encounter for screening for malignant neoplasm of colon: Secondary | ICD-10-CM

## 2017-07-01 MED ORDER — NA SULFATE-K SULFATE-MG SULF 17.5-3.13-1.6 GM/177ML PO SOLN: 1.0000 | ORAL | 0 refills | Status: DC

## 2017-07-01 NOTE — Assessment & Plan Note (Signed)
52 year old female presenting to schedule screening colonoscopy.  Her last colonoscopy was in 2007, normal.  Patient reports chronic diarrhea.  Abdominal mild abdominal noises.  Usually stools are postprandial.  Has been going on for years.  Suspect IBS-D.  Discussed possibility of antispasmodics but she would like to wait till after colonoscopy is completed.  Lab for colonoscopy with deep sedation/propofol.  I have discussed the risks, alternatives, benefits with regards to but not limited to the risk of reaction to medication, bleeding, infection, perforation and the patient is agreeable to proceed. Written consent to be obtained.  She reports having issues with her hemorrhoids after her colonoscopy years ago by Dr. Lovell SheehanJenkins, requiring hemorrhoidectomy.She reports her "hemorrhoids were pulled out during the colonoscopy".

## 2017-07-01 NOTE — Telephone Encounter (Signed)
[facility] was approved. [id number].  Patient TCS scheduled for 07/30/17 at 11:00am. Prep sent into the pharmacy. Instructions mailed to pt as well.

## 2017-07-01 NOTE — Progress Notes (Signed)
CC'D TO PCP °

## 2017-07-01 NOTE — Progress Notes (Signed)
Primary Care Physician: Avis EpleyJackson, Samantha J, PA-C  Primary Gastroenterologist:  Jonette EvaSandi Fields, MD   Chief Complaint  Patient presents with  . Colonoscopy    consult    HPI: [name] [name] is a 52 y.o. female here for follow up.  Initially seen back in July for early satiety, abdominal pain, weight loss.  Limited Goody's powder use, no other NSAIDs.  CT scan in June 2018 with dilated stomach filled with ingested material, not typical of a bezoar.  EGD performed in July showed gastritis and benign fundic gland polyps. Small bowel bx negative. No H.pylori. GES was normal.  Last colonoscopy 2007 by Dr. Juanda ChanceBrodie. Weight is up 17 pounds since 11/2016.   Patient presents to schedule colonoscopy.  She tells me initially that she has no GI problems.  But towards the end of the visit she states she forgot to tell me that she has issues with diarrhea almost every day.  Her stomach makes a lot of noise.  Off and on for quite some time.  Postprandial urgency.  She does note significant stress at times in her life.  Denies melena or rectal bleeding.  PATIENT WANTS TO MAKE SURE SHE DOES NOT HAVE ISSUES LIKE SHE DID AFTER HER COLONOSCOPY WITH DR. Lovell SheehanJENKINS. "HE PULLED MY HEMORRHOIDS OUT" AND HAD TO HAVE HEMORRHOIDECTOMY.      Current Outpatient Medications  Medication Sig Dispense Refill  . ADDERALL XR 30 MG 24 hr capsule Take 30 mg by mouth daily as needed. For focusing while at work    . ALPRAZolam (XANAX) 1 MG tablet Take 1 mg by mouth 4 (four) times daily as needed. For anxiety.  5  . Aspirin-Acetaminophen (GOODY BODY PAIN) 500-325 MG PACK Take 1 packet by mouth every 8 (eight) hours as needed (for headaches/pain.).    Marland Kitchen. hydrochlorothiazide (MICROZIDE) 12.5 MG capsule Take 12.5 mg by mouth daily as needed. For fluid retention/swelling.  6  . KLOR-CON M10 10 MEQ tablet Take 10 mEq by mouth every other day.  0  . pantoprazole (PROTONIX) 40 MG tablet TAKE 1 TABLET (40 MG TOTAL) BY MOUTH 2 (TWO)  TIMES DAILY BEFORE A MEAL. 60 tablet 5   No current facility-administered medications for this visit.     Allergies as of 07/01/2017 - Review Complete 07/01/2017  Allergen Reaction Noted  . Sulfa antibiotics Hives and Nausea And Vomiting 03/06/2011   Past Medical History:  Diagnosis Date  . Anxiety   . Bradycardia 03/02/2015  . Depression   . GERD (gastroesophageal reflux disease)   . History of hysterectomy   . History of oophorectomy   . Hx of cholecystectomy   . Near syncope 03/02/2015  . Sciatica neuralgia, right   . Tobacco abuse 03/03/2015  . Vertigo    Past Surgical History:  Procedure Laterality Date  . ABDOMINAL HYSTERECTOMY    . BIOPSY  12/04/2016   Procedure: BIOPSY;  Surgeon: West BaliFields, Sandi L, MD;  Location: [facility];  Service: Endoscopy;;  gastric and duodenal biopsy  . CESAREAN SECTION     x2  . CHOLECYSTECTOMY    . COLONOSCOPY WITH ESOPHAGOGASTRODUODENOSCOPY (EGD)  2007   Dr. Juanda ChanceBrodie: normal  . ESOPHAGOGASTRODUODENOSCOPY (EGD) WITH PROPOFOL N/A 12/04/2016   Dr. Darrick PennaFields: Gastritis but no H.pylori, small bowel bx negative. gastric polyps benign fundic gland  . HEMORRHOID SURGERY     Family History  Problem Relation Age of Onset  . ALS Mother   . Cardiomyopathy Father   .  Colon cancer Neg Hx    Social History   Tobacco Use  . Smoking status: Current Every Day Smoker    Packs/day: 0.10    Years: 20.00    Pack years: 2.00    Types: Cigarettes  . Smokeless tobacco: Never Used  Substance Use Topics  . Alcohol use: Yes    Alcohol/week: 0.0 oz    Comment: seldom  . Drug use: Yes    Frequency: 2.0 times per week    Types: Marijuana    Comment: marijuana maybe once a week    ROS:  General: Negative for anorexia, weight loss, fever, chills, fatigue, weakness. ENT: Negative for hoarseness, difficulty swallowing , nasal congestion. CV: Negative for chest pain, angina, palpitations, dyspnea on exertion, peripheral edema.  Respiratory: Negative for  dyspnea at rest, dyspnea on exertion, cough, sputum, wheezing.  GI: See history of present illness. GU:  Negative for dysuria, hematuria, urinary incontinence, urinary frequency, nocturnal urination.  Endo: Negative for unusual weight change.    Physical Examination:   BP 121/74   Pulse 75   Temp 98.6 F (37 C) (Oral)   Ht 5\' 2"  (1.575 m)   Wt 153 lb 3.2 oz (69.5 kg)   LMP 05/21/1997   BMI 28.02 kg/m   General: Well-nourished, well-developed in no acute distress. Well dressed. SEVERAL FLEAS NOTED ON PATIENT.  Eyes: No icterus. Mouth: Oropharyngeal mucosa moist and pink , no lesions erythema or exudate. Lungs: Clear to auscultation bilaterally.  Heart: Regular rate and rhythm, no murmurs rubs or gallops.  Abdomen: Bowel sounds are normal, mild diffuse tenderness, nondistended, no hepatosplenomegaly or masses, no abdominal bruits or hernia , no rebound or guarding.   Extremities: No lower extremity edema. No clubbing or deformities. Neuro: Alert and oriented x 4   Skin: Warm and dry, no jaundice.   Psych: Alert and cooperative, normal mood and affect.

## 2017-07-01 NOTE — Patient Instructions (Signed)
1. Colonoscopy as scheduled. See separate instructions.  

## 2017-07-01 NOTE — Telephone Encounter (Signed)
Pre-op scheduled for 07/23/17 Tuesday at 11:00am. Letter mailed. Pt aware

## 2017-07-22 NOTE — Patient Instructions (Signed)
[name] [name]  07/22/2017     @PREFPERIOPPHARMACY @   Your procedure is scheduled on  07/30/2017 .  Report to [facility] at  900   A.M.  Call this number if you have problems the morning of surgery:  [phone]   Remember:  Do not eat food or drink liquids after midnight.  Take these medicines the morning of surgery with A SIP OF WATER adderall, xanax, microzide, protonix.   Do not wear jewelry, make-up or nail polish.  Do not wear lotions, powders, or perfumes, or deodorant.  Do not shave 48 hours prior to surgery.  Men may shave face and neck.  Do not bring valuables to the hospital.  [facility] not [facility] for any belongings or valuables.  Contacts, dentures or bridgework may not be worn into surgery.  Leave your suitcase in the car.  After surgery it may be brought to your room.  For patients admitted to the hospital, discharge time will be determined by your treatment team.  Patients discharged the day of surgery will not be allowed to drive home.   Name and phone number of your driver:   family Special instructions:  Follow the diet and prep instructions given to you by Dr Evelina DunField's office.  Please read over the following fact sheets that you were given. Anesthesia Post-op Instructions and Care and Recovery After Surgery       Colonoscopy, Adult A colonoscopy is an exam to look at the large intestine. It is done to check for problems, such as:  Lumps (tumors).  Growths (polyps).  Swelling (inflammation).  Bleeding.  What happens before the procedure? Eating and drinking Follow instructions from your doctor about eating and drinking. These instructions may include:  A few days before the procedure - follow a low-fiber diet. ? Avoid nuts. ? Avoid seeds. ? Avoid dried fruit. ? Avoid raw fruits. ? Avoid vegetables.  1-3 days before the procedure - follow a clear liquid diet. Avoid liquids that have red or purple dye. Drink only  clear liquids, such as: ? Clear broth or bouillon. ? Black coffee or tea. ? Clear juice. ? Clear soft drinks or sports drinks. ? Gelatin dessert. ? Popsicles.  On the day of the procedure - do not eat or drink anything during the 2 hours before the procedure.  Bowel prep If you were prescribed an oral bowel prep:  Take it as told by your doctor. Starting the day before your procedure, you will need to drink a lot of liquid. The liquid will cause you to poop (have bowel movements) until your poop is almost clear or light green.  If your skin or butt gets irritated from diarrhea, you may: ? Wipe the area with wipes that have medicine in them, such as adult wet wipes with aloe and vitamin E. ? Put something on your skin that soothes the area, such as petroleum jelly.  If you throw up (vomit) while drinking the bowel prep, take a break for up to 60 minutes. Then begin the bowel prep again. If you keep throwing up and you cannot take the bowel prep without throwing up, call your doctor.  General instructions  Ask your doctor about changing or stopping your normal medicines. This is important if you take diabetes medicines or blood thinners.  Plan to have someone take you home from the hospital or clinic. What happens during the procedure?  An IV tube  may be put into one of your veins.  You will be given medicine to help you relax (sedative).  To reduce your risk of infection: ? Your doctors will wash their hands. ? Your anal area will be washed with soap.  You will be asked to lie on your side with your knees bent.  Your doctor will get a long, thin, flexible tube ready. The tube will have a camera and a light on the end.  The tube will be put into your anus.  The tube will be gently put into your large intestine.  Air will be delivered into your large intestine to keep it open. You may feel some pressure or cramping.  The camera will be used to take photos.  A small tissue  sample may be removed from your body to be looked at under a microscope (biopsy). If any possible problems are found, the tissue will be sent to a lab for testing.  If small growths are found, your doctor may remove them and have them checked for cancer.  The tube that was put into your anus will be slowly removed. The procedure may vary among doctors and hospitals. What happens after the procedure?  Your doctor will check on you often until the medicines you were given have worn off.  Do not drive for 24 hours after the procedure.  You may have a small amount of blood in your poop.  You may pass gas.  You may have mild cramps or bloating in your belly (abdomen).  It is up to you to get the results of your procedure. Ask your doctor, or the department performing the procedure, when your results will be ready. This information is not intended to replace advice given to you by your health care provider. Make sure you discuss any questions you have with your health care provider. Document Released: 06/09/2010 Document Revised: 03/07/2016 Document Reviewed: 07/19/2015 Elsevier Interactive Patient Education  2017 [employer]  Colonoscopy, Adult, Care After This sheet gives you information about how to care for yourself after your procedure. Your health care provider may also give you more specific instructions. If you have problems or questions, contact your health care provider. What can I expect after the procedure? After the procedure, it is common to have:  A small amount of blood in your stool for 24 hours after the procedure.  Some gas.  Mild abdominal cramping or bloating.  Follow these instructions at home: General instructions   For the first 24 hours after the procedure: ? Do not drive or use machinery. ? Do not sign important documents. ? Do not drink alcohol. ? Do your regular daily activities at a slower pace than normal. ? Eat soft, easy-to-digest foods. ? Rest  often.  Take over-the-counter or prescription medicines only as told by your health care provider.  It is up to you to get the results of your procedure. Ask your health care provider, or the department performing the procedure, when your results will be ready. Relieving cramping and bloating  Try walking around when you have cramps or feel bloated.  Apply heat to your abdomen as told by your health care provider. Use a heat source that your health care provider recommends, such as a moist heat pack or a heating pad. ? Place a towel between your skin and the heat source. ? Leave the heat on for 20-30 minutes. ? Remove the heat if your skin turns bright red. This is especially important if  you are unable to feel pain, heat, or cold. You may have a greater risk of getting burned. Eating and drinking  Drink enough fluid to keep your urine clear or pale yellow.  Resume your normal diet as instructed by your health care provider. Avoid heavy or fried foods that are hard to digest.  Avoid drinking alcohol for as long as instructed by your health care provider. Contact a health care provider if:  You have blood in your stool 2-3 days after the procedure. Get help right away if:  You have more than a small spotting of blood in your stool.  You pass large blood clots in your stool.  Your abdomen is swollen.  You have nausea or vomiting.  You have a fever.  You have increasing abdominal pain that is not relieved with medicine. This information is not intended to replace advice given to you by your health care provider. Make sure you discuss any questions you have with your health care provider. Document Released: 12/20/2003 Document Revised: 01/30/2016 Document Reviewed: 07/19/2015 Elsevier Interactive Patient Education  2018 [place] Anesthesia is a term that refers to techniques, procedures, and medicines that help a person stay safe and comfortable  during a medical procedure. Monitored anesthesia care, or sedation, is one type of anesthesia. Your anesthesia specialist may recommend sedation if you will be having a procedure that does not require you to be unconscious, such as:  Cataract surgery.  A dental procedure.  A biopsy.  A colonoscopy.  During the procedure, you may receive a medicine to help you relax (sedative). There are three levels of sedation:  Mild sedation. At this level, you may feel awake and relaxed. You will be able to follow directions.  Moderate sedation. At this level, you will be sleepy. You may not remember the procedure.  Deep sedation. At this level, you will be asleep. You will not remember the procedure.  The more medicine you are given, the deeper your level of sedation will be. Depending on how you respond to the procedure, the anesthesia specialist may change your level of sedation or the type of anesthesia to fit your needs. An anesthesia specialist will monitor you closely during the procedure. Let your health care provider know about:  Any allergies you have.  All medicines you are taking, including vitamins, herbs, eye drops, creams, and over-the-counter medicines.  Any use of steroids (by mouth or as a cream).  Any problems you or family members have had with sedatives and anesthetic medicines.  Any blood disorders you have.  Any surgeries you have had.  Any medical conditions you have, such as sleep apnea.  Whether you are pregnant or may be pregnant.  Any use of cigarettes, alcohol, or street drugs. What are the risks? Generally, this is a safe procedure. However, problems may occur, including:  Getting too much medicine (oversedation).  Nausea.  Allergic reaction to medicines.  Trouble breathing. If this happens, a breathing tube may be used to help with breathing. It will be removed when you are awake and breathing on your own.  Heart trouble.  Lung trouble.  Before  the procedure Staying hydrated Follow instructions from your health care provider about hydration, which may include:  Up to 2 hours before the procedure - you may continue to drink clear liquids, such as water, clear fruit juice, black coffee, and plain tea.  Eating and drinking restrictions Follow instructions from your health care provider about eating and  drinking, which may include:  8 hours before the procedure - stop eating heavy meals or foods such as meat, fried foods, or fatty foods.  6 hours before the procedure - stop eating light meals or foods, such as toast or cereal.  6 hours before the procedure - stop drinking milk or drinks that contain milk.  2 hours before the procedure - stop drinking clear liquids.  Medicines Ask your health care provider about:  Changing or stopping your regular medicines. This is especially important if you are taking diabetes medicines or blood thinners.  Taking medicines such as aspirin and ibuprofen. These medicines can thin your blood. Do not take these medicines before your procedure if your health care provider instructs you not to.  Tests and exams  You will have a physical exam.  You may have blood tests done to show: ? How well your kidneys and liver are working. ? How well your blood can clot.  General instructions  Plan to have someone take you home from the hospital or clinic.  If you will be going home right after the procedure, plan to have someone with you for 24 hours.  What happens during the procedure?  Your blood pressure, heart rate, breathing, level of pain and overall condition will be monitored.  An IV tube will be inserted into one of your veins.  Your anesthesia specialist will give you medicines as needed to keep you comfortable during the procedure. This may mean changing the level of sedation.  The procedure will be performed. After the procedure  Your blood pressure, heart rate, breathing rate, and  blood oxygen level will be monitored until the medicines you were given have worn off.  Do not drive for 24 hours if you received a sedative.  You may: ? Feel sleepy, clumsy, or nauseous. ? Feel forgetful about what happened after the procedure. ? Have a sore throat if you had a breathing tube during the procedure. ? Vomit. This information is not intended to replace advice given to you by your health care provider. Make sure you discuss any questions you have with your health care provider. Document Released: 01/31/2005 Document Revised: 10/14/2015 Document Reviewed: 08/28/2015 Elsevier Interactive Patient Education  2018 [place], Care After These instructions provide you with information about caring for yourself after your procedure. Your health care provider may also give you more specific instructions. Your treatment has been planned according to current medical practices, but problems sometimes occur. Call your health care provider if you have any problems or questions after your procedure. What can I expect after the procedure? After your procedure, it is common to:  Feel sleepy for several hours.  Feel clumsy and have poor balance for several hours.  Feel forgetful about what happened after the procedure.  Have poor judgment for several hours.  Feel nauseous or vomit.  Have a sore throat if you had a breathing tube during the procedure.  Follow these instructions at home: For at least 24 hours after the procedure:   Do not: ? Participate in activities in which you could fall or become injured. ? Drive. ? Use heavy machinery. ? Drink alcohol. ? Take sleeping pills or medicines that cause drowsiness. ? Make important decisions or sign legal documents. ? Take care of children on your own.  Rest. Eating and drinking  Follow the diet that is recommended by your health care provider.  If you vomit, drink water, juice, or soup when you  can drink without vomiting.  Make sure you have little or no nausea before eating solid foods. General instructions  Have a responsible adult stay with you until you are awake and alert.  Take over-the-counter and prescription medicines only as told by your health care provider.  If you smoke, do not smoke without supervision.  Keep all follow-up visits as told by your health care provider. This is important. Contact a health care provider if:  You keep feeling nauseous or you keep vomiting.  You feel light-headed.  You develop a rash.  You have a fever. Get help right away if:  You have trouble breathing. This information is not intended to replace advice given to you by your health care provider. Make sure you discuss any questions you have with your health care provider. Document Released: 08/28/2015 Document Revised: 12/28/2015 Document Reviewed: 08/28/2015 Elsevier Interactive Patient Education  [employer].

## 2017-07-23 ENCOUNTER — Other Ambulatory Visit (HOSPITAL_COMMUNITY): Payer: 59

## 2017-07-24 ENCOUNTER — Other Ambulatory Visit: Payer: Self-pay

## 2017-07-24 ENCOUNTER — Encounter (HOSPITAL_COMMUNITY): Payer: Self-pay

## 2017-07-24 ENCOUNTER — Encounter (HOSPITAL_COMMUNITY)
Admission: RE | Admit: 2017-07-24 | Discharge: 2017-07-24 | Disposition: A | Discharge status: Home / Self Care | Payer: 59 | Source: Ambulatory Visit | Attending: Gastroenterology | Admitting: Gastroenterology

## 2017-07-24 ENCOUNTER — Telehealth: Payer: Self-pay | Admitting: Gastroenterology

## 2017-07-24 DIAGNOSIS — Z01812 Encounter for preprocedural laboratory examination: Secondary | ICD-10-CM | POA: Diagnosis not present

## 2017-07-24 HISTORY — DX: Cerebral infarction, unspecified: I63.9

## 2017-07-24 HISTORY — DX: Essential (primary) hypertension: I10

## 2017-07-24 HISTORY — DX: Iron deficiency anemia, unspecified: D50.9

## 2017-07-24 LAB — BASIC METABOLIC PANEL
Anion gap: 10 (ref 5–15)
BUN: 12 mg/dL (ref 6–20)
CALCIUM: 9.6 mg/dL (ref 8.9–10.3)
CO2: 26 mmol/L (ref 22–32)
Chloride: 103 mmol/L (ref 101–111)
Creatinine, Ser: 0.84 mg/dL (ref 0.44–1.00)
GFR calc Af Amer: >60 mL/min (ref >60)
GLUCOSE: 122 mg/dL — AB (ref 65–99)
Potassium: 3 mmol/L — ABNORMAL LOW (ref 3.5–5.1)
SODIUM: 139 mmol/L (ref 135–145)

## 2017-07-24 LAB — CBC WITH DIFFERENTIAL/PLATELET
BASOS ABS: 0 10*3/uL (ref 0.0–0.1)
Basophils Relative: 0 %
EOS ABS: 0.1 10*3/uL (ref 0.0–0.7)
EOS PCT: 1 %
HCT: 38.5 % (ref 36.0–46.0)
Hemoglobin: 12.7 g/dL (ref 12.0–15.0)
LYMPHS ABS: 1.9 10*3/uL (ref 0.7–4.0)
LYMPHS PCT: 37 %
MCH: 30.8 pg (ref 26.0–34.0)
MCHC: 33 g/dL (ref 30.0–36.0)
MCV: 93.4 fL (ref 78.0–100.0)
MONO ABS: 0.3 10*3/uL (ref 0.1–1.0)
Monocytes Relative: 6 %
Neutro Abs: 2.9 10*3/uL (ref 1.7–7.7)
Neutrophils Relative %: 56 %
PLATELETS: 210 10*3/uL (ref 150–400)
RBC: 4.12 MIL/uL (ref 3.87–5.11)
RDW: 12.1 % (ref 11.5–15.5)
WBC: 5.2 10*3/uL (ref 4.0–10.5)

## 2017-07-24 MED ORDER — POTASSIUM CHLORIDE ER 20 MEQ PO TBCR
EXTENDED_RELEASE_TABLET | ORAL | 0 refills | Status: DC
Start: 2017-07-24 — End: 2019-06-03

## 2017-07-24 NOTE — Progress Notes (Signed)
Called the number [phone] and left the message on VM. Tried to call cell @ [phone] and the line was busy.

## 2017-07-24 NOTE — Telephone Encounter (Signed)
RX SENT FOR KCL. 

## 2017-07-25 NOTE — Pre-Procedure Instructions (Signed)
Contacted patient about low potassium and instructed her to pick up potassium Rx from drugstore. She verbalized understanding of this.

## 2017-07-30 ENCOUNTER — Encounter (HOSPITAL_COMMUNITY): Payer: Self-pay | Admitting: Gastroenterology

## 2017-07-30 ENCOUNTER — Ambulatory Visit (HOSPITAL_COMMUNITY): Payer: 59 | Admitting: Anesthesiology

## 2017-07-30 ENCOUNTER — Ambulatory Visit (HOSPITAL_COMMUNITY)
Admission: RE | Admit: 2017-07-30 | Discharge: 2017-07-30 | Disposition: A | Discharge status: Home / Self Care | Payer: 59 | Source: Ambulatory Visit | Attending: Gastroenterology | Admitting: Gastroenterology

## 2017-07-30 ENCOUNTER — Encounter (HOSPITAL_COMMUNITY)
Admission: RE | Disposition: A | Discharge status: Home / Self Care | Payer: Self-pay | Source: Ambulatory Visit | Attending: Gastroenterology

## 2017-07-30 DIAGNOSIS — Z1211 Encounter for screening for malignant neoplasm of colon: Secondary | ICD-10-CM | POA: Diagnosis not present

## 2017-07-30 DIAGNOSIS — F329 Major depressive disorder, single episode, unspecified: Secondary | ICD-10-CM | POA: Diagnosis not present

## 2017-07-30 DIAGNOSIS — F419 Anxiety disorder, unspecified: Secondary | ICD-10-CM | POA: Insufficient documentation

## 2017-07-30 DIAGNOSIS — Q438 Other specified congenital malformations of intestine: Secondary | ICD-10-CM | POA: Diagnosis not present

## 2017-07-30 DIAGNOSIS — K644 Residual hemorrhoidal skin tags: Secondary | ICD-10-CM | POA: Diagnosis not present

## 2017-07-30 DIAGNOSIS — F17210 Nicotine dependence, cigarettes, uncomplicated: Secondary | ICD-10-CM | POA: Insufficient documentation

## 2017-07-30 DIAGNOSIS — Z79899 Other long term (current) drug therapy: Secondary | ICD-10-CM | POA: Insufficient documentation

## 2017-07-30 DIAGNOSIS — I10 Essential (primary) hypertension: Secondary | ICD-10-CM | POA: Insufficient documentation

## 2017-07-30 DIAGNOSIS — Z882 Allergy status to sulfonamides status: Secondary | ICD-10-CM | POA: Diagnosis not present

## 2017-07-30 DIAGNOSIS — K648 Other hemorrhoids: Secondary | ICD-10-CM | POA: Diagnosis not present

## 2017-07-30 DIAGNOSIS — Z8673 Personal history of transient ischemic attack (TIA), and cerebral infarction without residual deficits: Secondary | ICD-10-CM | POA: Insufficient documentation

## 2017-07-30 DIAGNOSIS — R197 Diarrhea, unspecified: Secondary | ICD-10-CM | POA: Insufficient documentation

## 2017-07-30 DIAGNOSIS — K219 Gastro-esophageal reflux disease without esophagitis: Secondary | ICD-10-CM | POA: Diagnosis not present

## 2017-07-30 HISTORY — PX: COLONOSCOPY WITH PROPOFOL: SHX5780

## 2017-07-30 HISTORY — PX: BIOPSY: SHX5522

## 2017-07-30 SURGERY — COLONOSCOPY WITH PROPOFOL
Anesthesia: Monitor Anesthesia Care

## 2017-07-30 MED ORDER — PROPOFOL 10 MG/ML IV BOLUS
INTRAVENOUS | Status: DC | PRN
  Administered 2017-07-30: 50 mg via INTRAVENOUS

## 2017-07-30 MED ORDER — FENTANYL CITRATE (PF) 100 MCG/2ML IJ SOLN
25.0000 ug | Freq: Once | INTRAMUSCULAR | Status: AC
  Administered 2017-07-30: 25 ug via INTRAVENOUS

## 2017-07-30 MED ORDER — EPHEDRINE SULFATE 50 MG/ML IJ SOLN
INTRAMUSCULAR | Status: DC | PRN
  Administered 2017-07-30: 10 mg via INTRAVENOUS
  Administered 2017-07-30: 5 mg via INTRAVENOUS

## 2017-07-30 MED ORDER — LACTATED RINGERS IV SOLN
INTRAVENOUS | Status: DC
  Administered 2017-07-30: 1000 mL via INTRAVENOUS

## 2017-07-30 MED ORDER — MIDAZOLAM HCL 2 MG/2ML IJ SOLN
1.0000 mg | INTRAMUSCULAR | Status: AC
  Administered 2017-07-30: 2 mg via INTRAVENOUS

## 2017-07-30 MED ORDER — EPHEDRINE SULFATE 50 MG/ML IJ SOLN
INTRAMUSCULAR | Status: AC
  Filled 2017-07-30: qty 1

## 2017-07-30 MED ORDER — SODIUM CHLORIDE 0.9% FLUSH
INTRAVENOUS | Status: AC
  Filled 2017-07-30: qty 10

## 2017-07-30 MED ORDER — STERILE WATER FOR IRRIGATION IR SOLN
Status: DC | PRN
  Administered 2017-07-30: 100 mL

## 2017-07-30 MED ORDER — PROPOFOL 500 MG/50ML IV EMUL
INTRAVENOUS | Status: DC | PRN
  Administered 2017-07-30: 100 ug/kg/min via INTRAVENOUS

## 2017-07-30 MED ORDER — FENTANYL CITRATE (PF) 100 MCG/2ML IJ SOLN
INTRAMUSCULAR | Status: AC
  Filled 2017-07-30: qty 2

## 2017-07-30 MED ORDER — MIDAZOLAM HCL 2 MG/2ML IJ SOLN
INTRAMUSCULAR | Status: AC
  Filled 2017-07-30: qty 2

## 2017-07-30 NOTE — Progress Notes (Signed)
Called pt and she received the message and took the potassium. She is at the hospital for the procedure now.

## 2017-07-30 NOTE — Transfer of Care (Signed)
Immediate Anesthesia Transfer of Care Note  Patient: [name] [name]  Procedure(s) Performed: COLONOSCOPY WITH PROPOFOL (N/A ) BIOPSY  Patient Location: PACU  Anesthesia Type:MAC  Level of Consciousness: awake, alert , oriented and patient cooperative  Airway & Oxygen Therapy: Patient Spontanous Breathing  Post-op Assessment: Report given to RN and Post -op Vital signs reviewed and stable  Post vital signs: Reviewed and stable  Last Vitals:  Vitals:   07/30/17 0950 07/30/17 0955  BP: 115/70 123/74  Pulse:    Resp: 15 19  Temp:    SpO2: 100% 100%    Last Pain: There were no vitals filed for this visit.       Complications: No apparent anesthesia complications

## 2017-07-30 NOTE — Anesthesia Preprocedure Evaluation (Signed)
Anesthesia Evaluation  Patient identified by MRN, date of birth, ID band Patient awake    Reviewed: Allergy & Precautions, NPO status , Patient's Chart, lab work & pertinent test results  Airway Mallampati: II  TM Distance: >3 FB     Dental  (+) Teeth Intact   Pulmonary Current Smoker,    breath sounds clear to auscultation       Cardiovascular hypertension, Pt. on medications + dysrhythmias  Rhythm:Regular Rate:Normal  Near syncope hx. Hx bradycardia    Neuro/Psych  Headaches, PSYCHIATRIC DISORDERS Anxiety Depression  Neuromuscular disease CVA    GI/Hepatic GERD  Medicated,  Endo/Other    Renal/GU      Musculoskeletal   Abdominal   Peds  Hematology   Anesthesia Other Findings   Reproductive/Obstetrics                             Anesthesia Physical Anesthesia Plan  ASA: III  Anesthesia Plan: MAC   Post-op Pain Management:    Induction: Intravenous  PONV Risk Score and Plan:   Airway Management Planned: Simple Face Mask  Additional Equipment:   Intra-op Plan:   Post-operative Plan:   Informed Consent: I have reviewed the patients History and Physical, chart, labs and discussed the procedure including the risks, benefits and alternatives for the proposed anesthesia with the patient or authorized representative who has indicated his/her understanding and acceptance.     Plan Discussed with:   Anesthesia Plan Comments:         Anesthesia Quick Evaluation

## 2017-07-30 NOTE — H&P (Signed)
Primary Care Physician:  Scherrie Bateman Primary Gastroenterologist:  Dr. Oneida Alar  Pre-Procedure History & Physical: HPI:  [name] [name] is a 52 y.o. female here for  DIARRHEA.  Past Medical History:  Diagnosis Date  . Anxiety   . Bradycardia 03/02/2015  . Depression   . GERD (gastroesophageal reflux disease)   . History of hysterectomy   . History of oophorectomy   . Hx of cholecystectomy   . Hypertension   . IDA (iron deficiency anemia)   . Near syncope 03/02/2015  . Sciatica neuralgia, right   . Stroke [facility])    "light stroke 08/2015. No deficits  . Tobacco abuse 03/03/2015  . Vertigo     Past Surgical History:  Procedure Laterality Date  . ABDOMINAL HYSTERECTOMY    . BIOPSY  12/04/2016   Procedure: BIOPSY;  Surgeon: Danie Binder, MD;  Location: [facility];  Service: Endoscopy;;  gastric and duodenal biopsy  . CESAREAN SECTION     x2  . CHOLECYSTECTOMY    . COLONOSCOPY WITH ESOPHAGOGASTRODUODENOSCOPY (EGD)  2007   Dr. Olevia Perches: normal  . ESOPHAGOGASTRODUODENOSCOPY (EGD) WITH PROPOFOL N/A 12/04/2016   Dr. Oneida Alar: Gastritis but no H.pylori, small bowel bx negative. gastric polyps benign fundic gland  . HEMORRHOID SURGERY      Prior to Admission medications   Medication Sig Start Date End Date Taking? Authorizing Provider  ADDERALL XR 30 MG 24 hr capsule Take 30 mg by mouth daily as needed. For focusing while at work 11/03/16  Yes [provider]  ALPRAZolam Duanne Moron) 0.5 MG tablet Take 0.5 mg by mouth 2 (two) times daily as needed for anxiety.   Yes [provider]  Aspirin-Acetaminophen-Caffeine (GOODY HEADACHE PO) Take 1 packet by mouth daily as needed (pain).   Yes [provider]  hydrochlorothiazide (MICROZIDE) 12.5 MG capsule Take 12.5 mg by mouth daily. For fluid retention/swelling. 11/13/16  Yes [provider]  Na Sulfate-K Sulfate-Mg Sulf 17.5-3.13-1.6 GM/177ML SOLN Take 1 kit by mouth as directed. 07/01/17  Yes  Fields, Sandi L, MD  pantoprazole (PROTONIX) 40 MG tablet TAKE 1 TABLET (40 MG TOTAL) BY MOUTH 2 (TWO) TIMES DAILY BEFORE A MEAL. 04/18/17  Yes Mahala Menghini, PA-C  Potassium Chloride ER 20 MEQ TBCR 2 PO BID FOR 3 DAYS 07/24/17  Yes Danie Binder, MD    Allergies as of 07/01/2017 - Review Complete 07/01/2017  Allergen Reaction Noted  . Sulfa antibiotics Hives and Nausea And Vomiting 03/06/2011    Family History  Problem Relation Age of Onset  . ALS Mother   . Cardiomyopathy Father   . Colon cancer Neg Hx     Social History   Socioeconomic History  . Marital status: Single    Spouse name: Not on file  . Number of children: Not on file  . Years of education: Not on file  . Highest education level: Not on file  Social Needs  . Financial resource strain: Not on file  . Food insecurity - worry: Not on file  . Food insecurity - inability: Not on file  . Transportation needs - medical: Not on file  . Transportation needs - non-medical: Not on file  Occupational History  . Not on file  Tobacco Use  . Smoking status: Current Every Day Smoker    Packs/day: 0.10    Years: 20.00    Pack years: 2.00    Types: Cigarettes  . Smokeless tobacco: Never Used  Substance and Sexual Activity  .  Alcohol use: Yes    Alcohol/week: 0.0 oz    Comment: seldom  . Drug use: Yes    Frequency: 2.0 times per week    Types: Marijuana    Comment: marijuana maybe once a week  . Sexual activity: Yes    Birth control/protection: Surgical  Other Topics Concern  . Not on file  Social History Narrative   Drinks 5-6 cups of coffee daily.    Review of Systems: See HPI, otherwise negative ROS   Physical Exam: BP 111/72   Pulse 74   Temp 98.5 F (36.9 C)   Resp (!) 23   LMP 05/21/1997   SpO2 99%  General:   Alert,  pleasant and cooperative in NAD Head:  Normocephalic and atraumatic. Neck:  Supple; Lungs:  Clear throughout to auscultation.    Heart:  Regular rate and rhythm. Abdomen:   Soft, nontender and nondistended. Normal bowel sounds, without guarding, and without rebound.   Neurologic:  Alert and  oriented x4;  grossly normal neurologically.  Impression/Plan:     Diarrhea  PLAN: TCS TODAY WITH BIOPSY DISCUSSED PROCEDURE, BENEFITS, & RISKS: < 1% chance of medication reaction, bleeding, perforation, or rupture of spleen/liver.

## 2017-07-30 NOTE — Op Note (Signed)
[facility] Patient Name: [name] [name] Procedure Date: 07/30/2017 9:38 AM MRN: [id number] Date of Birth: Jul 26, 1965 Attending MD: Jonette Eva MD, MD CSN: [id number] Age: 52 Admit Type: Outpatient Procedure:                Colonoscopy WITH COLD FORCEPS BIOPSY Indications:              Clinically significant diarrhea of unexplained                            origin:3-5 A DAY. CONSUMES DAIRY REGULARLY. HAS A                            HIGH STRESS JOB. GB REMOVED 2007. Providers:                Jonette Eva MD, MD, Edrick Kins, RN, Dyann Ruddle Referring MD:              Medicines:                Propofol per Anesthesia Complications:            No immediate complications. Estimated Blood Loss:     Estimated blood loss was minimal. Procedure:                Pre-Anesthesia Assessment:                           - Prior to the procedure, a History and Physical                            was performed, and patient medications and                            allergies were reviewed. The patient's tolerance of                            previous anesthesia was also reviewed. The risks                            and benefits of the procedure and the sedation                            options and risks were discussed with the patient.                            All questions were answered, and informed consent                            was obtained. Prior Anticoagulants: The patient has                            taken aspirin, last dose was 7 days prior to                            procedure. ASA Grade Assessment: II - A patient  with mild systemic disease. After reviewing the                            risks and benefits, the patient was deemed in                            satisfactory condition to undergo the procedure.                            After obtaining informed consent, the colonoscope                            was passed under direct vision.  Throughout the                            procedure, the patient's blood pressure, pulse, and                            oxygen saturations were monitored continuously. The                            EC-[id number] ([id number]) scope was introduced through                            the anus and advanced to the the cecum, identified                            by appendiceal orifice and ileocecal valve. The                            colonoscopy was somewhat difficult due to a                            tortuous colon. Successful completion of the                            procedure was aided by straightening and shortening                            the scope to obtain bowel loop reduction and                            COLOWRAP. The patient tolerated the procedure well.                            The quality of the bowel preparation was excellent.                            The ileocecal valve, appendiceal orifice, and                            rectum were photographed. Scope In: 10:11:21 AM Scope Out: 10:26:12 AM Scope Withdrawal Time: 0 hours 12 minutes 6 seconds  Total  Procedure Duration: 0 hours 14 minutes 51 seconds  Findings:      The recto-sigmoid colon and sigmoid colon were mildly redundant.       Biopsies for histology were taken with a cold forceps from the cecum,       ascending colon, transverse colon, descending colon and sigmoid colon       for evaluation of microscopic colitis.      External and internal hemorrhoids were found. The hemorrhoids were       moderate.      The exam was otherwise without abnormality. Impression:               - Redundant LEFT colon.                           - MODERATE External and internal hemorrhoids.                           - The examination was otherwise normal.                           - LOOSE STOOL/DIARRHEA MOST LIKLEY DUE TO LACTOSE                            INTOLERANCE/BILE SALT/STRESS. Moderate Sedation:      Per Anesthesia  Care Recommendation:           - High fiber/LOW FAT/LOW DAIRY diet.                           - Continue present medications. ADD LACTASE IF                            CONSUMING DAIRY. CHEW TUMS WITH MEALS THREE TIMES A                            DAY.                           - Await pathology results.                           - Repeat colonoscopy in 10 years for surveillance.                           - Return to my office in 6 months.                           - Patient has a contact number available for                            emergencies. The signs and symptoms of potential                            delayed complications were discussed with the                            patient. Return to  normal activities tomorrow.                            Written discharge instructions were provided to the                            patient. Procedure Code(s):        --- Professional ---                           [id number], Colonoscopy, flexible; with biopsy, single                            or multiple Diagnosis Code(s):        --- Professional ---                           K64.8, Other hemorrhoids                           R19.7, Diarrhea, unspecified                           Q43.8, Other specified congenital malformations of                            intestine CPT copyright 2016 [employer]. All rights reserved. The codes documented in this report are preliminary and upon coder review may  be revised to meet current compliance requirements. Jonette EvaSandi Reaghan Kawa, MD Jonette EvaSandi Trayvon Trumbull MD, MD 07/30/2017 10:34:46 AM This report has been signed electronically. Number of Addenda: 0

## 2017-07-30 NOTE — Discharge Instructions (Signed)
You have moderate size internal AND EXTERNAL hemorrhoids. YOU DID NOT HAVE ANY POLYPS.  I BIOPSIED YOUR COLON    DRINK WATER TO KEEP YOUR URINE LIGHT YELLOW.  TO PREVENT DIARRHEA/LOOSE:    1. FOLLOW A LOW FAT/LOW DAIRY DIET. AVOID ITEMS THAT CAUSE BLOATING. SEE INFO BELOW ON A LOW FAT/DIARY FREE DIET.    2. CHEW ONE TUMS WITH MEALS THREE TIMES A DAY  REDUCE DOSE IF IT CAUSES CONSTIPATION.    3. IF YOU CONSUME DAIRY, ADD LACTASE 3 PILLS WITH MEALS UP TO THREE TIMES A DAY.  ADD FIBER TO YOUR DIET TO REDUCE COLON CANCER RISK.  USE PREPARATION H FOUR TIMES  A DAY IF NEEDED TO RELIEVE RECTAL PAIN/PRESSURE/BLEEDING.  YOUR BIOPSY WILL BE BACK IN 7 DAYS OR YOU CAN LOOK THEM UP ON MY CHART AFTER 3 DAYS.   Next colonoscopy in 10 years.  Colonoscopy Care After Read the instructions outlined below and refer to this sheet in the next week. These discharge instructions provide you with general information on caring for yourself after you leave the hospital. While your treatment has been planned according to the most current medical practices available, unavoidable complications occasionally occur. If you have any problems or questions after discharge, call DR. Finnick Orosz, [phone].  ACTIVITY  You may resume your regular activity, but move at a slower pace for the next 24 hours.   Take frequent rest periods for the next 24 hours.   Walking will help get rid of the air and reduce the bloated feeling in your belly (abdomen).   No driving for 24 hours (because of the medicine (anesthesia) used during the test).   You may shower.   Do not sign any important legal documents or operate any machinery for 24 hours (because of the anesthesia used during the test).    NUTRITION  Drink plenty of fluids.   You may resume your normal diet as instructed by your doctor.   Begin with a light meal and progress to your normal diet. Heavy or fried foods are harder to digest and may make you feel sick to  your stomach (nauseated).   Avoid alcoholic beverages for 24 hours or as instructed.    MEDICATIONS  You may resume your normal medications.   WHAT YOU CAN EXPECT TODAY  Some feelings of bloating in the abdomen.   Passage of more gas than usual.   Spotting of blood in your stool or on the toilet paper  .  IF YOU HAD POLYPS REMOVED DURING THE COLONOSCOPY:  Eat a soft diet IF YOU HAVE NAUSEA, BLOATING, ABDOMINAL PAIN, OR VOMITING.    FINDING OUT THE RESULTS OF YOUR TEST Not all test results are available during your visit. DR. Darrick Penna WILL CALL YOU WITHIN 14 DAYS OF YOUR PROCEDUE WITH YOUR RESULTS. Do not assume everything is normal if you have not heard from DR. Eliza Grissinger, CALL HER OFFICE AT [phone].  SEEK IMMEDIATE MEDICAL ATTENTION AND CALL THE OFFICE: [phone] IF:  You have more than a spotting of blood in your stool.   Your belly is swollen (abdominal distention).   You are nauseated or vomiting.   You have a temperature over 101F.   You have abdominal pain or discomfort that is severe or gets worse throughout the day.    Lactose Free Diet Lactose is a carbohydrate that is found mainly in milk and milk products, as well as in foods with added milk or whey. Lactose must be digested by the  enzyme in order to be used by the body. Lactose intolerance occurs when there is a shortage of lactase. When your body is not able to digest lactose, you may feel sick to your stomach (nausea), bloating, cramping, gas and diarrhea.  There are many dairy products that may be tolerated better than milk by some people:  The use of cultured dairy products such as buttermilk, cottage cheese, and sweet acidophilus milk (Kefir) for lactase-deficient individuals is usually well tolerated. This is because the healthy bacteria help digest lactose.   Lactose-hydrolyzed milk (Lactaid) contains 40-90% less lactose than milk and may also be well tolerated.   SPECIAL NOTES  Lactose is a  carbohydrates. The major food source is dairy products. Reading food labels is important. Many products contain lactose even when they are not made from milk. Look for the following words: whey, milk solids, dry milk solids, nonfat dry milk powder. Typical sources of lactose other than dairy products include breads, candies, cold cuts, prepared and processed foods, and commercial sauces and gravies.   All foods must be prepared without milk, cream, or other dairy foods.   Soy milk and lactose-free supplements (LACTASE) may be used as an alternative to milk.   FOOD GROUP ALLOWED/RECOMMENDED AVOID/USE SPARINGLY  BREADS / STARCHES 4 servings or more* Breads and rolls made without milk. [place], [place], or [place] bread. Breads and rolls that contain milk. Prepared mixes such as muffins, biscuits, waffles, pancakes. Sweet rolls, donuts, [place] toast (if made with milk or lactose).  Crackers: Soda crackers, graham crackers. Any crackers prepared without lactose. Zwieback crackers, corn curls, or any that contain lactose.  Cereals: Cooked or dry cereals prepared without lactose (read labels). Cooked or dry cereals prepared with lactose (read labels). Total, Cocoa Krispies. Special K.  Potatoes / Pasta / Rice: Any prepared without milk or lactose. Popcorn. Instant potatoes, frozen [place] fries, scalloped or au gratin potatoes.  VEGETABLES 2 servings or more Fresh, frozen, and canned vegetables. Creamed or breaded vegetables. Vegetables in a cheese sauce or with lactose-containing margarines.  FRUIT 2 servings or more All fresh, canned, or frozen fruits that are not processed with lactose. Any canned or frozen fruits processed with lactose.  MEAT & SUBSTITUTES 2 servings or more (4 to 6 oz. total per day) Plain beef, chicken, fish, [place], lamb, veal, pork, or ham. Kosher prepared meat products. Strained or junior meats that do not contain milk. Eggs, soy meat substitutes, nuts. Scrambled eggs, omelets,  and souffles that contain milk. Creamed or breaded meat, fish, or fowl. Sausage products such as wieners, liver sausage, or cold cuts that contain milk solids. Cheese, cottage cheese, or cheese spreads.  MILK None. (See BEVERAGES for milk substitutes. See DESSERTS for ice cream and frozen desserts.) Milk (whole, 2%, skim, or chocolate). Evaporated, powdered, or condensed milk; malted milk.  SOUPS & COMBINATION FOODS Bouillon, broth, vegetable soups, clear soups, consomms. Homemade soups made with allowed ingredients. Combination or prepared foods that do not contain milk or milk products (read labels). Cream soups, chowders, commercially prepared soups containing lactose. Macaroni and cheese, pizza. Combination or prepared foods that contain milk or milk products.  DESSERTS & SWEETS In moderation Water and fruit ices; gelatin; angel food cake. Homemade cookies, pies, or cakes made from allowed ingredients. Pudding (if made with water or a milk substitute). Lactose-free tofu desserts. Sugar, honey, corn syrup, jam, jelly; marmalade; molasses (beet sugar); Pure sugar candy; marshmallows. Ice cream, ice milk, sherbet, custard, pudding, frozen yogurt. Commercial cake and  cookie mixes. Desserts that contain chocolate. Pie crust made with milk-containing margarine; reduced-calorie desserts made with a sugar substitute that contains lactose. Toffee, peppermint, butterscotch, chocolate, caramels.  FATS & OILS In moderation Butter (as tolerated; contains very small amounts of lactose). Margarines and dressings that do not contain milk, Vegetable oils, shortening, Miracle Whip, mayonnaise, nondairy cream & whipped toppings without lactose or milk solids added (examples: Coffee Rich, Carnation Coffeemate, Rich's Whipped Topping, PolyRich). Tomasa Blase. Margarines and salad dressings containing milk; cream, cream cheese; peanut butter with added milk solids, sour cream, chip dips, made with sour cream.  BEVERAGES  Carbonated drinks; tea; coffee and freeze-dried coffee; some instant coffees (check labels). Fruit drinks; fruit and vegetable juice; Rice or Soy milk. Ovaltine, hot chocolate. Some cocoas; some instant coffees; instant iced teas; powdered fruit drinks (read labels).   CONDIMENTS / MISCELLANEOUS Soy sauce, carob powder, olives, gravy made with water, baker's cocoa, pickles, pure seasonings and spices, wine, pure monosodium glutamate, catsup, mustard. Some chewing gums, chocolate, some cocoas. Certain antibiotics and vitamin / mineral preparations. Spice blends if they contain milk products. MSG extender. Artificial sweeteners that contain lactose such as Equal (Nutra-Sweet) and Sweet 'n Low. Some nondairy creamers (read labels).   SAMPLE MENU*  Breakfast   Orange Juice.  Banana.   Bran flakes.   Nondairy Creamer.  Vienna Bread (toasted).   Butter or milk-free margarine.   Coffee or tea.    Noon Meal   Chicken Breast.  Rice.   Green beans.   Butter or milk-free margarine.  Fresh melon.   Coffee or tea.    Evening Meal   Roast Beef.  Baked potato.   Butter or milk-free margarine.   Broccoli.   Lettuce salad with vinegar and oil dressing.  [employer].   Coffee or tea.      Low-Fat Diet BREADS, CEREALS, PASTA, RICE, DRIED PEAS, AND BEANS These products are high in carbohydrates and most are low in fat. Therefore, they can be increased in the diet as substitutes for fatty foods. They too, however, contain calories and should not be eaten in excess. Cereals can be eaten for snacks as well as for breakfast.  Include foods that contain fiber (fruits, vegetables, whole grains, and legumes). Research shows that fiber may lower blood cholesterol levels, especially the water-soluble fiber found in fruits, vegetables, oat products, and legumes. FRUITS AND VEGETABLES It is good to eat fruits and vegetables. Besides being sources of fiber, both are rich in vitamins  and some minerals. They help you get the daily allowances of these nutrients. Fruits and vegetables can be used for snacks and desserts. MEATS Limit lean meat, chicken, [place], and fish to no more than 6 ounces per day. Beef, Pork, and Lamb Use lean cuts of beef, pork, and lamb. Lean cuts include:  Extra-lean ground beef.  Arm roast.  Sirloin tip.  Center-cut ham.  Round steak.  Loin chops.  Rump roast.  Tenderloin.  Trim all fat off the outside of meats before cooking. It is not necessary to severely decrease the intake of red meat, but lean choices should be made. Lean meat is rich in protein and contains a highly absorbable form of iron. Premenopausal women, in particular, should avoid reducing lean red meat because this could increase the risk for low red blood cells (iron-deficiency anemia).  Chicken and [place] These are good sources of protein. The fat of poultry can be reduced by removing the skin and underlying fat layers before cooking.  Chicken and [place] can be substituted for lean red meat in the diet. Poultry should not be fried or covered with high-fat sauces. Fish and Shellfish Fish is a good source of protein. Shellfish contain cholesterol, but they usually are low in saturated fatty acids. The preparation of fish is important. Like chicken and [place], they should not be fried or covered with high-fat sauces. EGGS Egg whites contain no fat or cholesterol. They can be eaten often. Try 1 to 2 egg whites instead of whole eggs in recipes or use egg substitutes that do not contain yolk.  MILK AND DAIRY PRODUCTS Use skim or 1% milk instead of 2% or whole milk. Decrease whole milk, natural, and processed cheeses. Use nonfat or low-fat (2%) cottage cheese or low-fat cheeses made from vegetable oils. Choose nonfat or low-fat (1 to 2%) yogurt. Experiment with evaporated skim milk in recipes that call for heavy cream. Substitute low-fat yogurt or low-fat cottage cheese for sour cream in  dips and salad dressings. Have at least 2 servings of low-fat dairy products, such as 2 glasses of skim (or 1%) milk each day to help get your daily calcium intake.  FATS AND OILS Butterfat, lard, and beef fats are high in saturated fat and cholesterol. These should be avoided.Vegetable fats do not contain cholesterol. AVOID coconut oil, palm oil, and palm kernel oil, WHICH are very high in saturated fats. These should be limited. These fats are often used in bakery goods, processed foods, popcorn, oils, and nondairy creamers. Vegetable shortenings and some peanut butters contain hydrogenated oils, which are also saturated fats. Read the labels on these foods and check for saturated vegetable oils.  Desirable liquid vegetable oils are corn oil, cottonseed oil, olive oil, canola oil, safflower oil, soybean oil, and sunflower oil. Peanut oil is not as good, but small amounts are acceptable. Buy a heart-healthy tub margarine that has no partially hydrogenated oils in the ingredients. AVOID Mayonnaise and salad dressings often are made from unsaturated fats.  OTHER EATING TIPS Snacks  Most sweets should be limited as snacks. They tend to be rich in calories and fats, and their caloric content outweighs their nutritional value. Some good choices in snacks are graham crackers, melba toast, soda crackers, bagels (no egg), English muffins, fruits, and vegetables. These snacks are preferable to snack crackers, [place] fries, and chips. Popcorn should be air-popped or cooked in small amounts of liquid vegetable oil.  Desserts Eat fruit, low-fat yogurt, and fruit ices instead of pastries, cake, and cookies. Sherbet, angel food cake, gelatin dessert, frozen low-fat yogurt, or other frozen products that do not contain saturated fat (pure fruit juice bars, frozen ice pops) are also acceptable.   COOKING METHODS Choose those methods that use little or no fat. They include: Poaching.  Braising.  Steaming.    Grilling.  Baking.  Stir-frying.  Broiling.  Microwaving.  Foods can be cooked in a nonstick pan without added fat, or use a nonfat cooking spray in regular cookware. Limit fried foods and avoid frying in saturated fat. Add moisture to lean meats by using water, broth, cooking wines, and other nonfat or low-fat sauces along with the cooking methods mentioned above. Soups and stews should be chilled after cooking. The fat that forms on top after a few hours in the refrigerator should be skimmed off. When preparing meals, avoid using excess salt. Salt can contribute to raising blood pressure in some people.  EATING AWAY FROM HOME Order entres, potatoes, and vegetables without sauces  or butter. When meat exceeds the size of a deck of cards (3 to 4 ounces), the rest can be taken home for another meal. Choose vegetable or fruit salads and ask for low-calorie salad dressings to be served on the side. Use dressings sparingly. Limit high-fat toppings, such as bacon, crumbled eggs, cheese, sunflower seeds, and olives. Ask for heart-healthy tub margarine instead of butter.   High-Fiber Diet A high-fiber diet changes your normal diet to include more whole grains, legumes, fruits, and vegetables. Changes in the diet involve replacing refined carbohydrates with unrefined foods. The calorie level of the diet is essentially unchanged. The Dietary Reference Intake (recommended amount) for adult males is 38 grams per day. For adult females, it is 25 grams per day. Pregnant and lactating women should consume 28 grams of fiber per day. Fiber is the intact part of a plant that is not broken down during digestion. Functional fiber is fiber that has been isolated from the plant to provide a beneficial effect in the body. PURPOSE  Increase stool bulk.   Ease and regulate bowel movements.   Lower cholesterol.   REDUCE RISK OF COLON CANCER  INDICATIONS THAT YOU NEED MORE FIBER  Constipation and hemorrhoids.    Uncomplicated diverticulosis (intestine condition) and irritable bowel syndrome.   Weight management.   As a protective measure against hardening of the arteries (atherosclerosis), diabetes, and cancer.   GUIDELINES FOR INCREASING FIBER IN THE DIET  Start adding fiber to the diet slowly. A gradual increase of about 5 more grams (2 slices of whole-wheat bread, 2 servings of most fruits or vegetables, or 1 bowl of high-fiber cereal) per day is best. Too rapid an increase in fiber may result in constipation, flatulence, and bloating.   Drink enough water and fluids to keep your urine clear or pale yellow. Water, juice, or caffeine-free drinks are recommended. Not drinking enough fluid may cause constipation.   Eat a variety of high-fiber foods rather than one type of fiber.   Try to increase your intake of fiber through using high-fiber foods rather than fiber pills or supplements that contain small amounts of fiber.   The goal is to change the types of food eaten. Do not supplement your present diet with high-fiber foods, but replace foods in your present diet.    INCLUDE A VARIETY OF FIBER SOURCES  Replace refined and processed grains with whole grains, canned fruits with fresh fruits, and incorporate other fiber sources. White rice, white breads, and most bakery goods contain little or no fiber.   Brown whole-grain rice, buckwheat oats, and many fruits and vegetables are all good sources of fiber. These include: broccoli, Brussels sprouts, cabbage, cauliflower, beets, sweet potatoes, white potatoes (skin on), carrots, tomatoes, eggplant, squash, berries, fresh fruits, and dried fruits.   Cereals appear to be the richest source of fiber. Cereal fiber is found in whole grains and bran. Bran is the fiber-rich outer coat of cereal grain, which is largely removed in refining. In whole-grain cereals, the bran remains. In breakfast cereals, the largest amount of fiber is found in those with  "bran" in their names. The fiber content is sometimes indicated on the label.   You may need to include additional fruits and vegetables each day.   In baking, for 1 cup white flour, you may use the following substitutions:   1 cup whole-wheat flour minus 2 tablespoons.   1/2 cup white flour plus 1/2 cup whole-wheat flour.   Hemorrhoids Hemorrhoids are dilated (  enlarged) veins around the rectum. Sometimes clots will form in the veins. This makes them swollen and painful. These are called thrombosed hemorrhoids. Causes of hemorrhoids include:  Constipation.   Straining to have a bowel movement.   HEAVY LIFTING  HOME CARE INSTRUCTIONS  Eat a well balanced diet and drink 6 to 8 glasses of water every day to avoid constipation. You may also use a bulk laxative.   Avoid straining to have bowel movements.   Keep anal area dry and clean.   Do not use a donut shaped pillow or sit on the toilet for long periods. This increases blood pooling and pain.   Move your bowels when your body has the urge; this will require less straining and will decrease pain and pressure.

## 2017-07-30 NOTE — Anesthesia Postprocedure Evaluation (Signed)
Anesthesia Post Note  Patient: [name] [name]  Procedure(s) Performed: COLONOSCOPY WITH PROPOFOL (N/A ) BIOPSY  Patient location during evaluation: PACU Anesthesia Type: MAC Level of consciousness: awake and alert, oriented and patient cooperative Pain management: satisfactory to patient Vital Signs Assessment: post-procedure vital signs reviewed and stable Respiratory status: spontaneous breathing Cardiovascular status: stable Postop Assessment: no apparent nausea or vomiting Anesthetic complications: no     Last Vitals:  Vitals:   07/30/17 0955 07/30/17 1030  BP: 123/74 (!) 94/54  Pulse:  85  Resp: 19 14  Temp:  36.6 C  SpO2: 100% 97%    Last Pain: There were no vitals filed for this visit.               Verlin Grills

## 2017-08-05 ENCOUNTER — Encounter (HOSPITAL_COMMUNITY): Payer: Self-pay | Admitting: Gastroenterology

## 2017-08-05 NOTE — Progress Notes (Signed)
ON RECALL  °

## 2017-08-05 NOTE — Progress Notes (Signed)
cc'd to pcp 

## 2017-08-05 NOTE — Progress Notes (Signed)
PT is aware.

## 2017-11-04 ENCOUNTER — Emergency Department (HOSPITAL_COMMUNITY)
Admission: EM | Admit: 2017-11-04 | Discharge: 2017-11-04 | Disposition: A | Discharge status: Home / Self Care | Payer: 59 | Attending: Emergency Medicine | Admitting: Emergency Medicine

## 2017-11-04 ENCOUNTER — Encounter (HOSPITAL_COMMUNITY): Payer: Self-pay | Admitting: Emergency Medicine

## 2017-11-04 ENCOUNTER — Emergency Department (HOSPITAL_COMMUNITY): Payer: 59

## 2017-11-04 ENCOUNTER — Other Ambulatory Visit: Payer: Self-pay

## 2017-11-04 DIAGNOSIS — Z79899 Other long term (current) drug therapy: Secondary | ICD-10-CM | POA: Insufficient documentation

## 2017-11-04 DIAGNOSIS — J069 Acute upper respiratory infection, unspecified: Secondary | ICD-10-CM

## 2017-11-04 DIAGNOSIS — Z8673 Personal history of transient ischemic attack (TIA), and cerebral infarction without residual deficits: Secondary | ICD-10-CM | POA: Insufficient documentation

## 2017-11-04 DIAGNOSIS — F329 Major depressive disorder, single episode, unspecified: Secondary | ICD-10-CM | POA: Diagnosis not present

## 2017-11-04 DIAGNOSIS — F419 Anxiety disorder, unspecified: Secondary | ICD-10-CM | POA: Diagnosis not present

## 2017-11-04 DIAGNOSIS — M791 Myalgia, unspecified site: Secondary | ICD-10-CM | POA: Diagnosis not present

## 2017-11-04 DIAGNOSIS — R0981 Nasal congestion: Secondary | ICD-10-CM | POA: Diagnosis present

## 2017-11-04 DIAGNOSIS — Z9049 Acquired absence of other specified parts of digestive tract: Secondary | ICD-10-CM | POA: Diagnosis not present

## 2017-11-04 DIAGNOSIS — I10 Essential (primary) hypertension: Secondary | ICD-10-CM | POA: Insufficient documentation

## 2017-11-04 DIAGNOSIS — R509 Fever, unspecified: Secondary | ICD-10-CM | POA: Diagnosis not present

## 2017-11-04 DIAGNOSIS — F17210 Nicotine dependence, cigarettes, uncomplicated: Secondary | ICD-10-CM | POA: Insufficient documentation

## 2017-11-04 DIAGNOSIS — R05 Cough: Secondary | ICD-10-CM | POA: Diagnosis not present

## 2017-11-04 LAB — CBC WITH DIFFERENTIAL/PLATELET
BASOS PCT: 1 %
Basophils Absolute: 0 10*3/uL (ref 0.0–0.1)
EOS ABS: 0.1 10*3/uL (ref 0.0–0.7)
EOS PCT: 2 %
HCT: 37.9 % (ref 36.0–46.0)
Hemoglobin: 12.4 g/dL (ref 12.0–15.0)
LYMPHS ABS: 2.3 10*3/uL (ref 0.7–4.0)
Lymphocytes Relative: 47 %
MCH: 31 pg (ref 26.0–34.0)
MCHC: 32.7 g/dL (ref 30.0–36.0)
MCV: 94.8 fL (ref 78.0–100.0)
MONO ABS: 0.4 10*3/uL (ref 0.1–1.0)
Monocytes Relative: 8 %
Neutro Abs: 2 10*3/uL (ref 1.7–7.7)
Neutrophils Relative %: 42 %
Platelets: 179 10*3/uL (ref 150–400)
RBC: 4 MIL/uL (ref 3.87–5.11)
RDW: 12.4 % (ref 11.5–15.5)
WBC: 4.8 10*3/uL (ref 4.0–10.5)

## 2017-11-04 LAB — URINALYSIS, ROUTINE W REFLEX MICROSCOPIC
Bilirubin Urine: NEGATIVE
Glucose, UA: NEGATIVE mg/dL
HGB URINE DIPSTICK: NEGATIVE
Ketones, ur: NEGATIVE mg/dL
NITRITE: NEGATIVE
PH: 6 (ref 5.0–8.0)
Protein, ur: NEGATIVE mg/dL
SPECIFIC GRAVITY, URINE: 1.018 (ref 1.005–1.030)

## 2017-11-04 LAB — BASIC METABOLIC PANEL
Anion gap: 6 (ref 5–15)
BUN: 15 mg/dL (ref 6–20)
CALCIUM: 9.4 mg/dL (ref 8.9–10.3)
CO2: 33 mmol/L — AB (ref 22–32)
CREATININE: 0.92 mg/dL (ref 0.44–1.00)
Chloride: 100 mmol/L — ABNORMAL LOW (ref 101–111)
GFR calc non Af Amer: >60 mL/min (ref >60)
GLUCOSE: 98 mg/dL (ref 65–99)
Potassium: 3.6 mmol/L (ref 3.5–5.1)
Sodium: 139 mmol/L (ref 135–145)

## 2017-11-04 MED ORDER — BENZONATATE 100 MG PO CAPS: 200.0000 mg | ORAL_CAPSULE | Freq: Three times a day (TID) | ORAL | 0 refills | Status: DC | PRN

## 2017-11-04 NOTE — ED Notes (Signed)
E signature pad would not work

## 2017-11-04 NOTE — Discharge Instructions (Addendum)
Rest and make sure you are drinking plenty of fluids.  Your lab tests, xray and exam tonight are reassuring that this is probably a viral illness that will run its course with rest and time.  I recommend tylenol for body aches.  You may use the medicine prescribed for cough relief. See your primary doctor if your symptoms are not improving by the end of this week.

## 2017-11-04 NOTE — ED Triage Notes (Signed)
Pt reports beginning to feel bad over the weekend with a productive cough, body aches, and generally not feeling well.

## 2017-11-04 NOTE — ED Triage Notes (Signed)
Pt also states she has swelling in her hands, but very minimal noted.

## 2017-11-04 NOTE — ED Provider Notes (Signed)
[facility] EMERGENCY DEPARTMENT Provider Note   CSN: [id number] Arrival date & time: 11/04/17  1748     History   Chief Complaint Chief Complaint  Patient presents with  . Cough    HPI [name] [name] is a 52 y.o. female presenting with 3 day history of uri type symptoms which includes nasal congestion, sore throat, subjective fever and cough which has been productive of a yellow to green sputum production along with general body aches.  Symptoms do not include shortness of breath, chest pain, nausea, vomiting or diarrhea, no rash.  She endorses mid back pain with cough and certain movements and has also noticed swelling in her hands which started yesterday.  She takes hctz for BP control in addtion to potassium supplementation and denies missing any doses.  The patient has had no medicines prior to arrival.  The history is provided by the patient.    Past Medical History:  Diagnosis Date  . Anxiety   . Bradycardia 03/02/2015  . Depression   . GERD (gastroesophageal reflux disease)   . History of hysterectomy   . History of oophorectomy   . Hx of cholecystectomy   . Hypertension   . IDA (iron deficiency anemia)   . Near syncope 03/02/2015  . Sciatica neuralgia, right   . Stroke [facility])    "light stroke 08/2015. No deficits  . Tobacco abuse 03/03/2015  . Vertigo     Patient Active Problem List   Diagnosis Date Noted  . Dyspepsia 11/24/2016  . Headache 09/07/2016  . Vision, loss, sudden, left 09/07/2016  . Left sided numbness 09/07/2016  . GERD (gastroesophageal reflux disease) 09/07/2016  . Vision loss, left eye 09/07/2016  . Snoring 04/19/2015  . Tobacco use disorder 04/19/2015  . Hypokalemia 03/03/2015  . Normocytic anemia 03/03/2015  . Tobacco abuse 03/03/2015  . Near syncope 03/02/2015  . Bradycardia 03/02/2015  . Pre-syncope 03/02/2015  . ANEMIA, IRON DEFICIENCY 08/01/2007  . RECTAL BLEEDING 08/01/2007  . DIARRHEA, CHRONIC 08/01/2007  . EPIGASTRIC PAIN  08/01/2007    Past Surgical History:  Procedure Laterality Date  . ABDOMINAL HYSTERECTOMY    . BIOPSY  12/04/2016   Procedure: BIOPSY;  Surgeon: West Bali, MD;  Location: [facility];  Service: Endoscopy;;  gastric and duodenal biopsy  . BIOPSY  07/30/2017   Procedure: BIOPSY;  Surgeon: West Bali, MD;  Location: [facility] ENDO [facility]ITE;  Service: Endoscopy;;  random colon bx's  . CESAREAN SECTION     x2  . CHOLECYSTECTOMY    . COLONOSCOPY WITH ESOPHAGOGASTRODUODENOSCOPY (EGD)  2007   Dr. Juanda Chance: normal  . COLONOSCOPY WITH PROPOFOL N/A 07/30/2017   Procedure: COLONOSCOPY WITH PROPOFOL;  Surgeon: West Bali, MD;  Location: [facility]O [facility];  Service: Endoscopy;  Laterality: N/A;  11:00am  . ESOPHAGOGASTRODUODENOSCOPY (EGD) WITH PROPOFOL N/A 12/04/2016   Dr. Darrick Penna: Gastritis but no H.pylori, small bowel bx negative. gastric polyps benign fundic gland  . HEMORRHOID SURGERY       OB History   None      Home Medications    Prior to Admission medications   Medication Sig Start Date End Date Taking? Authorizing Provider  ADDERALL XR 30 MG 24 hr capsule Take 30 mg by mouth daily as needed. For focusing while at work 11/03/16   [provider]  ALPRAZolam Prudy Feeler) 0.5 MG tablet Take 0.5 mg by mouth 2 (two) times daily as needed for anxiety.    [provider]  Aspirin-Acetaminophen-Caffeine (GOODY  HEADACHE PO) Take 1 packet by mouth daily as needed (pain).    [provider]  benzonatate (TESSALON) 100 MG capsule Take 2 capsules (200 mg total) by mouth 3 (three) times daily as needed. 11/04/17   Burgess Amor, PA-C  hydrochlorothiazide (MICROZIDE) 12.5 MG capsule Take 12.5 mg by mouth daily. For fluid retention/swelling. 11/13/16   [provider]  pantoprazole (PROTONIX) 40 MG tablet TAKE 1 TABLET (40 MG TOTAL) BY MOUTH 2 (TWO) TIMES DAILY BEFORE A MEAL. 04/18/17   Tiffany Kocher, PA-C  Potassium Chloride ER 20 MEQ TBCR 2 PO BID FOR 3 DAYS 07/24/17    West Bali, MD    Family History Family History  Problem Relation Age of Onset  . ALS Mother   . Cardiomyopathy Father   . Colon cancer Neg Hx     Social History Social History   Tobacco Use  . Smoking status: Current Every Day Smoker    Packs/day: 0.10    Years: 20.00    Pack years: 2.00    Types: Cigarettes  . Smokeless tobacco: Never Used  Substance Use Topics  . Alcohol use: Yes    Alcohol/week: 0.0 oz    Comment: seldom  . Drug use: Yes    Frequency: 2.0 times per week    Types: Marijuana    Comment: marijuana maybe once a week     Allergies   Sulfa antibiotics   Review of Systems Review of Systems  Constitutional: Positive for chills and fever.  HENT: Positive for congestion and sore throat. Negative for ear pain, rhinorrhea, sinus pressure, trouble swallowing and voice change.   Eyes: Negative for discharge.  Respiratory: Positive for cough. Negative for shortness of breath, wheezing and stridor.   Cardiovascular: Negative for chest pain.  Gastrointestinal: Negative for abdominal pain, nausea and vomiting.  Genitourinary: Negative.   Musculoskeletal: Positive for myalgias.     Physical Exam Updated Vital Signs BP 122/79 (BP Location: Right Arm)   Pulse (!) 53   Temp 97.9 F (36.6 C) (Oral)   Resp 16   Ht 5\' 2"  (1.575 m)   Wt 63.5 kg (140 lb)   LMP 05/21/1997   SpO2 100%   BMI 25.61 kg/m   Physical Exam  Constitutional: She is oriented to person, place, and time. She appears well-developed and well-nourished.  HENT:  Head: Normocephalic and atraumatic.  Right Ear: Tympanic membrane and ear canal normal.  Left Ear: Tympanic membrane and ear canal normal.  Nose: No mucosal edema or rhinorrhea.  Mouth/Throat: Uvula is midline and mucous membranes are normal. No oropharyngeal exudate, posterior oropharyngeal edema, posterior oropharyngeal erythema or tonsillar abscesses.  Buccal mucosa dry.  Eyes: Conjunctivae are normal.    Cardiovascular: Normal rate and normal heart sounds.  Pulmonary/Chest: Effort normal. No stridor. No respiratory distress. She has no wheezes. She has no rhonchi. She has no rales.  Occasional dry cough  Abdominal: Soft. There is no tenderness.  Musculoskeletal: Normal range of motion.  Neurological: She is alert and oriented to person, place, and time.  Skin: Skin is warm and dry. No rash noted.  Psychiatric: She has a normal mood and affect.     ED Treatments / Results  Labs (all labs ordered are listed, but only abnormal results are displayed) Labs Reviewed  BASIC METABOLIC PANEL - Abnormal; Notable for the following components:      Result Value   Chloride 100 (*)    CO2 33 (*)    All  other components within normal limits  URINALYSIS, ROUTINE W REFLEX MICROSCOPIC - Abnormal; Notable for the following components:   APPearance HAZY (*)    Leukocytes, UA TRACE (*)    Bacteria, UA RARE (*)    All other components within normal limits  CBC WITH DIFFERENTIAL/PLATELET    EKG None  Radiology Dg Chest 2 View  Result Date: 11/04/2017 CLINICAL DATA:  Productive cough for 4 days and fever. EXAM: CHEST - 2 VIEW COMPARISON:  03/02/2015 FINDINGS: The heart size and mediastinal contours are within normal limits. Both lungs are clear. The visualized skeletal structures are unremarkable. IMPRESSION: No active cardiopulmonary disease.  No change from priors. Electronically Signed   By: Elsie StainJohn T Curnes M.D.   On: 11/04/2017 18:32    Procedures Procedures (including critical care time)  Medications Ordered in ED Medications - No data to display   Initial Impression / Assessment and Plan / ED Course  I have reviewed the triage vital signs and the nursing notes.  Pertinent labs & imaging results that were available during my care of the patient were reviewed by me and considered in my medical decision making (see chart for details).     Labs and imaging reviewed and reassuring.   Suspect patient has a viral URI.  She has normal vital signs here, she tolerated p.o. intake. CXR negative for pneumonia. There is no findings to suggest dehydration.  Chest x-ray is clear.  She was placed on Tessalon for cough relief.  Encouraged to rest and follow-up with PCP if symptoms persist or do not resolve with symptomatic treatment. Final Clinical Impressions(s) / ED Diagnoses   Final diagnoses:  Viral upper respiratory tract infection    ED Discharge Orders        Ordered    benzonatate (TESSALON) 100 MG capsule  3 times daily PRN     11/04/17 2123       Burgess Amordol, Breniyah Romm, PA-C 11/04/17 2135    Long, Arlyss RepressJoshua G, MD 11/05/17 1045

## 2017-11-13 DIAGNOSIS — Z1231 Encounter for screening mammogram for malignant neoplasm of breast: Secondary | ICD-10-CM | POA: Diagnosis not present

## 2017-11-13 DIAGNOSIS — Z01419 Encounter for gynecological examination (general) (routine) without abnormal findings: Secondary | ICD-10-CM | POA: Diagnosis not present

## 2017-11-21 ENCOUNTER — Other Ambulatory Visit: Payer: Self-pay | Admitting: Gastroenterology

## 2017-12-26 ENCOUNTER — Encounter: Payer: Self-pay | Admitting: Gastroenterology

## 2018-07-21 ENCOUNTER — Other Ambulatory Visit: Payer: Self-pay | Admitting: Gastroenterology

## 2018-10-16 ENCOUNTER — Other Ambulatory Visit: Payer: Self-pay

## 2018-10-16 ENCOUNTER — Other Ambulatory Visit (HOSPITAL_COMMUNITY): Payer: Self-pay | Admitting: Family Medicine

## 2018-10-16 ENCOUNTER — Emergency Department (HOSPITAL_COMMUNITY): Payer: 59

## 2018-10-16 ENCOUNTER — Ambulatory Visit (HOSPITAL_COMMUNITY)
Admission: RE | Admit: 2018-10-16 | Discharge: 2018-10-16 | Disposition: A | Discharge status: Home / Self Care | Payer: 59 | Source: Ambulatory Visit | Attending: Family Medicine | Admitting: Family Medicine

## 2018-10-16 ENCOUNTER — Emergency Department (HOSPITAL_COMMUNITY)
Admission: EM | Admit: 2018-10-16 | Discharge: 2018-10-16 | Disposition: A | Discharge status: Home / Self Care | Payer: 59 | Attending: Emergency Medicine | Admitting: Emergency Medicine

## 2018-10-16 ENCOUNTER — Encounter (HOSPITAL_COMMUNITY): Payer: Self-pay | Admitting: Emergency Medicine

## 2018-10-16 DIAGNOSIS — R109 Unspecified abdominal pain: Secondary | ICD-10-CM

## 2018-10-16 DIAGNOSIS — Z79899 Other long term (current) drug therapy: Secondary | ICD-10-CM | POA: Diagnosis not present

## 2018-10-16 DIAGNOSIS — R1011 Right upper quadrant pain: Secondary | ICD-10-CM | POA: Insufficient documentation

## 2018-10-16 DIAGNOSIS — F17210 Nicotine dependence, cigarettes, uncomplicated: Secondary | ICD-10-CM | POA: Diagnosis not present

## 2018-10-16 DIAGNOSIS — I10 Essential (primary) hypertension: Secondary | ICD-10-CM | POA: Insufficient documentation

## 2018-10-16 DIAGNOSIS — R1031 Right lower quadrant pain: Secondary | ICD-10-CM | POA: Diagnosis not present

## 2018-10-16 DIAGNOSIS — E876 Hypokalemia: Secondary | ICD-10-CM | POA: Insufficient documentation

## 2018-10-16 LAB — CBC WITH DIFFERENTIAL/PLATELET
Abs Immature Granulocytes: 0.01 10*3/uL (ref 0.00–0.07)
Basophils Absolute: 0 10*3/uL (ref 0.0–0.1)
Basophils Relative: 0 %
Eosinophils Absolute: 0.1 10*3/uL (ref 0.0–0.5)
Eosinophils Relative: 2 %
HCT: 36.4 % (ref 36.0–46.0)
Hemoglobin: 12.3 g/dL (ref 12.0–15.0)
Immature Granulocytes: 0 %
Lymphocytes Relative: 42 %
Lymphs Abs: 1.9 10*3/uL (ref 0.7–4.0)
MCH: 31.9 pg (ref 26.0–34.0)
MCHC: 33.8 g/dL (ref 30.0–36.0)
MCV: 94.3 fL (ref 80.0–100.0)
Monocytes Absolute: 0.4 10*3/uL (ref 0.1–1.0)
Monocytes Relative: 10 %
Neutro Abs: 2.1 10*3/uL (ref 1.7–7.7)
Neutrophils Relative %: 46 %
Platelets: 178 10*3/uL (ref 150–400)
RBC: 3.86 MIL/uL — ABNORMAL LOW (ref 3.87–5.11)
RDW: 12 % (ref 11.5–15.5)
WBC: 4.6 10*3/uL (ref 4.0–10.5)
nRBC: 0 % (ref 0.0–0.2)

## 2018-10-16 LAB — COMPREHENSIVE METABOLIC PANEL
ALT: 11 U/L (ref 0–44)
AST: 17 U/L (ref 15–41)
Albumin: 3.7 g/dL (ref 3.5–5.0)
Alkaline Phosphatase: 76 U/L (ref 38–126)
Anion gap: 11 (ref 5–15)
BUN: 14 mg/dL (ref 6–20)
CO2: 26 mmol/L (ref 22–32)
Calcium: 9 mg/dL (ref 8.9–10.3)
Chloride: 100 mmol/L (ref 98–111)
Creatinine, Ser: 0.94 mg/dL (ref 0.44–1.00)
GFR calc Af Amer: >60 mL/min (ref >60)
GFR calc non Af Amer: >60 mL/min (ref >60)
Glucose, Bld: 89 mg/dL (ref 70–99)
Potassium: 2.8 mmol/L — ABNORMAL LOW (ref 3.5–5.1)
Sodium: 137 mmol/L (ref 135–145)
Total Bilirubin: 0.4 mg/dL (ref 0.3–1.2)
Total Protein: 7 g/dL (ref 6.5–8.1)

## 2018-10-16 LAB — TROPONIN I: Troponin I: <0.03 ng/mL (ref <0.03)

## 2018-10-16 LAB — URINALYSIS, ROUTINE W REFLEX MICROSCOPIC
Bilirubin Urine: NEGATIVE
Glucose, UA: NEGATIVE mg/dL
Hgb urine dipstick: NEGATIVE
Ketones, ur: NEGATIVE mg/dL
Leukocytes,Ua: NEGATIVE
Nitrite: NEGATIVE
Protein, ur: NEGATIVE mg/dL
Specific Gravity, Urine: 1.008 (ref 1.005–1.030)
pH: 6 (ref 5.0–8.0)

## 2018-10-16 LAB — LIPASE, BLOOD: Lipase: 30 U/L (ref 11–51)

## 2018-10-16 MED ORDER — ONDANSETRON 4 MG PO TBDP
4.0000 mg | ORAL_TABLET | Freq: Once | ORAL | Status: AC
  Administered 2018-10-16: 20:00:00 4 mg via ORAL
  Filled 2018-10-16: qty 1

## 2018-10-16 MED ORDER — MORPHINE SULFATE (PF) 4 MG/ML IV SOLN
4.0000 mg | Freq: Once | INTRAVENOUS | Status: AC
  Administered 2018-10-16: 20:00:00 4 mg via INTRAMUSCULAR
  Filled 2018-10-16: qty 1

## 2018-10-16 MED ORDER — POTASSIUM CHLORIDE CRYS ER 20 MEQ PO TBCR
40.0000 meq | EXTENDED_RELEASE_TABLET | Freq: Once | ORAL | Status: AC
  Administered 2018-10-16: 23:00:00 40 meq via ORAL
  Filled 2018-10-16: qty 2

## 2018-10-16 MED ORDER — CYCLOBENZAPRINE HCL 10 MG PO TABS: 10.0000 mg | ORAL_TABLET | Freq: Three times a day (TID) | ORAL | 0 refills | Status: DC | PRN

## 2018-10-16 MED ORDER — NAPROXEN 500 MG PO TABS: 500.0000 mg | ORAL_TABLET | Freq: Two times a day (BID) | ORAL | 0 refills | Status: DC

## 2018-10-16 MED ORDER — CYCLOBENZAPRINE HCL 10 MG PO TABS
10.0000 mg | ORAL_TABLET | Freq: Once | ORAL | Status: AC
  Administered 2018-10-16: 23:00:00 10 mg via ORAL
  Filled 2018-10-16: qty 1

## 2018-10-16 MED ORDER — POTASSIUM CHLORIDE CRYS ER 20 MEQ PO TBCR: 40.0000 meq | EXTENDED_RELEASE_TABLET | Freq: Every day | ORAL | 0 refills | Status: DC

## 2018-10-16 NOTE — Discharge Instructions (Addendum)
Your blood work this evening shows that your potassium level is low.  Take the prescription potassium as directed, you will need to have your potassium rechecked by your primary provider next week.  Call their office to arrange a follow-up appointment.  Return to ER for any worsening symptoms.

## 2018-10-16 NOTE — ED Triage Notes (Signed)
Patient complains of rt side flank pain that began six months ago and became worse in the last week. CT abdomen was complated here today and was found to be negative.

## 2018-10-16 NOTE — ED Provider Notes (Signed)
[facility] EMERGENCY DEPARTMENT Provider Note   CSN: [id number] Arrival date & time: 10/16/18  1649    History   Chief Complaint Chief Complaint  Patient presents with  . Flank Pain    HPI [name] [name] is a 53 y.o. female.     HPI   [name] [name] is a 53 y.o. female who presents to the Emergency Department complaining of persistent right flank pain.  Pain has been present for approximately 6 months, but worse for 1 week.  She describes a constant, sharp pain all along her right flank and back.  Pain extends from just below her shoulder blade down to her hip and worsens with movement, including bending over and raising her right arm.  Pain does not radiate into her abdomen and is not associated with nausea, fever, vomiting or food intake.  She was seen by her PCP earlier today and had CT of her abdomen and pelvis w/o clear origin of her pain.  She denies injury, dysuria, significant cough, chest pain or shortness of breath.  She receives an injection of Toradol earlier today that provided some relief.      Past Medical History:  Diagnosis Date  . Anxiety   . Bradycardia 03/02/2015  . Depression   . GERD (gastroesophageal reflux disease)   . History of hysterectomy   . History of oophorectomy   . Hx of cholecystectomy   . Hypertension   . IDA (iron deficiency anemia)   . Near syncope 03/02/2015  . Sciatica neuralgia, right   . Stroke [facility])    "light stroke 08/2015. No deficits  . Tobacco abuse 03/03/2015  . Vertigo     Patient Active Problem List   Diagnosis Date Noted  . Dyspepsia 11/24/2016  . Headache 09/07/2016  . Vision, loss, sudden, left 09/07/2016  . Left sided numbness 09/07/2016  . GERD (gastroesophageal reflux disease) 09/07/2016  . Vision loss, left eye 09/07/2016  . Snoring 04/19/2015  . Tobacco use disorder 04/19/2015  . Hypokalemia 03/03/2015  . Normocytic anemia 03/03/2015  . Tobacco abuse 03/03/2015  . Near syncope 03/02/2015  .  Bradycardia 03/02/2015  . Pre-syncope 03/02/2015  . ANEMIA, IRON DEFICIENCY 08/01/2007  . RECTAL BLEEDING 08/01/2007  . DIARRHEA, CHRONIC 08/01/2007  . EPIGASTRIC PAIN 08/01/2007    Past Surgical History:  Procedure Laterality Date  . ABDOMINAL HYSTERECTOMY    . BIOPSY  12/04/2016   Procedure: BIOPSY;  Surgeon: West Bali, MD;  [facility];  Service: Endoscopy;;  gastric and duodenal biopsy  . BIOPSY  07/30/2017   Procedure: BIOPSY;  Surgeon: West Bali, MD;  Location[facility];  Service: Endoscopy;;  random colon bx's  . CESAREAN SECTION     x2  . CHOLECYSTECTOMY    . COLONOSCOPY WITH ESOPHAGOGASTRODUODENOSCOPY (EGD)  2007   Dr. Juanda Chance: normal  . COLONOSCOPY WITH PROPOFOL N/A 07/30/2017   Procedure: COLONOSCOPY WITH PROPOFOL;  Surgeon: West Bali, MD;  Location: [facility];  Service: Endoscopy;  Laterality: N/A;  11:00am  . ESOPHAGOGASTRODUODENOSCOPY (EGD) WITH PROPOFOL N/A 12/04/2016   Dr. Darrick Penna: Gastritis but no H.pylori, small bowel bx negative. gastric polyps benign fundic gland  . HEMORRHOID SURGERY       OB History   No obstetric history on file.      Home Medications    Prior to Admission medications   Medication Sig Start Date End Date Taking? Authorizing Provider  ADDERALL XR 30 MG 24 hr capsule Take  30 mg by mouth daily as needed. For focusing while at work 11/03/16   [provider]  ALPRAZolam Prudy Feeler) 0.5 MG tablet Take 0.5 mg by mouth 2 (two) times daily as needed for anxiety.    [provider]  Aspirin-Acetaminophen-Caffeine (GOODY HEADACHE PO) Take 1 packet by mouth daily as needed (pain).    [provider]  benzonatate (TESSALON) 100 MG capsule Take 2 capsules (200 mg total) by mouth 3 (three) times daily as needed. 11/04/17   Burgess Amor, PA-C  hydrochlorothiazide (MICROZIDE) 12.5 MG capsule Take 12.5 mg by mouth daily. For fluid retention/swelling. 11/13/16   [provider]  pantoprazole  (PROTONIX) 40 MG tablet TAKE 1 TABLET (40 MG TOTAL) BY MOUTH 2 (TWO) TIMES DAILY BEFORE A MEAL. 07/21/18   Gelene Mink, NP  Potassium Chloride ER 20 MEQ TBCR 2 PO BID FOR 3 DAYS 07/24/17   West Bali, MD    Family History Family History  Problem Relation Age of Onset  . ALS Mother   . Cardiomyopathy Father   . Colon cancer Neg Hx     Social History Social History   Tobacco Use  . Smoking status: Current Every Day Smoker    Packs/day: 0.10    Years: 20.00    Pack years: 2.00    Types: Cigarettes  . Smokeless tobacco: Never Used  Substance Use Topics  . Alcohol use: Yes    Alcohol/week: 0.0 standard drinks    Comment: seldom  . Drug use: Yes    Frequency: 2.0 times per week    Types: Marijuana    Comment: marijuana maybe once a week     Allergies   Sulfa antibiotics   Review of Systems Review of Systems  Constitutional: Negative for appetite change, chills and fever.  Respiratory: Negative for cough and shortness of breath.   Cardiovascular: Negative for chest pain.  Gastrointestinal: Negative for abdominal pain, constipation, diarrhea, nausea and vomiting.  Genitourinary: Positive for flank pain. Negative for decreased urine volume, difficulty urinating, dysuria and pelvic pain.  Musculoskeletal: Positive for back pain. Negative for joint swelling and neck pain.  Skin: Negative for rash.  Neurological: Negative for weakness and numbness.     Physical Exam Updated Vital Signs BP 133/83 (BP Location: Right Arm)   Pulse 70   Temp 98 F (36.7 C) (Oral)   Resp 16   Ht  (1.575 m)   Wt 65.8 kg   LMP 05/21/1997   SpO2 100%   BMI 26.52 kg/m   Physical Exam Vitals signs and nursing note reviewed.  Constitutional:      Appearance: Normal appearance. She is not ill-appearing or toxic-appearing.  HENT:     Head: Atraumatic.     Mouth/Throat:     Mouth: Mucous membranes are moist.  Neck:     Musculoskeletal: Normal range of motion.  Cardiovascular:      Rate and Rhythm: Normal rate and regular rhythm.     Pulses: Normal pulses.  Pulmonary:     Effort: Pulmonary effort is normal.     Breath sounds: Normal breath sounds.  Abdominal:     Palpations: Abdomen is soft. There is no mass.     Tenderness: There is no abdominal tenderness.  Musculoskeletal: Normal range of motion.        General: Tenderness present.     Comments: ttp of right thoracic, lumbar paraspinal muscles.  No spinal tenderness.    Skin:    General: Skin  is warm.     Capillary Refill: Capillary refill takes less than 2 seconds.     Findings: No erythema or rash.  Neurological:     General: No focal deficit present.     Mental Status: She is alert.  Psychiatric:        Mood and Affect: Mood normal.      ED Treatments / Results  Labs (all labs ordered are listed, but only abnormal results are displayed) Labs Reviewed  COMPREHENSIVE METABOLIC PANEL - Abnormal; Notable for the following components:      Result Value   Potassium 2.8 (*)    All other components within normal limits  CBC WITH DIFFERENTIAL/PLATELET - Abnormal; Notable for the following components:   RBC 3.86 (*)    All other components within normal limits  URINALYSIS, ROUTINE W REFLEX MICROSCOPIC  LIPASE, BLOOD  TROPONIN I    EKG EKG Interpretation  Date/Time:  Thursday Oct 16 2018 21:48:50 EDT Ventricular Rate:  60 PR Interval:    QRS Duration: 84 QT Interval:  432 QTC Calculation: 432 R Axis:   71 Text Interpretation:  Sinus rhythm Confirmed by Donnetta Hutching ([id number]) on 10/16/2018 10:01:36 PM   Radiology Ct Abdomen Wo Contrast  Result Date: 10/16/2018 CLINICAL DATA:  Right upper quadrant pain for 1 month. EXAM: CT ABDOMEN WITHOUT CONTRAST TECHNIQUE: Multidetector CT imaging of the abdomen was performed following the standard protocol without IV contrast. COMPARISON:  CT [facility] 11/15/2016 FINDINGS: Lower chest: Subsegmental atelectasis identified within both lung bases. The no pleural  effusion or airspace consolidation. Hepatobiliary: No focal liver abnormality is seen. Status post cholecystectomy. No biliary dilatation. Pancreas: Unremarkable. No pancreatic ductal dilatation or surrounding inflammatory changes. Spleen: Normal in size without focal abnormality. Adrenals/Urinary Tract: Normal appearance of the adrenal glands. No kidney mass or hydronephrosis identified. Stomach/Bowel: Stomach appears normal. No abnormal bowel dilatation identified the visualized portions of the colon are unremarkable. Vascular/Lymphatic: Mild aortic atherosclerosis. No aneurysm. No abdominopelvic adenopathy. Other: No free fluid or fluid collection. Musculoskeletal: No acute or significant osseous findings. IMPRESSION: No acute findings identified within the abdomen or pelvis. No explanation for patient's right upper quadrant pain in this patient who is status post cholecystectomy. Bibasilar subsegmental atelectasis noted within the lung bases. Electronically Signed   By: Signa Kell M.D.   On: 10/16/2018 16:19   Dg Chest 2 View  Result Date: 10/16/2018 CLINICAL DATA:  Chest pain. EXAM: CHEST - 2 VIEW COMPARISON:  11/04/2017 FINDINGS: The cardiac silhouette, mediastinal and hilar contours are within normal limits and stable. Streaky subsegmental basilar atelectasis but no infiltrates, effusions or edema. The bony thorax is intact. IMPRESSION: Streaky basilar subsegmental atelectasis but no infiltrates or effusions. Electronically Signed   By: Rudie Meyer M.D.   On: 10/16/2018 19:57    Procedures Procedures (including critical care time)  Medications Ordered in ED Medications  morphine 4 MG/ML injection 4 mg (4 mg Intramuscular Given 10/16/18 2008)  ondansetron (ZOFRAN-ODT) disintegrating tablet 4 mg (4 mg Oral Given 10/16/18 2008)     Initial Impression / Assessment and Plan / ED Course  I have reviewed the triage vital signs and the nursing notes.  Pertinent labs & imaging results that were  available during my care of the patient were reviewed by me and considered in my medical decision making (see chart for details).       Patient comes emergency department with continued pain to her right back and flank.  CT of abdomen pelvis from earlier  today was negative.  Will obtain labs and chest x-ray.  2110  Pt called out to nursing with compliant of chest pain after receiving morphine.  States she doesn't "do well" with narcotic pain medication.  Will repeat her EKG.   Repeat EKG reassuring and read by Dr. Adriana Simasook.  2140  Pt reports pain resolved after belching and walking around.    She is noted to be hypokalemic.  Hx of same and takes HCTZ.  Will prescribe potassium and patient agrees to follow-up with PCP to have this rechecked next week.  Etiology of right flank pain is unclear, but believed to be musculoskeletal.  Patient also seen by Dr. Adriana Simasook and care plan discussed.  She appears appropriate for discharge home and agrees to treatment plan.   Final Clinical Impressions(s) / ED Diagnoses   Final diagnoses:  Right flank pain  Hypokalemia    ED Discharge Orders    None       Rosey Bathriplett, Rishav Rockefeller, PA-C 10/16/18 2223    Donnetta Hutchingook, Brian, MD 10/25/18 [phone]

## 2019-05-12 DIAGNOSIS — R109 Unspecified abdominal pain: Secondary | ICD-10-CM | POA: Diagnosis not present

## 2019-05-13 DIAGNOSIS — F410 Panic disorder [episodic paroxysmal anxiety] without agoraphobia: Secondary | ICD-10-CM | POA: Diagnosis not present

## 2019-05-18 ENCOUNTER — Encounter: Payer: Self-pay | Admitting: Gastroenterology

## 2019-06-01 ENCOUNTER — Encounter: Payer: Self-pay | Admitting: Gastroenterology

## 2019-06-01 ENCOUNTER — Ambulatory Visit: Payer: 59 | Admitting: Gastroenterology

## 2019-06-03 ENCOUNTER — Other Ambulatory Visit: Payer: Self-pay

## 2019-06-03 ENCOUNTER — Encounter: Payer: Self-pay | Admitting: Gastroenterology

## 2019-06-03 ENCOUNTER — Ambulatory Visit: Payer: BC Managed Care – PPO | Admitting: Gastroenterology

## 2019-06-03 VITALS — BP 129/86 | HR 93 | Temp 96.6°F | Ht 62.0 in | Wt 166.4 lb

## 2019-06-03 DIAGNOSIS — K625 Hemorrhage of anus and rectum: Secondary | ICD-10-CM | POA: Diagnosis not present

## 2019-06-03 DIAGNOSIS — K219 Gastro-esophageal reflux disease without esophagitis: Secondary | ICD-10-CM | POA: Diagnosis not present

## 2019-06-03 DIAGNOSIS — R197 Diarrhea, unspecified: Secondary | ICD-10-CM

## 2019-06-03 DIAGNOSIS — K591 Functional diarrhea: Secondary | ICD-10-CM | POA: Diagnosis not present

## 2019-06-03 DIAGNOSIS — R109 Unspecified abdominal pain: Secondary | ICD-10-CM | POA: Diagnosis not present

## 2019-06-03 DIAGNOSIS — E739 Lactose intolerance, unspecified: Secondary | ICD-10-CM | POA: Diagnosis not present

## 2019-06-03 MED ORDER — HYDROCORTISONE (PERIANAL) 2.5 % EX CREA: 1.0000 "application " | TOPICAL_CREAM | Freq: Two times a day (BID) | CUTANEOUS | 0 refills | Status: DC

## 2019-06-03 NOTE — Patient Instructions (Addendum)
1. Please have you labs done. We will contact you with results as available.  2. Try Anusol cream on external and internal hemorrhoids if needed for rectal bleeding or pain. RX sent to your pharmacy.  3. Try increasing daily fiber, see handout.  Also try adding a fiber supplement such as Benefiber or Fiberchoice as per package instructions.  If you notice worsening diarrhea then stop.  Hopefully this will help out bulk up and even your bowel movements. 4. Try adding a probiotic once daily for 4 weeks to help bowel function. 5. IBS guard is an over-the-counter product for irritable bowel which helps with cramps and diarrhea.  It is a natural product. 6. Continue to avoid dairy products. 7. Return to the office in August 2021.   Diet for Irritable Bowel Syndrome When you have irritable bowel syndrome (IBS), it is very important to eat the foods and follow the eating habits that are best for your condition. IBS may cause various symptoms such as pain in the abdomen, constipation, or diarrhea. Choosing the right foods can help to ease the discomfort from these symptoms. Work with your health care provider and diet and nutrition specialist (dietitian) to find the eating plan that will help to control your symptoms. What are tips for following this plan?      Keep a food diary. This will help you identify foods that cause symptoms. Write down: ? What you eat and when you eat it. ? What symptoms you have. ? When symptoms occur in relation to your meals, such as "pain in abdomen 2 hours after dinner."  Eat your meals slowly and in a relaxed setting.  Aim to eat 5-6 small meals per day. Do not skip meals.  Drink enough fluid to keep your urine pale yellow.  Ask your health care provider if you should take an over-the-counter probiotic to help restore healthy bacteria in your gut (digestive tract). ? Probiotics are foods that contain good bacteria and yeasts.  Your dietitian may have specific  dietary recommendations for you based on your symptoms. He or she may recommend that you: ? Avoid foods that cause symptoms. Talk with your dietitian about other ways to get the same nutrients that are in those problem foods. ? Avoid foods with gluten. Gluten is a protein that is found in rye, wheat, and barley. ? Eat more foods that contain soluble fiber. Examples of foods with high soluble fiber include oats, seeds, and certain fruits and vegetables. Take a fiber supplement if directed by your dietitian. ? Reduce or avoid certain foods called FODMAPs. These are foods that contain carbohydrates that are hard to digest. Ask your doctor which foods contain these carbohydrates. What foods are not recommended? The following are some foods and drinks that may make your symptoms worse:  Fatty foods, such as french fries.  Foods that contain gluten, such as pasta and cereal.  Dairy products, such as milk, cheese, and ice cream.  Chocolate.  Alcohol.  Products with caffeine, such as coffee.  Carbonated drinks, such as soda.  Foods that are high in FODMAPs. These include certain fruits and vegetables.  Products with sweeteners such as honey, high fructose corn syrup, sorbitol, and mannitol. The items listed above may not be a complete list of foods and beverages you should avoid. Contact a dietitian for more information. What foods are good sources of fiber? Your health care provider or dietitian may recommend that you eat more foods that contain fiber. Fiber can help  to reduce constipation and other IBS symptoms. Add foods with fiber to your diet a little at a time so your body can get used to them. Too much fiber at one time might cause gas and swelling of your abdomen. The following are some foods that are good sources of fiber:  Berries, such as raspberries, strawberries, and blueberries.  Tomatoes.  Carrots.  Brown rice.  Oats.  Seeds, such as chia and pumpkin seeds. The items  listed above may not be a complete list of recommended sources of fiber. Contact your dietitian for more options. Where to find more information  International Foundation for Functional Gastrointestinal Disorders: www.iffgd.[employer] of Diabetes and Digestive and Kidney Diseases: Car[employer]tn Summary  When you have irritable bowel syndrome (IBS), it is very important to eat the foods and follow the eating habits that are best for your condition.  IBS may cause various symptoms such as pain in the abdomen, constipation, or diarrhea.  Choosing the right foods can help to ease the discomfort that comes from symptoms.  Keep a food diary. This will help you identify foods that cause symptoms.  Your health care provider or diet and nutrition specialist (dietitian) may recommend that you eat more foods that contain fiber. This information is not intended to replace advice given to you by your health care provider. Make sure you discuss any questions you have with your health care provider. Document Revised: 08/27/2018 Document Reviewed: 01/08/2017 Elsevier Patient Education  2020 Elsevier Inc.   Irritable Bowel Syndrome, Adult  Irritable bowel syndrome (IBS) is a group of symptoms that affects the organs responsible for digestion (gastrointestinal or GI tract). IBS is not one specific disease. To regulate how the GI tract works, the body sends signals back and forth between the intestines and the brain. If you have IBS, there may be a problem with these signals. As a result, the GI tract does not function normally. The intestines may become more sensitive and overreact to certain things. This may be especially true when you eat certain foods or when you are under stress. There are four types of IBS. These may be determined based on the consistency of your stool (feces):  IBS with diarrhea.  IBS with constipation.  Mixed IBS.  Unsubtyped IBS. It is important to know  which type of IBS you have. Certain treatments are more likely to be helpful for certain types of IBS. What are the causes? The exact cause of IBS is not known. What increases the risk? You may have a higher risk for IBS if you:  Are female.  Are younger than 22.  Have a family history of IBS.  Have a mental health condition, such as depression, anxiety, or post-traumatic stress disorder.  Have had a bacterial infection of your GI tract. What are the signs or symptoms? Symptoms of IBS vary from person to person. The main symptom is abdominal pain or discomfort. Other symptoms usually include one or more of the following:  Diarrhea, constipation, or both.  Abdominal swelling or bloating.  Feeling full after eating a small or regular-sized meal.  Frequent gas.  Mucus in the stool.  A feeling of having more stool left after a bowel movement. Symptoms tend to come and go. They may be triggered by stress, mental health conditions, or certain foods. How is this diagnosed? This condition may be diagnosed based on a physical exam, your medical history, and your symptoms. You may have tests, such as:  Blood  tests.  Stool test.  X-rays.  CT scan.  Colonoscopy. This is a procedure in which your GI tract is viewed with a long, thin, flexible tube. How is this treated? There is no cure for IBS, but treatment can help relieve symptoms. Treatment depends on the type of IBS you have, and may include:  Changes to your diet, such as: ? Avoiding foods that cause symptoms. ? Drinking more water. ? Following a low-FODMAP (fermentable oligosaccharides, disaccharides, monosaccharides, and polyols) diet for up to 6 weeks, or as told by your health care provider. FODMAPs are sugars that are hard for some people to digest. ? Eating more fiber. ? Eating medium-sized meals at the same times every day.  Medicines. These may include: ? Fiber supplements, if you have constipation. ? Medicine  to control diarrhea (antidiarrheal medicines). ? Medicine to help control muscle tightening (spasms) in your GI tract (antispasmodic medicines). ? Medicines to help with mental health conditions, such as antidepressants or tranquilizers.  Talk therapy or counseling.  Working with a diet and nutrition specialist (dietitian) to help create a food plan that is right for you.  Managing your stress. Follow these instructions at home: Eating and drinking  Eat a healthy diet.  Eat medium-sized meals at about the same time every day. Do not eat large meals.  Gradually eat more fiber-rich foods. These include whole grains, fruits, and vegetables. This may be especially helpful if you have IBS with constipation.  Eat a diet low in FODMAPs.  Drink enough fluid to keep your urine pale yellow.  Keep a journal of foods that seem to trigger symptoms.  Avoid foods and drinks that: ? Contain added sugar. ? Make your symptoms worse. Dairy products, caffeinated drinks, and carbonated drinks can make symptoms worse for some people. General instructions  Take over-the-counter and prescription medicines and supplements only as told by your health care provider.  Get enough exercise. Do at least 150 minutes of moderate-intensity exercise each week.  Manage your stress. Getting enough sleep and exercise can help you manage stress.  Keep all follow-up visits as told by your health care provider and therapist. This is important. Alcohol Use  Do not drink alcohol if: ? Your health care provider tells you not to drink. ? You are pregnant, may be pregnant, or are planning to become pregnant.  If you drink alcohol, limit how much you have: ? 0-1 drink a day for women. ? 0-2 drinks a day for men.  Be aware of how much alcohol is in your drink. In the [place] one drink equals one typical bottle of beer (12 oz), one-half glass of wine (5 oz), or one shot of hard liquor (1 oz). Contact a health care  provider if you have:  Constant pain.  Weight loss.  Difficulty or pain when swallowing.  Diarrhea that gets worse. Get help right away if you have:  Severe abdominal pain.  Fever.  Diarrhea with symptoms of dehydration, such as dizziness or dry mouth.  Bright red blood in your stool.  Stool that is black and tarry.  Abdominal swelling.  Vomiting that does not stop.  Blood in your vomit. Summary  Irritable bowel syndrome (IBS) is not one specific disease. It is a group of symptoms that affects digestion.  Your intestines may become more sensitive and overreact to certain things. This may be especially true when you eat certain foods or when you are under stress.  There is no cure for IBS, but treatment  can help relieve symptoms. This information is not intended to replace advice given to you by your health care provider. Make sure you discuss any questions you have with your health care provider. Document Revised: 04/30/2017 Document Reviewed: 04/30/2017 Elsevier Patient Education  2020 Elsevier Inc.   High-Fiber Diet Fiber, also called dietary fiber, is a type of carbohydrate that is found in fruits, vegetables, whole grains, and beans. A high-fiber diet can have many health benefits. Your health care provider may recommend a high-fiber diet to help:  Prevent constipation. Fiber can make your bowel movements more regular.  Lower your cholesterol.  Relieve the following conditions: ? Swelling of veins in the anus (hemorrhoids). ? Swelling and irritation (inflammation) of specific areas of the digestive tract (uncomplicated diverticulosis). ? A problem of the large intestine (colon) that sometimes causes pain and diarrhea (irritable bowel syndrome, IBS).  Prevent overeating as part of a weight-loss plan.  Prevent heart disease, type 2 diabetes, and certain cancers. What is my plan? The recommended daily fiber intake in grams (g) includes:  38 g for men age 53  or younger.  30 g for men over age 53.  25 g for women age 53 or younger.  21 g for women over age 53. You can get the recommended daily intake of dietary fiber by:  Eating a variety of fruits, vegetables, grains, and beans.  Taking a fiber supplement, if it is not possible to get enough fiber through your diet. What do I need to know about a high-fiber diet?  It is better to get fiber through food sources rather than from fiber supplements. There is not a lot of research about how effective supplements are.  Always check the fiber content on the nutrition facts label of any prepackaged food. Look for foods that contain 5 g of fiber or more per serving.  Talk with a diet and nutrition specialist (dietitian) if you have questions about specific foods that are recommended or not recommended for your medical condition, especially if those foods are not listed below.  Gradually increase how much fiber you consume. If you increase your intake of dietary fiber too quickly, you may have bloating, cramping, or gas.  Drink plenty of water. Water helps you to digest fiber. What are tips for following this plan?  Eat a wide variety of high-fiber foods.  Make sure that half of the grains that you eat each day are whole grains.  Eat breads and cereals that are made with whole-grain flour instead of refined flour or white flour.  Eat brown rice, bulgur wheat, or millet instead of white rice.  Start the day with a breakfast that is high in fiber, such as a cereal that contains 5 g of fiber or more per serving.  Use beans in place of meat in soups, salads, and pasta dishes.  Eat high-fiber snacks, such as berries, raw vegetables, nuts, and popcorn.  Choose whole fruits and vegetables instead of processed forms like juice or sauce. What foods can I eat?  Fruits Berries. Pears. Apples. Oranges. Avocado. Prunes and raisins. Dried figs. Vegetables Sweet potatoes. Spinach. Kale. Artichokes.  Cabbage. Broccoli. Cauliflower. Green peas. Carrots. Squash. Grains Whole-grain breads. Multigrain cereal. Oats and oatmeal. Brown rice. Barley. Bulgur wheat. Millet. Quinoa. Bran muffins. Popcorn. Rye wafer crackers. Meats and other proteins Navy, kidney, and pinto beans. Soybeans. Split peas. Lentils. Nuts and seeds. Dairy Fiber-fortified yogurt. Beverages Fiber-fortified soy milk. Fiber-fortified orange juice. Other foods Fiber bars. The items listed  above may not be a complete list of recommended foods and beverages. Contact a dietitian for more options. What foods are not recommended? Fruits Fruit juice. Cooked, strained fruit. Vegetables Fried potatoes. Canned vegetables. Well-cooked vegetables. Grains White bread. Pasta made with refined flour. White rice. Meats and other proteins Fatty cuts of meat. Fried chicken or fried fish. Dairy Milk. Yogurt. Cream cheese. Sour cream. Fats and oils Butters. Beverages Soft drinks. Other foods Cakes and pastries. The items listed above may not be a complete list of foods and beverages to avoid. Contact a dietitian for more information. Summary  Fiber is a type of carbohydrate. It is found in fruits, vegetables, whole grains, and beans.  There are many health benefits of eating a high-fiber diet, such as preventing constipation, lowering blood cholesterol, helping with weight loss, and reducing your risk of heart disease, diabetes, and certain cancers.  Gradually increase your intake of fiber. Increasing too fast can result in cramping, bloating, and gas. Drink plenty of water while you increase your fiber.  The best sources of fiber include whole fruits and vegetables, whole grains, nuts, seeds, and beans. This information is not intended to replace advice given to you by your health care provider. Make sure you discuss any questions you have with your health care provider. Document Revised: 03/11/2017 Document Reviewed:  03/11/2017 Elsevier Patient Education  2020 [employer].

## 2019-06-03 NOTE — Progress Notes (Signed)
Primary Care Physician: Samuella Bruin  Primary Gastroenterologist:  Jonette Eva, MD   Chief Complaint  Patient presents with  . Abdominal Pain    lower abd  . Rectal Bleeding    HPI: [name] [name] is a 54 y.o. female here for further evaluation of chronic abdominal pain, diarrhea, rectal bleeding.  Sent to be reevaluated at the request of Lenise Herald, PA-C.  She was last seen in 2019 at time of colonoscopy.  Colon was normal appearing.  She had a redundant colon.  External and internal hemorrhoids noted.  Random colon biopsies were negative.  CT abdomen pelvis without contrast 10/08/2018: Unremarkable.   She complains of at least a 1 year history of persistent postprandial loose stools.  4-5 stools per day.  She cannot recall her last solid stool.  Some intermittent brbpr.  Recently she decided to make this appointment because she had couple of days of abdominal pain with nausea but went away. More diarrhea with this episode.  She has rare nocturnal symptoms.  A handful of times she seen a little blood in the stool.  Denies rectal pain.  Reflux is well controlled.  She has gained 20 pounds this year.  Stools are worse if she is stressed.  She is also lactose intolerant, avoiding lactose.  Drinks almond milk only.  Does not eat a lot of cheese, no ice cream.     Current Outpatient Medications  Medication Sig Dispense Refill  . ADDERALL XR 30 MG 24 hr capsule Take 30 mg by mouth daily as needed. For focusing while at work    . ALPRAZolam (XANAX) 0.5 MG tablet Take 0.5 mg by mouth 2 (two) times daily as needed for anxiety.    . hydrochlorothiazide (MICROZIDE) 12.5 MG capsule Take 12.5 mg by mouth daily. For fluid retention/swelling.  6  . pantoprazole (PROTONIX) 40 MG tablet TAKE 1 TABLET (40 MG TOTAL) BY MOUTH 2 (TWO) TIMES DAILY BEFORE A MEAL. 60 tablet 5   No current facility-administered medications for this visit.    Allergies as of 06/03/2019 - Review Complete  06/03/2019  Allergen Reaction Noted  . Sulfa antibiotics Hives and Nausea And Vomiting 03/06/2011   Past Medical History:  Diagnosis Date  . Anxiety   . Bradycardia 03/02/2015  . Depression   . GERD (gastroesophageal reflux disease)   . History of hysterectomy   . History of oophorectomy   . Hx of cholecystectomy   . Hypertension   . IDA (iron deficiency anemia)   . Near syncope 03/02/2015  . Sciatica neuralgia, right   . Stroke [facility])    "light stroke 08/2015. No deficits  . Tobacco abuse 03/03/2015  . Vertigo    Past Surgical History:  Procedure Laterality Date  . ABDOMINAL HYSTERECTOMY    . BIOPSY  12/04/2016   Procedure: BIOPSY;  Surgeon: West Bali, MD;  Location: [facility];  Service: Endoscopy;;  gastric and duodenal biopsy  . BIOPSY  07/30/2017   Procedure: BIOPSY;  Surgeon: West Bali, MD;  Location: [facility];  Service: Endoscopy;;  random colon bx's  . CESAREAN SECTION     x2  . CHOLECYSTECTOMY    . COLONOSCOPY WITH ESOPHAGOGASTRODUODENOSCOPY (EGD)  2007   Dr. Juanda Chance: normal  . COLONOSCOPY WITH PROPOFOL N/A 07/30/2017   Dr. Darrick Penna: Redundant colon, external/internal hemorrhoids. Colon otherwise normal.  Random colon biopsies negative.  . ESOPHAGOGASTRODUODENOSCOPY (EGD) WITH PROPOFOL N/A 12/04/2016   Dr. Darrick Penna:  Gastritis but no H.pylori, small bowel bx negative. gastric polyps benign fundic gland  . HEMORRHOID SURGERY     Family History  Problem Relation Age of Onset  . ALS Mother   . Cardiomyopathy Father   . Colon cancer Neg Hx    Social History   Tobacco Use  . Smoking status: Current Every Day Smoker    Packs/day: 0.10    Years: 20.00    Pack years: 2.00    Types: Cigarettes  . Smokeless tobacco: Never Used  Substance Use Topics  . Alcohol use: Yes    Alcohol/week: 0.0 standard drinks    Comment: seldom  . Drug use: Yes    Frequency: 2.0 times per week    Types: Marijuana    Comment: marijuana maybe once a week     ROS:  General: Negative for anorexia, weight loss, fever, chills, fatigue, weakness. ENT: Negative for hoarseness, difficulty swallowing , nasal congestion. CV: Negative for chest pain, angina, palpitations, dyspnea on exertion, peripheral edema.  Respiratory: Negative for dyspnea at rest, dyspnea on exertion, cough, sputum, wheezing.  GI: See history of present illness. GU:  Negative for dysuria, hematuria, urinary incontinence, urinary frequency, nocturnal urination.  Endo: Negative for unusual weight change.    Physical Examination:   BP 129/86   Pulse 93   Temp (!) 96.6 F (35.9 C) (Temporal)   Ht 5\' 2"  (1.575 m)   Wt 166 lb 6.4 oz (75.5 kg)   LMP 05/21/1997   BMI 30.43 kg/m   General: Well-nourished, well-developed in no acute distress.  Eyes: No icterus. Mouth: Oropharyngeal mucosa moist and pink , no lesions erythema or exudate. Lungs: Clear to auscultation bilaterally.  Heart: Regular rate and rhythm, no murmurs rubs or gallops.  Abdomen: Bowel sounds are normal, nontender, nondistended, no hepatosplenomegaly or masses, no abdominal bruits or hernia , no rebound or guarding.   Extremities: No lower extremity edema. No clubbing or deformities. Neuro: Alert and oriented x 4   Skin: Warm and dry, no jaundice.   Psych: Alert and cooperative, normal mood and affect.  Labs:  Lab Results  Component Value Date   CREATININE 0.94 10/16/2018   BUN 14 10/16/2018   NA 137 10/16/2018   K 2.8 (L) 10/16/2018   CL 100 10/16/2018   CO2 26 10/16/2018   Lab Results  Component Value Date   ALT 11 10/16/2018   AST 17 10/16/2018   ALKPHOS 76 10/16/2018   BILITOT 0.4 10/16/2018   Lab Results  Component Value Date   WBC 4.6 10/16/2018   HGB 12.3 10/16/2018   HCT 36.4 10/16/2018   MCV 94.3 10/16/2018   PLT 178 10/16/2018    Imaging Studies: No results found.

## 2019-06-03 NOTE — Assessment & Plan Note (Signed)
Chronic diarrhea.  Colonoscopy with random colon biopsies last year unremarkable.  Suspect IBS-D.  She admits to lactose intolerance as well.  She prefers natural approach to symptomatic control.  We will check thyroid status and rule out celiac disease.    1. Try increasing daily fiber, see handout.  Also try adding a fiber supplement such as Benefiber or Fiberchoice as per package instructions.  If you notice worsening diarrhea then stop.  Hopefully this will help out bulk up and even your bowel movements. 2. Try adding a probiotic once daily for 4 weeks to help bowel function. 3. IBS guard is an over-the-counter product for irritable bowel which helps with cramps and diarrhea.  It is a natural product. 4. Continue to avoid dairy products. 5. Return to the office in August 2021.

## 2019-06-03 NOTE — Assessment & Plan Note (Signed)
Likely hemorrhoid related.  Symptoms intermittent and occasional.  Recommend Anusol cream twice daily up to 10 days as needed.  If frequency increases, she will let us know.

## 2019-06-03 NOTE — Assessment & Plan Note (Signed)
Well controlled on pantoprazole twice daily before meals.  Continue current regimen.  Reinforced antireflux measures.  Return to the office in August 2021.

## 2019-06-04 LAB — TSH+FREE T4: TSH W/REFLEX TO FT4: 1.09 mIU/L

## 2019-06-04 LAB — IGA: Immunoglobulin A: 177 mg/dL (ref 47–310)

## 2019-06-04 LAB — TISSUE TRANSGLUTAMINASE, IGA: (tTG) Ab, IgA: 1 U/mL

## 2019-06-08 NOTE — Progress Notes (Signed)
Cc'ed to pcp °

## 2019-06-25 DIAGNOSIS — R609 Edema, unspecified: Secondary | ICD-10-CM | POA: Diagnosis not present

## 2019-06-25 DIAGNOSIS — J019 Acute sinusitis, unspecified: Secondary | ICD-10-CM | POA: Diagnosis not present

## 2019-08-07 DIAGNOSIS — J069 Acute upper respiratory infection, unspecified: Secondary | ICD-10-CM | POA: Diagnosis not present

## 2019-08-07 DIAGNOSIS — Z6826 Body mass index (BMI) 26.0-26.9, adult: Secondary | ICD-10-CM | POA: Diagnosis not present

## 2019-08-07 DIAGNOSIS — E663 Overweight: Secondary | ICD-10-CM | POA: Diagnosis not present

## 2019-08-07 DIAGNOSIS — N764 Abscess of vulva: Secondary | ICD-10-CM | POA: Diagnosis not present

## 2019-08-17 DIAGNOSIS — M79672 Pain in left foot: Secondary | ICD-10-CM | POA: Diagnosis not present

## 2019-08-17 DIAGNOSIS — M79671 Pain in right foot: Secondary | ICD-10-CM | POA: Diagnosis not present

## 2019-08-17 DIAGNOSIS — B353 Tinea pedis: Secondary | ICD-10-CM | POA: Diagnosis not present

## 2019-09-11 DIAGNOSIS — G5601 Carpal tunnel syndrome, right upper limb: Secondary | ICD-10-CM | POA: Diagnosis not present

## 2019-09-11 DIAGNOSIS — Z6830 Body mass index (BMI) 30.0-30.9, adult: Secondary | ICD-10-CM | POA: Diagnosis not present

## 2019-09-11 DIAGNOSIS — K219 Gastro-esophageal reflux disease without esophagitis: Secondary | ICD-10-CM | POA: Diagnosis not present

## 2019-09-11 DIAGNOSIS — Z1389 Encounter for screening for other disorder: Secondary | ICD-10-CM | POA: Diagnosis not present

## 2019-09-11 DIAGNOSIS — E6609 Other obesity due to excess calories: Secondary | ICD-10-CM | POA: Diagnosis not present

## 2019-09-21 DIAGNOSIS — B351 Tinea unguium: Secondary | ICD-10-CM | POA: Diagnosis not present

## 2019-09-21 DIAGNOSIS — B353 Tinea pedis: Secondary | ICD-10-CM | POA: Diagnosis not present

## 2019-09-21 DIAGNOSIS — M79672 Pain in left foot: Secondary | ICD-10-CM | POA: Diagnosis not present

## 2019-09-21 DIAGNOSIS — M79671 Pain in right foot: Secondary | ICD-10-CM | POA: Diagnosis not present

## 2019-09-30 DIAGNOSIS — G5601 Carpal tunnel syndrome, right upper limb: Secondary | ICD-10-CM | POA: Diagnosis not present

## 2019-09-30 DIAGNOSIS — G5602 Carpal tunnel syndrome, left upper limb: Secondary | ICD-10-CM | POA: Diagnosis not present

## 2019-09-30 DIAGNOSIS — G5603 Carpal tunnel syndrome, bilateral upper limbs: Secondary | ICD-10-CM | POA: Diagnosis not present

## 2019-10-27 DIAGNOSIS — M79671 Pain in right foot: Secondary | ICD-10-CM | POA: Diagnosis not present

## 2019-10-27 DIAGNOSIS — M79672 Pain in left foot: Secondary | ICD-10-CM | POA: Diagnosis not present

## 2019-10-27 DIAGNOSIS — B351 Tinea unguium: Secondary | ICD-10-CM | POA: Diagnosis not present

## 2019-10-27 DIAGNOSIS — B353 Tinea pedis: Secondary | ICD-10-CM | POA: Diagnosis not present

## 2019-11-04 DIAGNOSIS — F331 Major depressive disorder, recurrent, moderate: Secondary | ICD-10-CM | POA: Diagnosis not present

## 2019-11-04 DIAGNOSIS — F4322 Adjustment disorder with anxiety: Secondary | ICD-10-CM | POA: Diagnosis not present

## 2019-12-12 ENCOUNTER — Encounter: Payer: Self-pay | Admitting: Emergency Medicine

## 2019-12-12 ENCOUNTER — Other Ambulatory Visit: Payer: Self-pay

## 2019-12-12 ENCOUNTER — Ambulatory Visit
Admission: EM | Admit: 2019-12-12 | Discharge: 2019-12-12 | Disposition: A | Discharge status: Home / Self Care | Payer: BC Managed Care – PPO | Attending: Emergency Medicine | Admitting: Emergency Medicine

## 2019-12-12 DIAGNOSIS — N764 Abscess of vulva: Secondary | ICD-10-CM | POA: Diagnosis not present

## 2019-12-12 NOTE — ED Triage Notes (Signed)
Pt here for abscess to left buttocks area; pt sts her PCP called in antibiotics yesterday but she hasnt picked up yet; abscess is now draining

## 2019-12-12 NOTE — ED Provider Notes (Signed)
[facility]   [id number] 12/12/19 Arrival Time: 8144   CC: ABSCESS  SUBJECTIVE:  [name] [name] is a 54 y.o. female who presents with a possible abscess of her LT labia. Onset 2 days ago.  Denies precipitating event.  Tender to the touch.  Was seen by PCP yesterday and prescribed antibiotic.  Has not started taking antibiotic.  Reports drainage, and swelling.  Denies fever, chills, nausea, vomiting.    ROS: As per HPI.  All other pertinent ROS negative.     Past Medical History:  Diagnosis Date  . Anxiety   . Bradycardia 03/02/2015  . Depression   . GERD (gastroesophageal reflux disease)   . History of hysterectomy   . History of oophorectomy   . Hx of cholecystectomy   . Hypertension   . IDA (iron deficiency anemia)   . Near syncope 03/02/2015  . Sciatica neuralgia, right   . Stroke [facility])    "light stroke 08/2015. No deficits  . Tobacco abuse 03/03/2015  . Vertigo    Past Surgical History:  Procedure Laterality Date  . ABDOMINAL HYSTERECTOMY    . BIOPSY  12/04/2016   Procedure: BIOPSY;  Surgeon: West Bali, MD;  Location: [facility];  Service: Endoscopy;;  gastric and duodenal biopsy  . BIOPSY  07/30/2017   Procedure: BIOPSY;  Surgeon: West Bali, MD;  Location: [facility];  Service: Endoscopy;;  random colon bx's  . CESAREAN SECTION     x2  . CHOLECYSTECTOMY    . COLONOSCOPY WITH ESOPHAGOGASTRODUODENOSCOPY (EGD)  2007   Dr. Juanda Chance: normal  . COLONOSCOPY WITH PROPOFOL N/A 07/30/2017   Dr. Darrick Penna: Redundant colon, external/internal hemorrhoids. Colon otherwise normal.  Random colon biopsies negative.  . ESOPHAGOGASTRODUODENOSCOPY (EGD) WITH PROPOFOL N/A 12/04/2016   Dr. Darrick Penna: Gastritis but no H.pylori, small bowel bx negative. gastric polyps benign fundic gland  . HEMORRHOID SURGERY     Allergies  Allergen Reactions  . Sulfa Antibiotics Hives and Nausea And Vomiting   No current facility-administered medications on file prior to encounter.     Current Outpatient Medications on File Prior to Encounter  Medication Sig Dispense Refill  . ADDERALL XR 30 MG 24 hr capsule Take 30 mg by mouth daily as needed. For focusing while at work    . ALPRAZolam (XANAX) 0.5 MG tablet Take 0.5 mg by mouth 2 (two) times daily as needed for anxiety.    . hydrochlorothiazide (MICROZIDE) 12.5 MG capsule Take 12.5 mg by mouth daily. For fluid retention/swelling.  6  . hydrocortisone (ANUSOL-HC) 2.5 % rectal cream Place 1 application rectally 2 (two) times daily. For up to 10 days at a time as needed. 30 g 0  . pantoprazole (PROTONIX) 40 MG tablet TAKE 1 TABLET (40 MG TOTAL) BY MOUTH 2 (TWO) TIMES DAILY BEFORE A MEAL. 60 tablet 5   Social History   Socioeconomic History  . Marital status: Single    Spouse name: Not on file  . Number of children: Not on file  . Years of education: Not on file  . Highest education level: Not on file  Occupational History  . Not on file  Tobacco Use  . Smoking status: Current Every Day Smoker    Packs/day: 0.10    Years: 20.00    Pack years: 2.00    Types: Cigarettes  . Smokeless tobacco: Never Used  Substance and Sexual Activity  . Alcohol use: Yes    Alcohol/week: 0.0 standard drinks  Comment: seldom  . Drug use: Yes    Frequency: 2.0 times per week    Types: Marijuana    Comment: marijuana maybe once a week  . Sexual activity: Yes    Birth control/protection: Surgical  Other Topics Concern  . Not on file  Social History Narrative   Drinks 5-6 cups of coffee daily.   Social Determinants of Health   Financial Resource Strain:   . Difficulty of Paying Living Expenses:   Food Insecurity:   . Worried About Programme researcher, broadcasting/film/video in the Last Year:   . Barista in the Last Year:   Transportation Needs:   . Freight forwarder (Medical):   Marland Kitchen Lack of Transportation (Non-Medical):   Physical Activity:   . Days of Exercise per Week:   . Minutes of Exercise per Session:   Stress:   . Feeling  of Stress :   Social Connections:   . Frequency of Communication with Friends and Family:   . Frequency of Social Gatherings with Friends and Family:   . Attends Religious Services:   . Active Member of Clubs or Organizations:   . Attends Banker Meetings:   Marland Kitchen Marital Status:   Intimate Partner Violence:   . Fear of Current or Ex-Partner:   . Emotionally Abused:   Marland Kitchen Physically Abused:   . Sexually Abused:    Family History  Problem Relation Age of Onset  . ALS Mother   . Cardiomyopathy Father   . Colon cancer Neg Hx     OBJECTIVE:  Vitals:   12/12/19 0843  BP: (!) 142/92  Pulse: 84  Resp: 18  Temp: 98.7 F (37.1 C)  TempSrc: Oral  SpO2: 98%    General appearance: alert; no distress Skin: 3 cm induration of her LT labia; tender to touch; no active drainage Psychological: alert and cooperative; normal mood and affect  Procedure: Verbal consent obtained. Area over induration cleaned with betadine. Lidocaine 2% with epinephrine used to obtain local anesthesia. The most fluctuant portion of the abscess was incised with a #11 blade scalpel. Abscess cavity explored and evacuated. Loculations broken up with a curved hemostat as best as possible given patient discomfort. Cavity packed with packing material and dressed with a clean gauze dressing. Minimal bleeding. No complications.  ASSESSMENT & PLAN:  1. Abscess of left genital labia    Abscess with incision and drainage:  Keep dry and covered for next 24-48 hours Remove packing in 48 hours either at home or return here After packing is removed in you may then begin appling warm compresses 3-4x daily for 10-15 minutes.  You may then wash site daily with warm water and mild soap Keep covered to avoid friction Take antibiotic as prescribed and to completion Return sooner or go to the ED if you have any new or worsening symptoms such as increased redness, swelling, pain, nausea, vomiting, fever, chills, etc...    Reviewed expectations re: course of current medical issues. Questions answered. Outlined signs and symptoms indicating need for more acute intervention. Patient verbalized understanding. After Visit Summary given.          Rennis Harding, PA-C 12/12/19 [phone]

## 2019-12-12 NOTE — Discharge Instructions (Signed)
Keep dry and covered for next 24-48 hours Remove packing in 48 hours either at home or return here After packing is removed in you may then begin appling warm compresses 3-4x daily for 10-15 minutes.  You may then wash site daily with warm water and mild soap Keep covered to avoid friction Take antibiotic as prescribed and to completion Return sooner or go to the ED if you have any new or worsening symptoms such as increased redness, swelling, pain, nausea, vomiting, fever, chills, etc...  

## 2019-12-30 ENCOUNTER — Ambulatory Visit: Payer: BC Managed Care – PPO | Admitting: Gastroenterology

## 2019-12-30 ENCOUNTER — Encounter: Payer: Self-pay | Admitting: Gastroenterology

## 2020-04-28 DIAGNOSIS — F900 Attention-deficit hyperactivity disorder, predominantly inattentive type: Secondary | ICD-10-CM | POA: Diagnosis not present

## 2020-04-28 DIAGNOSIS — F4310 Post-traumatic stress disorder, unspecified: Secondary | ICD-10-CM | POA: Diagnosis not present

## 2020-06-17 DIAGNOSIS — F17211 Nicotine dependence, cigarettes, in remission: Secondary | ICD-10-CM | POA: Diagnosis not present

## 2020-06-17 DIAGNOSIS — E6609 Other obesity due to excess calories: Secondary | ICD-10-CM | POA: Diagnosis not present

## 2020-06-17 DIAGNOSIS — Z6830 Body mass index (BMI) 30.0-30.9, adult: Secondary | ICD-10-CM | POA: Diagnosis not present

## 2020-06-17 DIAGNOSIS — K219 Gastro-esophageal reflux disease without esophagitis: Secondary | ICD-10-CM | POA: Diagnosis not present

## 2020-07-11 ENCOUNTER — Other Ambulatory Visit (HOSPITAL_COMMUNITY): Payer: Self-pay | Admitting: Physician Assistant

## 2020-07-11 ENCOUNTER — Other Ambulatory Visit: Payer: Self-pay

## 2020-07-11 ENCOUNTER — Ambulatory Visit (HOSPITAL_COMMUNITY)
Admission: RE | Admit: 2020-07-11 | Discharge: 2020-07-11 | Disposition: A | Discharge status: Home / Self Care | Payer: BC Managed Care – PPO | Source: Ambulatory Visit | Attending: Physician Assistant | Admitting: Physician Assistant

## 2020-07-11 DIAGNOSIS — E6609 Other obesity due to excess calories: Secondary | ICD-10-CM | POA: Diagnosis not present

## 2020-07-11 DIAGNOSIS — R079 Chest pain, unspecified: Secondary | ICD-10-CM

## 2020-07-11 DIAGNOSIS — Z1389 Encounter for screening for other disorder: Secondary | ICD-10-CM | POA: Diagnosis not present

## 2020-07-11 DIAGNOSIS — Z1331 Encounter for screening for depression: Secondary | ICD-10-CM | POA: Diagnosis not present

## 2020-07-11 DIAGNOSIS — R635 Abnormal weight gain: Secondary | ICD-10-CM | POA: Diagnosis not present

## 2020-07-11 DIAGNOSIS — Z6831 Body mass index (BMI) 31.0-31.9, adult: Secondary | ICD-10-CM | POA: Diagnosis not present

## 2020-07-18 ENCOUNTER — Ambulatory Visit
Admission: EM | Admit: 2020-07-18 | Discharge: 2020-07-18 | Disposition: A | Discharge status: Home / Self Care | Payer: BC Managed Care – PPO

## 2020-07-18 ENCOUNTER — Encounter: Payer: Self-pay | Admitting: Emergency Medicine

## 2020-07-18 ENCOUNTER — Emergency Department (HOSPITAL_COMMUNITY): Payer: BC Managed Care – PPO

## 2020-07-18 ENCOUNTER — Emergency Department (HOSPITAL_COMMUNITY)
Admission: EM | Admit: 2020-07-18 | Discharge: 2020-07-19 | Disposition: A | Discharge status: Home / Self Care | Payer: BC Managed Care – PPO | Attending: Emergency Medicine | Admitting: Emergency Medicine

## 2020-07-18 ENCOUNTER — Other Ambulatory Visit: Payer: Self-pay

## 2020-07-18 DIAGNOSIS — Z79899 Other long term (current) drug therapy: Secondary | ICD-10-CM | POA: Insufficient documentation

## 2020-07-18 DIAGNOSIS — R109 Unspecified abdominal pain: Secondary | ICD-10-CM | POA: Diagnosis not present

## 2020-07-18 DIAGNOSIS — R0789 Other chest pain: Secondary | ICD-10-CM | POA: Diagnosis not present

## 2020-07-18 DIAGNOSIS — R1011 Right upper quadrant pain: Secondary | ICD-10-CM | POA: Diagnosis not present

## 2020-07-18 DIAGNOSIS — R911 Solitary pulmonary nodule: Secondary | ICD-10-CM | POA: Diagnosis not present

## 2020-07-18 DIAGNOSIS — R079 Chest pain, unspecified: Secondary | ICD-10-CM | POA: Diagnosis not present

## 2020-07-18 DIAGNOSIS — Z8616 Personal history of COVID-19: Secondary | ICD-10-CM

## 2020-07-18 DIAGNOSIS — R071 Chest pain on breathing: Secondary | ICD-10-CM

## 2020-07-18 DIAGNOSIS — F17210 Nicotine dependence, cigarettes, uncomplicated: Secondary | ICD-10-CM | POA: Diagnosis not present

## 2020-07-18 DIAGNOSIS — I10 Essential (primary) hypertension: Secondary | ICD-10-CM | POA: Insufficient documentation

## 2020-07-18 LAB — URINALYSIS, ROUTINE W REFLEX MICROSCOPIC
Bilirubin Urine: NEGATIVE
Glucose, UA: NEGATIVE mg/dL
Hgb urine dipstick: NEGATIVE
Ketones, ur: NEGATIVE mg/dL
Nitrite: NEGATIVE
Protein, ur: NEGATIVE mg/dL
Specific Gravity, Urine: 1.021 (ref 1.005–1.030)
pH: 5 (ref 5.0–8.0)

## 2020-07-18 LAB — BASIC METABOLIC PANEL
Anion gap: 14 (ref 5–15)
BUN: 18 mg/dL (ref 6–20)
CO2: 29 mmol/L (ref 22–32)
Calcium: 9.9 mg/dL (ref 8.9–10.3)
Chloride: 95 mmol/L — ABNORMAL LOW (ref 98–111)
Creatinine, Ser: 1.02 mg/dL — ABNORMAL HIGH (ref 0.44–1.00)
GFR, Estimated: >60 mL/min (ref >60)
Glucose, Bld: 96 mg/dL (ref 70–99)
Potassium: 3.3 mmol/L — ABNORMAL LOW (ref 3.5–5.1)
Sodium: 138 mmol/L (ref 135–145)

## 2020-07-18 LAB — HEPATIC FUNCTION PANEL
ALT: 15 U/L (ref 0–44)
AST: 17 U/L (ref 15–41)
Albumin: 4.3 g/dL (ref 3.5–5.0)
Alkaline Phosphatase: 87 U/L (ref 38–126)
Bilirubin, Direct: 0.1 mg/dL (ref 0.0–0.2)
Indirect Bilirubin: 0.5 mg/dL (ref 0.3–0.9)
Total Bilirubin: 0.6 mg/dL (ref 0.3–1.2)
Total Protein: 8 g/dL (ref 6.5–8.1)

## 2020-07-18 LAB — LIPASE, BLOOD: Lipase: 25 U/L (ref 11–51)

## 2020-07-18 LAB — CBC
HCT: 41.9 % (ref 36.0–46.0)
Hemoglobin: 13.6 g/dL (ref 12.0–15.0)
MCH: 30 pg (ref 26.0–34.0)
MCHC: 32.5 g/dL (ref 30.0–36.0)
MCV: 92.5 fL (ref 80.0–100.0)
Platelets: 297 10*3/uL (ref 150–400)
RBC: 4.53 MIL/uL (ref 3.87–5.11)
RDW: 12.8 % (ref 11.5–15.5)
WBC: 11.5 10*3/uL — ABNORMAL HIGH (ref 4.0–10.5)
nRBC: 0 % (ref 0.0–0.2)

## 2020-07-18 LAB — TROPONIN I (HIGH SENSITIVITY)
Troponin I (High Sensitivity): 3 ng/L (ref <18)
Troponin I (High Sensitivity): 3 ng/L (ref <18)

## 2020-07-18 MED ORDER — IBUPROFEN 600 MG PO TABS
600.0000 mg | ORAL_TABLET | Freq: Four times a day (QID) | ORAL | 0 refills | Status: DC | PRN
Start: 2020-07-18 — End: 2021-08-30

## 2020-07-18 MED ORDER — KETOROLAC TROMETHAMINE 30 MG/ML IJ SOLN
15.0000 mg | Freq: Once | INTRAMUSCULAR | Status: AC
  Administered 2020-07-19: 15 mg via INTRAVENOUS
  Filled 2020-07-18: qty 1

## 2020-07-18 MED ORDER — IOHEXOL 350 MG/ML SOLN
100.0000 mL | Freq: Once | INTRAVENOUS | Status: AC | PRN
  Administered 2020-07-18: 100 mL via INTRAVENOUS

## 2020-07-18 MED ORDER — METHOCARBAMOL 500 MG PO TABS
500.0000 mg | ORAL_TABLET | Freq: Two times a day (BID) | ORAL | 0 refills | Status: DC
Start: 2020-07-18 — End: 2020-09-19

## 2020-07-18 MED ORDER — LIDOCAINE 5 % EX PTCH: 1.0000 | MEDICATED_PATCH | CUTANEOUS | 0 refills | Status: DC

## 2020-07-18 NOTE — Discharge Instructions (Addendum)
Go immediately to the Emergency department for further evaluation of source of your chest pain. Chest x-ray on 2/21 negative for pneumonia and doesn't explain the severity of your pain here today. You warranted further diagnotic evaluation to rule out blood clot in the lung as a source of pain upper right chest.

## 2020-07-18 NOTE — ED Triage Notes (Signed)
Pt here from [facility] with CC of chest pain. Concerns for PE

## 2020-07-18 NOTE — ED Notes (Signed)
Pt returned from CT °

## 2020-07-18 NOTE — ED Triage Notes (Addendum)
Pain under RT breast that radiates around to back 2 weeks.  Had covid in Jan  seen at pcp x 1week ago. Was given steroid and pain meds.  Was told her lungs were clear and labs were ok. Last BM x 2 days ago.  Pt states she usually has BM daily.

## 2020-07-18 NOTE — ED Provider Notes (Signed)
[facility] EMERGENCY DEPARTMENT Provider Note   CSN: [id number] Arrival date & time: 07/18/20  1928     History Chief Complaint  Patient presents with  . Chest Pain    [name] [name] is a 55 y.o. female.  HPI      [name] [name] is a 55 y.o. female, with a history of anxiety, GERD, cholecystectomy, presenting to the ED with right-sided chest discomfort beginning around February 17.  Pain is constant, moderate to severe, radiating toward the right flank and right upper abdomen.  Pain worsens with movement, palpation, breathing. A week ago her PCP recommended a course of treatment, however, patient did not try any medications or therapies for her complaint. She was seen in urgent care today and advised to come to the ED for further assessment.  Denies fever/chills, dizziness, syncope, N/V/D, hematuria, dysuria, difficulty urinating, cough, or any other complaints.  Past Medical History:  Diagnosis Date  . Anxiety   . Bradycardia 03/02/2015  . Depression   . GERD (gastroesophageal reflux disease)   . History of hysterectomy   . History of oophorectomy   . Hx of cholecystectomy   . Hypertension   . IDA (iron deficiency anemia)   . Near syncope 03/02/2015  . Sciatica neuralgia, right   . Stroke [facility])    "light stroke 08/2015. No deficits  . Tobacco abuse 03/03/2015  . Vertigo     Patient Active Problem List   Diagnosis Date Noted  . Dyspepsia 11/24/2016  . Headache 09/07/2016  . Vision, loss, sudden, left 09/07/2016  . Left sided numbness 09/07/2016  . GERD (gastroesophageal reflux disease) 09/07/2016  . Vision loss, left eye 09/07/2016  . Snoring 04/19/2015  . Tobacco use disorder 04/19/2015  . Hypokalemia 03/03/2015  . Normocytic anemia 03/03/2015  . Tobacco abuse 03/03/2015  . Near syncope 03/02/2015  . Bradycardia 03/02/2015  . Pre-syncope 03/02/2015  . ANEMIA, IRON DEFICIENCY 08/01/2007  . RECTAL BLEEDING 08/01/2007  . DIARRHEA, CHRONIC 08/01/2007  .  EPIGASTRIC PAIN 08/01/2007    Past Surgical History:  Procedure Laterality Date  . ABDOMINAL HYSTERECTOMY    . BIOPSY  12/04/2016   Procedure: BIOPSY;  Surgeon: West Bali, MD;  Location: [facility] ENDO SUITE;  Service: Endoscopy;;  gastric and duodenal biopsy  . BIOPSY  07/30/2017   Procedure: BIOPSY;  Surgeon: West Bali, MD;  Location: [facility] ENDO SUITE;  Service: Endoscopy;;  random colon bx's  . CESAREAN SECTION     x2  . CHOLECYSTECTOMY    . COLONOSCOPY WITH ESOPHAGOGASTRODUODENOSCOPY (EGD)  2007   Dr. Juanda Chance: normal  . COLONOSCOPY WITH PROPOFOL N/A 07/30/2017   Dr. Darrick Penna: Redundant colon, external/internal hemorrhoids. Colon otherwise normal.  Random colon biopsies negative.  . ESOPHAGOGASTRODUODENOSCOPY (EGD) WITH PROPOFOL N/A 12/04/2016   Dr. Darrick Penna: Gastritis but no H.pylori, small bowel bx negative. gastric polyps benign fundic gland  . HEMORRHOID SURGERY       OB History   No obstetric history on file.     Family History  Problem Relation Age of Onset  . ALS Mother   . Cardiomyopathy Father   . Colon cancer Neg Hx     Social History   Tobacco Use  . Smoking status: Current Every Day Smoker    Packs/day: 0.10    Years: 20.00    Pack years: 2.00    Types: Cigarettes  . Smokeless tobacco: Never Used  Substance Use Topics  . Alcohol use: Yes    Alcohol/week: 0.0 standard  drinks    Comment: seldom  . Drug use: Yes    Frequency: 2.0 times per week    Types: Marijuana    Comment: marijuana maybe once a week    Home Medications Prior to Admission medications   Medication Sig Start Date End Date Taking? Authorizing Provider  ibuprofen (ADVIL) 600 MG tablet Take 1 tablet (600 mg total) by mouth every 6 (six) hours as needed. 07/18/20  Yes Kenadi Miltner C, PA-C  lidocaine (LIDODERM) 5 % Place 1 patch onto the skin daily. Remove & Discard patch within 12 hours or as directed by MD 07/18/20  Yes Alila Sotero C, PA-C  methocarbamol (ROBAXIN) 500 MG tablet Take 1 tablet  (500 mg total) by mouth 2 (two) times daily. 07/18/20  Yes Kesean Serviss C, PA-C  ADDERALL XR 30 MG 24 hr capsule Take 30 mg by mouth daily as needed. For focusing while at work 11/03/16   [provider]  ALPRAZolam Prudy Feeler(XANAX) 0.5 MG tablet Take 0.5 mg by mouth 2 (two) times daily as needed for anxiety.    [provider]  hydrochlorothiazide (MICROZIDE) 12.5 MG capsule Take 12.5 mg by mouth daily. For fluid retention/swelling. 11/13/16   [provider]  hydrocortisone (ANUSOL-HC) 2.5 % rectal cream Place 1 application rectally 2 (two) times daily. For up to 10 days at a time as needed. 06/03/19   Tiffany KocherLewis, Leslie S, PA-C  pantoprazole (PROTONIX) 40 MG tablet TAKE 1 TABLET (40 MG TOTAL) BY MOUTH 2 (TWO) TIMES DAILY BEFORE A MEAL. 07/21/18   Gelene MinkBoone, Anna W, NP    Allergies    Sulfa antibiotics  Review of Systems   Review of Systems  Constitutional: Negative for chills, diaphoresis and fever.  Respiratory: Negative for cough and shortness of breath.   Cardiovascular: Positive for chest pain.  Gastrointestinal: Positive for abdominal pain. Negative for diarrhea, nausea and vomiting.  Genitourinary: Negative for difficulty urinating, dysuria, frequency and hematuria.  Musculoskeletal: Negative for back pain and neck pain.  Neurological: Negative for dizziness, syncope, weakness, numbness and headaches.  All other systems reviewed and are negative.   Physical Exam Updated Vital Signs BP (!) 174/95   Pulse 78   Temp 97.8 F (36.6 C)   Resp 17   Ht 5\' 2"  (1.575 m)   Wt 72.6 kg   LMP 05/21/1997   SpO2 97%   BMI 29.26 kg/m   Physical Exam Vitals and nursing note reviewed. Exam conducted with a chaperone present.  Constitutional:      General: She is not in acute distress.    Appearance: She is well-developed. She is not diaphoretic.  HENT:     Head: Normocephalic and atraumatic.     Mouth/Throat:     Mouth: Mucous membranes are moist.     Pharynx: Oropharynx is  clear.  Eyes:     Conjunctiva/sclera: Conjunctivae normal.  Cardiovascular:     Rate and Rhythm: Normal rate and regular rhythm.     Pulses: Normal pulses.          Radial pulses are 2+ on the right side and 2+ on the left side.       Posterior tibial pulses are 2+ on the right side and 2+ on the left side.     Heart sounds: Normal heart sounds.     Comments: Tactile temperature in the extremities appropriate and equal bilaterally. Pulmonary:     Effort: Pulmonary effort is normal. No respiratory distress.     Breath sounds:  Normal breath sounds.     Comments: No increased work of breathing.  Speaks in full sentences without difficulty. Chest:     Chest wall: Tenderness present.    Abdominal:     Palpations: Abdomen is soft.     Tenderness: There is abdominal tenderness. There is no guarding.    Musculoskeletal:     Cervical back: Neck supple.     Right lower leg: No edema.     Left lower leg: No edema.  Lymphadenopathy:     Cervical: No cervical adenopathy.  Skin:    General: Skin is warm and dry.  Neurological:     Mental Status: She is alert.  Psychiatric:        Mood and Affect: Mood and affect normal.        Speech: Speech normal.        Behavior: Behavior normal.     ED Results / Procedures / Treatments   Labs (all labs ordered are listed, but only abnormal results are displayed) Labs Reviewed  BASIC METABOLIC PANEL - Abnormal; Notable for the following components:      Result Value   Potassium 3.3 (*)    Chloride 95 (*)    Creatinine, Ser 1.02 (*)    All other components within normal limits  CBC - Abnormal; Notable for the following components:   WBC 11.5 (*)    All other components within normal limits  URINALYSIS, ROUTINE W REFLEX MICROSCOPIC - Abnormal; Notable for the following components:   APPearance HAZY (*)    Leukocytes,Ua TRACE (*)    Bacteria, UA RARE (*)    All other components within normal limits  HEPATIC FUNCTION PANEL  LIPASE, BLOOD   TROPONIN I (HIGH SENSITIVITY)  TROPONIN I (HIGH SENSITIVITY)    EKG EKG Interpretation  Date/Time:  Monday July 18 2020 19:35:03 EST Ventricular Rate:  78 PR Interval:  158 QRS Duration: 72 QT Interval:  372 QTC Calculation: 424 R Axis:   70 Text Interpretation: Normal sinus rhythm Normal ECG Confirmed by Gerhard Munch [phone]) on 07/18/2020 7:47:14 PM   Radiology DG Chest 2 View  Result Date: 07/18/2020 CLINICAL DATA:  Chest pain, right-sided. EXAM: CHEST - 2 VIEW COMPARISON:  Chest x-ray 07/11/2020 FINDINGS: The heart size and mediastinal contours are within normal limits. No focal consolidation. No pulmonary edema. No pleural effusion. No pneumothorax. No acute osseous abnormality. Right upper quadrant surgical clips. IMPRESSION: No active cardiopulmonary disease. Electronically Signed   By: Tish Frederickson M.D.   On: 07/18/2020 20:10   CT Angio Chest PE W and/or Wo Contrast  Result Date: 07/18/2020 CLINICAL DATA:  Right chest wall pain radiating to back, COVID-08 June 2020, abdominal pain EXAM: CT ANGIOGRAPHY CHEST CT ABDOMEN AND PELVIS WITH CONTRAST TECHNIQUE: Multidetector CT imaging of the chest was performed using the standard protocol during bolus administration of intravenous contrast. Multiplanar CT image reconstructions and MIPs were obtained to evaluate the vascular anatomy. Multidetector CT imaging of the abdomen and pelvis was performed using the standard protocol during bolus administration of intravenous contrast. CONTRAST:  OMNIPAQUE IOHEXOL 350 MG/ML SOLN COMPARISON:  07/18/2020, 10/16/2018, 11/15/2016 FINDINGS: CTA CHEST FINDINGS Cardiovascular: This is a technically adequate evaluation of the pulmonary vasculature. No filling defects or pulmonary emboli. The heart is unremarkable without pericardial effusion. No evidence of thoracic aortic aneurysm or dissection. Mediastinum/Nodes: No enlarged mediastinal, hilar, or axillary lymph nodes. Thyroid gland,  trachea, and esophagus demonstrate no significant findings. Lungs/Pleura: No acute airspace disease, effusion,  or pneumothorax. There is a 6 x 5 mm right lower lobe pulmonary nodule image 74/7. No comparison imaging through this portion of the chest is available. Other small pulmonary nodules within the bilateral costophrenic angles are unchanged since 2018, largest measuring 3 mm in the right lower lobe image 94 and 4 mm in the left lower lobe image 92. Given long-term stability, these are benign. Central airways are patent. Musculoskeletal: No acute or destructive bony lesions. Reconstructed images demonstrate no additional findings. Review of the MIP images confirms the above findings. CT ABDOMEN and PELVIS FINDINGS Hepatobiliary: No focal liver abnormality is seen. Status post cholecystectomy. No biliary dilatation. Pancreas: Unremarkable. No pancreatic ductal dilatation or surrounding inflammatory changes. Spleen: Normal in size without focal abnormality. Adrenals/Urinary Tract: Adrenal glands are unremarkable. Kidneys are normal, without renal calculi, focal lesion, or hydronephrosis. Bladder is unremarkable. Stomach/Bowel: No bowel obstruction or ileus. Normal appendix right lower quadrant. No bowel wall thickening or inflammatory change. Vascular/Lymphatic: Aortic atherosclerosis. No enlarged abdominal or pelvic lymph nodes. Reproductive: Status post hysterectomy. No adnexal masses. Other: No free fluid or free gas.  No abdominal wall hernia. Musculoskeletal: No acute or destructive bony lesions. Reconstructed images demonstrate no additional findings. Review of the MIP images confirms the above findings. IMPRESSION: 1. No evidence of pulmonary embolus. 2. No acute intrathoracic, intra-abdominal, or intrapelvic process. 3. 5 mm mean diameter right lower lobe pulmonary nodule without comparison available. Numerous other bilateral lower lobe less than 4 mm nodules are stable since 2018. No follow-up needed if  patient is low-risk (and has no known or suspected primary neoplasm). Non-contrast chest CT can be considered in 12 months if patient is high-risk. This recommendation follows the consensus statement: Guidelines for Management of Incidental Pulmonary Nodules Detected on CT Images: From the Fleischner Society 2017; Radiology 2017; 284:228-243. 4.  Aortic Atherosclerosis (ICD10-I70.0). Electronically Signed   By: Sharlet Salina M.D.   On: 07/18/2020 23:00   CT ABDOMEN PELVIS W CONTRAST  Result Date: 07/18/2020 CLINICAL DATA:  Right chest wall pain radiating to back, COVID-08 June 2020, abdominal pain EXAM: CT ANGIOGRAPHY CHEST CT ABDOMEN AND PELVIS WITH CONTRAST TECHNIQUE: Multidetector CT imaging of the chest was performed using the standard protocol during bolus administration of intravenous contrast. Multiplanar CT image reconstructions and MIPs were obtained to evaluate the vascular anatomy. Multidetector CT imaging of the abdomen and pelvis was performed using the standard protocol during bolus administration of intravenous contrast. CONTRAST:  OMNIPAQUE IOHEXOL 350 MG/ML SOLN COMPARISON:  07/18/2020, 10/16/2018, 11/15/2016 FINDINGS: CTA CHEST FINDINGS Cardiovascular: This is a technically adequate evaluation of the pulmonary vasculature. No filling defects or pulmonary emboli. The heart is unremarkable without pericardial effusion. No evidence of thoracic aortic aneurysm or dissection. Mediastinum/Nodes: No enlarged mediastinal, hilar, or axillary lymph nodes. Thyroid gland, trachea, and esophagus demonstrate no significant findings. Lungs/Pleura: No acute airspace disease, effusion, or pneumothorax. There is a 6 x 5 mm right lower lobe pulmonary nodule image 74/7. No comparison imaging through this portion of the chest is available. Other small pulmonary nodules within the bilateral costophrenic angles are unchanged since 2018, largest measuring 3 mm in the right lower lobe image 94 and 4 mm in the  left lower lobe image 92. Given long-term stability, these are benign. Central airways are patent. Musculoskeletal: No acute or destructive bony lesions. Reconstructed images demonstrate no additional findings. Review of the MIP images confirms the above findings. CT ABDOMEN and PELVIS FINDINGS Hepatobiliary: No focal liver abnormality is seen. Status post  cholecystectomy. No biliary dilatation. Pancreas: Unremarkable. No pancreatic ductal dilatation or surrounding inflammatory changes. Spleen: Normal in size without focal abnormality. Adrenals/Urinary Tract: Adrenal glands are unremarkable. Kidneys are normal, without renal calculi, focal lesion, or hydronephrosis. Bladder is unremarkable. Stomach/Bowel: No bowel obstruction or ileus. Normal appendix right lower quadrant. No bowel wall thickening or inflammatory change. Vascular/Lymphatic: Aortic atherosclerosis. No enlarged abdominal or pelvic lymph nodes. Reproductive: Status post hysterectomy. No adnexal masses. Other: No free fluid or free gas.  No abdominal wall hernia. Musculoskeletal: No acute or destructive bony lesions. Reconstructed images demonstrate no additional findings. Review of the MIP images confirms the above findings. IMPRESSION: 1. No evidence of pulmonary embolus. 2. No acute intrathoracic, intra-abdominal, or intrapelvic process. 3. 5 mm mean diameter right lower lobe pulmonary nodule without comparison available. Numerous other bilateral lower lobe less than 4 mm nodules are stable since 2018. No follow-up needed if patient is low-risk (and has no known or suspected primary neoplasm). Non-contrast chest CT can be considered in 12 months if patient is high-risk. This recommendation follows the consensus statement: Guidelines for Management of Incidental Pulmonary Nodules Detected on CT Images: From the Fleischner Society 2017; Radiology 2017; 284:228-243. 4.  Aortic Atherosclerosis (ICD10-I70.0). Electronically Signed   By: Sharlet Salina  M.D.   On: 07/18/2020 23:00    Procedures Procedures   Medications Ordered in ED Medications  iohexol (OMNIPAQUE) 350 MG/ML injection 100 mL (100 mLs Intravenous Contrast Given 07/18/20 2235)  ketorolac (TORADOL) 30 MG/ML injection 15 mg (15 mg Intravenous Given 07/19/20 0013)    ED Course  I have reviewed the triage vital signs and the nursing notes.  Pertinent labs & imaging results that were available during my care of the patient were reviewed by me and considered in my medical decision making (see chart for details).    MDM Rules/Calculators/A&P                          Patient presents with right-sided chest pain and right upper abdominal pain. Patient is nontoxic appearing, afebrile, not tachycardic, not tachypneic, not hypotensive, maintains excellent SPO2 on room air.  I have reviewed the patient's chart to obtain more information.   I reviewed and interpreted the patient's labs and radiological studies. Low suspicion for ACS. HEART score is 2, indicating low risk for a cardiac event.  EKG without evidence of acute ischemia or pathologic/symptomatic arrhythmia.  Delta troponins negative. Wells criteria score is 0, indicating low risk for PE.   Dissection was considered, but thought less likely base on: History and description of the pain are not suggestive, patient is not ill-appearing, lack of risk factors, equal bilateral pulses, lack of neurologic deficits, no widened mediastinum on chest x-ray.  CT of the chest and abdomen without any acute abnormalities.  Lung nodules noted and discussed with the patient.  Patient was also provided with a printout of these results.  The patient was given instructions for home care as well as return precautions. Patient voices understanding of these instructions, accepts the plan, and is comfortable with discharge.  Vitals:   07/18/20 2113 07/18/20 2130 07/18/20 2212 07/19/20 0017  BP: (!) 155/96 (!) 167/99 (!) 157/76 (!) 167/86   Pulse: 71 70 71 66  Resp: Temp:      SpO2: 98% 100% 100% 100%  Weight:      Height:          Final Clinical Impression(s) /  ED Diagnoses Final diagnoses:  Right-sided chest pain    Rx / DC Orders ED Discharge Orders         Ordered    ibuprofen (ADVIL) 600 MG tablet  Every 6 hours PRN        07/18/20 2342    lidocaine (LIDODERM) 5 %  Every 24 hours        07/18/20 2342    methocarbamol (ROBAXIN) 500 MG tablet  2 times daily        07/18/20 2342           Concepcion Living 07/19/20 0036    Gerhard Munch, MD 07/19/20 [phone]

## 2020-07-18 NOTE — Discharge Instructions (Addendum)
Antiinflammatory medications: Take 600 mg of ibuprofen every 6 hours or 440 mg (over the counter dose) to 500 mg (prescription dose) of naproxen every 12 hours for the next 3 days. After this time, these medications may be used as needed for pain. Take these medications with food to avoid upset stomach. Choose only one of these medications, do not take them together. Acetaminophen (generic for Tylenol): Should you continue to have additional pain while taking the ibuprofen or naproxen, you may add in acetaminophen as needed. Your daily total maximum amount of acetaminophen from all sources should be limited to 4000mg /day for persons without liver problems, or 2000mg /day for those with liver problems. Lidocaine patches: These are available via either prescription or over-the-counter. The over-the-counter option may be more economical one and are likely just as effective. There are multiple over-the-counter brands, such as Salonpas. Methocarbamol: Methocarbamol (generic for Robaxin) is a muscle relaxer and can help relieve stiff muscles or muscle spasms.  Do not drive or perform other dangerous activities while taking this medication as it can cause drowsiness as well as changes in reaction time and judgement.  Follow-up with your primary care provider for any further management of this issue.

## 2020-07-21 DIAGNOSIS — R0781 Pleurodynia: Secondary | ICD-10-CM | POA: Diagnosis not present

## 2020-07-21 DIAGNOSIS — E6609 Other obesity due to excess calories: Secondary | ICD-10-CM | POA: Diagnosis not present

## 2020-07-21 DIAGNOSIS — Z6831 Body mass index (BMI) 31.0-31.9, adult: Secondary | ICD-10-CM | POA: Diagnosis not present

## 2020-08-02 NOTE — ED Provider Notes (Addendum)
[facility]    CSN: [id number] Arrival date & time: 07/18/20  1804      History   Chief Complaint Chief Complaint  Patient presents with  . Abdominal Pain    HPI [name] [name] is a 55 y.o. female.   HPI Patient presents today with right sided abdominal pain and right sided lateral chest wall pain. Patient reports pain radiates up and down and feels that it is encircling to the right mid and upper back. She has been taking OTC medication without relief of pain .   Medical history significant for GERD, Anxiety, COVID-19 sciatica neurologia. Current smoker.  She denies nausea, vomiting, or loose stools.Endorses shortness of breath related to pain with taking deep breaths. Denies diaphoresis.   Past Medical History:  Diagnosis Date  . Anxiety   . Bradycardia 03/02/2015  . Depression   . GERD (gastroesophageal reflux disease)   . History of hysterectomy   . History of oophorectomy   . Hx of cholecystectomy   . Hypertension   . IDA (iron deficiency anemia)   . Near syncope 03/02/2015  . Sciatica neuralgia, right   . Stroke [facility])    "light stroke 08/2015. No deficits  . Tobacco abuse 03/03/2015  . Vertigo     Patient Active Problem List   Diagnosis Date Noted  . Dyspepsia 11/24/2016  . Headache 09/07/2016  . Vision, loss, sudden, left 09/07/2016  . Left sided numbness 09/07/2016  . GERD (gastroesophageal reflux disease) 09/07/2016  . Vision loss, left eye 09/07/2016  . Snoring 04/19/2015  . Tobacco use disorder 04/19/2015  . Hypokalemia 03/03/2015  . Normocytic anemia 03/03/2015  . Tobacco abuse 03/03/2015  . Near syncope 03/02/2015  . Bradycardia 03/02/2015  . Pre-syncope 03/02/2015  . ANEMIA, IRON DEFICIENCY 08/01/2007  . RECTAL BLEEDING 08/01/2007  . DIARRHEA, CHRONIC 08/01/2007  . EPIGASTRIC PAIN 08/01/2007    Past Surgical History:  Procedure Laterality Date  . ABDOMINAL HYSTERECTOMY    . BIOPSY  12/04/2016   Procedure: BIOPSY;  Surgeon:  West Bali, MD;  Location: [facility];  Service: Endoscopy;;  gastric and duodenal biopsy  . BIOPSY  07/30/2017   Procedure: BIOPSY;  Surgeon: West Bali, MD;  Location: [facility];  Service: Endoscopy;;  random colon bx's  . CESAREAN SECTION     x2  . CHOLECYSTECTOMY    . COLONOSCOPY WITH ESOPHAGOGASTRODUODENOSCOPY (EGD)  2007   Dr. Juanda Chance: normal  . COLONOSCOPY WITH PROPOFOL N/A 07/30/2017   Dr. Darrick Penna: Redundant colon, external/internal hemorrhoids. Colon otherwise normal.  Random colon biopsies negative.  . ESOPHAGOGASTRODUODENOSCOPY (EGD) WITH PROPOFOL N/A 12/04/2016   Dr. Darrick Penna: Gastritis but no H.pylori, small bowel bx negative. gastric polyps benign fundic gland  . HEMORRHOID SURGERY      OB History   No obstetric history on file.      Home Medications    Prior to Admission medications   Medication Sig Start Date End Date Taking? Authorizing Provider  ADDERALL XR 30 MG 24 hr capsule Take 30 mg by mouth daily as needed. For focusing while at work 11/03/16   [provider]  ALPRAZolam Prudy Feeler) 0.5 MG tablet Take 0.5 mg by mouth 2 (two) times daily as needed for anxiety.    [provider]  hydrochlorothiazide (MICROZIDE) 12.5 MG capsule Take 12.5 mg by mouth daily. For fluid retention/swelling. 11/13/16   [provider]  hydrocortisone (ANUSOL-HC) 2.5 % rectal cream Place 1 application rectally 2 (two) times daily.  For up to 10 days at a time as needed. 06/03/19   Tiffany Kocher, PA-C  ibuprofen (ADVIL) 600 MG tablet Take 1 tablet (600 mg total) by mouth every 6 (six) hours as needed. 07/18/20   Joy, Shawn C, PA-C  lidocaine (LIDODERM) 5 % Place 1 patch onto the skin daily. Remove & Discard patch within 12 hours or as directed by MD 07/18/20   Harolyn Rutherford C, PA-C  methocarbamol (ROBAXIN) 500 MG tablet Take 1 tablet (500 mg total) by mouth 2 (two) times daily. 07/18/20   Joy, Shawn C, PA-C  pantoprazole (PROTONIX) 40 MG tablet TAKE 1 TABLET (40  MG TOTAL) BY MOUTH 2 (TWO) TIMES DAILY BEFORE A MEAL. 07/21/18   Gelene Mink, NP    Family History Family History  Problem Relation Age of Onset  . ALS Mother   . Cardiomyopathy Father   . Colon cancer Neg Hx     Social History Social History   Tobacco Use  . Smoking status: Current Every Day Smoker    Packs/day: 0.10    Years: 20.00    Pack years: 2.00    Types: Cigarettes  . Smokeless tobacco: Never Used  Substance Use Topics  . Alcohol use: Yes    Alcohol/week: 0.0 standard drinks    Comment: seldom  . Drug use: Yes    Frequency: 2.0 times per week    Types: Marijuana    Comment: marijuana maybe once a week     Allergies   Sulfa antibiotics   Review of Systems Review of Systems Pertinent negatives listed in HPI  Physical Exam Triage Vital Signs ED Triage Vitals  Enc Vitals Group     BP 07/18/20 1821 (!) 159/83     Pulse Rate 07/18/20 1821 77     Resp 07/18/20 1821 18     Temp 07/18/20 1821 98.3 F (36.8 C)     Temp Source 07/18/20 1821 Oral     SpO2 07/18/20 1821 96 %     Weight --      Height --      Head Circumference --      Peak Flow --      Pain Score 07/18/20 1819 10     Pain Loc --      Pain Edu? --      Excl. in GC? --    No data found.  Updated Vital Signs BP (!) 159/83 (BP Location: Left Arm)   Pulse 77   Temp 98.3 F (36.8 C) (Oral)   Resp 18   LMP 05/21/1997   SpO2 96%   Visual Acuity Right Eye Distance:   Left Eye Distance:   Bilateral Distance:    Right Eye Near:   Left Eye Near:    Bilateral Near:     Physical Exam Cardiovascular:     Rate and Rhythm: Normal rate and regular rhythm.     Pulses: Normal pulses.     Heart sounds: Normal heart sounds.  Pulmonary:     Effort: Tachypnea present.     Breath sounds: Normal breath sounds and air entry.  Chest:     Chest wall: Tenderness present.    Musculoskeletal:     Right lower leg: No edema.     Left lower leg: No edema.  Neurological:     General: No focal  deficit present.     GCS: GCS eye subscore is 4. GCS verbal subscore is 5. GCS motor subscore is 6.  Sensory: Sensation is intact.     Motor: Motor function is intact.     Coordination: Coordination is intact.     Gait: Gait is intact.  Psychiatric:        Attention and Perception: Attention and perception normal.        Mood and Affect: Mood normal.        Speech: Speech normal.     UC Treatments / Results  Labs (all labs ordered are listed, but only abnormal results are displayed) Labs Reviewed - No data to display  EKG   Radiology No results found.  Procedures Procedures (including critical care time)  Medications Ordered in UC Medications - No data to display  Initial Impression / Assessment and Plan / UC Course  I have reviewed the triage vital signs and the nursing notes.  Pertinent labs & imaging results that were available during my care of the patient were reviewed by me and considered in my medical decision making (see chart for details).     Given limitations of urgent care setting, patient warrants higher level of care due to the degree of abdominal and atypical chest pain exacerbated by breathing. Patient warrants further diagnostic work-up including imaging to rule out a PE or acute cardiac problem. Patient given Toradol 30 mg IM here in clinic prior to discharge to ER  Final Clinical Impressions(s) / UC Diagnoses   Final diagnoses:  Atypical chest pain  Right-sided chest pain  Chest pain varying with breathing  History of COVID-19  Right upper quadrant abdominal pain     Discharge Instructions     Go immediately to the Emergency department for further evaluation of source of your chest pain. Chest x-ray on 2/21 negative for pneumonia and doesn't explain the severity of your pain here today. You warranted further diagnotic evaluation to rule out blood clot in the lung as a source of pain upper right chest.    ED Prescriptions    None      PDMP not reviewed this encounter.   Bing Neighbors, FNP 08/02/20 [phone]    Bing Neighbors, FNP 08/03/20 [phone]

## 2020-08-02 NOTE — ED Provider Notes (Incomplete)
[facility]    CSN: [id number] Arrival date & time: 07/18/20  1804      History   Chief Complaint Chief Complaint  Patient presents with  . Abdominal Pain    HPI [name] [name] is a 55 y.o. female.   HPI  Past Medical History:  Diagnosis Date  . Anxiety   . Bradycardia 03/02/2015  . Depression   . GERD (gastroesophageal reflux disease)   . History of hysterectomy   . History of oophorectomy   . Hx of cholecystectomy   . Hypertension   . IDA (iron deficiency anemia)   . Near syncope 03/02/2015  . Sciatica neuralgia, right   . Stroke [facility])    "light stroke 08/2015. No deficits  . Tobacco abuse 03/03/2015  . Vertigo     Patient Active Problem List   Diagnosis Date Noted  . Dyspepsia 11/24/2016  . Headache 09/07/2016  . Vision, loss, sudden, left 09/07/2016  . Left sided numbness 09/07/2016  . GERD (gastroesophageal reflux disease) 09/07/2016  . Vision loss, left eye 09/07/2016  . Snoring 04/19/2015  . Tobacco use disorder 04/19/2015  . Hypokalemia 03/03/2015  . Normocytic anemia 03/03/2015  . Tobacco abuse 03/03/2015  . Near syncope 03/02/2015  . Bradycardia 03/02/2015  . Pre-syncope 03/02/2015  . ANEMIA, IRON DEFICIENCY 08/01/2007  . RECTAL BLEEDING 08/01/2007  . DIARRHEA, CHRONIC 08/01/2007  . EPIGASTRIC PAIN 08/01/2007    Past Surgical History:  Procedure Laterality Date  . ABDOMINAL HYSTERECTOMY    . BIOPSY  12/04/2016   Procedure: BIOPSY;  Surgeon: West Bali, MD;  [facility]: [facility];  Service: Endoscopy;;  gastric and duodenal biopsy  . BIOPSY  07/30/2017   Procedure: BIOPSY;  Surgeon: West Bali, MD;  [facility]: [facility];  Service: Endoscopy;;  random colon bx's  . CESAREAN SECTION     x2  . CHOLECYSTECTOMY    . COLONOSCOPY WITH ESOPHAGOGASTRODUODENOSCOPY (EGD)  2007   Dr. Juanda Chance: normal  . COLONOSCOPY WITH PROPOFOL N/A 07/30/2017   Dr. Darrick Penna: Redundant colon, external/internal hemorrhoids. Colon otherwise  normal.  Random colon biopsies negative.  . ESOPHAGOGASTRODUODENOSCOPY (EGD) WITH PROPOFOL N/A 12/04/2016   Dr. Darrick Penna: Gastritis but no H.pylori, small bowel bx negative. gastric polyps benign fundic gland  . HEMORRHOID SURGERY      OB History   No obstetric history on file.      Home Medications    Prior to Admission medications   Medication Sig Start Date End Date Taking? Authorizing Provider  ADDERALL XR 30 MG 24 hr capsule Take 30 mg by mouth daily as needed. For focusing while at work 11/03/16   [provider]  ALPRAZolam Prudy Feeler) 0.5 MG tablet Take 0.5 mg by mouth 2 (two) times daily as needed for anxiety.    [provider]  hydrochlorothiazide (MICROZIDE) 12.5 MG capsule Take 12.5 mg by mouth daily. For fluid retention/swelling. 11/13/16   [provider]  hydrocortisone (ANUSOL-HC) 2.5 % rectal cream Place 1 application rectally 2 (two) times daily. For up to 10 days at a time as needed. 06/03/19   Tiffany Kocher, PA-C  ibuprofen (ADVIL) 600 MG tablet Take 1 tablet (600 mg total) by mouth every 6 (six) hours as needed. 07/18/20   Joy, Shawn C, PA-C  lidocaine (LIDODERM) 5 % Place 1 patch onto the skin daily. Remove & Discard patch within 12 hours or as directed by MD 07/18/20   Harolyn Rutherford C, PA-C  methocarbamol (ROBAXIN) 500 MG  tablet Take 1 tablet (500 mg total) by mouth 2 (two) times daily. 07/18/20   Joy, Shawn C, PA-C  pantoprazole (PROTONIX) 40 MG tablet TAKE 1 TABLET (40 MG TOTAL) BY MOUTH 2 (TWO) TIMES DAILY BEFORE A MEAL. 07/21/18   Gelene Mink, NP    Family History Family History  Problem Relation Age of Onset  . ALS Mother   . Cardiomyopathy Father   . Colon cancer Neg Hx     Social History Social History   Tobacco Use  . Smoking status: Current Every Day Smoker    Packs/day: 0.10    Years: 20.00    Pack years: 2.00    Types: Cigarettes  . Smokeless tobacco: Never Used  Substance Use Topics  . Alcohol use: Yes    Alcohol/week: 0.0  standard drinks    Comment: seldom  . Drug use: Yes    Frequency: 2.0 times per week    Types: Marijuana    Comment: marijuana maybe once a week     Allergies   Sulfa antibiotics   Review of Systems Review of Systems   Physical Exam Triage Vital Signs ED Triage Vitals  Enc Vitals Group     BP 07/18/20 1821 (!) 159/83     Pulse Rate 07/18/20 1821 77     Resp 07/18/20 1821 18     Temp 07/18/20 1821 98.3 F (36.8 C)     Temp Source 07/18/20 1821 Oral     SpO2 07/18/20 1821 96 %     Weight --      Height --      Head Circumference --      Peak Flow --      Pain Score 07/18/20 1819 10     Pain Loc --      Pain Edu? --      Excl. in GC? --    No data found.  Updated Vital Signs BP (!) 159/83 (BP Location: Left Arm)   Pulse 77   Temp 98.3 F (36.8 C) (Oral)   Resp 18   LMP 05/21/1997   SpO2 96%   Visual Acuity Right Eye Distance:   Left Eye Distance:   Bilateral Distance:    Right Eye Near:   Left Eye Near:    Bilateral Near:     Physical Exam   UC Treatments / Results  Labs (all labs ordered are listed, but only abnormal results are displayed) Labs Reviewed - No data to display  EKG   Radiology No results found.  Procedures Procedures (including critical care time)  Medications Ordered in UC Medications - No data to display  Initial Impression / Assessment and Plan / UC Course  I have reviewed the triage vital signs and the nursing notes.  Pertinent labs & imaging results that were available during my care of the patient were reviewed by me and considered in my medical decision making (see chart for details).     *** Final Clinical Impressions(s) / UC Diagnoses   Final diagnoses:  Atypical chest pain  Right-sided chest pain  Chest pain varying with breathing  History of COVID-19     Discharge Instructions     Go immediately to the Emergency department for further evaluation of source of your chest pain. Chest x-ray on 2/21  negative for pneumonia and doesn't explain the severity of your pain here today. You warranted further diagnotic evaluation to rule out blood clot in the lung as a source of pain upper  right chest.    ED Prescriptions    None     PDMP not reviewed this encounter.

## 2020-08-02 NOTE — ED Provider Notes (Incomplete Revision)
[facility]    CSN: [id number] Arrival date & time: 07/18/20  1804      History   Chief Complaint Chief Complaint  Patient presents with  . Abdominal Pain    HPI [name] [name] is a 55 y.o. female.   HPI Patient presents today with right sided abdominal pain and right sided lateral chest wall pain. Patient reports pain radiates up and down and feels that it is encircling to the right mid and upper back. She has been taking OTC medication without relief of pain .   Medical history significant for GERD, Anxiety, COVID-19 sciatica neurologia. Current smoker.  She denies nausea, vomiting, or loose stools.Endorses shortness of breath related to pain with taking deep breaths. Denies diaphoresis.   Past Medical History:  Diagnosis Date  . Anxiety   . Bradycardia 03/02/2015  . Depression   . GERD (gastroesophageal reflux disease)   . History of hysterectomy   . History of oophorectomy   . Hx of cholecystectomy   . Hypertension   . IDA (iron deficiency anemia)   . Near syncope 03/02/2015  . Sciatica neuralgia, right   . Stroke [facility])    "light stroke 08/2015. No deficits  . Tobacco abuse 03/03/2015  . Vertigo     Patient Active Problem List   Diagnosis Date Noted  . Dyspepsia 11/24/2016  . Headache 09/07/2016  . Vision, loss, sudden, left 09/07/2016  . Left sided numbness 09/07/2016  . GERD (gastroesophageal reflux disease) 09/07/2016  . Vision loss, left eye 09/07/2016  . Snoring 04/19/2015  . Tobacco use disorder 04/19/2015  . Hypokalemia 03/03/2015  . Normocytic anemia 03/03/2015  . Tobacco abuse 03/03/2015  . Near syncope 03/02/2015  . Bradycardia 03/02/2015  . Pre-syncope 03/02/2015  . ANEMIA, IRON DEFICIENCY 08/01/2007  . RECTAL BLEEDING 08/01/2007  . DIARRHEA, CHRONIC 08/01/2007  . EPIGASTRIC PAIN 08/01/2007    Past Surgical History:  Procedure Laterality Date  . ABDOMINAL HYSTERECTOMY    . BIOPSY  12/04/2016   Procedure: BIOPSY;  Surgeon:  West Bali, MD;  [facility];  Service: Endoscopy;;  gastric and duodenal biopsy  . BIOPSY  07/30/2017   Procedure: BIOPSY;  Surgeon: West Bali, MD;  [facility]: [facility];  Service: Endoscopy;;  random colon bx's  . CESAREAN SECTION     x2  . CHOLECYSTECTOMY    . COLONOSCOPY WITH ESOPHAGOGASTRODUODENOSCOPY (EGD)  2007   Dr. Juanda Chance: normal  . COLONOSCOPY WITH PROPOFOL N/A 07/30/2017   Dr. Darrick Penna: Redundant colon, external/internal hemorrhoids. Colon otherwise normal.  Random colon biopsies negative.  . ESOPHAGOGASTRODUODENOSCOPY (EGD) WITH PROPOFOL N/A 12/04/2016   Dr. Darrick Penna: Gastritis but no H.pylori, small bowel bx negative. gastric polyps benign fundic gland  . HEMORRHOID SURGERY      OB History   No obstetric history on file.      Home Medications    Prior to Admission medications   Medication Sig Start Date End Date Taking? Authorizing Provider  ADDERALL XR 30 MG 24 hr capsule Take 30 mg by mouth daily as needed. For focusing while at work 11/03/16   [provider]  ALPRAZolam Prudy Feeler) 0.5 MG tablet Take 0.5 mg by mouth 2 (two) times daily as needed for anxiety.    [provider]  hydrochlorothiazide (MICROZIDE) 12.5 MG capsule Take 12.5 mg by mouth daily. For fluid retention/swelling. 11/13/16   [provider]  hydrocortisone (ANUSOL-HC) 2.5 % rectal cream Place 1 application rectally 2 (two) times daily.  For up to 10 days at a time as needed. 06/03/19   Tiffany Kocher, PA-C  ibuprofen (ADVIL) 600 MG tablet Take 1 tablet (600 mg total) by mouth every 6 (six) hours as needed. 07/18/20   Joy, Shawn C, PA-C  lidocaine (LIDODERM) 5 % Place 1 patch onto the skin daily. Remove & Discard patch within 12 hours or as directed by MD 07/18/20   Harolyn Rutherford C, PA-C  methocarbamol (ROBAXIN) 500 MG tablet Take 1 tablet (500 mg total) by mouth 2 (two) times daily. 07/18/20   Joy, Shawn C, PA-C  pantoprazole (PROTONIX) 40 MG tablet TAKE 1 TABLET (40  MG TOTAL) BY MOUTH 2 (TWO) TIMES DAILY BEFORE A MEAL. 07/21/18   Gelene Mink, NP    Family History Family History  Problem Relation Age of Onset  . ALS Mother   . Cardiomyopathy Father   . Colon cancer Neg Hx     Social History Social History   Tobacco Use  . Smoking status: Current Every Day Smoker    Packs/day: 0.10    Years: 20.00    Pack years: 2.00    Types: Cigarettes  . Smokeless tobacco: Never Used  Substance Use Topics  . Alcohol use: Yes    Alcohol/week: 0.0 standard drinks    Comment: seldom  . Drug use: Yes    Frequency: 2.0 times per week    Types: Marijuana    Comment: marijuana maybe once a week     Allergies   Sulfa antibiotics   Review of Systems Review of Systems Pertinent negatives listed in HPI  Physical Exam Triage Vital Signs ED Triage Vitals  Enc Vitals Group     BP 07/18/20 1821 (!) 159/83     Pulse Rate 07/18/20 1821 77     Resp 07/18/20 1821 18     Temp 07/18/20 1821 98.3 F (36.8 C)     Temp Source 07/18/20 1821 Oral     SpO2 07/18/20 1821 96 %     Weight --      Height --      Head Circumference --      Peak Flow --      Pain Score 07/18/20 1819 10     Pain Loc --      Pain Edu? --      Excl. in GC? --    No data found.  Updated Vital Signs BP (!) 159/83 (BP Location: Left Arm)   Pulse 77   Temp 98.3 F (36.8 C) (Oral)   Resp 18   LMP 05/21/1997   SpO2 96%   Visual Acuity Right Eye Distance:   Left Eye Distance:   Bilateral Distance:    Right Eye Near:   Left Eye Near:    Bilateral Near:     Physical Exam Cardiovascular:     Rate and Rhythm: Normal rate and regular rhythm.     Pulses: Normal pulses.     Heart sounds: Normal heart sounds.  Pulmonary:     Effort: Tachypnea present.     Breath sounds: Normal breath sounds and air entry.  Chest:     Chest wall: Tenderness present.    Musculoskeletal:     Right lower leg: No edema.     Left lower leg: No edema.  Neurological:     General: No focal  deficit present.     GCS: GCS eye subscore is 4. GCS verbal subscore is 5. GCS motor subscore is 6.  Sensory: Sensation is intact.     Motor: Motor function is intact.     Coordination: Coordination is intact.     Gait: Gait is intact.  Psychiatric:        Attention and Perception: Attention and perception normal.        Mood and Affect: Mood normal.        Speech: Speech normal.     UC Treatments / Results  Labs (all labs ordered are listed, but only abnormal results are displayed) Labs Reviewed - No data to display  EKG   Radiology No results found.  Procedures Procedures (including critical care time)  Medications Ordered in UC Medications - No data to display  Initial Impression / Assessment and Plan / UC Course  I have reviewed the triage vital signs and the nursing notes.  Pertinent labs & imaging results that were available during my care of the patient were reviewed by me and considered in my medical decision making (see chart for details).     Given limitations of urgent care setting, patient warrants higher level of care due to the degree of abdominal and atypical chest pain exacerbated by breathing. Patient warrants further diagnostic work-up including imaging to rule out a PE or acute cardiac problem. Patient given Toradol 30 mg IM here in clinic prior to discharge to ER  Final Clinical Impressions(s) / UC Diagnoses   Final diagnoses:  Atypical chest pain  Right-sided chest pain  Chest pain varying with breathing  History of COVID-19  Right upper quadrant abdominal pain     Discharge Instructions     Go immediately to the Emergency department for further evaluation of source of your chest pain. Chest x-ray on 2/21 negative for pneumonia and doesn't explain the severity of your pain here today. You warranted further diagnotic evaluation to rule out blood clot in the lung as a source of pain upper right chest.    ED Prescriptions    None      PDMP not reviewed this encounter.   Bing Neighbors, [place] 08/02/20 [phone]

## 2020-09-05 ENCOUNTER — Ambulatory Visit: Payer: BC Managed Care – PPO | Admitting: Internal Medicine

## 2020-09-19 ENCOUNTER — Ambulatory Visit: Payer: BC Managed Care – PPO | Admitting: Internal Medicine

## 2020-09-19 ENCOUNTER — Other Ambulatory Visit: Payer: Self-pay

## 2020-09-19 VITALS — BP 120/86 | HR 97 | Temp 98.0°F | Resp 18 | Ht 62.0 in | Wt 173.8 lb

## 2020-09-19 DIAGNOSIS — Z7689 Persons encountering health services in other specified circumstances: Secondary | ICD-10-CM | POA: Diagnosis not present

## 2020-09-19 DIAGNOSIS — M25511 Pain in right shoulder: Secondary | ICD-10-CM | POA: Diagnosis not present

## 2020-09-19 DIAGNOSIS — R600 Localized edema: Secondary | ICD-10-CM

## 2020-09-19 DIAGNOSIS — R911 Solitary pulmonary nodule: Secondary | ICD-10-CM

## 2020-09-19 DIAGNOSIS — E669 Obesity, unspecified: Secondary | ICD-10-CM | POA: Diagnosis not present

## 2020-09-19 DIAGNOSIS — K219 Gastro-esophageal reflux disease without esophagitis: Secondary | ICD-10-CM | POA: Diagnosis not present

## 2020-09-19 DIAGNOSIS — K529 Noninfective gastroenteritis and colitis, unspecified: Secondary | ICD-10-CM | POA: Diagnosis not present

## 2020-09-19 DIAGNOSIS — Z6830 Body mass index (BMI) 30.0-30.9, adult: Secondary | ICD-10-CM

## 2020-09-19 DIAGNOSIS — F988 Other specified behavioral and emotional disorders with onset usually occurring in childhood and adolescence: Secondary | ICD-10-CM | POA: Insufficient documentation

## 2020-09-19 DIAGNOSIS — F900 Attention-deficit hyperactivity disorder, predominantly inattentive type: Secondary | ICD-10-CM

## 2020-09-19 DIAGNOSIS — E66811 Obesity, class 1: Secondary | ICD-10-CM

## 2020-09-19 DIAGNOSIS — F419 Anxiety disorder, unspecified: Secondary | ICD-10-CM

## 2020-09-19 NOTE — Assessment & Plan Note (Signed)
Care established Previous chart reviewed History and medications reviewed with the patient 

## 2020-09-19 NOTE — Assessment & Plan Note (Signed)
On Xanax PRN Follows up with Psychiatry - Dr Evelene Croon

## 2020-09-19 NOTE — Assessment & Plan Note (Signed)
H/o preeclampsia On HCTZ 12.5 mg QD

## 2020-09-19 NOTE — Assessment & Plan Note (Signed)
H/o gastritis On Pantoprazole 40 mg BID

## 2020-09-19 NOTE — Assessment & Plan Note (Signed)
Last CT chest reviewed - 5 mm right pulmonary nodule along with multiple small nodules b/l Patient recently quit smoking. Will repeat CT chest in next year.

## 2020-09-19 NOTE — Progress Notes (Signed)
New Patient Office Visit  Subjective:  Patient ID: [name] [name], female    DOB: 05/04/1966  Age: 55 y.o. MRN: [id number]  CC:  Chief Complaint  Patient presents with  . New Patient (Initial Visit)    New patient was a pt at [facility] a few weeks ago and right shoulder and side has been hurting     HPI [name] [name] is a 55 year old female with PMH of GERD/gastritis, ADD with anxiety and obesity who presents for establishing care. She is a former patient of [facility].  She had a mechanical fall about 2 weeks ago, when she fell over right side of her body. She has been having right shoulder pain, which is constant, worse with movement and nonradiating.  She follows up with Psychiatry for ADD and anxiety. Denies anhedonia, SI or HI.  She reports h/o preeclampsia and has been on HCTZ since then. Denies h/o HTN. Has intermittent leg edema.  She has h/o GERD/gastritis, for which she takes Pantoprazole. She also has chronic loose BM - chart review suggests h/o lactose intolerance and IBS-D. She has not been taking Lactase.  She is concerned about her weight and asked about Saxenda. Patient is counseled about diet modification and exercises first. She is willing to do it for now.  She has had COVID vaccines, but does not have card with her today. She recently quit smoking.  Past Medical History:  Diagnosis Date  . Allergy    Phreesia 09/19/2020  . Anxiety   . Bradycardia 03/02/2015  . Depression   . GERD (gastroesophageal reflux disease)   . History of hysterectomy   . History of oophorectomy   . Hx of cholecystectomy   . Hypertension   . IDA (iron deficiency anemia)   . Near syncope 03/02/2015  . Sciatica neuralgia, right   . Stroke [facility])    "light stroke 08/2015. No deficits  . Tobacco abuse 03/03/2015  . Vertigo     Past Surgical History:  Procedure Laterality Date  . ABDOMINAL HYSTERECTOMY    . BIOPSY  12/04/2016   Procedure: BIOPSY;  Surgeon: West Bali, MD;  Location: [facility];  Service: Endoscopy;;  gastric and duodenal biopsy  . BIOPSY  07/30/2017   Procedure: BIOPSY;  Surgeon: West Bali, MD;  Location: [facility];  Service: Endoscopy;;  random colon bx's  . CESAREAN SECTION     x2  . CESAREAN SECTION N/A    Phreesia 09/19/2020  . CHOLECYSTECTOMY    . COLONOSCOPY WITH ESOPHAGOGASTRODUODENOSCOPY (EGD)  2007   Dr. Juanda Chance: normal  . COLONOSCOPY WITH PROPOFOL N/A 07/30/2017   Dr. Darrick Penna: Redundant colon, external/internal hemorrhoids. Colon otherwise normal.  Random colon biopsies negative.  . ESOPHAGOGASTRODUODENOSCOPY (EGD) WITH PROPOFOL N/A 12/04/2016   Dr. Darrick Penna: Gastritis but no H.pylori, small bowel bx negative. gastric polyps benign fundic gland  . HEMORRHOID SURGERY      Family History  Problem Relation Age of Onset  . ALS Mother   . Cardiomyopathy Father   . Colon cancer Neg Hx     Social History   Socioeconomic History  . Marital status: Married    Spouse name: Not on file  . Number of children: Not on file  . Years of education: Not on file  . Highest education level: Not on file  Occupational History  . Not on file  Tobacco Use  . Smoking status: Current Every Day Smoker    Packs/day: 0.10  Years: 20.00    Pack years: 2.00    Types: Cigarettes  . Smokeless tobacco: Never Used  Substance and Sexual Activity  . Alcohol use: Yes    Alcohol/week: 0.0 standard drinks    Comment: seldom  . Drug use: Yes    Frequency: 2.0 times per week    Types: Marijuana    Comment: marijuana maybe once a week  . Sexual activity: Yes    Birth control/protection: Surgical  Other Topics Concern  . Not on file  Social History Narrative   Drinks 5-6 cups of coffee daily.   Social Determinants of Health   Financial Resource Strain: Not on file  Food Insecurity: Not on file  Transportation Needs: Not on file  Physical Activity: Not on file  Stress: Not on file  Social Connections: Not on file   Intimate Partner Violence: Not on file    ROS Review of Systems  Constitutional: Negative for chills and fever.  HENT: Negative for congestion, sinus pressure, sinus pain and sore throat.   Eyes: Negative for pain and discharge.  Respiratory: Negative for cough and shortness of breath.   Cardiovascular: Negative for chest pain and palpitations.  Gastrointestinal: Negative for abdominal pain, constipation, diarrhea, nausea and vomiting.  Endocrine: Negative for polydipsia and polyuria.  Genitourinary: Negative for dysuria and hematuria.  Musculoskeletal: Positive for arthralgias. Negative for neck pain and neck stiffness.  Skin: Negative for rash.  Neurological: Negative for dizziness and weakness.  Psychiatric/Behavioral: Negative for agitation and behavioral problems.    Objective:   Today's Vitals: BP 120/86 (BP Location: Left Arm, Patient Position: Sitting, Cuff Size: Normal)   Pulse 97   Temp 98 F (36.7 C) (Oral)   Resp 18   Ht 5\' 2"  (1.575 m)   Wt 173 lb 12.8 oz (78.8 kg)   LMP 05/21/1997   SpO2 100%   BMI 31.79 kg/m   Physical Exam Vitals reviewed.  Constitutional:      General: She is not in acute distress.    Appearance: She is not diaphoretic.  HENT:     Head: Normocephalic and atraumatic.     Nose: Nose normal.     Mouth/Throat:     Mouth: Mucous membranes are moist.  Eyes:     General: No scleral icterus.    Extraocular Movements: Extraocular movements intact.  Cardiovascular:     Rate and Rhythm: Normal rate and regular rhythm.     Pulses: Normal pulses.     Heart sounds: Normal heart sounds. No murmur heard.   Pulmonary:     Breath sounds: Normal breath sounds. No wheezing or rales.  Musculoskeletal:     Cervical back: Neck supple. No tenderness.     Right lower leg: No edema.     Left lower leg: No edema.     Comments: ROM at right shoulder limited due to pain  Skin:    General: Skin is warm.     Findings: No rash.  Neurological:      General: No focal deficit present.     Mental Status: She is alert and oriented to person, place, and time.  Psychiatric:        Mood and Affect: Mood normal.        Behavior: Behavior normal.     Assessment & Plan:   Problem List Items Addressed This Visit      Encounter to establish care - Primary   Care established Previous chart reviewed History and medications reviewed with  the patient      Digestive   Chronic diarrhea    Chart review suggests Lactose intolerance. Advised to take Lactase PRN Follow up with GI      GERD (gastroesophageal reflux disease)    H/o gastritis On Pantoprazole 40 mg BID        Other   Obesity Diet modification and moderate exercise advised If persistent, will discuss medical treatment.      ADD (attention deficit disorder)    On Adderall Follows up with Psychiatrist      Anxiety    On Xanax PRN Follows up with Psychiatry - Dr Evelene Croon      Leg edema    H/o preeclampsia On HCTZ 12.5 mg QD      Pulmonary nodule    Last CT chest reviewed - 5 mm right pulmonary nodule along with multiple small nodules b/l Patient recently quit smoking. Will repeat CT chest in next year.       Other Visit Diagnoses    Acute pain of right shoulder     S/p mechanical fall, will check X-ray right shoulder Rest, ice pack/heating pad for now Tylenol/Ibuprofen PRN    Relevant Orders   DG Shoulder Right      Outpatient Encounter Medications as of 09/19/2020  Medication Sig  . ADDERALL XR 30 MG 24 hr capsule Take 30 mg by mouth daily as needed. For focusing while at work  . ALPRAZolam (XANAX) 0.5 MG tablet Take 0.5 mg by mouth 2 (two) times daily as needed for anxiety.  . cetirizine (ZYRTEC) 10 MG tablet Take 10 mg by mouth daily.  . fluticasone (FLONASE) 50 MCG/ACT nasal spray Place 1 spray into both nostrils 2 (two) times daily.  . hydrochlorothiazide (MICROZIDE) 12.5 MG capsule Take 12.5 mg by mouth daily. For fluid retention/swelling.  Marland Kitchen  ibuprofen (ADVIL) 600 MG tablet Take 1 tablet (600 mg total) by mouth every 6 (six) hours as needed.  . pantoprazole (PROTONIX) 40 MG tablet TAKE 1 TABLET (40 MG TOTAL) BY MOUTH 2 (TWO) TIMES DAILY BEFORE A MEAL.  Marland Kitchen lidocaine (LIDODERM) 5 % Place 1 patch onto the skin daily. Remove & Discard patch within 12 hours or as directed by MD (Patient not taking: Reported on 09/19/2020)  . [DISCONTINUED] hydrocortisone (ANUSOL-HC) 2.5 % rectal cream Place 1 application rectally 2 (two) times daily. For up to 10 days at a time as needed. (Patient not taking: Reported on 09/19/2020)  . [DISCONTINUED] methocarbamol (ROBAXIN) 500 MG tablet Take 1 tablet (500 mg total) by mouth 2 (two) times daily. (Patient not taking: Reported on 09/19/2020)   No facility-administered encounter medications on file as of 09/19/2020.    Follow-up: Return in about 4 months (around 01/20/2021) for GERD, weight check.   Anabel Halon, MD

## 2020-09-19 NOTE — Assessment & Plan Note (Signed)
Diet modification and moderate exercise advised If persistent, will discuss medical treatment.

## 2020-09-19 NOTE — Assessment & Plan Note (Signed)
Chart review suggests Lactose intolerance. Advised to take Lactase PRN Follow up with GI

## 2020-09-19 NOTE — Patient Instructions (Signed)
Please continue taking medications as prescribed.  Please get X-ray of the right shoulder done.  Please follow low carb diet and perform moderate exercise/walking at least 150 mins/week.

## 2020-09-19 NOTE — Assessment & Plan Note (Signed)
On Adderall Follows up with Psychiatrist

## 2020-10-05 ENCOUNTER — Ambulatory Visit (HOSPITAL_COMMUNITY)
Admission: RE | Admit: 2020-10-05 | Discharge: 2020-10-05 | Disposition: A | Discharge status: Home / Self Care | Payer: BC Managed Care – PPO | Source: Ambulatory Visit | Attending: Internal Medicine | Admitting: Internal Medicine

## 2020-10-05 ENCOUNTER — Other Ambulatory Visit: Payer: Self-pay

## 2020-10-05 DIAGNOSIS — M25511 Pain in right shoulder: Secondary | ICD-10-CM | POA: Insufficient documentation

## 2020-10-19 DIAGNOSIS — F900 Attention-deficit hyperactivity disorder, predominantly inattentive type: Secondary | ICD-10-CM | POA: Diagnosis not present

## 2020-10-19 DIAGNOSIS — F4310 Post-traumatic stress disorder, unspecified: Secondary | ICD-10-CM | POA: Diagnosis not present

## 2020-11-25 ENCOUNTER — Ambulatory Visit: Payer: BC Managed Care – PPO | Admitting: Nurse Practitioner

## 2021-01-30 ENCOUNTER — Encounter: Payer: Self-pay | Admitting: Internal Medicine

## 2021-01-30 ENCOUNTER — Other Ambulatory Visit: Payer: Self-pay

## 2021-01-30 ENCOUNTER — Ambulatory Visit: Payer: BC Managed Care – PPO | Admitting: Internal Medicine

## 2021-01-30 VITALS — BP 152/75 | HR 107 | Temp 98.6°F | Resp 18 | Ht 62.0 in | Wt 168.1 lb

## 2021-01-30 DIAGNOSIS — Z131 Encounter for screening for diabetes mellitus: Secondary | ICD-10-CM

## 2021-01-30 DIAGNOSIS — E66811 Obesity, class 1: Secondary | ICD-10-CM

## 2021-01-30 DIAGNOSIS — J309 Allergic rhinitis, unspecified: Secondary | ICD-10-CM | POA: Insufficient documentation

## 2021-01-30 DIAGNOSIS — E559 Vitamin D deficiency, unspecified: Secondary | ICD-10-CM

## 2021-01-30 DIAGNOSIS — I10 Essential (primary) hypertension: Secondary | ICD-10-CM

## 2021-01-30 DIAGNOSIS — K219 Gastro-esophageal reflux disease without esophagitis: Secondary | ICD-10-CM | POA: Diagnosis not present

## 2021-01-30 DIAGNOSIS — F419 Anxiety disorder, unspecified: Secondary | ICD-10-CM | POA: Diagnosis not present

## 2021-01-30 DIAGNOSIS — J3089 Other allergic rhinitis: Secondary | ICD-10-CM

## 2021-01-30 DIAGNOSIS — E669 Obesity, unspecified: Secondary | ICD-10-CM

## 2021-01-30 DIAGNOSIS — R600 Localized edema: Secondary | ICD-10-CM | POA: Diagnosis not present

## 2021-01-30 DIAGNOSIS — Z1159 Encounter for screening for other viral diseases: Secondary | ICD-10-CM

## 2021-01-30 DIAGNOSIS — E782 Mixed hyperlipidemia: Secondary | ICD-10-CM

## 2021-01-30 MED ORDER — LOSARTAN POTASSIUM-HCTZ 50-12.5 MG PO TABS: 1.0000 | ORAL_TABLET | Freq: Every day | ORAL | 1 refills | Status: DC

## 2021-01-30 MED ORDER — FLUTICASONE PROPIONATE 50 MCG/ACT NA SUSP: 1.0000 | Freq: Two times a day (BID) | NASAL | 5 refills | Status: DC

## 2021-01-30 MED ORDER — PANTOPRAZOLE SODIUM 40 MG PO TBEC: 40.0000 mg | DELAYED_RELEASE_TABLET | Freq: Every day | ORAL | 1 refills | Status: DC

## 2021-01-30 MED ORDER — CETIRIZINE HCL 10 MG PO TABS: 10.0000 mg | ORAL_TABLET | Freq: Every day | ORAL | 1 refills | Status: DC

## 2021-01-30 NOTE — Assessment & Plan Note (Signed)
H/o preeclampsia On HCTZ 12.5 mg QD, switched to Losartan-HCTZ 50-12.5 mg QD for HTN now

## 2021-01-30 NOTE — Assessment & Plan Note (Signed)
BP Readings from Last 1 Encounters:  01/30/21 (!) 152/75   New-onset Started losartan-HCTZ 50-12.5 mg daily Counseled for compliance with the medications Advised DASH diet and moderate exercise/walking, at least 150 mins/week

## 2021-01-30 NOTE — Patient Instructions (Signed)
Please start taking Losartan-HCTZ 50-12.5 mg once daily.  Please continue to follow low salt diet and ambulate as tolerated.

## 2021-01-30 NOTE — Assessment & Plan Note (Signed)
Continue Zyrtec and Flonase.

## 2021-01-30 NOTE — Assessment & Plan Note (Signed)
She has lost about 5 lbs since last visit with diet modification and exercises Advised to follow DASH diet

## 2021-01-30 NOTE — Progress Notes (Signed)
Established Patient Office Visit  Subjective:  Patient ID: [name] [name], female    DOB: 22-Sep-1965  Age: 55 y.o. MRN: [id number]  CC:  Chief Complaint  Patient presents with   Follow-up    Follow up pt needs labs drawn for physical form at work cholesterol    HPI [name] [name] is a 55 year old female with PMH of GERD/gastritis, ADD with anxiety and obesity who presents for follow-up of her chronic medical conditions.  HTN: Her blood pressure was elevated on multiple measurements today.  She has been taking 2 tablets of HCTZ to help with the leg swelling recently.  She also reports mild headache, which is dull and generalized, intermittent.  She states that she has been anxious today as she had some argument with her cousin.  She follows up with Psychiatry for ADD and anxiety. Denies anhedonia, SI or HI.  She has been doing diet modification and exercises regularly.  She has lost about 5 lbs since the last visit.  She has brought some paperwork for insurance/work, which requires fasting blood tests. She had lunch about 3 hours prior to the visit. Past Medical History:  Diagnosis Date   Allergy    Phreesia 09/19/2020   Anxiety    Bradycardia 03/02/2015   Depression    GERD (gastroesophageal reflux disease)    History of hysterectomy    History of oophorectomy    Hx of cholecystectomy    Hypertension    IDA (iron deficiency anemia)    Near syncope 03/02/2015   Sciatica neuralgia, right    Stroke [place])    "light stroke 08/2015. No deficits   Tobacco abuse 03/03/2015   Vertigo     Past Surgical History:  Procedure Laterality Date   ABDOMINAL HYSTERECTOMY     BIOPSY  12/04/2016   Procedure: BIOPSY;  Surgeon: Danie Binder, MD;  [facility]: [facility];  Service: Endoscopy;;  gastric and duodenal biopsy   BIOPSY  07/30/2017   Procedure: BIOPSY;  Surgeon: Danie Binder, MD;  [facility];  Service: Endoscopy;;  random colon bx's   CESAREAN SECTION     x2    CESAREAN SECTION N/A    Phreesia 09/19/2020   CHOLECYSTECTOMY     COLONOSCOPY WITH ESOPHAGOGASTRODUODENOSCOPY (EGD)  2007   Dr. Olevia Perches: normal   COLONOSCOPY WITH PROPOFOL N/A 07/30/2017   Dr. Oneida Alar: Redundant colon, external/internal hemorrhoids. Colon otherwise normal.  Random colon biopsies negative.   ESOPHAGOGASTRODUODENOSCOPY (EGD) WITH PROPOFOL N/A 12/04/2016   Dr. Oneida Alar: Gastritis but no H.pylori, small bowel bx negative. gastric polyps benign fundic gland   HEMORRHOID SURGERY      Family History  Problem Relation Age of Onset   ALS Mother    Cardiomyopathy Father    Colon cancer Neg Hx     Social History   Socioeconomic History   Marital status: Married    Spouse name: Not on file   Number of children: Not on file   Years of education: Not on file   Highest education level: Not on file  Occupational History   Not on file  Tobacco Use   Smoking status: Every Day    Packs/day: 0.10    Years: 20.00    Pack years: 2.00    Types: Cigarettes   Smokeless tobacco: Never  Substance and Sexual Activity   Alcohol use: Yes    Alcohol/week: 0.0 standard drinks    Comment: seldom   Drug use: Yes  Frequency: 2.0 times per week    Types: Marijuana    Comment: marijuana maybe once a week   Sexual activity: Yes    Birth control/protection: Surgical  Other Topics Concern   Not on file  Social History Narrative   Drinks 5-6 cups of coffee daily.   Social Determinants of Health   Financial Resource Strain: Not on file  Food Insecurity: Not on file  Transportation Needs: Not on file  Physical Activity: Not on file  Stress: Not on file  Social Connections: Not on file  Intimate Partner Violence: Not on file    Outpatient Medications Prior to Visit  Medication Sig Dispense Refill   ADDERALL XR 30 MG 24 hr capsule Take 30 mg by mouth daily as needed. For focusing while at work     ALPRAZolam Duanne Moron) 0.5 MG tablet Take 0.5 mg by mouth 2 (two) times daily as needed for  anxiety.     ibuprofen (ADVIL) 600 MG tablet Take 1 tablet (600 mg total) by mouth every 6 (six) hours as needed. 30 tablet 0   lidocaine (LIDODERM) 5 % Place 1 patch onto the skin daily. Remove & Discard patch within 12 hours or as directed by MD 30 patch 0   cetirizine (ZYRTEC) 10 MG tablet Take 10 mg by mouth daily.     fluticasone (FLONASE) 50 MCG/ACT nasal spray Place 1 spray into both nostrils 2 (two) times daily.     hydrochlorothiazide (MICROZIDE) 12.5 MG capsule Take 12.5 mg by mouth daily. For fluid retention/swelling.  6   pantoprazole (PROTONIX) 40 MG tablet TAKE 1 TABLET (40 MG TOTAL) BY MOUTH 2 (TWO) TIMES DAILY BEFORE A MEAL. 60 tablet 5   No facility-administered medications prior to visit.    Allergies  Allergen Reactions   Sulfa Antibiotics Hives and Nausea And Vomiting    ROS Review of Systems  Constitutional:  Negative for chills and fever.  HENT:  Negative for congestion, sinus pressure, sinus pain and sore throat.   Eyes:  Negative for pain and discharge.  Respiratory:  Negative for cough and shortness of breath.   Cardiovascular:  Negative for chest pain and palpitations.  Gastrointestinal:  Negative for abdominal pain, constipation, diarrhea, nausea and vomiting.  Endocrine: Negative for polydipsia and polyuria.  Genitourinary:  Negative for dysuria and hematuria.  Musculoskeletal:  Positive for arthralgias. Negative for neck pain and neck stiffness.  Skin:  Negative for rash.  Neurological:  Negative for dizziness and weakness.  Psychiatric/Behavioral:  Negative for agitation and behavioral problems.      Objective:    Physical Exam Vitals reviewed.  Constitutional:      General: She is not in acute distress.    Appearance: She is not diaphoretic.  HENT:     Head: Normocephalic and atraumatic.     Nose: Nose normal.     Mouth/Throat:     Mouth: Mucous membranes are moist.  Eyes:     General: No scleral icterus.    Extraocular Movements:  Extraocular movements intact.  Cardiovascular:     Rate and Rhythm: Normal rate and regular rhythm.     Pulses: Normal pulses.     Heart sounds: Normal heart sounds. No murmur heard. Pulmonary:     Breath sounds: Normal breath sounds. No wheezing or rales.  Abdominal:     Palpations: Abdomen is soft.     Tenderness: There is no abdominal tenderness.  Musculoskeletal:     Cervical back: Neck supple. No tenderness.  Right lower leg: No edema.     Left lower leg: No edema.  Skin:    General: Skin is warm.     Findings: No rash.  Neurological:     General: No focal deficit present.     Mental Status: She is alert and oriented to person, place, and time.  Psychiatric:        Mood and Affect: Mood normal.        Behavior: Behavior normal.    BP (!) 152/75 (BP Location: Left Arm, Patient Position: Sitting, Cuff Size: Normal)   Pulse (!) 107   Temp 98.6 F (37 C) (Oral)   Resp 18   Ht 5' 2"  (1.575 m)   Wt 168 lb 1.9 oz (76.3 kg)   LMP 05/21/1997   SpO2 99%   BMI 30.75 kg/m  Wt Readings from Last 3 Encounters:  01/30/21 168 lb 1.9 oz (76.3 kg)  09/19/20 173 lb 12.8 oz (78.8 kg)  07/18/20 160 lb (72.6 kg)     Health Maintenance Due  Topic Date Due   COVID-19 Vaccine (1) Never done   Pneumococcal Vaccine 45-37 Years old (1 - PCV) Never done   Hepatitis C Screening  Never done   TETANUS/TDAP  Never done   MAMMOGRAM  Never done   Zoster Vaccines- Shingrix (1 of 2) Never done   INFLUENZA VACCINE  Never done    There are no preventive care reminders to display for this patient.  Lab Results  Component Value Date   TSH 1.233 09/07/2016   Lab Results  Component Value Date   WBC 11.5 (H) 07/18/2020   HGB 13.6 07/18/2020   HCT 41.9 07/18/2020   MCV 92.5 07/18/2020   PLT 297 07/18/2020   Lab Results  Component Value Date   NA 138 07/18/2020   K 3.3 (L) 07/18/2020   CO2 29 07/18/2020   GLUCOSE 96 07/18/2020   BUN 18 07/18/2020   CREATININE 1.02 (H) 07/18/2020    BILITOT 0.6 07/18/2020   ALKPHOS 87 07/18/2020   AST 17 07/18/2020   ALT 15 07/18/2020   PROT 8.0 07/18/2020   ALBUMIN 4.3 07/18/2020   CALCIUM 9.9 07/18/2020   ANIONGAP 14 07/18/2020   No results found for: CHOL No results found for: HDL No results found for: LDLCALC No results found for: TRIG No results found for: CHOLHDL No results found for: HGBA1C    Assessment & Plan:   Problem List Items Addressed This Visit       Cardiovascular and Mediastinum   Essential hypertension - Primary    BP Readings from Last 1 Encounters:  01/30/21 (!) 152/75  New-onset Started losartan-HCTZ 50-12.5 mg daily Counseled for compliance with the medications Advised DASH diet and moderate exercise/walking, at least 150 mins/week       Relevant Medications   losartan-hydrochlorothiazide (HYZAAR) 50-12.5 MG tablet     Respiratory   Allergic rhinitis    Continue Zyrtec and Flonase      Relevant Medications   cetirizine (ZYRTEC) 10 MG tablet   fluticasone (FLONASE) 50 MCG/ACT nasal spray     Digestive   GERD (gastroesophageal reflux disease)   Relevant Medications   pantoprazole (PROTONIX) 40 MG tablet     Other   Anxiety    On Xanax PRN Follows up with Psychiatry - Dr Toy Care      Leg edema    H/o preeclampsia On HCTZ 12.5 mg QD, switched to Losartan-HCTZ 50-12.5 mg QD for HTN now  Relevant Orders   CBC with Differential/Platelet   CMP14+EGFR   Obesity (BMI 30.0-34.9)    She has lost about 5 lbs since last visit with diet modification and exercises Advised to follow DASH diet      Other Visit Diagnoses     Mixed hyperlipidemia       Relevant Medications   losartan-hydrochlorothiazide (HYZAAR) 50-12.5 MG tablet   Other Relevant Orders   Lipid panel   Class 1 obesity without serious comorbidity in adult, unspecified BMI, unspecified obesity type       Relevant Orders   TSH   Screening for diabetes mellitus (DM)       Relevant Orders   HgB A1c   Need for  hepatitis C screening test       Relevant Orders   Hepatitis C Antibody   Vitamin D deficiency       Relevant Orders   Vitamin D (25 hydroxy)       Meds ordered this encounter  Medications   pantoprazole (PROTONIX) 40 MG tablet    Sig: Take 1 tablet (40 mg total) by mouth daily.    Dispense:  90 tablet    Refill:  1   cetirizine (ZYRTEC) 10 MG tablet    Sig: Take 1 tablet (10 mg total) by mouth daily.    Dispense:  90 tablet    Refill:  1   fluticasone (FLONASE) 50 MCG/ACT nasal spray    Sig: Place 1 spray into both nostrils 2 (two) times daily.    Dispense:  9.9 mL    Refill:  5   losartan-hydrochlorothiazide (HYZAAR) 50-12.5 MG tablet    Sig: Take 1 tablet by mouth daily.    Dispense:  30 tablet    Refill:  1    Follow-up: Return in about 6 weeks (around 03/13/2021) for HTN.    Lindell Spar, MD

## 2021-01-30 NOTE — Assessment & Plan Note (Signed)
On Xanax PRN Follows up with Psychiatry - Dr Kaur 

## 2021-02-01 DIAGNOSIS — Z1231 Encounter for screening mammogram for malignant neoplasm of breast: Secondary | ICD-10-CM | POA: Diagnosis not present

## 2021-02-01 DIAGNOSIS — Z01419 Encounter for gynecological examination (general) (routine) without abnormal findings: Secondary | ICD-10-CM | POA: Diagnosis not present

## 2021-02-01 DIAGNOSIS — Z6830 Body mass index (BMI) 30.0-30.9, adult: Secondary | ICD-10-CM | POA: Diagnosis not present

## 2021-02-06 ENCOUNTER — Telehealth: Payer: Self-pay | Admitting: Internal Medicine

## 2021-02-06 NOTE — Telephone Encounter (Signed)
Pt is calling to ask questions regarding her BP medcation.

## 2021-02-06 NOTE — Telephone Encounter (Signed)
[employer] form  Copied Noted Completed

## 2021-02-07 ENCOUNTER — Telehealth: Payer: Self-pay | Admitting: *Deleted

## 2021-02-07 DIAGNOSIS — Z1159 Encounter for screening for other viral diseases: Secondary | ICD-10-CM | POA: Diagnosis not present

## 2021-02-07 DIAGNOSIS — E782 Mixed hyperlipidemia: Secondary | ICD-10-CM | POA: Diagnosis not present

## 2021-02-07 DIAGNOSIS — E559 Vitamin D deficiency, unspecified: Secondary | ICD-10-CM | POA: Diagnosis not present

## 2021-02-07 DIAGNOSIS — E669 Obesity, unspecified: Secondary | ICD-10-CM | POA: Diagnosis not present

## 2021-02-07 DIAGNOSIS — R600 Localized edema: Secondary | ICD-10-CM | POA: Diagnosis not present

## 2021-02-07 DIAGNOSIS — Z131 Encounter for screening for diabetes mellitus: Secondary | ICD-10-CM | POA: Diagnosis not present

## 2021-02-07 NOTE — Telephone Encounter (Signed)
Pt started losartan hctz on 02-01-21 since then had diarrhea on the weekend and dry mouth really bad would like to know how long this last or what you recommend please advise

## 2021-02-07 NOTE — Telephone Encounter (Signed)
Pt called sent message to proivder regarding medication

## 2021-02-09 LAB — CBC WITH DIFFERENTIAL/PLATELET
Basophils Absolute: 0 10*3/uL (ref 0.0–0.2)
Basos: 1 %
EOS (ABSOLUTE): 0.1 10*3/uL (ref 0.0–0.4)
Eos: 1 %
Hematocrit: 38.1 % (ref 34.0–46.6)
Hemoglobin: 13 g/dL (ref 11.1–15.9)
Immature Grans (Abs): 0 10*3/uL (ref 0.0–0.1)
Immature Granulocytes: 0 %
Lymphocytes Absolute: 2.2 10*3/uL (ref 0.7–3.1)
Lymphs: 38 %
MCH: 30.3 pg (ref 26.6–33.0)
MCHC: 34.1 g/dL (ref 31.5–35.7)
MCV: 89 fL (ref 79–97)
Monocytes Absolute: 0.4 10*3/uL (ref 0.1–0.9)
Monocytes: 7 %
Neutrophils Absolute: 3 10*3/uL (ref 1.4–7.0)
Neutrophils: 53 %
Platelets: 234 10*3/uL (ref 150–450)
RBC: 4.29 x10E6/uL (ref 3.77–5.28)
RDW: 13.1 % (ref 11.7–15.4)
WBC: 5.8 10*3/uL (ref 3.4–10.8)

## 2021-02-09 LAB — CMP14+EGFR
ALT: 10 IU/L (ref 0–32)
AST: 16 IU/L (ref 0–40)
Albumin/Globulin Ratio: 1.7 (ref 1.2–2.2)
Albumin: 4.4 g/dL (ref 3.8–4.9)
Alkaline Phosphatase: 121 IU/L (ref 44–121)
BUN/Creatinine Ratio: 10 (ref 9–23)
BUN: 8 mg/dL (ref 6–24)
Bilirubin Total: 0.3 mg/dL (ref 0.0–1.2)
CO2: 26 mmol/L (ref 20–29)
Calcium: 10.3 mg/dL — ABNORMAL HIGH (ref 8.7–10.2)
Chloride: 98 mmol/L (ref 96–106)
Creatinine, Ser: 0.83 mg/dL (ref 0.57–1.00)
Globulin, Total: 2.6 g/dL (ref 1.5–4.5)
Glucose: 104 mg/dL — ABNORMAL HIGH (ref 65–99)
Potassium: 3.5 mmol/L (ref 3.5–5.2)
Sodium: 142 mmol/L (ref 134–144)
Total Protein: 7 g/dL (ref 6.0–8.5)
eGFR: 84 mL/min/{1.73_m2} (ref >59)

## 2021-02-09 LAB — LIPID PANEL
Chol/HDL Ratio: 4 ratio (ref 0.0–4.4)
Cholesterol, Total: 215 mg/dL — ABNORMAL HIGH (ref 100–199)
HDL: 54 mg/dL (ref >39)
LDL Chol Calc (NIH): 133 mg/dL — ABNORMAL HIGH (ref 0–99)
Triglycerides: 158 mg/dL — ABNORMAL HIGH (ref 0–149)
VLDL Cholesterol Cal: 28 mg/dL (ref 5–40)

## 2021-02-09 LAB — TSH: TSH: 2.62 u[IU]/mL (ref 0.450–4.500)

## 2021-02-09 LAB — HEPATITIS C ANTIBODY: Hep C Virus Ab: <0.1 s/co ratio (ref 0.0–0.9)

## 2021-02-09 LAB — HEMOGLOBIN A1C
Est. average glucose Bld gHb Est-mCnc: 126 mg/dL
Hgb A1c MFr Bld: 6 % — ABNORMAL HIGH (ref 4.8–5.6)

## 2021-02-09 LAB — VITAMIN D 25 HYDROXY (VIT D DEFICIENCY, FRACTURES): Vit D, 25-Hydroxy: 18.8 ng/mL — ABNORMAL LOW (ref 30.0–100.0)

## 2021-02-09 NOTE — Telephone Encounter (Signed)
Pt advised with verbal understanding  °

## 2021-02-16 NOTE — Telephone Encounter (Signed)
Patient pick up form / faxed form  

## 2021-02-24 ENCOUNTER — Other Ambulatory Visit: Payer: Self-pay | Admitting: Internal Medicine

## 2021-02-24 DIAGNOSIS — I10 Essential (primary) hypertension: Secondary | ICD-10-CM

## 2021-02-26 ENCOUNTER — Other Ambulatory Visit: Payer: Self-pay | Admitting: Internal Medicine

## 2021-02-26 DIAGNOSIS — I10 Essential (primary) hypertension: Secondary | ICD-10-CM

## 2021-03-15 ENCOUNTER — Encounter: Payer: Self-pay | Admitting: Internal Medicine

## 2021-03-15 ENCOUNTER — Ambulatory Visit: Payer: BC Managed Care – PPO | Admitting: Internal Medicine

## 2021-03-15 ENCOUNTER — Other Ambulatory Visit: Payer: Self-pay

## 2021-03-15 VITALS — BP 152/84 | HR 90 | Resp 18 | Ht 62.0 in | Wt 171.0 lb

## 2021-03-15 DIAGNOSIS — E782 Mixed hyperlipidemia: Secondary | ICD-10-CM | POA: Diagnosis not present

## 2021-03-15 DIAGNOSIS — I10 Essential (primary) hypertension: Secondary | ICD-10-CM

## 2021-03-15 DIAGNOSIS — E559 Vitamin D deficiency, unspecified: Secondary | ICD-10-CM

## 2021-03-15 DIAGNOSIS — R7303 Prediabetes: Secondary | ICD-10-CM

## 2021-03-15 MED ORDER — HYDROCHLOROTHIAZIDE 25 MG PO TABS: 25.0000 mg | ORAL_TABLET | Freq: Every day | ORAL | 0 refills | Status: DC

## 2021-03-15 MED ORDER — AMLODIPINE BESYLATE 5 MG PO TABS
5.0000 mg | ORAL_TABLET | Freq: Every day | ORAL | 0 refills | Status: DC
Start: 2021-03-15 — End: 2021-06-12

## 2021-03-15 MED ORDER — VITAMIN D (ERGOCALCIFEROL) 1.25 MG (50000 UNIT) PO CAPS: 50000.0000 [IU] | ORAL_CAPSULE | ORAL | 5 refills | Status: DC

## 2021-03-15 NOTE — Assessment & Plan Note (Signed)
Lipid profile reviewed Needs to follow low cholesterol diet- DASH diet material provided 

## 2021-03-15 NOTE — Assessment & Plan Note (Addendum)
Lab Results  Component Value Date   HGBA1C 6.0 (H) 02/07/2021   advised to follow DASH diet for now

## 2021-03-15 NOTE — Progress Notes (Signed)
Established Patient Office Visit  Subjective:  Patient ID: [name] [name], female    DOB: June 08, 1965  Age: 55 y.o. MRN: [id number]  CC:  Chief Complaint  Patient presents with   Follow-up    6 week follow up thinks bp med is making her bp go up also would like a note stating she doesn't need to sit all day also would like to discuss herbs    HPI [name] [name]  is a 55 year old female with PMH of GERD/gastritis, ADD with anxiety and obesity who presents for follow-up of her chronic medical conditions.   HTN: Her blood pressure was elevated on multiple measurements today. She also reports mild headache, which is dull and generalized, intermittent and has been worse since starting Losartan.  She also complains of hand swelling since starting losartan and wants to take a different medicine for HTN.  She wants to continue HCTZ as she has chronic leg swelling.  She denies any chest pain, dyspnea or palpitations currently.  She follows up with Psychiatry for ADD and anxiety. Denies anhedonia, SI or HI.  Blood tests were reviewed and discussed with the patient in detail.  It showed HLD, prediabetes and vitamin D deficiency.  She asks about a herbal supplement for weight loss, which affects " brown fat".  I advised her that there are multiple herbal supplements marketed for weight loss, but they have not shown proven clinical benefit and can cause side effects.   Past Medical History:  Diagnosis Date   Allergy    Phreesia 09/19/2020   Anxiety    Bradycardia 03/02/2015   Depression    GERD (gastroesophageal reflux disease)    History of hysterectomy    History of oophorectomy    Hx of cholecystectomy    Hypertension    IDA (iron deficiency anemia)    Near syncope 03/02/2015   Sciatica neuralgia, right    Stroke ([place])    "light stroke 08/2015. No deficits   Tobacco abuse 03/03/2015   Vertigo     Past Surgical History:  Procedure Laterality Date   ABDOMINAL HYSTERECTOMY     BIOPSY   12/04/2016   Procedure: BIOPSY;  Surgeon: Danie Binder, MD;  Location: [facility];  Service: Endoscopy;;  gastric and duodenal biopsy   BIOPSY  07/30/2017   Procedure: BIOPSY;  Surgeon: Danie Binder, MD;  Location: [facility];  Service: Endoscopy;;  random colon bx's   CESAREAN SECTION     x2   CESAREAN SECTION N/A    Phreesia 09/19/2020   CHOLECYSTECTOMY     COLONOSCOPY WITH ESOPHAGOGASTRODUODENOSCOPY (EGD)  2007   Dr. Olevia Perches: normal   COLONOSCOPY WITH PROPOFOL N/A 07/30/2017   Dr. Oneida Alar: Redundant colon, external/internal hemorrhoids. Colon otherwise normal.  Random colon biopsies negative.   ESOPHAGOGASTRODUODENOSCOPY (EGD) WITH PROPOFOL N/A 12/04/2016   Dr. Oneida Alar: Gastritis but no H.pylori, small bowel bx negative. gastric polyps benign fundic gland   HEMORRHOID SURGERY      Family History  Problem Relation Age of Onset   ALS Mother    Cardiomyopathy Father    Colon cancer Neg Hx     Social History   Socioeconomic History   Marital status: Married    Spouse name: Not on file   Number of children: Not on file   Years of education: Not on file   Highest education level: Not on file  Occupational History   Not on file  Tobacco Use   Smoking status: Former  Packs/day: 0.10    Years: 20.00    Pack years: 2.00    Types: Cigarettes    Quit date: 02/23/2021    Years since quitting: 0.0   Smokeless tobacco: Never  Substance and Sexual Activity   Alcohol use: Yes    Alcohol/week: 0.0 standard drinks    Comment: seldom   Drug use: Yes    Frequency: 2.0 times per week    Types: Marijuana    Comment: marijuana maybe once a week   Sexual activity: Yes    Birth control/protection: Surgical  Other Topics Concern   Not on file  Social History Narrative   Drinks 5-6 cups of coffee daily.   Social Determinants of Health   Financial Resource Strain: Not on file  Food Insecurity: Not on file  Transportation Needs: Not on file  Physical Activity: Not on file   Stress: Not on file  Social Connections: Not on file  Intimate Partner Violence: Not on file    Outpatient Medications Prior to Visit  Medication Sig Dispense Refill   ADDERALL XR 30 MG 24 hr capsule Take 30 mg by mouth daily as needed. For focusing while at work     ALPRAZolam Duanne Moron) 0.5 MG tablet Take 0.5 mg by mouth 2 (two) times daily as needed for anxiety.     cetirizine (ZYRTEC) 10 MG tablet Take 1 tablet (10 mg total) by mouth daily. 90 tablet 1   estradiol (CLIMARA - DOSED IN MG/24 HR) 0.05 mg/24hr patch estradiol 0.05 mg/24 hr weekly transdermal patch  APPLY 1 PATCH BY TRANSDERMAL ROUTE EVERY WEEK     fluticasone (FLONASE) 50 MCG/ACT nasal spray Place 1 spray into both nostrils 2 (two) times daily. 9.9 mL 5   ibuprofen (ADVIL) 600 MG tablet Take 1 tablet (600 mg total) by mouth every 6 (six) hours as needed. 30 tablet 0   lidocaine (LIDODERM) 5 % Place 1 patch onto the skin daily. Remove & Discard patch within 12 hours or as directed by MD 30 patch 0   pantoprazole (PROTONIX) 40 MG tablet Take 1 tablet (40 mg total) by mouth daily. 90 tablet 1   losartan-hydrochlorothiazide (HYZAAR) 50-12.5 MG tablet TAKE 1 TABLET BY MOUTH EVERY DAY 30 tablet 1   No facility-administered medications prior to visit.    Allergies  Allergen Reactions   Sulfa Antibiotics Hives and Nausea And Vomiting    ROS Review of Systems  Constitutional:  Negative for chills and fever.  HENT:  Negative for congestion, sinus pressure, sinus pain and sore throat.   Eyes:  Negative for pain and discharge.  Respiratory:  Negative for cough and shortness of breath.   Cardiovascular:  Positive for leg swelling. Negative for chest pain and palpitations.  Gastrointestinal:  Negative for abdominal pain, constipation, diarrhea, nausea and vomiting.  Endocrine: Negative for polydipsia and polyuria.  Genitourinary:  Negative for dysuria and hematuria.  Musculoskeletal:  Positive for arthralgias. Negative for neck  pain and neck stiffness.  Skin:  Negative for rash.  Neurological:  Negative for dizziness and weakness.  Psychiatric/Behavioral:  Negative for agitation and behavioral problems. The patient is nervous/anxious.      Objective:    Physical Exam Vitals reviewed.  Constitutional:      General: She is not in acute distress.    Appearance: She is not diaphoretic.  HENT:     Head: Normocephalic and atraumatic.     Nose: Nose normal.     Mouth/Throat:     Mouth:  Mucous membranes are moist.  Eyes:     General: No scleral icterus.    Extraocular Movements: Extraocular movements intact.  Cardiovascular:     Rate and Rhythm: Normal rate and regular rhythm.     Pulses: Normal pulses.     Heart sounds: Normal heart sounds. No murmur heard. Pulmonary:     Breath sounds: Normal breath sounds. No wheezing or rales.  Abdominal:     Palpations: Abdomen is soft.     Tenderness: There is no abdominal tenderness.  Musculoskeletal:     Cervical back: Neck supple. No tenderness.     Right lower leg: No edema.     Left lower leg: No edema.  Skin:    General: Skin is warm.     Findings: No rash.  Neurological:     General: No focal deficit present.     Mental Status: She is alert and oriented to person, place, and time.  Psychiatric:        Mood and Affect: Mood normal.        Behavior: Behavior normal.    BP (!) 152/84 (BP Location: Right Arm, Cuff Size: Normal)   Pulse 90   Resp 18   Ht _0  (1.575 m)   Wt 171 lb (77.6 kg)   LMP 05/21/1997   SpO2 98%   BMI 31.28 kg/m  Wt Readings from Last 3 Encounters:  03/15/21 171 lb (77.6 kg)  01/30/21 168 lb 1.9 oz (76.3 kg)  09/19/20 173 lb 12.8 oz (78.8 kg)     Health Maintenance Due  Topic Date Due   COVID-19 Vaccine (1) Never done   TETANUS/TDAP  Never done   MAMMOGRAM  Never done   Zoster Vaccines- Shingrix (1 of 2) Never done   INFLUENZA VACCINE  Never done    There are no preventive care reminders to display for this  patient.  Lab Results  Component Value Date   TSH 2.620 02/07/2021   Lab Results  Component Value Date   WBC 5.8 02/07/2021   HGB 13.0 02/07/2021   HCT 38.1 02/07/2021   MCV 89 02/07/2021   PLT 234 02/07/2021   Lab Results  Component Value Date   NA 142 02/07/2021   K 3.5 02/07/2021   CO2 26 02/07/2021   GLUCOSE 104 (H) 02/07/2021   BUN 8 02/07/2021   CREATININE 0.83 02/07/2021   BILITOT 0.3 02/07/2021   ALKPHOS 121 02/07/2021   AST 16 02/07/2021   ALT 10 02/07/2021   PROT 7.0 02/07/2021   ALBUMIN 4.4 02/07/2021   CALCIUM 10.3 (H) 02/07/2021   ANIONGAP 14 07/18/2020   EGFR 84 02/07/2021   Lab Results  Component Value Date   CHOL 215 (H) 02/07/2021   Lab Results  Component Value Date   HDL 54 02/07/2021   Lab Results  Component Value Date   LDLCALC 133 (H) 02/07/2021   Lab Results  Component Value Date   TRIG 158 (H) 02/07/2021   Lab Results  Component Value Date   CHOLHDL 4.0 02/07/2021   Lab Results  Component Value Date   HGBA1C 6.0 (H) 02/07/2021      Assessment & Plan:   Problem List Items Addressed This Visit       Cardiovascular and Mediastinum   Essential hypertension - Primary    BP Readings from Last 1 Encounters:  03/15/21 (!) 152/84  Uncontrolled with losartan-HCTZ 50-12.5 mg daily, does not like effects of Losartan, thinks she has hand swelling from  it - reassured about it is less likely to be the reason for hand swelling, she prefers to take different medication Increased dose of HCTZ to 25 mg daily Started amlodipine 5 mg daily Counseled for compliance with the medications Advised DASH diet and moderate exercise/walking, at least 150 mins/week       Relevant Medications   amLODipine (NORVASC) 5 MG tablet   hydrochlorothiazide (HYDRODIURIL) 25 MG tablet     Other   Vitamin D deficiency     Lab Results  Component Value Date   VD25OH 18.8 (L) 02/07/2021  Started Vitamin D 50,000 IU qw       Relevant Medications    Vitamin D, Ergocalciferol, (DRISDOL) 1.25 MG (50000 UNIT) CAPS capsule   Mixed hyperlipidemia    Lipid profile reviewed Needs to follow low cholesterol diet- DASH diet material provided      Relevant Medications   amLODipine (NORVASC) 5 MG tablet   hydrochlorothiazide (HYDRODIURIL) 25 MG tablet   Prediabetes    Lab Results  Component Value Date   HGBA1C 6.0 (H) 02/07/2021  advised to follow DASH diet for now       Meds ordered this encounter  Medications   amLODipine (NORVASC) 5 MG tablet    Sig: Take 1 tablet (5 mg total) by mouth daily.    Dispense:  90 tablet    Refill:  0   hydrochlorothiazide (HYDRODIURIL) 25 MG tablet    Sig: Take 1 tablet (25 mg total) by mouth daily.    Dispense:  90 tablet    Refill:  0   Vitamin D, Ergocalciferol, (DRISDOL) 1.25 MG (50000 UNIT) CAPS capsule    Sig: Take 1 capsule (50,000 Units total) by mouth every 7 (seven) days.    Dispense:  5 capsule    Refill:  5    Follow-up: Return in about 3 months (around 06/15/2021) for HTN.    Lindell Spar, MD

## 2021-03-15 NOTE — Assessment & Plan Note (Signed)
BP Readings from Last 1 Encounters:  03/15/21 (!) 152/84   Uncontrolled with losartan-HCTZ 50-12.5 mg daily, does not like effects of Losartan, thinks she has hand swelling from it - reassured about it is less likely to be the reason for hand swelling, she prefers to take different medication Increased dose of HCTZ to 25 mg daily Started amlodipine 5 mg daily Counseled for compliance with the medications Advised DASH diet and moderate exercise/walking, at least 150 mins/week

## 2021-03-15 NOTE — Assessment & Plan Note (Signed)
  Lab Results  Component Value Date   VD25OH 18.8 (L) 02/07/2021   Started Vitamin D 50,000 IU qw

## 2021-03-15 NOTE — Patient Instructions (Signed)
Please start taking Amlodipine and HCTZ instead of Losartan-HCTZ.  Please follow DASH diet and perform moderate exercise/walking at least 150 mins/week.

## 2021-03-20 DIAGNOSIS — H33191 Other retinoschisis and retinal cysts, right eye: Secondary | ICD-10-CM | POA: Diagnosis not present

## 2021-03-20 DIAGNOSIS — H01001 Unspecified blepharitis right upper eyelid: Secondary | ICD-10-CM | POA: Diagnosis not present

## 2021-03-20 DIAGNOSIS — H01004 Unspecified blepharitis left upper eyelid: Secondary | ICD-10-CM | POA: Diagnosis not present

## 2021-03-20 DIAGNOSIS — H2513 Age-related nuclear cataract, bilateral: Secondary | ICD-10-CM | POA: Diagnosis not present

## 2021-03-20 DIAGNOSIS — H01002 Unspecified blepharitis right lower eyelid: Secondary | ICD-10-CM | POA: Diagnosis not present

## 2021-03-27 DIAGNOSIS — H43813 Vitreous degeneration, bilateral: Secondary | ICD-10-CM | POA: Diagnosis not present

## 2021-03-27 DIAGNOSIS — H2513 Age-related nuclear cataract, bilateral: Secondary | ICD-10-CM | POA: Diagnosis not present

## 2021-03-27 DIAGNOSIS — H35033 Hypertensive retinopathy, bilateral: Secondary | ICD-10-CM | POA: Diagnosis not present

## 2021-03-27 DIAGNOSIS — H33191 Other retinoschisis and retinal cysts, right eye: Secondary | ICD-10-CM | POA: Diagnosis not present

## 2021-03-31 ENCOUNTER — Other Ambulatory Visit: Payer: Self-pay | Admitting: Internal Medicine

## 2021-03-31 DIAGNOSIS — I10 Essential (primary) hypertension: Secondary | ICD-10-CM

## 2021-04-17 DIAGNOSIS — F900 Attention-deficit hyperactivity disorder, predominantly inattentive type: Secondary | ICD-10-CM | POA: Diagnosis not present

## 2021-04-17 DIAGNOSIS — F4310 Post-traumatic stress disorder, unspecified: Secondary | ICD-10-CM | POA: Diagnosis not present

## 2021-04-18 ENCOUNTER — Encounter (HOSPITAL_COMMUNITY): Payer: Self-pay | Admitting: Emergency Medicine

## 2021-04-18 ENCOUNTER — Emergency Department (HOSPITAL_COMMUNITY): Payer: BC Managed Care – PPO

## 2021-04-18 ENCOUNTER — Emergency Department (HOSPITAL_COMMUNITY)
Admission: EM | Admit: 2021-04-18 | Discharge: 2021-04-18 | Disposition: A | Discharge status: Home / Self Care | Payer: BC Managed Care – PPO | Attending: Emergency Medicine | Admitting: Emergency Medicine

## 2021-04-18 DIAGNOSIS — R110 Nausea: Secondary | ICD-10-CM | POA: Diagnosis not present

## 2021-04-18 DIAGNOSIS — I10 Essential (primary) hypertension: Secondary | ICD-10-CM | POA: Insufficient documentation

## 2021-04-18 DIAGNOSIS — Z20822 Contact with and (suspected) exposure to covid-19: Secondary | ICD-10-CM | POA: Diagnosis not present

## 2021-04-18 DIAGNOSIS — Z79899 Other long term (current) drug therapy: Secondary | ICD-10-CM | POA: Insufficient documentation

## 2021-04-18 DIAGNOSIS — R519 Headache, unspecified: Secondary | ICD-10-CM | POA: Diagnosis not present

## 2021-04-18 DIAGNOSIS — Z87891 Personal history of nicotine dependence: Secondary | ICD-10-CM | POA: Insufficient documentation

## 2021-04-18 DIAGNOSIS — G4489 Other headache syndrome: Secondary | ICD-10-CM | POA: Diagnosis not present

## 2021-04-18 DIAGNOSIS — Y900 Blood alcohol level of less than 20 mg/100 ml: Secondary | ICD-10-CM | POA: Diagnosis not present

## 2021-04-18 DIAGNOSIS — H5462 Unqualified visual loss, left eye, normal vision right eye: Secondary | ICD-10-CM | POA: Diagnosis not present

## 2021-04-18 DIAGNOSIS — I639 Cerebral infarction, unspecified: Secondary | ICD-10-CM | POA: Insufficient documentation

## 2021-04-18 DIAGNOSIS — E876 Hypokalemia: Secondary | ICD-10-CM

## 2021-04-18 DIAGNOSIS — H5710 Ocular pain, unspecified eye: Secondary | ICD-10-CM | POA: Diagnosis not present

## 2021-04-18 LAB — I-STAT CHEM 8, ED
BUN: 12 mg/dL (ref 6–20)
Calcium, Ion: 1.22 mmol/L (ref 1.15–1.40)
Chloride: 99 mmol/L (ref 98–111)
Creatinine, Ser: 0.9 mg/dL (ref 0.44–1.00)
Glucose, Bld: 94 mg/dL (ref 70–99)
HCT: 39 % (ref 36.0–46.0)
Hemoglobin: 13.3 g/dL (ref 12.0–15.0)
Potassium: 3 mmol/L — ABNORMAL LOW (ref 3.5–5.1)
Sodium: 141 mmol/L (ref 135–145)
TCO2: 30 mmol/L (ref 22–32)

## 2021-04-18 LAB — COMPREHENSIVE METABOLIC PANEL
ALT: 11 U/L (ref 0–44)
AST: 15 U/L (ref 15–41)
Albumin: 3.1 g/dL — ABNORMAL LOW (ref 3.5–5.0)
Alkaline Phosphatase: 72 U/L (ref 38–126)
Anion gap: 6 (ref 5–15)
BUN: 12 mg/dL (ref 6–20)
CO2: 25 mmol/L (ref 22–32)
Calcium: 7.7 mg/dL — ABNORMAL LOW (ref 8.9–10.3)
Chloride: 109 mmol/L (ref 98–111)
Creatinine, Ser: 0.72 mg/dL (ref 0.44–1.00)
GFR, Estimated: >60 mL/min (ref >60)
Glucose, Bld: 85 mg/dL (ref 70–99)
Potassium: 2.4 mmol/L — CL (ref 3.5–5.1)
Sodium: 140 mmol/L (ref 135–145)
Total Bilirubin: 0.4 mg/dL (ref 0.3–1.2)
Total Protein: 5.6 g/dL — ABNORMAL LOW (ref 6.5–8.1)

## 2021-04-18 LAB — DIFFERENTIAL
Abs Immature Granulocytes: 0.01 10*3/uL (ref 0.00–0.07)
Basophils Absolute: 0 10*3/uL (ref 0.0–0.1)
Basophils Relative: 1 %
Eosinophils Absolute: 0.2 10*3/uL (ref 0.0–0.5)
Eosinophils Relative: 3 %
Immature Granulocytes: 0 %
Lymphocytes Relative: 31 %
Lymphs Abs: 1.7 10*3/uL (ref 0.7–4.0)
Monocytes Absolute: 0.5 10*3/uL (ref 0.1–1.0)
Monocytes Relative: 8 %
Neutro Abs: 3.1 10*3/uL (ref 1.7–7.7)
Neutrophils Relative %: 57 %

## 2021-04-18 LAB — CBC
HCT: 38.5 % (ref 36.0–46.0)
Hemoglobin: 13 g/dL (ref 12.0–15.0)
MCH: 31.3 pg (ref 26.0–34.0)
MCHC: 33.8 g/dL (ref 30.0–36.0)
MCV: 92.5 fL (ref 80.0–100.0)
Platelets: 235 10*3/uL (ref 150–400)
RBC: 4.16 MIL/uL (ref 3.87–5.11)
RDW: 12.8 % (ref 11.5–15.5)
WBC: 5.5 10*3/uL (ref 4.0–10.5)
nRBC: 0 % (ref 0.0–0.2)

## 2021-04-18 LAB — MAGNESIUM: Magnesium: 1.2 mg/dL — ABNORMAL LOW (ref 1.7–2.4)

## 2021-04-18 LAB — PROTIME-INR
INR: 1 (ref 0.8–1.2)
Prothrombin Time: 13.2 seconds (ref 11.4–15.2)

## 2021-04-18 LAB — SEDIMENTATION RATE: Sed Rate: 35 mm/hr — ABNORMAL HIGH (ref 0–22)

## 2021-04-18 LAB — ETHANOL: Alcohol, Ethyl (B): <10 mg/dL (ref <10)

## 2021-04-18 LAB — RESP PANEL BY RT-PCR (FLU A&B, COVID) ARPGX2
Influenza A by PCR: NEGATIVE
Influenza B by PCR: NEGATIVE
SARS Coronavirus 2 by RT PCR: NEGATIVE

## 2021-04-18 LAB — APTT: aPTT: 32 seconds (ref 24–36)

## 2021-04-18 MED ORDER — PROCHLORPERAZINE EDISYLATE 10 MG/2ML IJ SOLN
10.0000 mg | Freq: Once | INTRAMUSCULAR | Status: AC
  Administered 2021-04-18: 10 mg via INTRAVENOUS

## 2021-04-18 MED ORDER — FLUORESCEIN SODIUM 1 MG OP STRP
1.0000 | ORAL_STRIP | Freq: Once | OPHTHALMIC | Status: AC
  Administered 2021-04-18: 1 via OPHTHALMIC

## 2021-04-18 MED ORDER — TETRACAINE HCL 0.5 % OP SOLN
2.0000 [drp] | Freq: Once | OPHTHALMIC | Status: AC
  Administered 2021-04-18: 2 [drp] via OPHTHALMIC

## 2021-04-18 MED ORDER — BUTALBITAL-APAP-CAFFEINE 50-325-40 MG PO TABS: 1.0000 | ORAL_TABLET | Freq: Four times a day (QID) | ORAL | 0 refills | Status: AC | PRN

## 2021-04-18 MED ORDER — ACETAMINOPHEN 500 MG PO TABS
1000.0000 mg | ORAL_TABLET | Freq: Once | ORAL | Status: AC
  Administered 2021-04-18: 1000 mg via ORAL

## 2021-04-18 MED ORDER — POTASSIUM CHLORIDE 10 MEQ/100ML IV SOLN
10.0000 meq | INTRAVENOUS | Status: AC
  Administered 2021-04-18 (×4): 10 meq via INTRAVENOUS

## 2021-04-18 MED ORDER — DIPHENHYDRAMINE HCL 50 MG/ML IJ SOLN
25.0000 mg | Freq: Once | INTRAMUSCULAR | Status: AC
  Administered 2021-04-18: 25 mg via INTRAVENOUS

## 2021-04-18 MED ORDER — POTASSIUM CHLORIDE CRYS ER 20 MEQ PO TBCR
40.0000 meq | EXTENDED_RELEASE_TABLET | Freq: Once | ORAL | Status: AC
  Administered 2021-04-18: 40 meq via ORAL

## 2021-04-18 MED ORDER — MAGNESIUM SULFATE 2 GM/50ML IV SOLN
2.0000 g | Freq: Once | INTRAVENOUS | Status: AC
  Administered 2021-04-18: 2 g via INTRAVENOUS

## 2021-04-18 NOTE — ED Provider Notes (Signed)
Received patient from transfer from [facility], ER.  Please see previous providers note for complete H&P.  This is a 55 year old female who presents complaining of acute onset of headache this a.m. with vision loss in the left eye.  Reports similar symptoms in 2018 with negative work-up.  Code stroke was not initiated during this visit.  Unsure last known normal however patient reported feeling fine last night.  Initial evaluation revealed an elevated sed rate, as well as hypokalemia and hypomagnesia.  Patient currently receiving potassium and magnesium supplementation.  Unfortunately CT scanner at any pain was down and patient sent here for head CT scan and further work-up.  1:10 PM Patient's magnesium and potassium have been repleted.  Migraine cocktail did provide improvement of her symptoms.  Due to an elevated sed rate of 35, I did reach out and spoke with on call neurologist, Dr. Otelia Limes who felt patient would likely benefit from outpatient reevaluation by neurology, repeat sed rates outpatient.  He does not recommend temporal artery biopsy or medication at this time.  I will give patient strict return precautions such as worsening vision changes, pain with chewing, having tenderness to her temporal region, or if she has any other concern.  BP 112/77   Pulse 74   Temp 97.9 F (36.6 C) (Oral)   Resp 16   Ht 5\' 2"  (1.575 m)   Wt 74.8 kg   LMP 05/21/1997   SpO2 97%   BMI 30.18 kg/m   Results for orders placed or performed during the hospital encounter of 04/18/21  Resp Panel by RT-PCR (Flu A&B, Covid) Nasopharyngeal Swab   Specimen: Nasopharyngeal Swab; Nasopharyngeal(NP) swabs in vial transport medium  Result Value Ref Range   SARS Coronavirus 2 by RT PCR NEGATIVE NEGATIVE   Influenza A by PCR NEGATIVE NEGATIVE   Influenza B by PCR NEGATIVE NEGATIVE  Ethanol  Result Value Ref Range   Alcohol, Ethyl (B) <10 <10 mg/dL  Protime-INR  Result Value Ref Range   Prothrombin Time 13.2 11.4  - 15.2 seconds   INR 1.0 0.8 - 1.2  APTT  Result Value Ref Range   aPTT 32 24 - 36 seconds  CBC  Result Value Ref Range   WBC 5.5 4.0 - 10.5 K/uL   RBC 4.16 3.87 - 5.11 MIL/uL   Hemoglobin 13.0 12.0 - 15.0 g/dL   HCT 04/20/21 10.6 - 26.9 %   MCV 92.5 80.0 - 100.0 fL   MCH 31.3 26.0 - 34.0 pg   MCHC 33.8 30.0 - 36.0 g/dL   RDW 48.5 46.2 - 70.3 %   Platelets 235 150 - 400 K/uL   nRBC 0.0 0.0 - 0.2 %  Differential  Result Value Ref Range   Neutrophils Relative % 57 %   Neutro Abs 3.1 1.7 - 7.7 K/uL   Lymphocytes Relative 31 %   Lymphs Abs 1.7 0.7 - 4.0 K/uL   Monocytes Relative 8 %   Monocytes Absolute 0.5 0.1 - 1.0 K/uL   Eosinophils Relative 3 %   Eosinophils Absolute 0.2 0.0 - 0.5 K/uL   Basophils Relative 1 %   Basophils Absolute 0.0 0.0 - 0.1 K/uL   Immature Granulocytes 0 %   Abs Immature Granulocytes 0.01 0.00 - 0.07 K/uL  Comprehensive metabolic panel  Result Value Ref Range   Sodium 140 135 - 145 mmol/L   Potassium 2.4 (LL) 3.5 - 5.1 mmol/L   Chloride 109 98 - 111 mmol/L   CO2 25 22 - 32  mmol/L   Glucose, Bld 85 70 - 99 mg/dL   BUN 12 6 - 20 mg/dL   Creatinine, Ser 1.51 0.44 - 1.00 mg/dL   Calcium 7.7 (L) 8.9 - 10.3 mg/dL   Total Protein 5.6 (L) 6.5 - 8.1 g/dL   Albumin 3.1 (L) 3.5 - 5.0 g/dL   AST 15 15 - 41 U/L   ALT 11 0 - 44 U/L   Alkaline Phosphatase 72 38 - 126 U/L   Total Bilirubin 0.4 0.3 - 1.2 mg/dL   GFR, Estimated >76 >16 mL/min   Anion gap 6 5 - 15  Sedimentation rate  Result Value Ref Range   Sed Rate 35 (H) 0 - 22 mm/hr  Magnesium  Result Value Ref Range   Magnesium 1.2 (L) 1.7 - 2.4 mg/dL  I-stat chem 8, ED  Result Value Ref Range   Sodium 141 135 - 145 mmol/L   Potassium 3.0 (L) 3.5 - 5.1 mmol/L   Chloride 99 98 - 111 mmol/L   BUN 12 6 - 20 mg/dL   Creatinine, Ser 0.73 0.44 - 1.00 mg/dL   Glucose, Bld 94 70 - 99 mg/dL   Calcium, Ion 7.10 6.26 - 1.40 mmol/L   TCO2 30 22 - 32 mmol/L   Hemoglobin 13.3 12.0 - 15.0 g/dL   HCT 94.8 54.6 -  27.0 %   CT HEAD WO CONTRAST  Result Date: 04/18/2021 CLINICAL DATA:  Headache.  Rule out intracranial hemorrhage. EXAM: CT HEAD WITHOUT CONTRAST TECHNIQUE: Contiguous axial images were obtained from the base of the skull through the vertex without intravenous contrast. COMPARISON:  09/07/2016 FINDINGS: Brain: No evidence of acute infarction, hemorrhage, hydrocephalus, extra-axial collection or mass lesion/mass effect. Vascular: No hyperdense vessel or unexpected calcification. Skull: Normal. Negative for fracture or focal lesion. Sinuses/Orbits: No acute finding. Other: None. IMPRESSION: No acute intracranial abnormalities. Normal brain. Electronically Signed   By: Signa Kell M.D.   On: 04/18/2021 11:26      Fayrene Helper, PA-C 04/18/21 [id number]    Cathren Laine, MD 04/24/21 1016

## 2021-04-18 NOTE — ED Notes (Signed)
0800:10 mEq per Potassium Chloride given to [employer] nurse Carollee Herter.

## 2021-04-18 NOTE — ED Notes (Signed)
Visual Acuity 10/80 BIL

## 2021-04-18 NOTE — ED Notes (Signed)
Patient verbalizes understanding of discharge instructions. Opportunity for questioning and answers were provided. Armband removed by staff, pt discharged from ED via wheelchair to lobby to return home with SO.  

## 2021-04-18 NOTE — ED Notes (Signed)
RN called CT for update on scan status. CT to send for transport now.

## 2021-04-18 NOTE — ED Notes (Signed)
Patient transported to CT 

## 2021-04-18 NOTE — ED Provider Notes (Signed)
[facility] EMERGENCY DEPARTMENT Provider Note   CSN: [id number] Arrival date & time: 04/18/21  0537     History Chief Complaint  Patient presents with   Headache    [name] [name] is a 55 y.o. female.  Patient presents to the emergency department for evaluation of headache and loss of vision in the left eye.  Patient reports that she awakened at 430 with symptoms.  She went to bed feeling fine.  Patient has not had any trouble with speech.  She has not had any numbness, tingling or weakness of the extremities.  Patient reports that the same thing happened in 2018, she was transferred to [facility] for work-up at that time.      Past Medical History:  Diagnosis Date   Allergy    Phreesia 09/19/2020   Anxiety    Bradycardia 03/02/2015   Depression    GERD (gastroesophageal reflux disease)    History of hysterectomy    History of oophorectomy    Hx of cholecystectomy    Hypertension    IDA (iron deficiency anemia)    Near syncope 03/02/2015   Sciatica neuralgia, right    Stroke ([place])    "light stroke 08/2015. No deficits   Tobacco abuse 03/03/2015   Vertigo     Patient Active Problem List   Diagnosis Date Noted   Vitamin D deficiency 03/15/2021   Mixed hyperlipidemia 03/15/2021   Prediabetes 03/15/2021   Essential hypertension 01/30/2021   Allergic rhinitis 01/30/2021   ADD (attention deficit disorder) 09/19/2020   Anxiety 09/19/2020   Leg edema 09/19/2020   Pulmonary nodule 09/19/2020   Obesity (BMI 30.0-34.9) 09/19/2020   GERD (gastroesophageal reflux disease) 09/07/2016   Vision loss, left eye 09/07/2016   Snoring 04/19/2015   Tobacco abuse 03/03/2015   Chronic diarrhea 08/01/2007    Past Surgical History:  Procedure Laterality Date   ABDOMINAL HYSTERECTOMY     BIOPSY  12/04/2016   Procedure: BIOPSY;  Surgeon: Danie Binder, MD;  Location: [facility];  Service: Endoscopy;;  gastric and duodenal biopsy   BIOPSY  07/30/2017   Procedure: BIOPSY;   Surgeon: Danie Binder, MD;  Location: [facility];  Service: Endoscopy;;  random colon bx's   CESAREAN SECTION     x2   CESAREAN SECTION N/A    Phreesia 09/19/2020   CHOLECYSTECTOMY     COLONOSCOPY WITH ESOPHAGOGASTRODUODENOSCOPY (EGD)  2007   Dr. Olevia Perches: normal   COLONOSCOPY WITH PROPOFOL N/A 07/30/2017   Dr. Oneida Alar: Redundant colon, external/internal hemorrhoids. Colon otherwise normal.  Random colon biopsies negative.   ESOPHAGOGASTRODUODENOSCOPY (EGD) WITH PROPOFOL N/A 12/04/2016   Dr. Oneida Alar: Gastritis but no H.pylori, small bowel bx negative. gastric polyps benign fundic gland   HEMORRHOID SURGERY       OB History   No obstetric history on file.     Family History  Problem Relation Age of Onset   ALS Mother    Cardiomyopathy Father    Colon cancer Neg Hx     Social History   Tobacco Use   Smoking status: Former    Packs/day: 0.10    Years: 20.00    Pack years: 2.00    Types: Cigarettes    Quit date: 02/23/2021    Years since quitting: 0.1   Smokeless tobacco: Never  Substance Use Topics   Alcohol use: Yes    Alcohol/week: 0.0 standard drinks    Comment: seldom   Drug use: Yes    Frequency:  2.0 times per week    Types: Marijuana    Comment: marijuana maybe once a week    Home Medications Prior to Admission medications   Medication Sig Start Date End Date Taking? Authorizing Provider  ADDERALL XR 30 MG 24 hr capsule Take 30 mg by mouth daily as needed. For focusing while at work 11/03/16   [provider]  ALPRAZolam Prudy Feeler) 0.5 MG tablet Take 0.5 mg by mouth 2 (two) times daily as needed for anxiety.    [provider]  amLODipine (NORVASC) 5 MG tablet Take 1 tablet (5 mg total) by mouth daily. 03/15/21   Anabel Halon, MD  cetirizine (ZYRTEC) 10 MG tablet Take 1 tablet (10 mg total) by mouth daily. 01/30/21   Anabel Halon, MD  estradiol (CLIMARA - DOSED IN MG/24 HR) 0.05 mg/24hr patch estradiol 0.05 mg/24 hr weekly transdermal  patch  APPLY 1 PATCH BY TRANSDERMAL ROUTE EVERY WEEK    [provider]  fluticasone (FLONASE) 50 MCG/ACT nasal spray Place 1 spray into both nostrils 2 (two) times daily. 01/30/21   Anabel Halon, MD  hydrochlorothiazide (HYDRODIURIL) 25 MG tablet Take 1 tablet (25 mg total) by mouth daily. 03/15/21   Anabel Halon, MD  ibuprofen (ADVIL) 600 MG tablet Take 1 tablet (600 mg total) by mouth every 6 (six) hours as needed. 07/18/20   Joy, Shawn C, PA-C  lidocaine (LIDODERM) 5 % Place 1 patch onto the skin daily. Remove & Discard patch within 12 hours or as directed by MD 07/18/20   Harolyn Rutherford C, PA-C  pantoprazole (PROTONIX) 40 MG tablet Take 1 tablet (40 mg total) by mouth daily. 01/30/21   Anabel Halon, MD  Vitamin D, Ergocalciferol, (DRISDOL) 1.25 MG (50000 UNIT) CAPS capsule Take 1 capsule (50,000 Units total) by mouth every 7 (seven) days. 03/15/21   Anabel Halon, MD    Allergies    Sulfa antibiotics  Review of Systems   Review of Systems  Eyes:  Positive for visual disturbance.  Neurological:  Positive for headaches.  All other systems reviewed and are negative.  Physical Exam Updated Vital Signs BP 133/77   Pulse 94   Temp 97.9 F (36.6 C) (Oral)   Resp 16   Ht 5\' 2"  (1.575 m)   Wt 74.8 kg   LMP 05/21/1997   SpO2 99%   BMI 30.18 kg/m   Physical Exam Vitals and nursing note reviewed.  Constitutional:      General: She is not in acute distress.    Appearance: Normal appearance. She is well-developed.  HENT:     Head: Normocephalic and atraumatic.     Right Ear: Hearing normal.     Left Ear: Hearing normal.     Nose: Nose normal.  Eyes:     General: Lids are normal. Gaze aligned appropriately.     Intraocular pressure: Left eye pressure is 5 mmHg.     Extraocular Movements: Extraocular movements intact.     Right eye: Normal extraocular motion.     Left eye: Normal extraocular motion.     Conjunctiva/sclera: Conjunctivae normal.     Pupils: Pupils are  equal, round, and reactive to light.     Left eye: No fluorescein uptake.  Cardiovascular:     Rate and Rhythm: Regular rhythm.     Heart sounds: S1 normal and S2 normal. No murmur heard.   No friction rub. No gallop.  Pulmonary:     Effort: Pulmonary effort  is normal. No respiratory distress.     Breath sounds: Normal breath sounds.  Chest:     Chest wall: No tenderness.  Abdominal:     General: Bowel sounds are normal.     Palpations: Abdomen is soft.     Tenderness: There is no abdominal tenderness. There is no guarding or rebound. Negative signs include Murphy's sign and McBurney's sign.     Hernia: No hernia is present.  Musculoskeletal:        General: Normal range of motion.     Cervical back: Normal range of motion and neck supple.  Skin:    General: Skin is warm and dry.     Findings: No rash.  Neurological:     Mental Status: She is alert and oriented to person, place, and time.     GCS: GCS eye subscore is 4. GCS verbal subscore is 5. GCS motor subscore is 6.     Cranial Nerves: No cranial nerve deficit.     Sensory: No sensory deficit.     Coordination: Coordination normal.  Psychiatric:        Speech: Speech normal.        Behavior: Behavior normal.        Thought Content: Thought content normal.    ED Results / Procedures / Treatments   Labs (all labs ordered are listed, but only abnormal results are displayed) Labs Reviewed  COMPREHENSIVE METABOLIC PANEL - Abnormal; Notable for the following components:      Result Value   Potassium 2.4 (*)    Calcium 7.7 (*)    Total Protein 5.6 (*)    Albumin 3.1 (*)    All other components within normal limits  I-STAT CHEM 8, ED - Abnormal; Notable for the following components:   Potassium 3.0 (*)    All other components within normal limits  RESP PANEL BY RT-PCR (FLU A&B, COVID) ARPGX2  ETHANOL  PROTIME-INR  APTT  CBC  DIFFERENTIAL  RAPID URINE DRUG SCREEN, HOSP PERFORMED  URINALYSIS, ROUTINE W REFLEX  MICROSCOPIC  SEDIMENTATION RATE  POC URINE PREG, ED    EKG EKG Interpretation  Date/Time:  Tuesday April 18 2021 05:48:49 EST Ventricular Rate:  90 PR Interval:  180 QRS Duration: 121 QT Interval:  387 QTC Calculation: 474 R Axis:   60 Text Interpretation: Sinus rhythm Nonspecific intraventricular conduction delay Confirmed by Orpah Greek [phone]) on 04/18/2021 6:10:20 AM  Radiology No results found.  Procedures Procedures   Medications Ordered in ED Medications  prochlorperazine (COMPAZINE) injection 10 mg (10 mg Intravenous Given 04/18/21 0612)  diphenhydrAMINE (BENADRYL) injection 25 mg (25 mg Intravenous Given 04/18/21 0612)  tetracaine (PONTOCAINE) 0.5 % ophthalmic solution 2 drop (2 drops Left Eye Given 04/18/21 [id number])  fluorescein ophthalmic strip 1 strip (1 strip Left Eye Given 04/18/21 0636)    ED Course  I have reviewed the triage vital signs and the nursing notes.  Pertinent labs & imaging results that were available during my care of the patient were reviewed by me and considered in my medical decision making (see chart for details).    MDM Rules/Calculators/A&P                           Patient presents to the emergency department with complaints of headache, left eye pain and loss of vision in the left eye.  She awakened with symptoms, time of onset is unclear.  She is therefore not a  candidate for code stroke.  I have reviewed her records and she had an identical presentation in 2018.  She had a full work-up including MRI, ophthalmologic examination, neurology examination.  Symptoms were never explained and they did resolve on their own.  Eye examination is unremarkable.  She has normal, equal and reactive pupils.  No fluorescein uptake.  Normal pressure.  Sed rate pending, with previous evaluation sed rate was normal and temporal arteritis was ruled out.  Patient will undergo repeat examination, will also treat for possible migraine and see  if symptoms resolve.  Will sign to the oncoming ER physician to follow-up on work-up.  Final Clinical Impression(s) / ED Diagnoses Final diagnoses:  Bad headache  Vision loss of left eye    Rx / DC Orders ED Discharge Orders     None        Willisha Sligar, Gwenyth Allegra, MD 04/18/21 [phone]

## 2021-04-18 NOTE — ED Triage Notes (Signed)
Pt c/o headache, left eye pain, and nausea since about 0430 this morning. Pt states hx of TIA.

## 2021-04-18 NOTE — Discharge Instructions (Signed)
Your symptoms may be due to a complicated migraine headache.  Take medication prescribed as needed for headache.  However, you will need to follow-up closely with neurology for a repeat evaluation as well as a repeat sed rate blood marker for further assessment.  Please have this done at your earliest convenience.  Your potassium level and magnesium level is low today, take supplementation as prescribed, eat a banana daily for the next week.  Return to the ED if you develop worsening vision changes, pain in your jaw with chewing, or having severe pain to your temporal region.

## 2021-04-18 NOTE — ED Notes (Signed)
Attempted to pull medications from the pixus and it said that pt was not fully registered. Registration called to verify pt's status. Pharmacy called to report issue with pixus.

## 2021-04-18 NOTE — ED Notes (Addendum)
Visual acuity screening completed, patient result 10/80 bilateral, "I have cataracts in both eyes and I can't see"

## 2021-05-08 ENCOUNTER — Ambulatory Visit (INDEPENDENT_AMBULATORY_CARE_PROVIDER_SITE_OTHER): Payer: BC Managed Care – PPO | Admitting: Internal Medicine

## 2021-05-08 ENCOUNTER — Encounter: Payer: Self-pay | Admitting: Internal Medicine

## 2021-05-08 ENCOUNTER — Other Ambulatory Visit: Payer: Self-pay

## 2021-05-08 VITALS — BP 124/86 | HR 83 | Resp 18 | Ht 62.0 in | Wt 172.1 lb

## 2021-05-08 DIAGNOSIS — E876 Hypokalemia: Secondary | ICD-10-CM | POA: Diagnosis not present

## 2021-05-08 DIAGNOSIS — H43813 Vitreous degeneration, bilateral: Secondary | ICD-10-CM | POA: Diagnosis not present

## 2021-05-08 DIAGNOSIS — H35033 Hypertensive retinopathy, bilateral: Secondary | ICD-10-CM | POA: Diagnosis not present

## 2021-05-08 DIAGNOSIS — I10 Essential (primary) hypertension: Secondary | ICD-10-CM

## 2021-05-08 DIAGNOSIS — Z09 Encounter for follow-up examination after completed treatment for conditions other than malignant neoplasm: Secondary | ICD-10-CM | POA: Diagnosis not present

## 2021-05-08 DIAGNOSIS — R700 Elevated erythrocyte sedimentation rate: Secondary | ICD-10-CM | POA: Diagnosis not present

## 2021-05-08 DIAGNOSIS — H33191 Other retinoschisis and retinal cysts, right eye: Secondary | ICD-10-CM | POA: Diagnosis not present

## 2021-05-08 DIAGNOSIS — H2513 Age-related nuclear cataract, bilateral: Secondary | ICD-10-CM | POA: Diagnosis not present

## 2021-05-08 MED ORDER — POTASSIUM CHLORIDE CRYS ER 20 MEQ PO TBCR: 20.0000 meq | EXTENDED_RELEASE_TABLET | Freq: Every day | ORAL | 1 refills | Status: DC

## 2021-05-08 MED ORDER — MAGNESIUM 400 MG PO TABS: 1.0000 | ORAL_TABLET | Freq: Every day | ORAL | 2 refills | Status: DC

## 2021-05-08 MED ORDER — HYDROCHLOROTHIAZIDE 25 MG PO TABS: 25.0000 mg | ORAL_TABLET | Freq: Every day | ORAL | 0 refills | Status: DC

## 2021-05-08 NOTE — Patient Instructions (Signed)
Please start taking Potassium and Magnesium supplement as prescribed.  Please continue to take other medications as prescribed.  Please continue to follow low salt diet and ambulate as tolerated.

## 2021-05-11 DIAGNOSIS — H2512 Age-related nuclear cataract, left eye: Secondary | ICD-10-CM | POA: Diagnosis not present

## 2021-05-12 ENCOUNTER — Encounter (HOSPITAL_COMMUNITY)
Admission: RE | Admit: 2021-05-12 | Discharge: 2021-05-12 | Disposition: A | Discharge status: Home / Self Care | Payer: BC Managed Care – PPO | Source: Ambulatory Visit | Attending: Ophthalmology | Admitting: Ophthalmology

## 2021-05-12 ENCOUNTER — Other Ambulatory Visit: Payer: Self-pay

## 2021-05-12 ENCOUNTER — Encounter (HOSPITAL_COMMUNITY): Payer: Self-pay

## 2021-05-12 DIAGNOSIS — Z09 Encounter for follow-up examination after completed treatment for conditions other than malignant neoplasm: Secondary | ICD-10-CM | POA: Insufficient documentation

## 2021-05-12 DIAGNOSIS — E876 Hypokalemia: Secondary | ICD-10-CM | POA: Insufficient documentation

## 2021-05-12 NOTE — Assessment & Plan Note (Signed)
BP Readings from Last 1 Encounters:  05/08/21 124/86   Well-controlled with HCTZ to 25 mg daily and amlodipine 5 mg daily now Counseled for compliance with the medications Advised DASH diet and moderate exercise/walking, at least 150 mins/week

## 2021-05-12 NOTE — Progress Notes (Signed)
Acute Office Visit  Subjective:    Patient ID: [name] [name], female    DOB: [birth year], 55 y.o.   MRN: [id number]  Chief Complaint  Patient presents with   Follow-up    Follow up pt went to ER for migraines fatigued no appetite saw on blood work low magnesium and potassium    HPI Patient is in today for follow-up after recent ER visit for fatigue and blurred vision.  She was given potassium and magnesium supplements after checking BMP in ER.  Her ESR was elevated in the ER, and neurology was contacted, who recommended outpatient eval.  Her vision has improved now.  She is concerned about her low potassium and magnesium.  She did have mild nausea on the day of ER visit, but denies any vomiting or diarrhea since ER visit.  She denies any headache, although ER chart suggest that she had headache on the day of ER visit.  HTN: Her BP is better controlled now with HCTZ and amlodipine.  She denies any leg swelling now.  She understands that HCTZ could be provoking hypokalemia, but she wants to continue taking it due to leg swelling.  She agrees to take potassium and magnesium supplements for now.  Past Medical History:  Diagnosis Date   Allergy    Phreesia 09/19/2020   Anxiety    Bradycardia 03/02/2015   Depression    GERD (gastroesophageal reflux disease)    History of hysterectomy    History of oophorectomy    Hx of cholecystectomy    Hypertension    IDA (iron deficiency anemia)    Near syncope 03/02/2015   Sciatica neuralgia, right    Stroke ([place])    "light stroke 08/2015. No deficits   Tobacco abuse 03/03/2015   Vertigo     Past Surgical History:  Procedure Laterality Date   ABDOMINAL HYSTERECTOMY     BIOPSY  12/04/2016   Procedure: BIOPSY;  Surgeon: Danie Binder, MD;  Location: [facility];  Service: Endoscopy;;  gastric and duodenal biopsy   BIOPSY  07/30/2017   Procedure: BIOPSY;  Surgeon: Danie Binder, MD;  Location: [facility];  Service: Endoscopy;;  random  colon bx's   CESAREAN SECTION     x2   CESAREAN SECTION N/A    Phreesia 09/19/2020   CHOLECYSTECTOMY     COLONOSCOPY WITH ESOPHAGOGASTRODUODENOSCOPY (EGD)  2007   Dr. Olevia Perches: normal   COLONOSCOPY WITH PROPOFOL N/A 07/30/2017   Dr. Oneida Alar: Redundant colon, external/internal hemorrhoids. Colon otherwise normal.  Random colon biopsies negative.   ESOPHAGOGASTRODUODENOSCOPY (EGD) WITH PROPOFOL N/A 12/04/2016   Dr. Oneida Alar: Gastritis but no H.pylori, small bowel bx negative. gastric polyps benign fundic gland   HEMORRHOID SURGERY      Family History  Problem Relation Age of Onset   ALS Mother    Cardiomyopathy Father    Colon cancer Neg Hx     Social History   Socioeconomic History   Marital status: Married    Spouse name: Not on file   Number of children: Not on file   Years of education: Not on file   Highest education level: Not on file  Occupational History   Not on file  Tobacco Use   Smoking status: Former    Packs/day: 0.10    Years: 20.00    Pack years: 2.00    Types: Cigarettes    Quit date: 02/23/2021    Years since quitting: 0.2   Smokeless tobacco: Never  Vaping Use   Vaping Use: Never used  Substance and Sexual Activity   Alcohol use: Yes    Alcohol/week: 0.0 standard drinks    Comment: seldom   Drug use: Yes    Frequency: 2.0 times per week    Types: Marijuana    Comment: marijuana maybe once a week   Sexual activity: Yes    Birth control/protection: Surgical  Other Topics Concern   Not on file  Social History Narrative   Drinks 5-6 cups of coffee daily.   Social Determinants of Health   Financial Resource Strain: Not on file  Food Insecurity: Not on file  Transportation Needs: Not on file  Physical Activity: Not on file  Stress: Not on file  Social Connections: Not on file  Intimate Partner Violence: Not on file    Outpatient Medications Prior to Visit  Medication Sig Dispense Refill   ADDERALL XR 30 MG 24 hr capsule Take 30 mg by mouth  daily as needed. For focusing while at work     ALPRAZolam Duanne Moron) 0.5 MG tablet Take 0.5 mg by mouth 2 (two) times daily as needed for anxiety.     amLODipine (NORVASC) 5 MG tablet Take 1 tablet (5 mg total) by mouth daily. 90 tablet 0   butalbital-acetaminophen-caffeine (FIORICET) 50-325-40 MG tablet Take 1-2 tablets by mouth every 6 (six) hours as needed for headache. 20 tablet 0   cetirizine (ZYRTEC) 10 MG tablet Take 1 tablet (10 mg total) by mouth daily. 90 tablet 1   estradiol (CLIMARA - DOSED IN MG/24 HR) 0.05 mg/24hr patch estradiol 0.05 mg/24 hr weekly transdermal patch  APPLY 1 PATCH BY TRANSDERMAL ROUTE EVERY WEEK     fluticasone (FLONASE) 50 MCG/ACT nasal spray Place 1 spray into both nostrils 2 (two) times daily. 9.9 mL 5   ibuprofen (ADVIL) 600 MG tablet Take 1 tablet (600 mg total) by mouth every 6 (six) hours as needed. 30 tablet 0   lidocaine (LIDODERM) 5 % Place 1 patch onto the skin daily. Remove & Discard patch within 12 hours or as directed by MD 30 patch 0   pantoprazole (PROTONIX) 40 MG tablet Take 1 tablet (40 mg total) by mouth daily. 90 tablet 1   Vitamin D, Ergocalciferol, (DRISDOL) 1.25 MG (50000 UNIT) CAPS capsule Take 1 capsule (50,000 Units total) by mouth every 7 (seven) days. 5 capsule 5   hydrochlorothiazide (HYDRODIURIL) 25 MG tablet Take 1 tablet (25 mg total) by mouth daily. 90 tablet 0   No facility-administered medications prior to visit.    Allergies  Allergen Reactions   Sulfa Antibiotics Hives and Nausea And Vomiting    Review of Systems  Constitutional:  Negative for chills and fever.  HENT:  Negative for congestion, sinus pressure, sinus pain and sore throat.   Eyes:  Negative for pain and discharge.  Respiratory:  Negative for cough and shortness of breath.   Cardiovascular:  Negative for chest pain and palpitations.  Gastrointestinal:  Positive for nausea. Negative for abdominal pain, diarrhea and vomiting.  Endocrine: Negative for  polydipsia and polyuria.  Genitourinary:  Negative for dysuria and hematuria.  Musculoskeletal:  Positive for arthralgias. Negative for neck pain and neck stiffness.  Skin:  Negative for rash.  Neurological:  Negative for dizziness and weakness.  Psychiatric/Behavioral:  Negative for agitation and behavioral problems. The patient is nervous/anxious.       Objective:    Physical Exam Vitals reviewed.  Constitutional:      General: She  is not in acute distress.    Appearance: She is not diaphoretic.  HENT:     Head: Normocephalic and atraumatic.     Nose: Nose normal.     Mouth/Throat:     Mouth: Mucous membranes are moist.  Eyes:     General: No scleral icterus.    Extraocular Movements: Extraocular movements intact.  Cardiovascular:     Rate and Rhythm: Normal rate and regular rhythm.     Pulses: Normal pulses.     Heart sounds: Normal heart sounds. No murmur heard. Pulmonary:     Breath sounds: Normal breath sounds. No wheezing or rales.  Abdominal:     Palpations: Abdomen is soft.     Tenderness: There is no abdominal tenderness.  Musculoskeletal:     Cervical back: Neck supple. No tenderness.     Right lower leg: No edema.     Left lower leg: No edema.  Skin:    General: Skin is warm.     Findings: No rash.  Neurological:     General: No focal deficit present.     Mental Status: She is alert and oriented to person, place, and time.  Psychiatric:        Mood and Affect: Mood normal.        Behavior: Behavior normal.    BP 124/86 (BP Location: Left Arm, Patient Position: Sitting, Cuff Size: Normal)    Pulse 83    Resp 18    Ht 5' 2"  (1.575 m)    Wt 172 lb 1.3 oz (78.1 kg)    LMP 05/21/1997    SpO2 100%    BMI 31.47 kg/m  Wt Readings from Last 3 Encounters:  05/12/21 172 lb 1.3 oz (78.1 kg)  05/08/21 172 lb 1.3 oz (78.1 kg)  04/18/21 165 lb (74.8 kg)        Assessment & Plan:   Problem List Items Addressed This Visit       Cardiovascular and Mediastinum    Essential hypertension    BP Readings from Last 1 Encounters:  05/08/21 124/86  Well-controlled with HCTZ to 25 mg daily and amlodipine 5 mg daily now Counseled for compliance with the medications Advised DASH diet and moderate exercise/walking, at least 150 mins/week       Relevant Medications   hydrochlorothiazide (HYDRODIURIL) 25 MG tablet     Other   Hypokalemia    Could be due to dehydration Started Klor-con for now If persistent, will have to DC HCTZ      Relevant Medications   potassium chloride SA (KLOR-CON M) 20 MEQ tablet   Other Relevant Orders   Basic Metabolic Panel (BMET)   Encounter for examination following treatment at hospital - Primary    ER chart reviewed including blood tests Repeat BMP and ESR      Other Visit Diagnoses     Hypomagnesemia       Relevant Medications   Magnesium 400 MG TABS   Other Relevant Orders   Magnesium   Elevated sed rate       Relevant Orders   Sed Rate (ESR)        Meds ordered this encounter  Medications   potassium chloride SA (KLOR-CON M) 20 MEQ tablet    Sig: Take 1 tablet (20 mEq total) by mouth daily.    Dispense:  90 tablet    Refill:  1   Magnesium 400 MG TABS    Sig: Take 1 tablet by mouth daily.  Dispense:  100 tablet    Refill:  2   hydrochlorothiazide (HYDRODIURIL) 25 MG tablet    Sig: Take 1 tablet (25 mg total) by mouth daily.    Dispense:  90 tablet    Refill:  0    Please cancel the HCTZ 12.5 mg prescription.     Lindell Spar, MD

## 2021-05-12 NOTE — Assessment & Plan Note (Signed)
ER chart reviewed including blood tests Repeat BMP and ESR

## 2021-05-12 NOTE — Assessment & Plan Note (Signed)
Could be due to dehydration Started Klor-con for now If persistent, will have to DC HCTZ 

## 2021-05-16 ENCOUNTER — Ambulatory Visit: Payer: BC Managed Care – PPO | Admitting: Internal Medicine

## 2021-05-16 NOTE — H&P (Signed)
Surgical History & Physical  Patient Name: [name] [name] DOB: Aug 03, 1965  Surgery: Cataract extraction with intraocular lens implant phacoemulsification; Left Eye  Surgeon: Fabio Pierce MD Surgery Date:  05-19-21 Pre-Op Date:  05-11-21  HPI: A 47 Yr. old female patient Pt referred by Dr. Lucretia Roers for second opinion for cataract surgery. The patient complains of difficulty when viewing TV, reading closed caption, news scrolls on TV, which began 3 years ago. Both eyes are affected. The episode is gradual. The condition's severity is worsening. The complaint is associated with blurry vision and glare. Symptoms are negatively affecting pt's quality of life. Pt states she has noticed a increase in floaters and flashes of light over the past month. Unsure which eye, possibly both. Pt state they are stable now. Pt currently using Systane prn OU. Pt denies any recent eye pain, cobweb or curtaining. HPI Completed by Dr. Fabio Pierce  Medical History: Dry Eyes Cataracts Macula Degeneration Glaucoma Anxiety Disorder Acid Reflux High Blood Pressure  Review of Systems Negative Allergic/Immunologic Negative Cardiovascular Negative Constitutional Negative Ear, Nose, Mouth & Throat Negative Endocrine Negative Eyes Negative Gastrointestinal Negative Genitourinary Negative Hemotologic/Lymphatic Negative Integumentary Negative Musculoskeletal Negative Neurological Negative Psychiatry Negative Respiratory  Social   Former smoker   Medication Systane, Pantoprazole, Adderall, Hydrochlorothiazide, Xanax,   Sx/Procedures  None  Drug Allergies  Sulfa (sulfonamide antibiotics),   History & Physical: Heent: Cataract, Left Eye NECK: supple without bruits LUNGS: lungs clear to auscultation CV: regular rate and rhythm Abdomen: soft and non-tender  Impression & Plan: Assessment: 1.  NUCLEAR SCLEROSIS AGE RELATED; Both Eyes (H25.13) 2.  BLEPHARITIS; Right Upper Lid, Right Lower Lid, Left Upper  Lid, Left Lower Lid (H01.001, H01.002,H01.004,H01.005) 3.  Pinguecula; Both Eyes (H11.153) 4.  Myopia ; Both Eyes (H52.13) 5.  Presbyopia ; Both Eyes (H52.4) 6.  RETINOSCHISIS DEGENERATIVE; Right Eye (H33.191) 7.  ASTIGMATISM, REGULAR; Both Eyes (H52.223)  Plan: 1.  Cataract accounts for the patient's decreased vision. This visual impairment is not correctable with a tolerable change in glasses or contact lenses. Cataract surgery with an implantation of a new lens should significantly improve the visual and functional status of the patient. Discussed all risks, benefits, alternatives, and potential complications. Discussed the procedures and recovery. Patient desires to have surgery. A-scan ordered and performed today for intra-ocular lens calculations. The surgery will be performed in order to improve vision for driving, reading, and for eye examinations. Recommend phacoemulsification with intra-ocular lens. Recommend Dextenza for post-operative pain and inflammation. Left Eye. Dilates well - shugarcaine by protocol. Toric Lens.  2.  Recommend regular lid cleaning.  3.  Observe; Artificial tears as needed for irritation.  4.   5.   6.  Seen on OCT. Will want retinal clearance prior to surgery..  7.  Recommend toric IOL.

## 2021-05-19 ENCOUNTER — Encounter (HOSPITAL_COMMUNITY)
Admission: RE | Disposition: A | Discharge status: Home / Self Care | Payer: Self-pay | Source: Home / Self Care | Attending: Ophthalmology

## 2021-05-19 ENCOUNTER — Encounter (HOSPITAL_COMMUNITY): Payer: Self-pay | Admitting: Ophthalmology

## 2021-05-19 ENCOUNTER — Ambulatory Visit (HOSPITAL_COMMUNITY)
Admission: RE | Admit: 2021-05-19 | Discharge: 2021-05-19 | Disposition: A | Discharge status: Home / Self Care | Payer: BC Managed Care – PPO | Attending: Ophthalmology | Admitting: Ophthalmology

## 2021-05-19 ENCOUNTER — Ambulatory Visit (HOSPITAL_COMMUNITY): Payer: BC Managed Care – PPO | Admitting: Anesthesiology

## 2021-05-19 DIAGNOSIS — Z8673 Personal history of transient ischemic attack (TIA), and cerebral infarction without residual deficits: Secondary | ICD-10-CM | POA: Insufficient documentation

## 2021-05-19 DIAGNOSIS — H33191 Other retinoschisis and retinal cysts, right eye: Secondary | ICD-10-CM | POA: Insufficient documentation

## 2021-05-19 DIAGNOSIS — D649 Anemia, unspecified: Secondary | ICD-10-CM | POA: Diagnosis not present

## 2021-05-19 DIAGNOSIS — F418 Other specified anxiety disorders: Secondary | ICD-10-CM | POA: Diagnosis not present

## 2021-05-19 DIAGNOSIS — G709 Myoneural disorder, unspecified: Secondary | ICD-10-CM | POA: Insufficient documentation

## 2021-05-19 DIAGNOSIS — H52222 Regular astigmatism, left eye: Secondary | ICD-10-CM | POA: Diagnosis not present

## 2021-05-19 DIAGNOSIS — Z87891 Personal history of nicotine dependence: Secondary | ICD-10-CM | POA: Diagnosis not present

## 2021-05-19 DIAGNOSIS — H0100B Unspecified blepharitis left eye, upper and lower eyelids: Secondary | ICD-10-CM | POA: Diagnosis not present

## 2021-05-19 DIAGNOSIS — D759 Disease of blood and blood-forming organs, unspecified: Secondary | ICD-10-CM | POA: Diagnosis not present

## 2021-05-19 DIAGNOSIS — H524 Presbyopia: Secondary | ICD-10-CM | POA: Insufficient documentation

## 2021-05-19 DIAGNOSIS — F32A Depression, unspecified: Secondary | ICD-10-CM | POA: Insufficient documentation

## 2021-05-19 DIAGNOSIS — H11153 Pinguecula, bilateral: Secondary | ICD-10-CM | POA: Diagnosis not present

## 2021-05-19 DIAGNOSIS — K219 Gastro-esophageal reflux disease without esophagitis: Secondary | ICD-10-CM | POA: Insufficient documentation

## 2021-05-19 DIAGNOSIS — H2512 Age-related nuclear cataract, left eye: Secondary | ICD-10-CM | POA: Diagnosis not present

## 2021-05-19 DIAGNOSIS — I10 Essential (primary) hypertension: Secondary | ICD-10-CM | POA: Diagnosis not present

## 2021-05-19 DIAGNOSIS — H0100A Unspecified blepharitis right eye, upper and lower eyelids: Secondary | ICD-10-CM | POA: Diagnosis not present

## 2021-05-19 DIAGNOSIS — F419 Anxiety disorder, unspecified: Secondary | ICD-10-CM | POA: Diagnosis not present

## 2021-05-19 DIAGNOSIS — H5213 Myopia, bilateral: Secondary | ICD-10-CM | POA: Diagnosis not present

## 2021-05-19 DIAGNOSIS — H52223 Regular astigmatism, bilateral: Secondary | ICD-10-CM | POA: Diagnosis not present

## 2021-05-19 HISTORY — PX: CATARACT EXTRACTION W/PHACO: SHX586

## 2021-05-19 SURGERY — PHACOEMULSIFICATION, CATARACT, WITH IOL INSERTION
Anesthesia: Monitor Anesthesia Care | Site: Eye | Laterality: Left

## 2021-05-19 MED ORDER — STERILE WATER FOR IRRIGATION IR SOLN
Status: DC | PRN
  Administered 2021-05-19: 250 mL

## 2021-05-19 MED ORDER — PHENYLEPHRINE HCL 2.5 % OP SOLN
1.0000 [drp] | OPHTHALMIC | Status: AC | PRN
  Administered 2021-05-19 (×3): 1 [drp] via OPHTHALMIC

## 2021-05-19 MED ORDER — EPINEPHRINE PF 1 MG/ML IJ SOLN
INTRAMUSCULAR | Status: AC
  Filled 2021-05-19: qty 2

## 2021-05-19 MED ORDER — TETRACAINE HCL 0.5 % OP SOLN
1.0000 [drp] | OPHTHALMIC | Status: AC | PRN
  Administered 2021-05-19 (×3): 1 [drp] via OPHTHALMIC

## 2021-05-19 MED ORDER — MIDAZOLAM HCL 2 MG/2ML IJ SOLN
INTRAMUSCULAR | Status: AC
  Filled 2021-05-19: qty 2

## 2021-05-19 MED ORDER — POVIDONE-IODINE 5 % OP SOLN
OPHTHALMIC | Status: DC | PRN
  Administered 2021-05-19: 1 via OPHTHALMIC

## 2021-05-19 MED ORDER — MIDAZOLAM HCL 5 MG/5ML IJ SOLN
INTRAMUSCULAR | Status: DC | PRN
  Administered 2021-05-19: 1 mg via INTRAVENOUS
  Administered 2021-05-19: 2 mg via INTRAVENOUS

## 2021-05-19 MED ORDER — SODIUM HYALURONATE 23MG/ML IO SOSY
PREFILLED_SYRINGE | INTRAOCULAR | Status: DC | PRN
  Administered 2021-05-19: 0.6 mL via INTRAOCULAR

## 2021-05-19 MED ORDER — SODIUM CHLORIDE 0.9% FLUSH
INTRAVENOUS | Status: DC | PRN
  Administered 2021-05-19 (×2): 5 mL via INTRAVENOUS

## 2021-05-19 MED ORDER — LIDOCAINE HCL (PF) 1 % IJ SOLN
INTRAOCULAR | Status: DC | PRN
  Administered 2021-05-19: 11:00:00 1 mL via OPHTHALMIC

## 2021-05-19 MED ORDER — ARTIFICIAL TEARS OPHTHALMIC OINT
TOPICAL_OINTMENT | OPHTHALMIC | Status: AC
  Filled 2021-05-19: qty 3.5

## 2021-05-19 MED ORDER — TROPICAMIDE 1 % OP SOLN
1.0000 [drp] | OPHTHALMIC | Status: AC | PRN
  Administered 2021-05-19 (×3): 1 [drp] via OPHTHALMIC
  Filled 2021-05-19: qty 2

## 2021-05-19 MED ORDER — BSS IO SOLN
INTRAOCULAR | Status: DC | PRN
  Administered 2021-05-19: 15 mL via INTRAOCULAR

## 2021-05-19 MED ORDER — EPINEPHRINE PF 1 MG/ML IJ SOLN
INTRAOCULAR | Status: DC | PRN
  Administered 2021-05-19: 11:00:00 500 mL

## 2021-05-19 MED ORDER — LIDOCAINE HCL 3.5 % OP GEL: 1.0000 "application " | Freq: Once | OPHTHALMIC | Status: DC

## 2021-05-19 MED ORDER — SODIUM HYALURONATE 10 MG/ML IO SOLUTION
PREFILLED_SYRINGE | INTRAOCULAR | Status: DC | PRN
  Administered 2021-05-19: 0.85 mL via INTRAOCULAR

## 2021-05-19 SURGICAL SUPPLY — 14 items
CATARACT SUITE SIGHTPATH (MISCELLANEOUS) ×1 IMPLANT
CLOTH BEACON ORANGE TIMEOUT ST (SAFETY) ×1 IMPLANT
EYE SHIELD UNIVERSAL CLEAR (GAUZE/BANDAGES/DRESSINGS) ×1 IMPLANT
GLOVE SURG UNDER POLY LF SZ7 (GLOVE) ×2 IMPLANT
NDL HYPO 18GX1.5 BLUNT FILL (NEEDLE) IMPLANT
NEEDLE HYPO 18GX1.5 BLUNT FILL (NEEDLE) ×2 IMPLANT
PAD ARMBOARD 7.5X6 YLW CONV (MISCELLANEOUS) ×1 IMPLANT
PROC W SPEC LENS (INTRAOCULAR LENS)
PROCESS W SPEC LENS (INTRAOCULAR LENS) IMPLANT
SYR TB 1ML LL NO SAFETY (SYRINGE) ×1 IMPLANT
TAPE SURG TRANSPORE 1 IN (GAUZE/BANDAGES/DRESSINGS) IMPLANT
TAPE SURGICAL TRANSPORE 1 IN (GAUZE/BANDAGES/DRESSINGS) ×2
Tecnis Toric II IOL (Intraocular Lens) ×1 IMPLANT
WATER STERILE IRR 250ML POUR (IV SOLUTION) ×1 IMPLANT

## 2021-05-19 NOTE — Discharge Instructions (Addendum)
Please discharge patient when stable, will follow up today with Dr. Wrzosek at the [facility] [place] office immediately following discharge.  Leave shield in place until visit.  All paperwork with discharge instructions will be given at the office.  [facility] Address:  [address]  [place], [place] [address]  

## 2021-05-19 NOTE — Interval H&P Note (Signed)
History and Physical Interval Note:  05/19/2021 11:01 AM  [name] [name]  has presented today for surgery, with the diagnosis of nuclear sclerosis age related cataract; left eye.  The various methods of treatment have been discussed with the patient and family. After consideration of risks, benefits and other options for treatment, the patient has consented to  Procedure(s) with comments: CATARACT EXTRACTION PHACO AND INTRAOCULAR LENS PLACEMENT (IOC) (Left) - left as a surgical intervention.  The patient's history has been reviewed, patient examined, no change in status, stable for surgery.  I have reviewed the patient's chart and labs.  Questions were answered to the patient's satisfaction.     Fabio Pierce

## 2021-05-19 NOTE — Op Note (Signed)
Date of procedure: 05/19/21  Pre-operative diagnosis: Visually significant age-related nuclear cataract, Left Eye; Visually Significant Astigmatism, Left Eye (H25.12)  Post-operative diagnosis: Visually significant age-related nuclear cataract, Left Eye; Visually Significant Astigmatism, Left Eye  Procedure: Removal of cataract via phacoemulsification and insertion of intra-ocular lens Wynetta Emery and Johnson [id number] +13.5D into the capsular bag of the Left Eye  Attending surgeon: Gerda Diss. Rajean Desantiago, MD, MA  Anesthesia: MAC, Topical Akten  Complications: None  Estimated Blood Loss: <71m (minimal)  Specimens: None  Implants: As above  Indications:  Visually significant age-related cataract, Left Eye; Visually Significant Astigmatism, Left Eye  Procedure:  The patient was seen and identified in the pre-operative area. The operative eye was identified and dilated.  The operative eye was marked.  Pre-operative toric markers were used to mark the eye at 0 and 180 degrees. Topical anesthesia was administered to the operative eye.     The patient was then to the operative suite and placed in the supine position.  A timeout was performed confirming the patient, procedure to be performed, and all other relevant information.   The patient's face was prepped and draped in the usual fashion for intra-ocular surgery.  A lid speculum was placed into the operative eye and the surgical microscope moved into place and focused.  A superotemporal paracentesis was created using a 20 gauge paracentesis blade.  Shugarcaine was injected into the anterior chamber.  Viscoelastic was injected into the anterior chamber.  A temporal clear-corneal main wound incision was created using a 2.496mmicrokeratome.  A continuous curvilinear capsulorrhexis was initiated using an irrigating cystitome and completed using capsulorrhexis forceps.  Hydrodissection and hydrodeliniation were performed.  Viscoelastic was injected into the  anterior chamber.  A phacoemulsification handpiece and a chopper as a second instrument were used to remove the nucleus and epinucleus. The irrigation/aspiration handpiece was used to remove any remaining cortical material.   The capsular bag was reinflated with viscoelastic, checked, and found to be intact.  The eye was marked to the per-op meridian.  The intraocular lens was inserted into the capsular bag and dialed into place using a Kuglen hook to 84 degrees.  The irrigation/aspiration handpiece was used to remove any remaining viscoelastic.  The clear corneal wound and paracentesis wounds were then hydrated and checked with Weck-Cels to be watertight.  The lid-speculum and drape was removed, and the patient's face was cleaned with a wet and dry 4x4. A clear shield was taped over the eye. The patient was taken to the post-operative care unit in good condition, having tolerated the procedure well.  Post-Op Instructions: [facility] and will receive all other orders and instructions.

## 2021-05-19 NOTE — Anesthesia Postprocedure Evaluation (Signed)
Anesthesia Post Note  Patient: [name] [name]  Procedure(s) Performed: CATARACT EXTRACTION PHACO AND INTRAOCULAR LENS PLACEMENT (IOC) (Left: Eye)  Patient location during evaluation: Short Stay Anesthesia Type: MAC Level of consciousness: awake and alert Pain management: pain level controlled Vital Signs Assessment: post-procedure vital signs reviewed and stable Respiratory status: spontaneous breathing Cardiovascular status: blood pressure returned to baseline and stable Postop Assessment: no apparent nausea or vomiting Anesthetic complications: no   No notable events documented.   Last Vitals:  Vitals:   05/19/21 0952  BP: 115/81  Pulse: 78  Resp: 16  Temp: 36.6 C  SpO2: 98%    Last Pain:  Vitals:   05/19/21 0952  TempSrc: Oral  PainSc: 0-No pain                 Jayni Prescher

## 2021-05-19 NOTE — Anesthesia Preprocedure Evaluation (Signed)
Anesthesia Evaluation  Patient identified by MRN, date of birth, ID band Patient awake    Reviewed: Allergy & Precautions, H&P , NPO status , Patient's Chart, lab work & pertinent test results, reviewed documented beta blocker date and time   Airway Mallampati: II  TM Distance: >3 FB Neck ROM: full    Dental no notable dental hx.    Pulmonary neg pulmonary ROS, former smoker,    Pulmonary exam normal breath sounds clear to auscultation       Cardiovascular Exercise Tolerance: Good hypertension, negative cardio ROS   Rhythm:regular Rate:Normal     Neuro/Psych PSYCHIATRIC DISORDERS Anxiety Depression  Neuromuscular disease CVA, No Residual Symptoms    GI/Hepatic Neg liver ROS, GERD  Medicated,  Endo/Other  negative endocrine ROS  Renal/GU negative Renal ROS  negative genitourinary   Musculoskeletal   Abdominal   Peds  Hematology  (+) Blood dyscrasia, anemia ,   Anesthesia Other Findings   Reproductive/Obstetrics negative OB ROS                             Anesthesia Physical Anesthesia Plan  ASA: 3  Anesthesia Plan: MAC   Post-op Pain Management:    Induction:   PONV Risk Score and Plan:   Airway Management Planned:   Additional Equipment:   Intra-op Plan:   Post-operative Plan:   Informed Consent: I have reviewed the patients History and Physical, chart, labs and discussed the procedure including the risks, benefits and alternatives for the proposed anesthesia with the patient or authorized representative who has indicated his/her understanding and acceptance.     Dental Advisory Given  Plan Discussed with: CRNA  Anesthesia Plan Comments:         Anesthesia Quick Evaluation

## 2021-05-19 NOTE — Transfer of Care (Signed)
Immediate Anesthesia Transfer of Care Note  Patient: [name] [name]  Procedure(s) Performed: CATARACT EXTRACTION PHACO AND INTRAOCULAR LENS PLACEMENT (IOC) (Left: Eye)  Patient Location: Short Stay  Anesthesia Type:MAC  Level of Consciousness: awake  Airway & Oxygen Therapy: Patient Spontanous Breathing  Post-op Assessment: Report given to RN  Post vital signs: Reviewed  Last Vitals:  Vitals Value Taken Time  BP    Temp    Pulse    Resp    SpO2      Last Pain:  Vitals:   05/19/21 0952  TempSrc: Oral  PainSc: 0-No pain         Complications: No notable events documented.

## 2021-05-21 ENCOUNTER — Other Ambulatory Visit: Payer: Self-pay

## 2021-05-21 ENCOUNTER — Ambulatory Visit
Admission: EM | Admit: 2021-05-21 | Discharge: 2021-05-21 | Disposition: A | Discharge status: Home / Self Care | Payer: BC Managed Care – PPO | Attending: Family Medicine | Admitting: Family Medicine

## 2021-05-21 DIAGNOSIS — R062 Wheezing: Secondary | ICD-10-CM | POA: Diagnosis not present

## 2021-05-21 DIAGNOSIS — J069 Acute upper respiratory infection, unspecified: Secondary | ICD-10-CM | POA: Diagnosis not present

## 2021-05-21 DIAGNOSIS — Z20828 Contact with and (suspected) exposure to other viral communicable diseases: Secondary | ICD-10-CM

## 2021-05-21 MED ORDER — PROMETHAZINE-DM 6.25-15 MG/5ML PO SYRP: 5.0000 mL | ORAL_SOLUTION | Freq: Four times a day (QID) | ORAL | 0 refills | Status: DC | PRN

## 2021-05-21 MED ORDER — PREDNISONE 20 MG PO TABS: 40.0000 mg | ORAL_TABLET | Freq: Every day | ORAL | 0 refills | Status: DC

## 2021-05-21 MED ORDER — ALBUTEROL SULFATE HFA 108 (90 BASE) MCG/ACT IN AERS: 2.0000 | INHALATION_SPRAY | Freq: Four times a day (QID) | RESPIRATORY_TRACT | 0 refills | Status: DC | PRN

## 2021-05-21 MED ORDER — MOLNUPIRAVIR EUA 200MG CAPSULE: 4.0000 | ORAL_CAPSULE | Freq: Two times a day (BID) | ORAL | 0 refills | Status: DC

## 2021-05-21 MED ORDER — MOLNUPIRAVIR EUA 200MG CAPSULE: 4.0000 | ORAL_CAPSULE | Freq: Two times a day (BID) | ORAL | 0 refills | Status: AC

## 2021-05-21 NOTE — ED Provider Notes (Signed)
[facility]    CSN: [id number] Arrival date & time: 05/21/21  1123      History   Chief Complaint Chief Complaint  Patient presents with   Shortness of Breath    Cough and SOB    HPI GOLDIA BOECK is a 56 y.o. female.   Presenting today with 2-day history of nasal congestion, productive cough, body aches, chills, fatigue, now wheezing and chest tightness additionally.  Denies shortness of breath, abdominal pain, nausea vomiting or diarrhea.  Taking over-the-counter cough medication with minimal relief so far.  Does have a history of seasonal allergies on antihistamine therapy, no known history of pulmonary disease.  No known sick contacts recently.  Past Medical History:  Diagnosis Date   Allergy    Phreesia 09/19/2020   Anxiety    Bradycardia 03/02/2015   Depression    GERD (gastroesophageal reflux disease)    History of hysterectomy    History of oophorectomy    Hx of cholecystectomy    Hypertension    IDA (iron deficiency anemia)    Near syncope 03/02/2015   Sciatica neuralgia, right    Stroke ([place])    "light stroke 08/2015. No deficits   Tobacco abuse 03/03/2015   Vertigo    Patient Active Problem List   Diagnosis Date Noted   Hypokalemia 05/12/2021   Encounter for examination following treatment at hospital 05/12/2021   Vitamin D deficiency 03/15/2021   Mixed hyperlipidemia 03/15/2021   Prediabetes 03/15/2021   Essential hypertension 01/30/2021   Allergic rhinitis 01/30/2021   ADD (attention deficit disorder) 09/19/2020   Anxiety 09/19/2020   Leg edema 09/19/2020   Pulmonary nodule 09/19/2020   Obesity (BMI 30.0-34.9) 09/19/2020   GERD (gastroesophageal reflux disease) 09/07/2016   Vision loss, left eye 09/07/2016   Snoring 04/19/2015   Tobacco abuse 03/03/2015   Chronic diarrhea 08/01/2007   Past Surgical History:  Procedure Laterality Date   ABDOMINAL HYSTERECTOMY     BIOPSY  12/04/2016   Procedure: BIOPSY;  Surgeon: Danie Binder,  MD;  Location: [facility];  Service: Endoscopy;;  gastric and duodenal biopsy   BIOPSY  07/30/2017   Procedure: BIOPSY;  Surgeon: Danie Binder, MD;  Location: [facility];  Service: Endoscopy;;  random colon bx's   CESAREAN SECTION     x2   CESAREAN SECTION N/A    Phreesia 09/19/2020   CHOLECYSTECTOMY     COLONOSCOPY WITH ESOPHAGOGASTRODUODENOSCOPY (EGD)  2007   Dr. Olevia Perches: normal   COLONOSCOPY WITH PROPOFOL N/A 07/30/2017   Dr. Oneida Alar: Redundant colon, external/internal hemorrhoids. Colon otherwise normal.  Random colon biopsies negative.   ESOPHAGOGASTRODUODENOSCOPY (EGD) WITH PROPOFOL N/A 12/04/2016   Dr. Oneida Alar: Gastritis but no H.pylori, small bowel bx negative. gastric polyps benign fundic gland   HEMORRHOID SURGERY     OB History   No obstetric history on file.     Home Medications    Prior to Admission medications   Medication Sig Start Date End Date Taking? Authorizing Provider  ADDERALL XR 30 MG 24 hr capsule Take 30 mg by mouth daily as needed. For focusing while at work 11/03/16   [provider]  albuterol (VENTOLIN HFA) 108 (90 Base) MCG/ACT inhaler Inhale 2 puffs into the lungs every 6 (six) hours as needed for wheezing or shortness of breath. 05/21/21   Volney American, PA-C  ALPRAZolam Duanne Moron) 0.5 MG tablet Take 0.5 mg by mouth 2 (two) times daily as needed for anxiety.  [provider]  amLODipine (NORVASC) 5 MG tablet Take 1 tablet (5 mg total) by mouth daily. 03/15/21   Lindell Spar, MD  butalbital-acetaminophen-caffeine (FIORICET) [phone] MG tablet Take 1-2 tablets by mouth every 6 (six) hours as needed for headache. 04/18/21 04/18/22  Domenic Moras, PA-C  cetirizine (ZYRTEC) 10 MG tablet Take 1 tablet (10 mg total) by mouth daily. 01/30/21   Lindell Spar, MD  estradiol (CLIMARA - DOSED IN MG/24 HR) 0.05 mg/24hr patch estradiol 0.05 mg/24 hr weekly transdermal patch  APPLY 1 PATCH BY TRANSDERMAL ROUTE EVERY WEEK    [provider]  fluticasone (FLONASE) 50 MCG/ACT nasal spray Place 1 spray into both nostrils 2 (two) times daily. 01/30/21   Lindell Spar, MD  hydrochlorothiazide (HYDRODIURIL) 25 MG tablet Take 1 tablet (25 mg total) by mouth daily. 05/08/21   Lindell Spar, MD  ibuprofen (ADVIL) 600 MG tablet Take 1 tablet (600 mg total) by mouth every 6 (six) hours as needed. 07/18/20   Joy, Shawn C, PA-C  lidocaine (LIDODERM) 5 % Place 1 patch onto the skin daily. Remove & Discard patch within 12 hours or as directed by MD 07/18/20   Lorayne Bender, PA-C  Magnesium 400 MG TABS Take 1 tablet by mouth daily. 05/08/21   Lindell Spar, MD  molnupiravir EUA (LAGEVRIO) 200 mg CAPS capsule Take 4 capsules (800 mg total) by mouth 2 (two) times daily for 5 days. 05/21/21 05/26/21  Volney American, PA-C  pantoprazole (PROTONIX) 40 MG tablet Take 1 tablet (40 mg total) by mouth daily. 01/30/21   Lindell Spar, MD  potassium chloride SA (KLOR-CON M) 20 MEQ tablet Take 1 tablet (20 mEq total) by mouth daily. 05/08/21   Lindell Spar, MD  predniSONE (DELTASONE) 20 MG tablet Take 2 tablets (40 mg total) by mouth daily with breakfast. 05/21/21   Volney American, PA-C  promethazine-dextromethorphan (PROMETHAZINE-DM) 6.25-15 MG/5ML syrup Take 5 mLs by mouth 4 (four) times daily as needed. 05/21/21   Volney American, PA-C  Vitamin D, Ergocalciferol, (DRISDOL) 1.25 MG (50000 UNIT) CAPS capsule Take 1 capsule (50,000 Units total) by mouth every 7 (seven) days. 03/15/21   Lindell Spar, MD    Family History Family History  Problem Relation Age of Onset   ALS Mother    Cardiomyopathy Father    Colon cancer Neg Hx     Social History Social History   Tobacco Use   Smoking status: Former    Packs/day: 0.10    Years: 20.00    Pack years: 2.00    Types: Cigarettes    Quit date: 02/23/2021    Years since quitting: 0.2   Smokeless tobacco: Never  Vaping Use   Vaping Use: Never used  Substance Use Topics    Alcohol use: Yes    Alcohol/week: 0.0 standard drinks    Comment: seldom   Drug use: Yes    Frequency: 2.0 times per week    Types: Marijuana    Comment: marijuana maybe once a week    Allergies   Sulfa antibiotics   Review of Systems Review of Systems Per HPI  Physical Exam Triage Vital Signs ED Triage Vitals  Enc Vitals Group     BP 05/21/21 1238 135/77     Pulse Rate 05/21/21 1238 78     Resp 05/21/21 1226 16     Temp 05/21/21 1226 98 F (36.7 C)     Temp Source 05/21/21  1226 Oral     SpO2 05/21/21 1226 95 %     Weight --      Height --      Head Circumference --      Peak Flow --      Pain Score 05/21/21 1222 10     Pain Loc --      Pain Edu? --      Excl. in [place]? --    No data found.  Updated Vital Signs BP 135/77 (BP Location: Right Arm)    Pulse 78    Temp 98 F (36.7 C) (Oral)    Resp 16    LMP 05/21/1997    SpO2 95%   Visual Acuity Right Eye Distance:   Left Eye Distance:   Bilateral Distance:    Right Eye Near:   Left Eye Near:    Bilateral Near:     Physical Exam Vitals and nursing note reviewed.  Constitutional:      Appearance: Normal appearance.  HENT:     Head: Atraumatic.     Right Ear: Tympanic membrane and external ear normal.     Left Ear: Tympanic membrane and external ear normal.     Nose: Rhinorrhea present.     Mouth/Throat:     Mouth: Mucous membranes are moist.     Pharynx: Posterior oropharyngeal erythema present.  Eyes:     Extraocular Movements: Extraocular movements intact.     Conjunctiva/sclera: Conjunctivae normal.  Cardiovascular:     Rate and Rhythm: Normal rate and regular rhythm.     Heart sounds: Normal heart sounds.  Pulmonary:     Effort: Pulmonary effort is normal.     Breath sounds: Wheezing present. No rales.  Musculoskeletal:        General: Normal range of motion.     Cervical back: Normal range of motion and neck supple.  Skin:    General: Skin is warm and dry.  Neurological:     Mental  Status: She is alert and oriented to person, place, and time.  Psychiatric:        Mood and Affect: Mood normal.        Thought Content: Thought content normal.    UC Treatments / Results  Labs (all labs ordered are listed, but only abnormal results are displayed) Labs Reviewed  COVID-19, FLU A+B NAA    EKG   Radiology No results found.  Procedures Procedures (including critical care time)  Medications Ordered in UC Medications - No data to display  Initial Impression / Assessment and Plan / UC Course  I have reviewed the triage vital signs and the nursing notes.  Pertinent labs & imaging results that were available during my care of the patient were reviewed by me and considered in my medical decision making (see chart for details).     Vital signs benign and reassuring today, suspect COVID-like illness.  COVID and flu testing pending, will treat proactively with antiviral therapy for COVID, prednisone, albuterol inhaler, Phenergan DM for symptomatic benefit due to wheezing and chest tightness.  Discussed supportive home care and return precautions.  Final Clinical Impressions(s) / UC Diagnoses   Final diagnoses:  Exposure to the flu  Viral URI with cough  Wheezing   Discharge Instructions   None    ED Prescriptions     Medication Sig Dispense Auth. Provider   molnupiravir EUA (LAGEVRIO) 200 mg CAPS capsule  (Status: Discontinued) Take 4 capsules (800 mg total) by mouth 2 (  two) times daily for 5 days. 40 capsule Volney American, [place]   promethazine-dextromethorphan (PROMETHAZINE-DM) 6.25-15 MG/5ML syrup  (Status: Discontinued) Take 5 mLs by mouth 4 (four) times daily as needed. 100 mL Volney American, PA-C   albuterol (VENTOLIN HFA) 108 (90 Base) MCG/ACT inhaler  (Status: Discontinued) Inhale 2 puffs into the lungs every 6 (six) hours as needed for wheezing or shortness of breath. 18 g Volney American, PA-C   predniSONE (DELTASONE) 20 MG tablet   (Status: Discontinued) Take 2 tablets (40 mg total) by mouth daily with breakfast. 10 tablet Volney American, PA-C   albuterol (VENTOLIN HFA) 108 (90 Base) MCG/ACT inhaler Inhale 2 puffs into the lungs every 6 (six) hours as needed for wheezing or shortness of breath. 18 g Volney American, PA-C   molnupiravir EUA (LAGEVRIO) 200 mg CAPS capsule Take 4 capsules (800 mg total) by mouth 2 (two) times daily for 5 days. 40 capsule Volney American, [place]   predniSONE (DELTASONE) 20 MG tablet Take 2 tablets (40 mg total) by mouth daily with breakfast. 10 tablet Volney American, PA-C   promethazine-dextromethorphan (PROMETHAZINE-DM) 6.25-15 MG/5ML syrup Take 5 mLs by mouth 4 (four) times daily as needed. 100 mL Volney American, [place]      PDMP not reviewed this encounter.   Volney American, [place] 05/21/21 1316

## 2021-05-21 NOTE — ED Triage Notes (Signed)
Patient states she had surgery on Friday.   Patient states that on Saturday night she felt some congestion and it has just progressed.  She states her chest is hurting when she coughs.  Patient states she took OTC cough medicine last dose this morning at 9am.  Denies fever

## 2021-05-22 ENCOUNTER — Ambulatory Visit: Payer: BC Managed Care – PPO | Admitting: Internal Medicine

## 2021-05-23 ENCOUNTER — Encounter (HOSPITAL_COMMUNITY): Payer: Self-pay | Admitting: Ophthalmology

## 2021-05-24 LAB — COVID-19, FLU A+B NAA
Influenza A, NAA: NOT DETECTED
Influenza B, NAA: NOT DETECTED
SARS-CoV-2, NAA: NOT DETECTED

## 2021-06-01 DIAGNOSIS — H2511 Age-related nuclear cataract, right eye: Secondary | ICD-10-CM | POA: Diagnosis not present

## 2021-06-02 ENCOUNTER — Other Ambulatory Visit: Payer: Self-pay

## 2021-06-02 ENCOUNTER — Encounter (HOSPITAL_COMMUNITY)
Admission: RE | Admit: 2021-06-02 | Discharge: 2021-06-02 | Disposition: A | Discharge status: Home / Self Care | Payer: BC Managed Care – PPO | Source: Ambulatory Visit | Attending: Ophthalmology | Admitting: Ophthalmology

## 2021-06-02 ENCOUNTER — Encounter (HOSPITAL_COMMUNITY): Payer: Self-pay

## 2021-06-05 NOTE — H&P (Signed)
Surgical History & Physical  Patient Name: [name] [name] DOB: 23-Aug-1965  Surgery: Cataract extraction with intraocular lens implant phacoemulsification; Right Eye  Surgeon: Fabio Pierce MD Surgery Date:  06-09-21 Pre-Op Date:  06-01-21 HPI: A 59 Yr. old female patient presents for 2 week post op OS, CE IOL with Toric. Since the surgery, the patient sees a lot better OS. The patient is still using the combination post op drop TID OD. The patient denies any pain, but initially she felt like something is in her eye. The patient also presents for pre op OD. The patient's vision in that eye is still negatively affecting her quality of life, and it still makes it difficult to perform daily activities such as reading fine print. HPI was performed by Fabio Pierce .  Medical History: Dry Eyes Cataracts Macula Degeneration Glaucoma ADD Anxiety Disorder Acid Reflux High Blood Pressure  Review of Systems Negative Allergic/Immunologic Negative Cardiovascular Negative Constitutional Negative Ear, Nose, Mouth & Throat Negative Endocrine Negative Eyes Negative Gastrointestinal Negative Genitourinary Negative Hemotologic/Lymphatic Negative Integumentary Negative Musculoskeletal Negative Neurological Negative Psychiatry Negative Respiratory  Social   Former smoker   Medication Systane, Prednisolone-Moxifloxacin-Bromfenac, Pantoprazole, Adderall, Hydrochlorothiazide, Xanax,   Sx/Procedures Phaco c IOL OS,   Drug Allergies  Sulfa (sulfonamide antibiotics),   History & Physical: Heent:  Cataract, Right Eye NECK: supple without bruits LUNGS: lungs clear to auscultation CV: regular rate and rhythm Abdomen: soft and non-tender  Impression & Plan: Assessment: 1.  CATARACT EXTRACTION STATUS; Left Eye (Z98.42) 2.  NUCLEAR SCLEROSIS AGE RELATED; , Right Eye (H25.11) 3.  ASTIGMATISM, REGULAR; Both Eyes (H52.223)  Plan: 1.  2 weeks after cataract surgery. Doing well with improved  vision and normal eye pressure. Call with any problems or concerns. Continue Pred-Moxi-Brom 2x/day for 2 more weeks.  2.  Dilates well - shugarcaine by protocol. Toric Lens. Cataract accounts for the patient's decreased vision. This visual impairment is not correctable with a tolerable change in glasses or contact lenses. Cataract surgery with an implantation of a new lens should significantly improve the visual and functional status of the patient. Discussed all risks, benefits, alternatives, and potential complications. Discussed the procedures and recovery. Patient desires to have surgery. A-scan ordered and performed today for intra-ocular lens calculations. The surgery will be performed in order to improve vision for driving, reading, and for eye examinations. Recommend phacoemulsification with intra-ocular lens. Recommend Dextenza for post-operative pain and inflammation. Right Eye. Surgery required to correct imbalance of vision.  3.  Recommend toric IOL.

## 2021-06-09 ENCOUNTER — Encounter (HOSPITAL_COMMUNITY)
Admission: RE | Disposition: A | Discharge status: Home / Self Care | Payer: Self-pay | Source: Home / Self Care | Attending: Ophthalmology

## 2021-06-09 ENCOUNTER — Ambulatory Visit (HOSPITAL_COMMUNITY)
Admission: RE | Admit: 2021-06-09 | Discharge: 2021-06-09 | Disposition: A | Discharge status: Home / Self Care | Payer: BC Managed Care – PPO | Attending: Ophthalmology | Admitting: Ophthalmology

## 2021-06-09 ENCOUNTER — Encounter (HOSPITAL_COMMUNITY): Payer: Self-pay | Admitting: Ophthalmology

## 2021-06-09 ENCOUNTER — Ambulatory Visit (HOSPITAL_COMMUNITY): Payer: BC Managed Care – PPO | Admitting: Anesthesiology

## 2021-06-09 DIAGNOSIS — H2511 Age-related nuclear cataract, right eye: Secondary | ICD-10-CM | POA: Diagnosis not present

## 2021-06-09 DIAGNOSIS — F32A Depression, unspecified: Secondary | ICD-10-CM | POA: Diagnosis not present

## 2021-06-09 DIAGNOSIS — G709 Myoneural disorder, unspecified: Secondary | ICD-10-CM | POA: Diagnosis not present

## 2021-06-09 DIAGNOSIS — K219 Gastro-esophageal reflux disease without esophagitis: Secondary | ICD-10-CM | POA: Insufficient documentation

## 2021-06-09 DIAGNOSIS — Z8673 Personal history of transient ischemic attack (TIA), and cerebral infarction without residual deficits: Secondary | ICD-10-CM | POA: Insufficient documentation

## 2021-06-09 DIAGNOSIS — I10 Essential (primary) hypertension: Secondary | ICD-10-CM | POA: Diagnosis not present

## 2021-06-09 DIAGNOSIS — F419 Anxiety disorder, unspecified: Secondary | ICD-10-CM | POA: Diagnosis not present

## 2021-06-09 DIAGNOSIS — F418 Other specified anxiety disorders: Secondary | ICD-10-CM | POA: Diagnosis not present

## 2021-06-09 DIAGNOSIS — Z87891 Personal history of nicotine dependence: Secondary | ICD-10-CM | POA: Insufficient documentation

## 2021-06-09 DIAGNOSIS — H52201 Unspecified astigmatism, right eye: Secondary | ICD-10-CM | POA: Insufficient documentation

## 2021-06-09 DIAGNOSIS — D649 Anemia, unspecified: Secondary | ICD-10-CM | POA: Diagnosis not present

## 2021-06-09 HISTORY — PX: CATARACT EXTRACTION W/PHACO: SHX586

## 2021-06-09 SURGERY — PHACOEMULSIFICATION, CATARACT, WITH IOL INSERTION
Anesthesia: Monitor Anesthesia Care | Site: Eye | Laterality: Right

## 2021-06-09 MED ORDER — SODIUM HYALURONATE 10 MG/ML IO SOLUTION
PREFILLED_SYRINGE | INTRAOCULAR | Status: DC | PRN
  Administered 2021-06-09: 0.85 mL via INTRAOCULAR

## 2021-06-09 MED ORDER — LIDOCAINE HCL (PF) 1 % IJ SOLN
INTRAOCULAR | Status: DC | PRN
  Administered 2021-06-09: 1 mL via OPHTHALMIC

## 2021-06-09 MED ORDER — LACTATED RINGERS IV SOLN: INTRAVENOUS | Status: DC

## 2021-06-09 MED ORDER — MIDAZOLAM HCL 2 MG/2ML IJ SOLN
INTRAMUSCULAR | Status: DC | PRN
  Administered 2021-06-09: 2 mg via INTRAVENOUS

## 2021-06-09 MED ORDER — EPINEPHRINE PF 1 MG/ML IJ SOLN
INTRAOCULAR | Status: DC | PRN
  Administered 2021-06-09: 500 mL

## 2021-06-09 MED ORDER — POVIDONE-IODINE 5 % OP SOLN
OPHTHALMIC | Status: DC | PRN
Start: 2021-06-09 — End: 2021-06-09
  Administered 2021-06-09: 1 via OPHTHALMIC

## 2021-06-09 MED ORDER — STERILE WATER FOR IRRIGATION IR SOLN
Status: DC | PRN
  Administered 2021-06-09: 250 mL

## 2021-06-09 MED ORDER — PHENYLEPHRINE HCL 2.5 % OP SOLN
1.0000 [drp] | OPHTHALMIC | Status: AC | PRN
  Administered 2021-06-09 (×3): 1 [drp] via OPHTHALMIC

## 2021-06-09 MED ORDER — MIDAZOLAM HCL 2 MG/2ML IJ SOLN
INTRAMUSCULAR | Status: AC
  Filled 2021-06-09: qty 2

## 2021-06-09 MED ORDER — BSS IO SOLN
INTRAOCULAR | Status: DC | PRN
  Administered 2021-06-09: 15 mL via INTRAOCULAR

## 2021-06-09 MED ORDER — TROPICAMIDE 1 % OP SOLN
1.0000 [drp] | OPHTHALMIC | Status: AC | PRN
  Administered 2021-06-09 (×3): 1 [drp] via OPHTHALMIC
  Filled 2021-06-09: qty 2

## 2021-06-09 MED ORDER — SODIUM CHLORIDE 0.9% FLUSH
INTRAVENOUS | Status: DC | PRN
  Administered 2021-06-09: 6 mL via INTRAVENOUS

## 2021-06-09 MED ORDER — LIDOCAINE HCL 3.5 % OP GEL
1.0000 "application " | Freq: Once | OPHTHALMIC | Status: AC
  Administered 2021-06-09: 1 via OPHTHALMIC

## 2021-06-09 MED ORDER — TETRACAINE HCL 0.5 % OP SOLN
1.0000 [drp] | OPHTHALMIC | Status: AC | PRN
  Administered 2021-06-09 (×3): 1 [drp] via OPHTHALMIC

## 2021-06-09 SURGICAL SUPPLY — 15 items
CATARACT SUITE SIGHTPATH (MISCELLANEOUS) ×3 IMPLANT
CLOTH BEACON ORANGE TIMEOUT ST (SAFETY) ×2 IMPLANT
EYE SHIELD UNIVERSAL CLEAR (GAUZE/BANDAGES/DRESSINGS) ×2 IMPLANT
FEE CATARACT SUITE SIGHTPATH (MISCELLANEOUS) IMPLANT
GLOVE SURG UNDER POLY LF SZ7 (GLOVE) ×4 IMPLANT
LENS IOL EYHANCE TORIC II 16.0 (Ophthalmic Related) ×3 IMPLANT
LENS IOL EYHANCE TRC 150 16.0 (Ophthalmic Related) IMPLANT
LENS IOL EYHNC TORIC 150 16.0 (Ophthalmic Related) ×1 IMPLANT
NDL HYPO 18GX1.5 BLUNT FILL (NEEDLE) IMPLANT
NEEDLE HYPO 18GX1.5 BLUNT FILL (NEEDLE) ×3 IMPLANT
PAD ARMBOARD 7.5X6 YLW CONV (MISCELLANEOUS) ×2 IMPLANT
SYR TB 1ML LL NO SAFETY (SYRINGE) ×2 IMPLANT
TAPE SURG TRANSPORE 1 IN (GAUZE/BANDAGES/DRESSINGS) IMPLANT
TAPE SURGICAL TRANSPORE 1 IN (GAUZE/BANDAGES/DRESSINGS) ×3
WATER STERILE IRR 250ML POUR (IV SOLUTION) ×2 IMPLANT

## 2021-06-09 NOTE — Anesthesia Procedure Notes (Signed)
Date/Time: 06/09/2021 8:27 AM Performed by: Franco Nones, CRNA Pre-anesthesia Checklist: Patient identified, Emergency Drugs available, Suction available, Timeout performed and Patient being monitored Patient Re-evaluated:Patient Re-evaluated prior to induction Oxygen Delivery Method: Nasal Cannula

## 2021-06-09 NOTE — Transfer of Care (Signed)
Immediate Anesthesia Transfer of Care Note  Patient: [name] [name]  Procedure(s) Performed: CATARACT EXTRACTION PHACO AND INTRAOCULAR LENS PLACEMENT (IOC) (Right: Eye)  Patient Location: Short Stay  Anesthesia Type:MAC  Level of Consciousness: awake and patient cooperative  Airway & Oxygen Therapy: Patient Spontanous Breathing  Post-op Assessment: Report given to RN and Post -op Vital signs reviewed and stable  Post vital signs: Reviewed and stable  Last Vitals:  Vitals Value Taken Time  BP    Temp    Pulse    Resp    SpO2      Last Pain:  Vitals:   06/09/21 0708  TempSrc: Oral  PainSc: 0-No pain      Patients Stated Pain Goal: 5 (24/40/10 2725)  Complications: No notable events documented.

## 2021-06-09 NOTE — Op Note (Signed)
Date of procedure: 06/09/21  Pre-operative diagnosis: Visually significant age-related nuclear cataract, Right Eye; Visually Significant Astigmatism, Right Eye (H25.11)  Post-operative diagnosis: Visually significant age-related cataract, Right Eye; Visually Significant Astigmatism, Right Eye  Procedure: Removal of cataract via phacoemulsification and insertion of intra-ocular lens Wynetta Emery and Johnson [id number] +16.0D into the capsular bag of the Right Eye  Attending surgeon: Gerda Diss. Lisett Dirusso, MD, MA  Anesthesia: MAC, Topical Akten  Complications: None  Estimated Blood Loss: <71m (minimal)  Specimens: None  Implants: As above  Indications:  Visually significant age-related cataract, Right Eye; Visually Significant Astigmatism, Right Eye  Procedure:  The patient was seen and identified in the pre-operative area. The operative eye was identified and dilated.  The operative eye was marked.  Pre-operative toric markers were used to mark the eye at 0 and 180 degrees. Topical anesthesia was administered to the operative eye.     The patient was then to the operative suite and placed in the supine position.  A timeout was performed confirming the patient, procedure to be performed, and all other relevant information.   The patient's face was prepped and draped in the usual fashion for intra-ocular surgery.  A lid speculum was placed into the operative eye and the surgical microscope moved into place and focused.  A superotemporal paracentesis was created using a 20 gauge paracentesis blade.  Shugarcaine was injected into the anterior chamber.  Viscoelastic was injected into the anterior chamber.  A temporal clear-corneal main wound incision was created using a 2.445mmicrokeratome.  A continuous curvilinear capsulorrhexis was initiated using an irrigating cystitome and completed using capsulorrhexis forceps.  Hydrodissection and hydrodeliniation were performed.  Viscoelastic was injected into the  anterior chamber.  A phacoemulsification handpiece and a chopper as a second instrument were used to remove the nucleus and epinucleus. The irrigation/aspiration handpiece was used to remove any remaining cortical material.   The capsular bag was reinflated with viscoelastic, checked, and found to be intact.  The intraocular lens was inserted into the capsular bag and dialed into place using a Kuglen hook to ?? degrees.  The irrigation/aspiration handpiece was used to remove any remaining viscoelastic.  The clear corneal wound and paracentesis wounds were then hydrated and checked with Weck-Cels to be watertight.  The lid-speculum and drape was removed, and the patient's face was cleaned with a wet and dry 4x4. A clear shield was taped over the eye. The patient was taken to the post-operative care unit in good condition, having tolerated the procedure well.  Post-Op Instructions: [facility] and will receive all other orders and instructions.

## 2021-06-09 NOTE — Anesthesia Preprocedure Evaluation (Signed)
Anesthesia Evaluation  Patient identified by MRN, date of birth, ID band Patient awake    Reviewed: Allergy & Precautions, H&P , NPO status , Patient's Chart, lab work & pertinent test results, reviewed documented beta blocker date and time   Airway Mallampati: II  TM Distance: >3 FB Neck ROM: full    Dental no notable dental hx.    Pulmonary neg pulmonary ROS, former smoker,    Pulmonary exam normal breath sounds clear to auscultation       Cardiovascular Exercise Tolerance: Good hypertension, negative cardio ROS   Rhythm:regular Rate:Normal     Neuro/Psych PSYCHIATRIC DISORDERS Anxiety Depression  Neuromuscular disease CVA, No Residual Symptoms    GI/Hepatic Neg liver ROS, GERD  Medicated,  Endo/Other  negative endocrine ROS  Renal/GU negative Renal ROS  negative genitourinary   Musculoskeletal negative musculoskeletal ROS (+)   Abdominal   Peds  Hematology negative hematology ROS (+) anemia ,   Anesthesia Other Findings   Reproductive/Obstetrics negative OB ROS                             Anesthesia Physical  Anesthesia Plan  ASA: 3  Anesthesia Plan: MAC   Post-op Pain Management:    Induction:   PONV Risk Score and Plan:   Airway Management Planned:   Additional Equipment:   Intra-op Plan:   Post-operative Plan:   Informed Consent: I have reviewed the patients History and Physical, chart, labs and discussed the procedure including the risks, benefits and alternatives for the proposed anesthesia with the patient or authorized representative who has indicated his/her understanding and acceptance.       Plan Discussed with: CRNA  Anesthesia Plan Comments:         Anesthesia Quick Evaluation

## 2021-06-09 NOTE — Discharge Instructions (Addendum)
Please discharge patient when stable, will follow up today with Dr. Wrzosek at the [facility] [place] office immediately following discharge.  Leave shield in place until visit.  All paperwork with discharge instructions will be given at the office.  [facility] Address:  [address]  [place], [place] [address]  

## 2021-06-09 NOTE — Anesthesia Postprocedure Evaluation (Signed)
Anesthesia Post Note  Patient: [name] [name]  Procedure(s) Performed: CATARACT EXTRACTION PHACO AND INTRAOCULAR LENS PLACEMENT (IOC) (Right: Eye)  Patient location during evaluation: PACU Anesthesia Type: MAC Level of consciousness: awake and alert Pain management: pain level controlled Vital Signs Assessment: post-procedure vital signs reviewed and stable Respiratory status: spontaneous breathing, nonlabored ventilation, respiratory function stable and patient connected to nasal cannula oxygen Cardiovascular status: stable and blood pressure returned to baseline Postop Assessment: no apparent nausea or vomiting Anesthetic complications: no   No notable events documented.   Last Vitals:  Vitals:   06/09/21 0708 06/09/21 0854  BP: 103/65 125/79  Pulse:  71  Resp: 13 15  Temp: 36.8 C 36.8 C  SpO2: 100% 97%    Last Pain:  Vitals:   06/09/21 0854  TempSrc: Oral  PainSc: 0-No pain                 Trixie Rude

## 2021-06-09 NOTE — Interval H&P Note (Signed)
History and Physical Interval Note:  06/09/2021 8:23 AM  [name] [name]  has presented today for surgery, with the diagnosis of nuclear sclerosis age related; right eye.  The various methods of treatment have been discussed with the patient and family. After consideration of risks, benefits and other options for treatment, the patient has consented to  Procedure(s) with comments: CATARACT EXTRACTION PHACO AND INTRAOCULAR LENS PLACEMENT (IOC) (Right) - right as a surgical intervention.  The patient's history has been reviewed, patient examined, no change in status, stable for surgery.  I have reviewed the patient's chart and labs.  Questions were answered to the patient's satisfaction.     Fabio Pierce

## 2021-06-10 ENCOUNTER — Other Ambulatory Visit: Payer: Self-pay | Admitting: Internal Medicine

## 2021-06-10 DIAGNOSIS — I10 Essential (primary) hypertension: Secondary | ICD-10-CM

## 2021-06-12 ENCOUNTER — Other Ambulatory Visit: Payer: Self-pay | Admitting: Internal Medicine

## 2021-06-12 DIAGNOSIS — I10 Essential (primary) hypertension: Secondary | ICD-10-CM

## 2021-06-13 ENCOUNTER — Encounter (HOSPITAL_COMMUNITY): Payer: Self-pay | Admitting: Ophthalmology

## 2021-06-15 ENCOUNTER — Ambulatory Visit: Payer: BC Managed Care – PPO | Admitting: Internal Medicine

## 2021-06-15 ENCOUNTER — Other Ambulatory Visit: Payer: Self-pay

## 2021-06-15 ENCOUNTER — Encounter: Payer: Self-pay | Admitting: Internal Medicine

## 2021-06-15 VITALS — BP 124/78 | HR 87 | Wt 173.0 lb

## 2021-06-15 DIAGNOSIS — L249 Irritant contact dermatitis, unspecified cause: Secondary | ICD-10-CM

## 2021-06-15 DIAGNOSIS — K219 Gastro-esophageal reflux disease without esophagitis: Secondary | ICD-10-CM

## 2021-06-15 DIAGNOSIS — I10 Essential (primary) hypertension: Secondary | ICD-10-CM | POA: Diagnosis not present

## 2021-06-15 DIAGNOSIS — E669 Obesity, unspecified: Secondary | ICD-10-CM | POA: Diagnosis not present

## 2021-06-15 DIAGNOSIS — J3089 Other allergic rhinitis: Secondary | ICD-10-CM

## 2021-06-15 DIAGNOSIS — E66811 Obesity, class 1: Secondary | ICD-10-CM

## 2021-06-15 MED ORDER — PANTOPRAZOLE SODIUM 40 MG PO TBEC: 40.0000 mg | DELAYED_RELEASE_TABLET | Freq: Every day | ORAL | 3 refills | Status: DC

## 2021-06-15 MED ORDER — TRIAMCINOLONE ACETONIDE 0.1 % EX CREA: 1.0000 "application " | TOPICAL_CREAM | Freq: Two times a day (BID) | CUTANEOUS | 0 refills | Status: DC

## 2021-06-15 MED ORDER — WEGOVY 0.25 MG/0.5ML ~~LOC~~ SOAJ: 0.2500 mg | SUBCUTANEOUS | 0 refills | Status: DC

## 2021-06-15 MED ORDER — CETIRIZINE HCL 10 MG PO TABS: 10.0000 mg | ORAL_TABLET | Freq: Every day | ORAL | 3 refills | Status: DC

## 2021-06-15 NOTE — Progress Notes (Signed)
° °Established Patient Office Visit ° °Subjective:  °Patient ID: [name] [name], female    DOB: 01/26/1966  Age: 55 y.o. MRN: [id number] ° °CC:  °Chief Complaint  °Patient presents with  ° Follow-up  °  3 month f/u HTN breaking out on neck has been using benadryl creme   ° ° °HPI °[name] [name] is a 55 y.o. female with past medical history of HTN,  GERD/gastritis, ADD with anxiety and obesity who presents for f/u of her chronic medical conditions. ° °HTN: BP is well-controlled. Takes medications regularly. Patient denies headache, dizziness, chest pain, dyspnea or palpitations. ° °She follows up with Psychiatry for ADD and anxiety. Denies anhedonia, SI or HI. °  °She has been doing diet modification and exercises regularly.  She has not been able to lose any weight from last 4 months. She is interested in medical weight loss. ° °She c/o neck rash for last few weeks, which she attributes to use of new perfume, but she has stopped using. She has tried using Benadryl cream with some relief with itching. ° °Past Medical History:  °Diagnosis Date  ° Allergy   ° Phreesia 09/19/2020  ° Anxiety   ° Bradycardia 03/02/2015  ° Depression   ° GERD (gastroesophageal reflux disease)   ° History of hysterectomy   ° History of oophorectomy   ° Hx of cholecystectomy   ° Hypertension   ° IDA (iron deficiency anemia)   ° Near syncope 03/02/2015  ° Sciatica neuralgia, right   ° Stroke (HCC)   ° "light stroke 08/2015. No deficits  ° Tobacco abuse 03/03/2015  ° Vertigo   ° ° °Past Surgical History:  °Procedure Laterality Date  ° ABDOMINAL HYSTERECTOMY    ° BIOPSY  12/04/2016  ° Procedure: BIOPSY;  Surgeon: Fields, Sandi L, MD;  Location: [facility];  Service: Endoscopy;;  gastric and duodenal biopsy  ° BIOPSY  07/30/2017  ° Procedure: BIOPSY;  Surgeon: Fields, Sandi L, MD;  Location: [facility];  Service: Endoscopy;;  random colon bx's  ° CATARACT EXTRACTION W/PHACO Left 05/19/2021  ° Procedure: CATARACT EXTRACTION PHACO AND INTRAOCULAR  LENS PLACEMENT (IOC);  Surgeon: Wrzosek, James, MD;  Location: [facility];  Service: Ophthalmology;  Laterality: Left;  CDE: 3.35  ° CATARACT EXTRACTION W/PHACO Right 06/09/2021  ° Procedure: CATARACT EXTRACTION PHACO AND INTRAOCULAR LENS PLACEMENT (IOC);  Surgeon: Wrzosek, James, MD;  Location: [facility];  Service: Ophthalmology;  Laterality: Right;  CDE: 5.99  ° CESAREAN SECTION    ° x2  ° CESAREAN SECTION N/A   ° Phreesia 09/19/2020  ° CHOLECYSTECTOMY    ° COLONOSCOPY WITH ESOPHAGOGASTRODUODENOSCOPY (EGD)  2007  ° Dr. Brodie: normal  ° COLONOSCOPY WITH PROPOFOL N/A 07/30/2017  ° Dr. Fields: Redundant colon, external/internal hemorrhoids. Colon otherwise normal.  Random colon biopsies negative.  ° ESOPHAGOGASTRODUODENOSCOPY (EGD) WITH PROPOFOL N/A 12/04/2016  ° Dr. Fields: Gastritis but no H.pylori, small bowel bx negative. gastric polyps benign fundic gland  ° HEMORRHOID SURGERY    ° ° °Family History  °Problem Relation Age of Onset  ° ALS Mother   ° Cardiomyopathy Father   ° Colon cancer Neg Hx   ° ° °Social History  ° °Socioeconomic History  ° Marital status: Married  °  Spouse name: Not on file  ° Number of children: Not on file  ° Years of education: Not on file  ° Highest education level: Not on file  °Occupational History  ° Not on file  °Tobacco Use  °   Smoking status: Former  °  Packs/day: 0.10  °  Years: 20.00  °  Pack years: 2.00  °  Types: Cigarettes  °  Quit date: 02/23/2021  °  Years since quitting: 0.3  ° Smokeless tobacco: Never  °Vaping Use  ° Vaping Use: Never used  °Substance and Sexual Activity  ° Alcohol use: Yes  °  Alcohol/week: 0.0 standard drinks  °  Comment: seldom  ° Drug use: Yes  °  Frequency: 2.0 times per week  °  Types: Marijuana  °  Comment: marijuana maybe once a week  ° Sexual activity: Yes  °  Birth control/protection: Surgical  °Other Topics Concern  ° Not on file  °Social History Narrative  ° Drinks 5-6 cups of coffee daily.  ° °Social Determinants of Health  ° °Financial Resource Strain:  Not on file  °Food Insecurity: Not on file  °Transportation Needs: Not on file  °Physical Activity: Not on file  °Stress: Not on file  °Social Connections: Not on file  °Intimate Partner Violence: Not on file  ° ° °Outpatient Medications Prior to Visit  °Medication Sig Dispense Refill  ° ADDERALL XR 30 MG 24 hr capsule Take 30 mg by mouth daily as needed. For focusing while at work    ° ALPRAZolam (XANAX) 0.5 MG tablet Take 0.5 mg by mouth 2 (two) times daily as needed for anxiety.    ° amLODipine (NORVASC) 5 MG tablet TAKE 1 TABLET (5 MG TOTAL) BY MOUTH DAILY. 90 tablet 0  ° estradiol (CLIMARA - DOSED IN MG/24 HR) 0.05 mg/24hr patch estradiol 0.05 mg/24 hr weekly transdermal patch ° APPLY 1 PATCH BY TRANSDERMAL ROUTE EVERY WEEK    ° fluticasone (FLONASE) 50 MCG/ACT nasal spray Place 1 spray into both nostrils 2 (two) times daily. 9.9 mL 5  ° hydrochlorothiazide (HYDRODIURIL) 25 MG tablet TAKE 1 TABLET (25 MG TOTAL) BY MOUTH DAILY. 90 tablet 0  ° Magnesium 400 MG TABS Take 1 tablet by mouth daily. 100 tablet 2  ° potassium chloride SA (KLOR-CON M) 20 MEQ tablet Take 1 tablet (20 mEq total) by mouth daily. 90 tablet 1  ° Vitamin D, Ergocalciferol, (DRISDOL) 1.25 MG (50000 UNIT) CAPS capsule Take 1 capsule (50,000 Units total) by mouth every 7 (seven) days. 5 capsule 5  ° cetirizine (ZYRTEC) 10 MG tablet Take 1 tablet (10 mg total) by mouth daily. 90 tablet 1  ° pantoprazole (PROTONIX) 40 MG tablet Take 1 tablet (40 mg total) by mouth daily. 90 tablet 1  ° butalbital-acetaminophen-caffeine (FIORICET) 50-325-40 MG tablet Take 1-2 tablets by mouth every 6 (six) hours as needed for headache. (Patient not taking: Reported on 06/15/2021) 20 tablet 0  ° ibuprofen (ADVIL) 600 MG tablet Take 1 tablet (600 mg total) by mouth every 6 (six) hours as needed. (Patient not taking: Reported on 06/15/2021) 30 tablet 0  ° albuterol (VENTOLIN HFA) 108 (90 Base) MCG/ACT inhaler Inhale 2 puffs into the lungs every 6 (six) hours as needed  for wheezing or shortness of breath. (Patient not taking: Reported on 06/15/2021) 18 g 0  ° lidocaine (LIDODERM) 5 % Place 1 patch onto the skin daily. Remove & Discard patch within 12 hours or as directed by MD (Patient not taking: Reported on 06/15/2021) 30 patch 0  ° predniSONE (DELTASONE) 20 MG tablet Take 2 tablets (40 mg total) by mouth daily with breakfast. (Patient not taking: Reported on 06/15/2021) 10 tablet 0  ° promethazine-dextromethorphan (PROMETHAZINE-DM) 6.25-15 MG/5ML syrup Take 5   syrup Take 5 mLs by mouth 4 (four) times daily as needed. (Patient not taking: Reported on 06/15/2021) 100 mL 0   No facility-administered medications prior to visit.    Allergies  Allergen Reactions   Sulfa Antibiotics Hives and Nausea And Vomiting    ROS Review of Systems  Constitutional:  Negative for chills and fever.  HENT:  Negative for congestion, sinus pressure, sinus pain and sore throat.   Eyes:  Negative for pain and discharge.  Respiratory:  Negative for cough and shortness of breath.   Cardiovascular:  Negative for chest pain and palpitations.  Gastrointestinal:  Negative for abdominal pain, diarrhea, nausea and vomiting.  Endocrine: Negative for polydipsia and polyuria.  Genitourinary:  Negative for dysuria and hematuria.  Musculoskeletal:  Positive for arthralgias. Negative for neck pain and neck stiffness.  Skin:  Positive for rash.  Neurological:  Negative for dizziness and weakness.  Psychiatric/Behavioral:  Negative for agitation and behavioral problems. The patient is nervous/anxious.      Objective:    Physical Exam Vitals reviewed.  Constitutional:      General: She is not in acute distress.    Appearance: She is obese. She is not diaphoretic.  HENT:     Head: Normocephalic and atraumatic.     Nose: Nose normal.     Mouth/Throat:     Mouth: Mucous membranes are moist.  Eyes:     General: No scleral icterus.    Extraocular Movements: Extraocular movements intact.   Cardiovascular:     Rate and Rhythm: Normal rate and regular rhythm.     Pulses: Normal pulses.     Heart sounds: Normal heart sounds. No murmur heard. Pulmonary:     Breath sounds: Normal breath sounds. No wheezing or rales.  Musculoskeletal:     Cervical back: Neck supple. No tenderness.     Right lower leg: No edema.     Left lower leg: No edema.  Skin:    General: Skin is warm.     Findings: Rash (Lichenied skin around neck area) present.  Neurological:     General: No focal deficit present.     Mental Status: She is alert and oriented to person, place, and time.  Psychiatric:        Mood and Affect: Mood normal.        Behavior: Behavior normal.    BP 124/78 (BP Location: Left Arm, Patient Position: Sitting, Cuff Size: Normal)    Pulse 87    Wt 173 lb (78.5 kg)    LMP 05/21/1997    SpO2 99%    BMI 31.64 kg/m  Wt Readings from Last 3 Encounters:  06/15/21 173 lb (78.5 kg)  06/02/21 172 lb 1.3 oz (78.1 kg)  05/19/21 172 lb 1.3 oz (78.1 kg)    Lab Results  Component Value Date   TSH 2.620 02/07/2021   Lab Results  Component Value Date   WBC 5.5 04/18/2021   HGB 13.3 04/18/2021   HCT 39.0 04/18/2021   MCV 92.5 04/18/2021   PLT 235 04/18/2021   Lab Results  Component Value Date   NA 141 04/18/2021   K 3.0 (L) 04/18/2021   CO2 25 04/18/2021   GLUCOSE 94 04/18/2021   BUN 12 04/18/2021   CREATININE 0.90 04/18/2021   BILITOT 0.4 04/18/2021   ALKPHOS 72 04/18/2021   AST 15 04/18/2021   ALT 11 04/18/2021   PROT 5.6 (L) 04/18/2021   ALBUMIN 3.1 (L) 04/18/2021   CALCIUM 7.7 (  L) 04/18/2021   ANIONGAP 6 04/18/2021   EGFR 84 02/07/2021   Lab Results  Component Value Date   CHOL 215 (H) 02/07/2021   Lab Results  Component Value Date   HDL 54 02/07/2021   Lab Results  Component Value Date   LDLCALC 133 (H) 02/07/2021   Lab Results  Component Value Date   TRIG 158 (H) 02/07/2021   Lab Results  Component Value Date   CHOLHDL 4.0 02/07/2021   Lab  Results  Component Value Date   HGBA1C 6.0 (H) 02/07/2021      Assessment & Plan:   Problem List Items Addressed This Visit       Cardiovascular and Mediastinum   Essential hypertension - Primary    BP Readings from Last 1 Encounters:  06/15/21 124/78  Well-controlled with HCTZ to 25 mg daily and amlodipine 5 mg daily now Counseled for compliance with the medications Advised DASH diet and moderate exercise/walking, at least 150 mins/week        Respiratory   Allergic rhinitis    Continue Zyrtec and Flonase      Relevant Medications   cetirizine (ZYRTEC) 10 MG tablet     Digestive   GERD (gastroesophageal reflux disease)    H/o gastritis On Pantoprazole 40 mg QD      Relevant Medications   pantoprazole (PROTONIX) 40 MG tablet     Musculoskeletal and Integument   Irritant contact dermatitis    Neck rash likely contact dermatitis Started Kenalog cream      Relevant Medications   triamcinolone cream (KENALOG) 0.1 %     Other   Obesity (BMI 30.0-34.9)    BMI Readings from Last 3 Encounters:  06/15/21 31.64 kg/m  06/02/21 31.47 kg/m  05/19/21 31.47 kg/m  Has tried to follow low carb diet and regular exercise, but unable to lose weight Good candidate for GLP1 agonist Will start Wegovy, increase dose as tolerated       Relevant Medications   Semaglutide-Weight Management (WEGOVY) 0.25 MG/0.5ML SOAJ    Meds ordered this encounter  Medications   pantoprazole (PROTONIX) 40 MG tablet    Sig: Take 1 tablet (40 mg total) by mouth daily.    Dispense:  90 tablet    Refill:  3   cetirizine (ZYRTEC) 10 MG tablet    Sig: Take 1 tablet (10 mg total) by mouth daily.    Dispense:  90 tablet    Refill:  3   Semaglutide-Weight Management (WEGOVY) 0.25 MG/0.5ML SOAJ    Sig: Inject 0.25 mg into the skin every 7 (seven) days.    Dispense:  2 mL    Refill:  0   triamcinolone cream (KENALOG) 0.1 %    Sig: Apply 1 application topically 2 (two) times daily.     Dispense:  30 g    Refill:  0    Follow-up: Return in about 3 months (around 09/13/2021) for Weight management.    Lindell Spar, MD

## 2021-06-15 NOTE — Assessment & Plan Note (Signed)
BP Readings from Last 1 Encounters:  06/15/21 124/78   Well-controlled with HCTZ to 25 mg daily and amlodipine 5 mg daily now Counseled for compliance with the medications Advised DASH diet and moderate exercise/walking, at least 150 mins/week

## 2021-06-15 NOTE — Assessment & Plan Note (Signed)
Continue Zyrtec and Flonase.

## 2021-06-15 NOTE — Patient Instructions (Signed)
Please start using Kenalog cream for neck rash.  We will try to authorize weight loss medicine for you.  Please continue to take other medications as prescribed.  Please continue to follow low carb diet and perform moderate exercise/walking at least 150 mins/week.

## 2021-06-15 NOTE — Assessment & Plan Note (Addendum)
H/o gastritis On Pantoprazole 40 mg QD

## 2021-06-15 NOTE — Assessment & Plan Note (Signed)
BMI Readings from Last 3 Encounters:  06/15/21 31.64 kg/m  06/02/21 31.47 kg/m  05/19/21 31.47 kg/m   Has tried to follow low carb diet and regular exercise, but unable to lose weight Good candidate for GLP1 agonist Will start Wegovy, increase dose as tolerated

## 2021-06-15 NOTE — Assessment & Plan Note (Signed)
Neck rash likely contact dermatitis Started Kenalog cream

## 2021-06-17 ENCOUNTER — Other Ambulatory Visit: Payer: Self-pay | Admitting: Family Medicine

## 2021-06-18 IMAGING — CT CT ANGIO CHEST
2 of 6 series · 17 of 46 positions shown · IV contrast (Omnipaque or Isovue)
Comparison: 07/18/2020, 10/16/2018, 11/15/2016

CLINICAL DATA: Right chest wall pain radiating to back, COVID-08 June, 2020, abdominal pain

EXAM:
CT ANGIOGRAPHY CHEST
CT ABDOMEN AND PELVIS WITH CONTRAST
TECHNIQUE: Multidetector CT imaging of the chest was performed using the
standard protocol during bolus administration of intravenous
contrast. Multiplanar CT image reconstructions and MIPs were
obtained to evaluate the vascular anatomy. Multidetector CT imaging
of the abdomen and pelvis was performed using the standard protocol
during bolus administration of intravenous contrast.
CONTRAST:  100mL OMNIPAQUE IOHEXOL 350 MG/ML SOLN

[Series 6: pe axialthins · axial · 0.63mm/px · z∈[+1236,+1468]mm · 14 of 255 slices shown]
[im 12/255  lung]
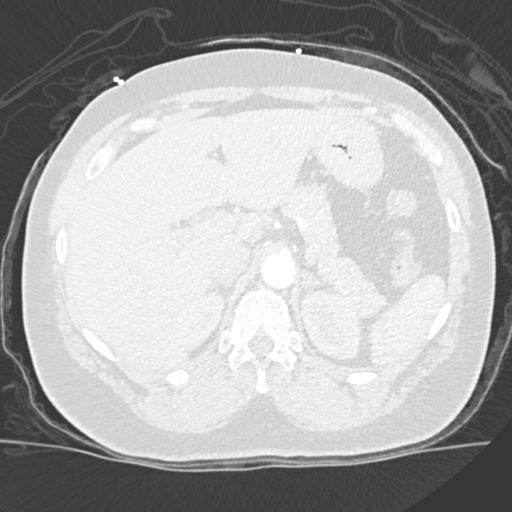
[im 34/255  soft-tissue]
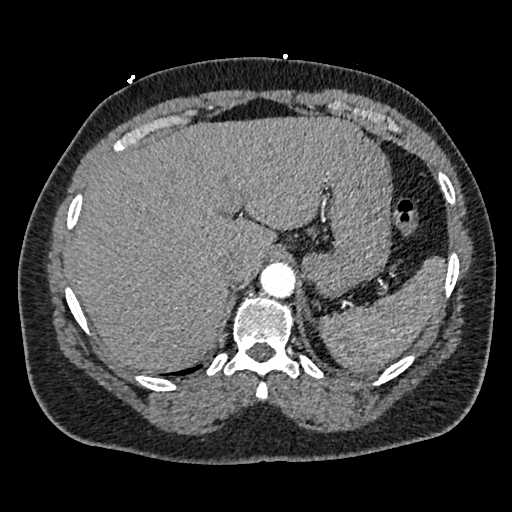
[im 45/255  lung]
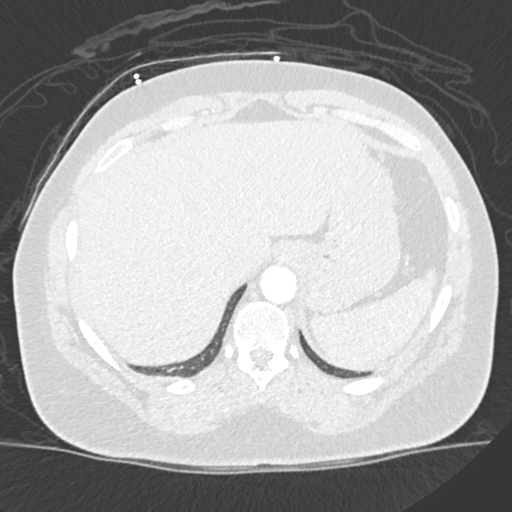
[im 67/255  soft-tissue]
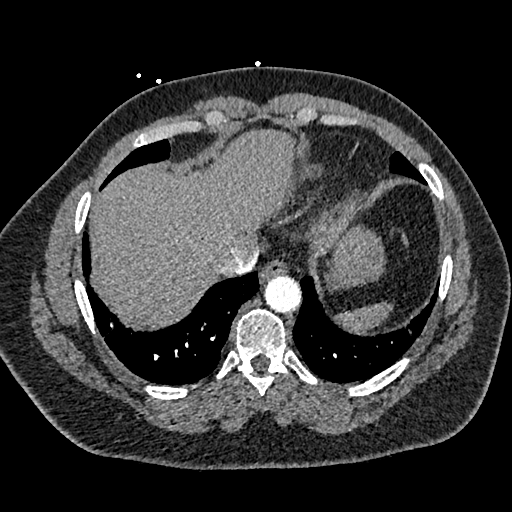
[im 89/255  lung]
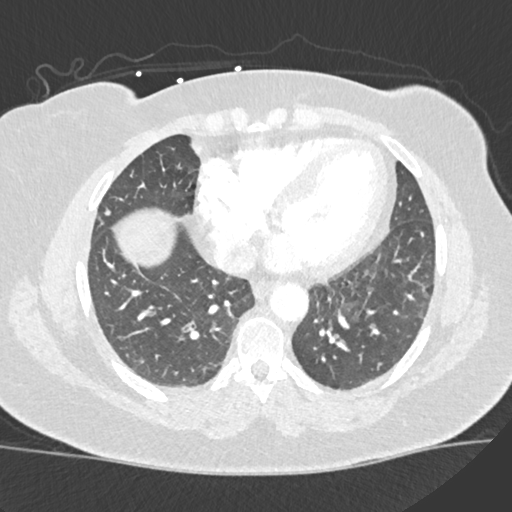
[im 100/255  soft-tissue]
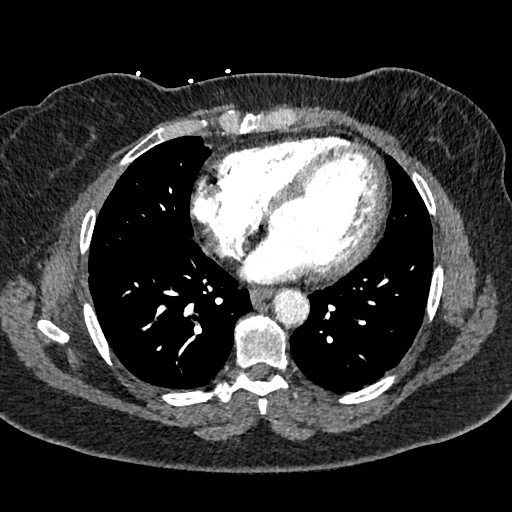
[im 122/255  lung]
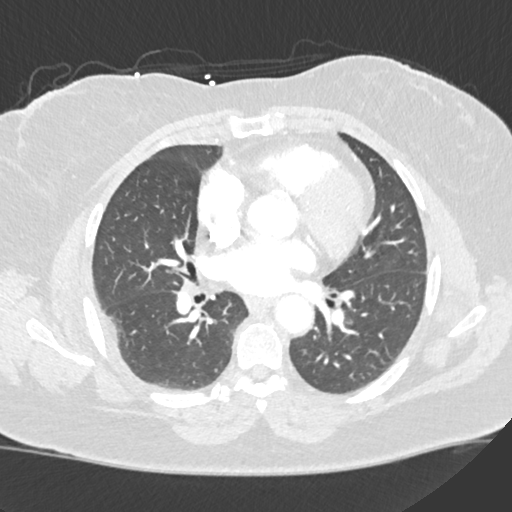
[im 133/255  soft-tissue]
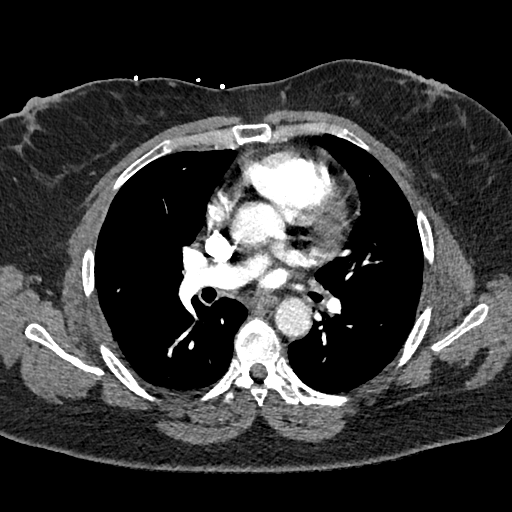
[im 155/255  lung]
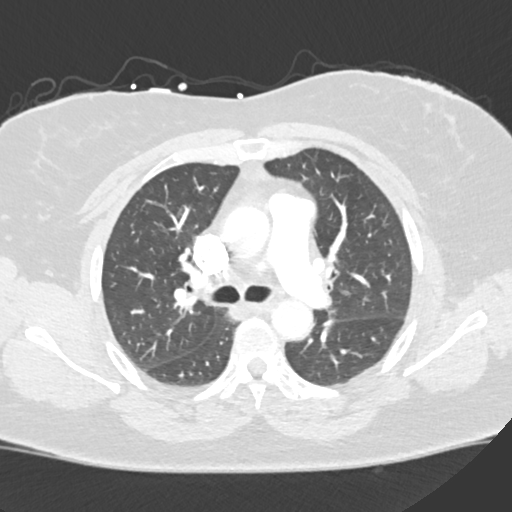
[im 166/255  soft-tissue]
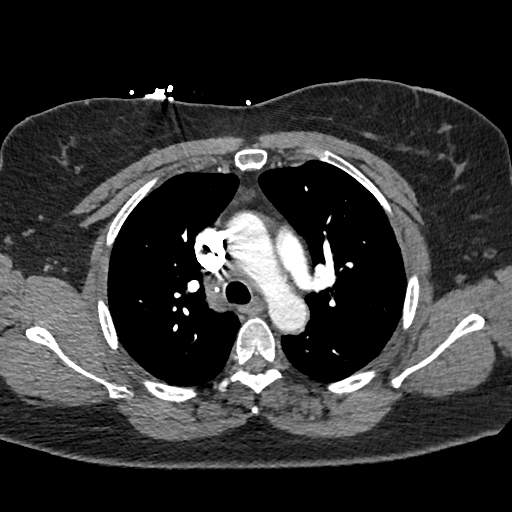
[im 188/255  lung]
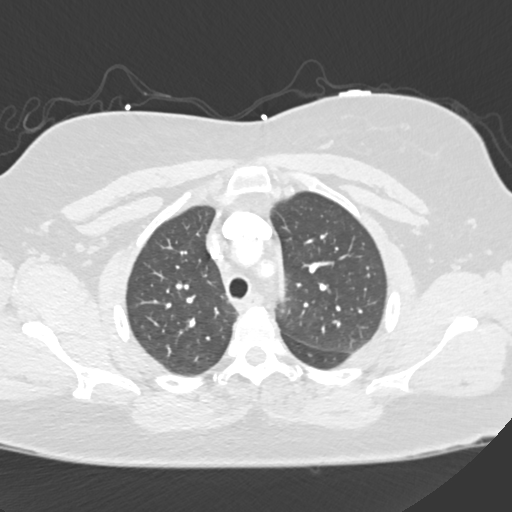
[im 210/255  soft-tissue]
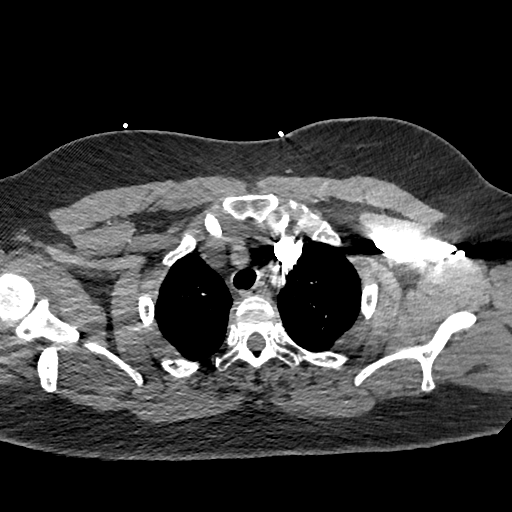
[im 221/255  lung]
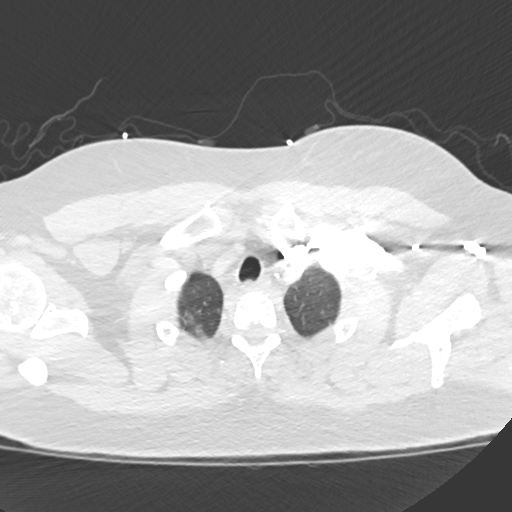
[im 243/255  soft-tissue]
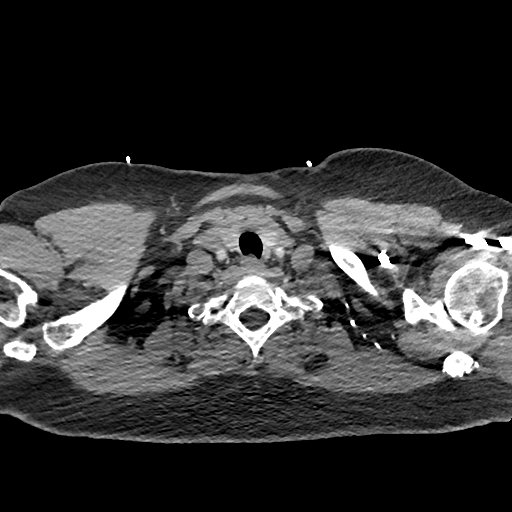

[Series 9: cor soft · coronal · 0.50mm/px · 3 of 125 slices shown]
[im 32/125  soft-tissue]
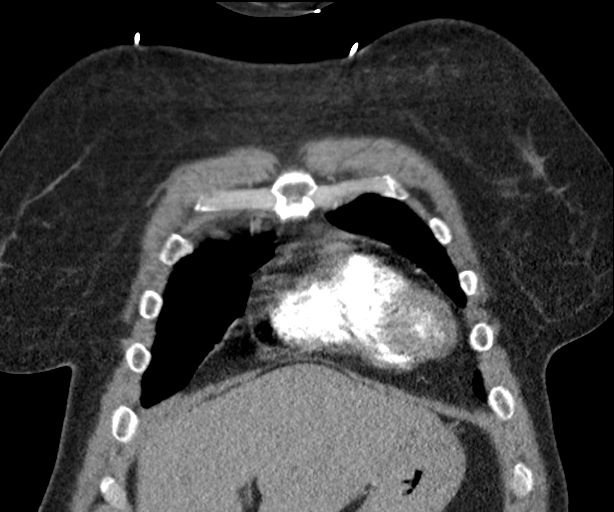
[im 63/125  soft-tissue]
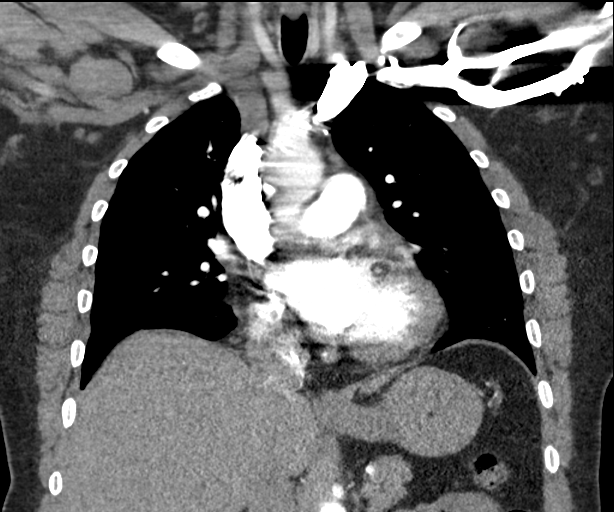
[im 94/125  soft-tissue]
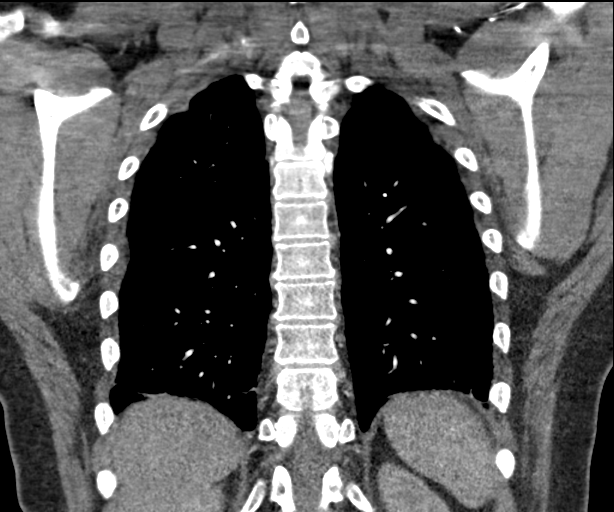

[17 of 46 positions shown; findings below may reference images not displayed]

FINDINGS: CTA CHEST FINDINGS

Cardiovascular: This is a technically adequate evaluation of the
pulmonary vasculature. No filling defects or pulmonary emboli.

The heart is unremarkable without pericardial effusion. No evidence
of thoracic aortic aneurysm or dissection.

Mediastinum/Nodes: No enlarged mediastinal, hilar, or axillary lymph
nodes. Thyroid gland, trachea, and esophagus demonstrate no
significant findings.

Lungs/Pleura: No acute airspace disease, effusion, or pneumothorax.
There is a 6 x 5 mm right lower lobe pulmonary nodule image 74/7. No
comparison imaging through this portion of the chest is available.

Other small pulmonary nodules within the bilateral costophrenic
angles are unchanged since 1025, largest measuring 3 mm in the right
lower lobe image 94 and 4 mm in the left lower lobe image 92. Given
long-term stability, these are benign.

Central airways are patent.

Musculoskeletal: No acute or destructive bony lesions. Reconstructed
images demonstrate no additional findings.

Review of the MIP images confirms the above findings.

CT ABDOMEN and PELVIS FINDINGS

Hepatobiliary: No focal liver abnormality is seen. Status post
cholecystectomy. No biliary dilatation.

Pancreas: Unremarkable. No pancreatic ductal dilatation or
surrounding inflammatory changes.

Spleen: Normal in size without focal abnormality.

Adrenals/Urinary Tract: Adrenal glands are unremarkable. Kidneys are
normal, without renal calculi, focal lesion, or hydronephrosis.
Bladder is unremarkable.

Stomach/Bowel: No bowel obstruction or ileus. Normal appendix right
lower quadrant. No bowel wall thickening or inflammatory change.

Vascular/Lymphatic: Aortic atherosclerosis. No enlarged abdominal or
pelvic lymph nodes.

Reproductive: Status post hysterectomy. No adnexal masses.

Other: No free fluid or free gas.  No abdominal wall hernia.

Musculoskeletal: No acute or destructive bony lesions. Reconstructed
images demonstrate no additional findings.

Review of the MIP images confirms the above findings.
IMPRESSION: 1. No evidence of pulmonary embolus.
2. No acute intrathoracic, intra-abdominal, or intrapelvic process.
3. 5 mm mean diameter right lower lobe pulmonary nodule without
comparison available. Numerous other bilateral lower lobe less than
4 mm nodules are stable since 1025. No follow-up needed if patient
is low-risk (and has no known or suspected primary neoplasm).
Non-contrast chest CT can be considered in 12 months if patient is
high-risk. This recommendation follows the consensus statement:
Guidelines for Management of Incidental Pulmonary Nodules Detected
4.  Aortic Atherosclerosis ([id number].D).

## 2021-06-30 ENCOUNTER — Emergency Department (HOSPITAL_COMMUNITY)
Admission: EM | Admit: 2021-06-30 | Discharge: 2021-06-30 | Disposition: A | Discharge status: Home / Self Care | Payer: BC Managed Care – PPO | Attending: Emergency Medicine | Admitting: Emergency Medicine

## 2021-06-30 ENCOUNTER — Emergency Department (HOSPITAL_COMMUNITY): Payer: BC Managed Care – PPO

## 2021-06-30 ENCOUNTER — Encounter (HOSPITAL_COMMUNITY): Payer: Self-pay

## 2021-06-30 ENCOUNTER — Other Ambulatory Visit: Payer: Self-pay

## 2021-06-30 ENCOUNTER — Telehealth: Payer: Self-pay

## 2021-06-30 DIAGNOSIS — H5711 Ocular pain, right eye: Secondary | ICD-10-CM | POA: Diagnosis not present

## 2021-06-30 DIAGNOSIS — I10 Essential (primary) hypertension: Secondary | ICD-10-CM | POA: Insufficient documentation

## 2021-06-30 DIAGNOSIS — R42 Dizziness and giddiness: Secondary | ICD-10-CM | POA: Diagnosis not present

## 2021-06-30 DIAGNOSIS — R519 Headache, unspecified: Secondary | ICD-10-CM | POA: Diagnosis not present

## 2021-06-30 DIAGNOSIS — Z79899 Other long term (current) drug therapy: Secondary | ICD-10-CM | POA: Insufficient documentation

## 2021-06-30 HISTORY — DX: Migraine, unspecified, not intractable, without status migrainosus: G43.909

## 2021-06-30 LAB — BASIC METABOLIC PANEL
Anion gap: 6 (ref 5–15)
BUN: 15 mg/dL (ref 6–20)
CO2: 29 mmol/L (ref 22–32)
Calcium: 9.3 mg/dL (ref 8.9–10.3)
Chloride: 100 mmol/L (ref 98–111)
Creatinine, Ser: 0.96 mg/dL (ref 0.44–1.00)
GFR, Estimated: >60 mL/min (ref >60)
Glucose, Bld: 107 mg/dL — ABNORMAL HIGH (ref 70–99)
Potassium: 3.5 mmol/L (ref 3.5–5.1)
Sodium: 135 mmol/L (ref 135–145)

## 2021-06-30 LAB — CBC WITH DIFFERENTIAL/PLATELET
Abs Immature Granulocytes: 0 10*3/uL (ref 0.00–0.07)
Basophils Absolute: 0 10*3/uL (ref 0.0–0.1)
Basophils Relative: 1 %
Eosinophils Absolute: 0.1 10*3/uL (ref 0.0–0.5)
Eosinophils Relative: 2 %
HCT: 39 % (ref 36.0–46.0)
Hemoglobin: 13.1 g/dL (ref 12.0–15.0)
Immature Granulocytes: 0 %
Lymphocytes Relative: 39 %
Lymphs Abs: 1.7 10*3/uL (ref 0.7–4.0)
MCH: 31 pg (ref 26.0–34.0)
MCHC: 33.6 g/dL (ref 30.0–36.0)
MCV: 92.2 fL (ref 80.0–100.0)
Monocytes Absolute: 0.4 10*3/uL (ref 0.1–1.0)
Monocytes Relative: 10 %
Neutro Abs: 2.1 10*3/uL (ref 1.7–7.7)
Neutrophils Relative %: 48 %
Platelets: 225 10*3/uL (ref 150–400)
RBC: 4.23 MIL/uL (ref 3.87–5.11)
RDW: 13.2 % (ref 11.5–15.5)
WBC: 4.4 10*3/uL (ref 4.0–10.5)
nRBC: 0 % (ref 0.0–0.2)

## 2021-06-30 MED ORDER — FLUORESCEIN SODIUM 1 MG OP STRP
1.0000 | ORAL_STRIP | Freq: Once | OPHTHALMIC | Status: AC
  Administered 2021-06-30: 1 via OPHTHALMIC
  Filled 2021-06-30: qty 1

## 2021-06-30 MED ORDER — OXYCODONE-ACETAMINOPHEN 5-325 MG PO TABS
1.0000 | ORAL_TABLET | Freq: Once | ORAL | Status: AC
  Administered 2021-06-30: 1 via ORAL
  Filled 2021-06-30: qty 1

## 2021-06-30 MED ORDER — METOCLOPRAMIDE HCL 5 MG/ML IJ SOLN
10.0000 mg | Freq: Once | INTRAMUSCULAR | Status: AC
Start: 2021-06-30 — End: 2021-06-30
  Administered 2021-06-30: 10 mg via INTRAVENOUS
  Filled 2021-06-30: qty 2

## 2021-06-30 MED ORDER — KETOROLAC TROMETHAMINE 30 MG/ML IJ SOLN: 30.0000 mg | Freq: Once | INTRAMUSCULAR | Status: DC

## 2021-06-30 MED ORDER — HYDROMORPHONE HCL 1 MG/ML IJ SOLN
1.0000 mg | Freq: Once | INTRAMUSCULAR | Status: AC
  Administered 2021-06-30: 1 mg via INTRAVENOUS
  Filled 2021-06-30: qty 1

## 2021-06-30 MED ORDER — TETRACAINE HCL 0.5 % OP SOLN
2.0000 [drp] | Freq: Once | OPHTHALMIC | Status: AC
  Administered 2021-06-30: 2 [drp] via OPHTHALMIC
  Filled 2021-06-30: qty 4

## 2021-06-30 MED ORDER — DIPHENHYDRAMINE HCL 50 MG/ML IJ SOLN
25.0000 mg | Freq: Once | INTRAMUSCULAR | Status: AC
Start: 2021-06-30 — End: 2021-06-30
  Administered 2021-06-30: 25 mg via INTRAVENOUS
  Filled 2021-06-30: qty 1

## 2021-06-30 NOTE — ED Notes (Signed)
Pt verbalized understanding of discharge instructions.

## 2021-06-30 NOTE — ED Triage Notes (Signed)
Pt arrived via POV with complaints of migraine and right eye vision loss. Pt states hx of same. Pt called eye dr yesterday and PCP today who recommend coming to the ER.

## 2021-06-30 NOTE — Telephone Encounter (Signed)
Received telephone advice record through fax machine, called patient at 8:40 am still at ED running test, spouse will have her call our office to schedule an follow up appt.

## 2021-06-30 NOTE — ED Notes (Signed)
Patient transported to CT 

## 2021-06-30 NOTE — ED Provider Notes (Signed)
[facility] EMERGENCY DEPARTMENT Provider Note   CSN: [id number] Arrival date & time: 06/30/21  [id number]     History  Chief Complaint  Patient presents with   Migraine    [name] [name] is a 56 y.o. female.  Patient complains of pain in her right eye.  She recently had cataract surgery on that right eye.  Patient also has had 2 episodes of the last 5 years where she had pain in her left eye and headache and she was evaluated with an MRI and it was felt she had had migraines.  The history is provided by the patient and medical records. No language interpreter was used.  Migraine This is a new problem. The current episode started 12 to 24 hours ago. The problem occurs constantly. The problem has not changed since onset.Pertinent negatives include no chest pain, no abdominal pain and no headaches. Nothing aggravates the symptoms. Nothing relieves the symptoms. She has tried nothing for the symptoms. The treatment provided no relief.      Home Medications Prior to Admission medications   Medication Sig Start Date End Date Taking? Authorizing Provider  ADDERALL XR 30 MG 24 hr capsule Take 30 mg by mouth daily as needed. For focusing while at work 11/03/16  Yes [provider]  albuterol (VENTOLIN HFA) 108 (90 Base) MCG/ACT inhaler TAKE 2 PUFFS BY MOUTH EVERY 6 HOURS AS NEEDED FOR WHEEZE OR SHORTNESS OF BREATH 06/19/21  Yes Lindell Spar, MD  ALPRAZolam Duanne Moron) 0.5 MG tablet Take 0.5 mg by mouth 2 (two) times daily as needed for anxiety.   Yes [provider]  amLODipine (NORVASC) 5 MG tablet TAKE 1 TABLET (5 MG TOTAL) BY MOUTH DAILY. 06/12/21  Yes Lindell Spar, MD  cetirizine (ZYRTEC) 10 MG tablet Take 1 tablet (10 mg total) by mouth daily. 06/15/21  Yes Lindell Spar, MD  estradiol (CLIMARA - DOSED IN MG/24 HR) 0.05 mg/24hr patch estradiol 0.05 mg/24 hr weekly transdermal patch  APPLY 1 PATCH BY TRANSDERMAL ROUTE EVERY WEEK   Yes [provider]  fluticasone  (FLONASE) 50 MCG/ACT nasal spray Place 1 spray into both nostrils 2 (two) times daily. 01/30/21  Yes Lindell Spar, MD  hydrochlorothiazide (HYDRODIURIL) 25 MG tablet TAKE 1 TABLET (25 MG TOTAL) BY MOUTH DAILY. 06/12/21  Yes Lindell Spar, MD  Magnesium 400 MG TABS Take 1 tablet by mouth daily. 05/08/21  Yes Lindell Spar, MD  pantoprazole (PROTONIX) 40 MG tablet Take 1 tablet (40 mg total) by mouth daily. 06/15/21  Yes Lindell Spar, MD  potassium chloride SA (KLOR-CON M) 20 MEQ tablet Take 1 tablet (20 mEq total) by mouth daily. 05/08/21  Yes Lindell Spar, MD  triamcinolone cream (KENALOG) 0.1 % Apply 1 application topically 2 (two) times daily. 06/15/21  Yes Lindell Spar, MD  Vitamin D, Ergocalciferol, (DRISDOL) 1.25 MG (50000 UNIT) CAPS capsule Take 1 capsule (50,000 Units total) by mouth every 7 (seven) days. 03/15/21  Yes Lindell Spar, MD  butalbital-acetaminophen-caffeine (FIORICET) [phone] MG tablet Take 1-2 tablets by mouth every 6 (six) hours as needed for headache. Patient not taking: Reported on 06/15/2021 04/18/21 04/18/22  Domenic Moras, PA-C  ibuprofen (ADVIL) 600 MG tablet Take 1 tablet (600 mg total) by mouth every 6 (six) hours as needed. Patient not taking: Reported on 06/15/2021 07/18/20   Lorayne Bender, PA-C  Semaglutide-Weight Management (WEGOVY) 0.25 MG/0.5ML SOAJ Inject 0.25 mg into the skin every 7 (seven) days.  Patient not taking: Reported on 06/30/2021 06/15/21   Lindell Spar, MD      Allergies    Sulfa antibiotics    Review of Systems   Review of Systems  Constitutional:  Negative for appetite change and fatigue.  HENT:  Negative for congestion, ear discharge and sinus pressure.        Right eye pain  Eyes:  Negative for discharge.  Respiratory:  Negative for cough.   Cardiovascular:  Negative for chest pain.  Gastrointestinal:  Negative for abdominal pain and diarrhea.  Genitourinary:  Negative for frequency and hematuria.  Musculoskeletal:  Negative  for back pain.  Skin:  Negative for rash.  Neurological:  Negative for seizures and headaches.  Psychiatric/Behavioral:  Negative for hallucinations.    Physical Exam Updated Vital Signs BP (!) 153/86    Pulse 63    Resp 16    Ht 5\' 2"  (1.575 m)    Wt 77.1 kg    LMP 05/21/1997    SpO2 94%    BMI 31.09 kg/m  Physical Exam Vitals and nursing note reviewed.  Constitutional:      Appearance: She is well-developed.  HENT:     Head: Normocephalic.     Nose: Nose normal.  Eyes:     General: No scleral icterus.    Comments: Conjunctiva inflamed.  Pupils small but reactive.  Fluorescein was negative.  Pressure was 25 in the right eye.  Visual acuity was 20/50  Neck:     Thyroid: No thyromegaly.  Cardiovascular:     Rate and Rhythm: Normal rate and regular rhythm.     Heart sounds: No murmur heard.   No friction rub. No gallop.  Pulmonary:     Breath sounds: No stridor. No wheezing or rales.  Chest:     Chest wall: No tenderness.  Abdominal:     General: There is no distension.     Tenderness: There is no abdominal tenderness. There is no rebound.  Musculoskeletal:        General: Normal range of motion.     Cervical back: Neck supple.  Lymphadenopathy:     Cervical: No cervical adenopathy.  Skin:    Findings: No erythema or rash.  Neurological:     Mental Status: She is alert and oriented to person, place, and time.     Motor: No abnormal muscle tone.     Coordination: Coordination normal.  Psychiatric:        Behavior: Behavior normal.    ED Results / Procedures / Treatments   Labs (all labs ordered are listed, but only abnormal results are displayed) Labs Reviewed  BASIC METABOLIC PANEL - Abnormal; Notable for the following components:      Result Value   Glucose, Bld 107 (*)    All other components within normal limits  CBC WITH DIFFERENTIAL/PLATELET    EKG None  Radiology CT Head Wo Contrast  Result Date: 06/30/2021 CLINICAL DATA:  Dizziness,  persistent/recurrent, cardiac or vascular cause suspected. Migraine headache with right eye vision loss this morning. History of hypertension and stroke. EXAM: CT HEAD WITHOUT CONTRAST TECHNIQUE: Contiguous axial images were obtained from the base of the skull through the vertex without intravenous contrast. RADIATION DOSE REDUCTION: This exam was performed according to the departmental dose-optimization program which includes automated exposure control, adjustment of the mA and/or kV according to patient size and/or use of iterative reconstruction technique. COMPARISON:  CT head 04/18/2021 FINDINGS: Brain: There is no evidence of  acute intracranial hemorrhage, mass lesion, brain edema or extra-axial fluid collection. The ventricles and subarachnoid spaces are appropriately sized for age. There is no CT evidence of acute cortical infarction. Vascular:  No hyperdense vessel identified. Skull: Negative for fracture or focal lesion. Sinuses/Orbits: The visualized paranasal sinuses and mastoid air cells are clear. No orbital abnormalities are seen. Other: None. IMPRESSION: Stable unremarkable noncontrast head CT. Electronically Signed   By: Richardean Sale M.D.   On: 06/30/2021 08:59    Procedures Procedures    Medications Ordered in ED Medications  oxyCODONE-acetaminophen (PERCOCET/ROXICET) 5-325 MG per tablet 1 tablet (has no administration in time range)  metoCLOPramide (REGLAN) injection 10 mg (10 mg Intravenous Given 06/30/21 0824)  diphenhydrAMINE (BENADRYL) injection 25 mg (25 mg Intravenous Given 06/30/21 0826)  HYDROmorphone (DILAUDID) injection 1 mg (1 mg Intravenous Given 06/30/21 0828)  tetracaine (PONTOCAINE) 0.5 % ophthalmic solution 2 drop (2 drops Right Eye Given by Other 06/30/21 1023)  fluorescein ophthalmic strip 1 strip (1 strip Right Eye Given 06/30/21 1024)    ED Course/ Medical Decision Making/ A&P                           Medical Decision Making Amount and/or Complexity of Data  Reviewed Labs: ordered. Radiology: ordered.  Risk Prescription drug management.   Patient with painful right eye.  CT scan of the head is normal.  Patient improved with pain medicine.  I got in touch with her eye md and the will see her this am if she goes over there directly.  The pt has someone to drive her    This patient presents to the ED for concern of right eye pain, this involves an extensive number of treatment options, and is a complaint that carries with it a high risk of complications and morbidity.  The differential diagnosis includes foreign body, migraine, iritis, headache   Co morbidities that complicate the patient evaluation  Hypertension   Additional history obtained:  Additional history obtained from spouse External records from outside source obtained and reviewed including hospital records   Lab Tests:  I Ordered, and personally interpreted labs.  The pertinent results include: CBC chemistries and CT head unremarkable   Imaging Studies ordered:  I ordered imaging studies including CT head I independently visualized and interpreted imaging which showed negative I agree with the radiologist interpretation   Cardiac Monitoring:  The patient was maintained on a cardiac monitor.  I personally viewed and interpreted the cardiac monitored which showed an underlying rhythm of: Normal sinus rhythm   Medicines ordered and prescription drug management:  I ordered medication including Dilaudid for pain Reevaluation of the patient after these medicines showed that the patient improved I have reviewed the patients home medicines and have made adjustments as needed   Test Considered:  MRI of the orbits   Critical Interventions:  None   Consultations Obtained:  I requested consultation with the ophthalmology,  and discussed lab and imaging findings as well as pertinent plan - they recommend: Ophthalmologist will see the patient today   Problem  List / ED Course:  Right eye pain   Reevaluation:  After the interventions noted above, I reevaluated the patient and found that they have :improved   Social Determinants of Health:  None   Dispostion:  After consideration of the diagnostic results and the patients response to treatment, I feel that the patent would benefit from discharge from ER to see the  ophthalmologist in the office and immediately.         Final Clinical Impression(s) / ED Diagnoses Final diagnoses:  Pain of right eye    Rx / DC Orders ED Discharge Orders     None         Milton Ferguson, MD 07/03/21 1155

## 2021-06-30 NOTE — ED Notes (Addendum)
Pt O2 88% on RA. Pt placed on 2L . O2 96%.

## 2021-06-30 NOTE — Discharge Instructions (Signed)
Go to the ophthalmologist office today Dr. Fabio Pierce.  The address is [address] [place], [place] [address] their phone number is [phone] please try to get there before 12:30 PM.  Although if you arrive a little bit late they will probably see you.

## 2021-07-03 ENCOUNTER — Ambulatory Visit: Payer: BC Managed Care – PPO | Admitting: Internal Medicine

## 2021-07-03 ENCOUNTER — Other Ambulatory Visit: Payer: Self-pay

## 2021-07-03 ENCOUNTER — Encounter: Payer: Self-pay | Admitting: Internal Medicine

## 2021-07-03 VITALS — BP 122/86 | HR 87 | Resp 18 | Ht 62.0 in | Wt 171.0 lb

## 2021-07-03 DIAGNOSIS — E669 Obesity, unspecified: Secondary | ICD-10-CM

## 2021-07-03 DIAGNOSIS — Z2821 Immunization not carried out because of patient refusal: Secondary | ICD-10-CM

## 2021-07-03 DIAGNOSIS — Z09 Encounter for follow-up examination after completed treatment for conditions other than malignant neoplasm: Secondary | ICD-10-CM | POA: Diagnosis not present

## 2021-07-03 DIAGNOSIS — Z9841 Cataract extraction status, right eye: Secondary | ICD-10-CM

## 2021-07-03 MED ORDER — WEGOVY 0.25 MG/0.5ML ~~LOC~~ SOAJ: 0.2500 mg | SUBCUTANEOUS | 0 refills | Status: DC

## 2021-07-03 NOTE — Progress Notes (Signed)
Established Patient Office Visit  Subjective:  Patient ID: [name] [name], female    DOB: 08-04-1965  Age: 56 y.o. MRN: [id number]  CC:  Chief Complaint  Patient presents with   Follow-up    ER follow up pt was seen 06-30-21 her right eye was infected feels better but doesn't feel like its completely healed     HPI [name] [name] is a 56 y.o. female with past medical history of HTN,  GERD/gastritis, ADD with anxiety and obesity who presents for f/u after recent ER visit.  She went to ER with right eye redness and swelling of note, she had right eye cataract surgery on 02/10 and and had run out of her eyedrop that she was supposed to use postop.  From ER, she was referred to ophthalmology and was given prednisone eyedrops.  Her swelling and redness of the right eye have improved now.  She is able to close her eyes.  Denies any vision problem currently.  Past Medical History:  Diagnosis Date   Allergy    Phreesia 09/19/2020   Anxiety    Bradycardia 03/02/2015   Depression    GERD (gastroesophageal reflux disease)    History of hysterectomy    History of oophorectomy    Hx of cholecystectomy    Hypertension    IDA (iron deficiency anemia)    Migraines    Near syncope 03/02/2015   Sciatica neuralgia, right    Stroke ([place])    "light stroke 08/2015. No deficits   Tobacco abuse 03/03/2015   Vertigo     Past Surgical History:  Procedure Laterality Date   ABDOMINAL HYSTERECTOMY     BIOPSY  12/04/2016   Procedure: BIOPSY;  Surgeon: Danie Binder, MD;  [facility];  Service: Endoscopy;;  gastric and duodenal biopsy   BIOPSY  07/30/2017   Procedure: BIOPSY;  Surgeon: Danie Binder, MD;  Location: [facility];  Service: Endoscopy;;  random colon bx's   CATARACT EXTRACTION W/PHACO Left 05/19/2021   Procedure: CATARACT EXTRACTION PHACO AND INTRAOCULAR LENS PLACEMENT ([place]);  Surgeon: Baruch Goldmann, MD;  Location: [facility];  Service: Ophthalmology;  Laterality: Left;  CDE:  3.35   CATARACT EXTRACTION W/PHACO Right 06/09/2021   Procedure: CATARACT EXTRACTION PHACO AND INTRAOCULAR LENS PLACEMENT ([facility]);  Surgeon: Baruch Goldmann, MD;  Location: [facility];  Service: Ophthalmology;  Laterality: Right;  CDE: 5.99   CESAREAN SECTION     x2   CESAREAN SECTION N/A    Phreesia 09/19/2020   CHOLECYSTECTOMY     COLONOSCOPY WITH ESOPHAGOGASTRODUODENOSCOPY ([facility])  2007   Dr. Olevia Perches: normal   COLONOSCOPY WITH PROPOFOL N/A 07/30/2017   Dr. Oneida Alar: Redundant colon, external/internal hemorrhoids. Colon otherwise normal.  Random colon biopsies negative.   ESOPHAGOGASTRODUODENOSCOPY ([facility]) WITH PROPOFOL N/A 12/04/2016   Dr. Oneida Alar: Gastritis but no H.pylori, small bowel bx negative. gastric polyps benign fundic gland   HEMORRHOID SURGERY      Family History  Problem Relation Age of Onset   ALS Mother    Cardiomyopathy Father    Colon cancer Neg Hx     Social History   Socioeconomic History   Marital status: Married    Spouse name: Not on file   Number of children: Not on file   Years of education: Not on file   Highest education level: Not on file  Occupational History   Not on file  Tobacco Use   Smoking status: Former    Packs/day: 0.10  Years: 20.00    Pack years: 2.00    Types: Cigarettes    Quit date: 02/23/2021    Years since quitting: 0.3   Smokeless tobacco: Never  Vaping Use   Vaping Use: Never used  Substance and Sexual Activity   Alcohol use: Not Currently    Comment: seldom   Drug use: Yes    Frequency: 2.0 times per week    Types: Marijuana    Comment: daily use   Sexual activity: Yes    Birth control/protection: Surgical  Other Topics Concern   Not on file  Social History Narrative   Drinks 5-6 cups of coffee daily.   Social Determinants of Health   Financial Resource Strain: Not on file  Food Insecurity: Not on file  Transportation Needs: Not on file  Physical Activity: Not on file  Stress: Not on file  Social Connections: Not on  file  Intimate Partner Violence: Not on file    Outpatient Medications Prior to Visit  Medication Sig Dispense Refill   ADDERALL XR 30 MG 24 hr capsule Take 30 mg by mouth daily as needed. For focusing while at work     albuterol (VENTOLIN HFA) 108 (90 Base) MCG/ACT inhaler TAKE 2 PUFFS BY MOUTH EVERY 6 HOURS AS NEEDED FOR WHEEZE OR SHORTNESS OF BREATH 8 g 5   ALPRAZolam (XANAX) 0.5 MG tablet Take 0.5 mg by mouth 2 (two) times daily as needed for anxiety.     amLODipine (NORVASC) 5 MG tablet TAKE 1 TABLET (5 MG TOTAL) BY MOUTH DAILY. 90 tablet 0   butalbital-acetaminophen-caffeine (FIORICET) 50-325-40 MG tablet Take 1-2 tablets by mouth every 6 (six) hours as needed for headache. 20 tablet 0   cetirizine (ZYRTEC) 10 MG tablet Take 1 tablet (10 mg total) by mouth daily. 90 tablet 3   estradiol (CLIMARA - DOSED IN MG/24 HR) 0.05 mg/24hr patch estradiol 0.05 mg/24 hr weekly transdermal patch  APPLY 1 PATCH BY TRANSDERMAL ROUTE EVERY WEEK     fluticasone (FLONASE) 50 MCG/ACT nasal spray Place 1 spray into both nostrils 2 (two) times daily. 9.9 mL 5   hydrochlorothiazide (HYDRODIURIL) 25 MG tablet TAKE 1 TABLET (25 MG TOTAL) BY MOUTH DAILY. 90 tablet 0   ibuprofen (ADVIL) 600 MG tablet Take 1 tablet (600 mg total) by mouth every 6 (six) hours as needed. 30 tablet 0   Magnesium 400 MG TABS Take 1 tablet by mouth daily. 100 tablet 2   pantoprazole (PROTONIX) 40 MG tablet Take 1 tablet (40 mg total) by mouth daily. 90 tablet 3   potassium chloride SA (KLOR-CON M) 20 MEQ tablet Take 1 tablet (20 mEq total) by mouth daily. 90 tablet 1   triamcinolone cream (KENALOG) 0.1 % Apply 1 application topically 2 (two) times daily. 30 g 0   Vitamin D, Ergocalciferol, (DRISDOL) 1.25 MG (50000 UNIT) CAPS capsule Take 1 capsule (50,000 Units total) by mouth every 7 (seven) days. 5 capsule 5   Semaglutide-Weight Management (WEGOVY) 0.25 MG/0.5ML SOAJ Inject 0.25 mg into the skin every 7 (seven) days. 2 mL 0   No  facility-administered medications prior to visit.    Allergies  Allergen Reactions   Sulfa Antibiotics Hives and Nausea And Vomiting    ROS Review of Systems  Constitutional:  Negative for chills and fever.  HENT:  Negative for congestion, sinus pressure, sinus pain and sore throat.   Eyes:  Positive for redness. Negative for discharge.  Respiratory:  Negative for cough and shortness of  breath.   Cardiovascular:  Negative for chest pain and palpitations.  Gastrointestinal:  Negative for abdominal pain, diarrhea, nausea and vomiting.  Endocrine: Negative for polydipsia and polyuria.  Genitourinary:  Negative for dysuria and hematuria.  Musculoskeletal:  Positive for arthralgias. Negative for neck pain and neck stiffness.  Skin:  Positive for rash.  Neurological:  Negative for dizziness and weakness.  Psychiatric/Behavioral:  Negative for agitation and behavioral problems. The patient is nervous/anxious.      Objective:    Physical Exam Vitals reviewed.  Constitutional:      General: She is not in acute distress.    Appearance: She is obese. She is not diaphoretic.  HENT:     Head: Normocephalic and atraumatic.     Nose: Nose normal.     Mouth/Throat:     Mouth: Mucous membranes are moist.  Eyes:     General: No scleral icterus.    Extraocular Movements: Extraocular movements intact.  Cardiovascular:     Rate and Rhythm: Normal rate and regular rhythm.     Pulses: Normal pulses.     Heart sounds: Normal heart sounds. No murmur heard. Pulmonary:     Breath sounds: Normal breath sounds. No wheezing or rales.  Musculoskeletal:     Cervical back: Neck supple. No tenderness.     Right lower leg: No edema.     Left lower leg: No edema.  Skin:    General: Skin is warm.     Findings: Rash (Lichenied skin around neck area) present.  Neurological:     General: No focal deficit present.     Mental Status: She is alert and oriented to person, place, and time.  Psychiatric:         Mood and Affect: Mood normal.        Behavior: Behavior normal.    BP 122/86 (BP Location: Left Arm, Patient Position: Sitting, Cuff Size: Normal)    Pulse 87    Resp 18    Ht 5' 2"  (1.575 m)    Wt 171 lb 0.6 oz (77.6 kg)    LMP 05/21/1997    SpO2 100%    BMI 31.28 kg/m  Wt Readings from Last 3 Encounters:  07/03/21 171 lb 0.6 oz (77.6 kg)  06/30/21 170 lb (77.1 kg)  06/15/21 173 lb (78.5 kg)    Lab Results  Component Value Date   TSH 2.620 02/07/2021   Lab Results  Component Value Date   WBC 4.4 06/30/2021   HGB 13.1 06/30/2021   HCT 39.0 06/30/2021   MCV 92.2 06/30/2021   PLT 225 06/30/2021   Lab Results  Component Value Date   NA 135 06/30/2021   K 3.5 06/30/2021   CO2 29 06/30/2021   GLUCOSE 107 (H) 06/30/2021   BUN 15 06/30/2021   CREATININE 0.96 06/30/2021   BILITOT 0.4 04/18/2021   ALKPHOS 72 04/18/2021   AST 15 04/18/2021   ALT 11 04/18/2021   PROT 5.6 (L) 04/18/2021   ALBUMIN 3.1 (L) 04/18/2021   CALCIUM 9.3 06/30/2021   ANIONGAP 6 06/30/2021   EGFR 84 02/07/2021   Lab Results  Component Value Date   CHOL 215 (H) 02/07/2021   Lab Results  Component Value Date   HDL 54 02/07/2021   Lab Results  Component Value Date   LDLCALC 133 (H) 02/07/2021   Lab Results  Component Value Date   TRIG 158 (H) 02/07/2021   Lab Results  Component Value Date   CHOLHDL 4.0  02/07/2021   Lab Results  Component Value Date   HGBA1C 6.0 (H) 02/07/2021      Assessment & Plan:   Problem List Items Addressed This Visit       Other   Obesity (BMI 30.0-34.9)    BMI Readings from Last 3 Encounters:  07/03/21 31.28 kg/m  06/30/21 31.09 kg/m  06/15/21 31.64 kg/m  Has tried to follow low carb diet and regular exercise, but unable to lose weight Good candidate for GLP1 agonist Will start Wegovy, increase dose as tolerated - needs to change pharmacy, resent prescription      Relevant Medications   Semaglutide-Weight Management (WEGOVY) 0.25 MG/0.5ML  SOAJ   Encounter for examination following treatment at hospital    ER chart reviewed including blood tests      Other Visit Diagnoses     Status post right cataract extraction    -  Primary Could be allergic/inflammatory reaction, better with Prednisone eye drop   Influenza vaccine refused           Meds ordered this encounter  Medications   Semaglutide-Weight Management (WEGOVY) 0.25 MG/0.5ML SOAJ    Sig: Inject 0.25 mg into the skin every 7 (seven) days.    Dispense:  2 mL    Refill:  0    Follow-up: Return if symptoms worsen or fail to improve.    Lindell Spar, MD

## 2021-07-03 NOTE — Assessment & Plan Note (Signed)
BMI Readings from Last 3 Encounters:  07/03/21 31.28 kg/m  06/30/21 31.09 kg/m  06/15/21 31.64 kg/m   Has tried to follow low carb diet and regular exercise, but unable to lose weight Good candidate for GLP1 agonist Will start Wegovy, increase dose as tolerated - needs to change pharmacy, resent prescription

## 2021-07-03 NOTE — Assessment & Plan Note (Signed)
ER chart reviewed including blood tests

## 2021-07-03 NOTE — Patient Instructions (Signed)
Please continue to use eye drops as prescribed.  We will check on weight loss medication.

## 2021-07-06 ENCOUNTER — Telehealth: Payer: Self-pay | Admitting: Internal Medicine

## 2021-07-06 NOTE — Telephone Encounter (Signed)
FMLA ° °NOTED °COPIED  °SLEEVED  °

## 2021-07-13 DIAGNOSIS — Z0279 Encounter for issue of other medical certificate: Secondary | ICD-10-CM

## 2021-07-13 NOTE — Telephone Encounter (Signed)
Patient spouse picked up forms.

## 2021-07-13 NOTE — Telephone Encounter (Signed)
Forms paid . Called patient, patient paid over the phone, will send husband to pick up forms, patient having dental procedure today.

## 2021-08-30 ENCOUNTER — Ambulatory Visit (INDEPENDENT_AMBULATORY_CARE_PROVIDER_SITE_OTHER): Payer: BC Managed Care – PPO | Admitting: Internal Medicine

## 2021-08-30 ENCOUNTER — Encounter: Payer: Self-pay | Admitting: Internal Medicine

## 2021-08-30 VITALS — BP 122/82 | HR 89 | Ht 62.0 in | Wt 167.0 lb

## 2021-08-30 DIAGNOSIS — G8929 Other chronic pain: Secondary | ICD-10-CM | POA: Diagnosis not present

## 2021-08-30 DIAGNOSIS — M19019 Primary osteoarthritis, unspecified shoulder: Secondary | ICD-10-CM | POA: Insufficient documentation

## 2021-08-30 DIAGNOSIS — E669 Obesity, unspecified: Secondary | ICD-10-CM

## 2021-08-30 DIAGNOSIS — M25511 Pain in right shoulder: Secondary | ICD-10-CM

## 2021-08-30 MED ORDER — LIDOCAINE 5 % EX PTCH: 1.0000 | MEDICATED_PATCH | CUTANEOUS | 0 refills | Status: DC

## 2021-08-30 MED ORDER — IBUPROFEN 600 MG PO TABS: 600.0000 mg | ORAL_TABLET | Freq: Four times a day (QID) | ORAL | 0 refills | Status: DC | PRN

## 2021-08-30 NOTE — Assessment & Plan Note (Signed)
BMI Readings from Last 3 Encounters:  ?08/30/21 30.54 kg/m?  ?07/03/21 31.28 kg/m?  ?06/30/21 31.09 kg/m?  ? ?Has tried to follow low carb diet and regular exercise ?Good candidate for GLP1 agonist - Mancel Parsons was not covered by her insurance ?Encouraged to continue to follow low-carb diet and perform moderate exercise/walking at least 150 minutes/week ?

## 2021-08-30 NOTE — Assessment & Plan Note (Signed)
X-ray of the right shoulder was unremarkable ?Could be rotator cuff tear ?Ibuprofen as needed for pain ?Lidocaine patch as needed ?Referred to orthopedic surgeon ?

## 2021-08-30 NOTE — Progress Notes (Signed)
? ?Acute Office Visit ? ?Subjective:  ? ? Patient ID: [name] [name], female    DOB: 23-Nov-1965, 56 y.o.   MRN: [id number] ? ?Chief Complaint  ?Patient presents with  ? Shoulder Pain  ?  Pt complains of pain on right shoulder.   ? ? ?HPI ?Patient is in today for complaint of right shoulder pain, which is chronic and worse with movement.  She denies any numbness or tingling of the hand currently.  X-ray of the right shoulder was unremarkable in the past.  She states that she is not able to sleep on right side due to severe shoulder pain. ? ?She has been trying to follow low-carb diet and has lost about 6 pounds since her visit in 01/23.  She was not able to get Wegovy due to insurance coverage concern.  She is encouraged to continue to follow low-carb diet and perform moderate exercise for now. ? ?Past Medical History:  ?Diagnosis Date  ? Allergy   ? Phreesia 09/19/2020  ? Anxiety   ? Bradycardia 03/02/2015  ? Depression   ? GERD (gastroesophageal reflux disease)   ? History of hysterectomy   ? History of oophorectomy   ? Hx of cholecystectomy   ? Hypertension   ? IDA (iron deficiency anemia)   ? Migraines   ? Near syncope 03/02/2015  ? Sciatica neuralgia, right   ? Stroke [facility])   ? "light stroke 08/2015. No deficits  ? Tobacco abuse 03/03/2015  ? Vertigo   ? ? ?Past Surgical History:  ?Procedure Laterality Date  ? ABDOMINAL HYSTERECTOMY    ? BIOPSY  12/04/2016  ? Procedure: BIOPSY;  Surgeon: West Bali, MD;  [facility];  Service: Endoscopy;;  gastric and duodenal biopsy  ? BIOPSY  07/30/2017  ? Procedure: BIOPSY;  Surgeon: West Bali, MD;  [facility];  Service: Endoscopy;;  random colon bx's  ? CATARACT EXTRACTION W/PHACO Left 05/19/2021  ? Procedure: CATARACT EXTRACTION PHACO AND INTRAOCULAR LENS PLACEMENT ([facility]);  Surgeon: Fabio Pierce, MD;  Location: [facility];  Service: Ophthalmology;  Laterality: Left;  CDE: 3.35  ? CATARACT EXTRACTION W/PHACO Right 06/09/2021  ? Procedure: CATARACT  EXTRACTION PHACO AND INTRAOCULAR LENS PLACEMENT ([facility]);  Surgeon: Fabio Pierce, MD;  Location: [facility];  Service: Ophthalmology;  Laterality: Right;  CDE: 5.99  ? CESAREAN SECTION    ? x2  ? CESAREAN SECTION N/A   ? Phreesia 09/19/2020  ? CHOLECYSTECTOMY    ? COLONOSCOPY WITH ESOPHAGOGASTRODUODENOSCOPY ([facility])  2007  ? Dr. Juanda Chance: normal  ? COLONOSCOPY WITH PROPOFOL N/A 07/30/2017  ? Dr. Darrick Penna: Redundant colon, external/internal hemorrhoids. Colon otherwise normal.  Random colon biopsies negative.  ? ESOPHAGOGASTRODUODENOSCOPY ([facility]) WITH PROPOFOL N/A 12/04/2016  ? Dr. Darrick Penna: Gastritis but no H.pylori, small bowel bx negative. gastric polyps benign fundic gland  ? HEMORRHOID SURGERY    ? ? ?Family History  ?Problem Relation Age of Onset  ? ALS Mother   ? Cardiomyopathy Father   ? Colon cancer Neg Hx   ? ? ?Social History  ? ?Socioeconomic History  ? Marital status: Married  ?  Spouse name: Not on file  ? Number of children: Not on file  ? Years of education: Not on file  ? Highest education level: Not on file  ?Occupational History  ? Not on file  ?Tobacco Use  ? Smoking status: Former  ?  Packs/day: 0.10  ?  Years: 20.00  ?  Pack years: 2.00  ?  Types: Cigarettes  ?  Quit date: 02/23/2021  ?  Years since quitting: 0.5  ? Smokeless tobacco: Never  ?Vaping Use  ? Vaping Use: Never used  ?Substance and Sexual Activity  ? Alcohol use: Not Currently  ?  Comment: seldom  ? Drug use: Yes  ?  Frequency: 2.0 times per week  ?  Types: Marijuana  ?  Comment: daily use  ? Sexual activity: Yes  ?  Birth control/protection: Surgical  ?Other Topics Concern  ? Not on file  ?Social History Narrative  ? Drinks 5-6 cups of coffee daily.  ? ?Social Determinants of Health  ? ?Financial Resource Strain: Not on file  ?Food Insecurity: Not on file  ?Transportation Needs: Not on file  ?Physical Activity: Not on file  ?Stress: Not on file  ?Social Connections: Not on file  ?Intimate Partner Violence: Not on file  ? ? ?Outpatient Medications  Prior to Visit  ?Medication Sig Dispense Refill  ? ADDERALL XR 30 MG 24 hr capsule Take 30 mg by mouth daily as needed. For focusing while at work    ? albuterol (VENTOLIN HFA) 108 (90 Base) MCG/ACT inhaler TAKE 2 PUFFS BY MOUTH EVERY 6 HOURS AS NEEDED FOR WHEEZE OR SHORTNESS OF BREATH 8 g 5  ? ALPRAZolam (XANAX) 0.5 MG tablet Take 0.5 mg by mouth 2 (two) times daily as needed for anxiety.    ? amLODipine (NORVASC) 5 MG tablet TAKE 1 TABLET (5 MG TOTAL) BY MOUTH DAILY. 90 tablet 0  ? butalbital-acetaminophen-caffeine (FIORICET) 50-325-40 MG tablet Take 1-2 tablets by mouth every 6 (six) hours as needed for headache. 20 tablet 0  ? cetirizine (ZYRTEC) 10 MG tablet Take 1 tablet (10 mg total) by mouth daily. 90 tablet 3  ? estradiol (CLIMARA - DOSED IN MG/24 HR) 0.05 mg/24hr patch estradiol 0.05 mg/24 hr weekly transdermal patch ? APPLY 1 PATCH BY TRANSDERMAL ROUTE EVERY WEEK    ? fluticasone (FLONASE) 50 MCG/ACT nasal spray Place 1 spray into both nostrils 2 (two) times daily. 9.9 mL 5  ? hydrochlorothiazide (HYDRODIURIL) 25 MG tablet TAKE 1 TABLET (25 MG TOTAL) BY MOUTH DAILY. 90 tablet 0  ? Magnesium 400 MG TABS Take 1 tablet by mouth daily. 100 tablet 2  ? pantoprazole (PROTONIX) 40 MG tablet Take 1 tablet (40 mg total) by mouth daily. 90 tablet 3  ? potassium chloride SA (KLOR-CON M) 20 MEQ tablet Take 1 tablet (20 mEq total) by mouth daily. 90 tablet 1  ? triamcinolone cream (KENALOG) 0.1 % Apply 1 application topically 2 (two) times daily. 30 g 0  ? Vitamin D, Ergocalciferol, (DRISDOL) 1.25 MG (50000 UNIT) CAPS capsule Take 1 capsule (50,000 Units total) by mouth every 7 (seven) days. 5 capsule 5  ? ibuprofen (ADVIL) 600 MG tablet Take 1 tablet (600 mg total) by mouth every 6 (six) hours as needed. 30 tablet 0  ? Semaglutide-Weight Management (WEGOVY) 0.25 MG/0.5ML SOAJ Inject 0.25 mg into the skin every 7 (seven) days. 2 mL 0  ? ?No facility-administered medications prior to visit.  ? ? ?Allergies  ?Allergen  Reactions  ? Sulfa Antibiotics Hives and Nausea And Vomiting  ? ? ?Review of Systems  ?Constitutional:  Negative for chills and fever.  ?HENT:  Negative for congestion, sinus pressure, sinus pain and sore throat.   ?Eyes:  Negative for discharge and redness.  ?Respiratory:  Negative for cough and shortness of breath.   ?Cardiovascular:  Negative for chest pain and palpitations.  ?Gastrointestinal:  Negative for abdominal pain, diarrhea, nausea and vomiting.  ?Endocrine: Negative for polydipsia and polyuria.  ?Genitourinary:  Negative for dysuria and hematuria.  ?Musculoskeletal:  Positive for arthralgias (Right shoulder). Negative for neck pain and neck stiffness.  ?Skin:  Positive for rash.  ?Neurological:  Negative for dizziness and weakness.  ?Psychiatric/Behavioral:  Negative for agitation and behavioral problems. The patient is nervous/anxious.   ? ?   ?Objective:  ?  ?Physical Exam ?Vitals reviewed.  ?Constitutional:   ?   General: She is not in acute distress. ?   Appearance: She is obese. She is not diaphoretic.  ?HENT:  ?   Head: Normocephalic and atraumatic.  ?   Nose: Nose normal.  ?   Mouth/Throat:  ?   Mouth: Mucous membranes are moist.  ?Eyes:  ?   General: No scleral icterus. ?   Extraocular Movements: Extraocular movements intact.  ?Cardiovascular:  ?   Rate and Rhythm: Normal rate and regular rhythm.  ?   Pulses: Normal pulses.  ?   Heart sounds: Normal heart sounds. No murmur heard. ?Pulmonary:  ?   Breath sounds: Normal breath sounds. No wheezing or rales.  ?Musculoskeletal:  ?   Cervical back: Neck supple. No tenderness.  ?   Right lower leg: No edema.  ?   Left lower leg: No edema.  ?   Comments: ROM limited on the right shoulder due to pain  ?Skin: ?   General: Skin is warm.  ?   Findings: Rash (Lichenied skin around neck area) present.  ?Neurological:  ?   General: No focal deficit present.  ?   Mental Status: She is alert and oriented to person, place, and time.  ?Psychiatric:     ?   Mood  and Affect: Mood normal.     ?   Behavior: Behavior normal.  ? ? ?BP 122/82 (BP Location: Right Arm, Patient Position: Sitting)   Pulse 89   Ht 5\' 2"  (1.575 m)   Wt 167 lb (75.8 kg)   LMP 05/21/1997   SpO2

## 2021-08-30 NOTE — Patient Instructions (Signed)
Please take Ibuprofen as needed for shoulder pain. ? ?Please apply Lidocaine patch as needed for shoulder pain. ?

## 2021-09-06 ENCOUNTER — Ambulatory Visit: Payer: BC Managed Care – PPO | Admitting: Internal Medicine

## 2021-09-07 ENCOUNTER — Ambulatory Visit: Payer: BC Managed Care – PPO | Admitting: Internal Medicine

## 2021-09-09 ENCOUNTER — Other Ambulatory Visit: Payer: Self-pay | Admitting: Internal Medicine

## 2021-09-09 DIAGNOSIS — I10 Essential (primary) hypertension: Secondary | ICD-10-CM

## 2021-09-12 ENCOUNTER — Ambulatory Visit (INDEPENDENT_AMBULATORY_CARE_PROVIDER_SITE_OTHER): Payer: BC Managed Care – PPO

## 2021-09-12 ENCOUNTER — Encounter: Payer: Self-pay | Admitting: Orthopedic Surgery

## 2021-09-12 ENCOUNTER — Ambulatory Visit: Payer: BC Managed Care – PPO | Admitting: Internal Medicine

## 2021-09-12 ENCOUNTER — Ambulatory Visit: Payer: BC Managed Care – PPO | Admitting: Orthopedic Surgery

## 2021-09-12 VITALS — BP 116/96 | HR 65 | Ht 62.0 in | Wt 168.2 lb

## 2021-09-12 DIAGNOSIS — M19011 Primary osteoarthritis, right shoulder: Secondary | ICD-10-CM | POA: Diagnosis not present

## 2021-09-12 DIAGNOSIS — G8929 Other chronic pain: Secondary | ICD-10-CM | POA: Diagnosis not present

## 2021-09-12 DIAGNOSIS — M25511 Pain in right shoulder: Secondary | ICD-10-CM | POA: Diagnosis not present

## 2021-09-12 NOTE — Progress Notes (Signed)
New Patient Visit ? ?Assessment: ?[name] [name] is a 56 y.o. female with the following: ?1. Glenohumeral arthritis, right ? ?Plan: ?[name] [name] has pain in her right shoulder.  Pain is constant.  It is progressively worsening.  It is now affecting her function.  She is difficulty sleeping at night.  Radiographs demonstrates loss of glenohumeral joint space.  She does have some small inferior osteophytes.  This was discussed with the patient today.  I think she will benefit from a steroid injection, she would like to proceed with a subacromial steroid injection today.  She may also benefit from a glenohumeral joint injection.  Continue with medications as needed.  Follow-up as needed. ? ?Procedure note injection - Right shoulder  ?  ?Verbal consent was obtained to inject the right shoulder, subacromial space ?Timeout was completed to confirm the site of injection.   ?The skin was prepped with alcohol and ethyl chloride was sprayed at the injection site.  ?A 21-gauge needle was used to inject 40 mg of Depo-Medrol and 1% lidocaine (3 cc) into the subacromial space of the right shoulder using a posterolateral approach.  ?There were no complications.  ?A sterile bandage was applied.  ? ? ?Follow-up: ?Return if symptoms worsen or fail to improve. ? ?Subjective: ? ?Chief Complaint  ?Patient presents with  ? New Patient (Initial Visit)  ? Shoulder Pain  ?  RT shoulder ?NKI  ? ? ?History of Present Illness: ?[name] [name] is a 56 y.o. female who has been referred by  Trena Platt, MD for evaluation of  ?Right shoulder pain.  She has had progressively worsening right shoulder pain for many years.  No specific injury.  The pain is constant.  She states it is within the shoulder.  No focal tenderness.  She has some difficulty with overhead motion.  Pain does get worse at night.  Over-the-counter pain medications is not improving her symptoms.  She has never had an injection.  She has not worked with physical  therapy. ? ?Review of Systems: ?No fevers or chills ?No numbness or tingling ?No chest pain ?No shortness of breath ?No bowel or bladder dysfunction ?No GI distress ?No headaches ? ? ?Medical History: ? ?Past Medical History:  ?Diagnosis Date  ? Allergy   ? Phreesia 09/19/2020  ? Anxiety   ? Bradycardia 03/02/2015  ? Depression   ? GERD (gastroesophageal reflux disease)   ? History of hysterectomy   ? History of oophorectomy   ? Hx of cholecystectomy   ? Hypertension   ? IDA (iron deficiency anemia)   ? Migraines   ? Near syncope 03/02/2015  ? Sciatica neuralgia, right   ? Stroke [facility])   ? "light stroke 08/2015. No deficits  ? Tobacco abuse 03/03/2015  ? Vertigo   ? ? ?Past Surgical History:  ?Procedure Laterality Date  ? ABDOMINAL HYSTERECTOMY    ? BIOPSY  12/04/2016  ? Procedure: BIOPSY;  Surgeon: West Bali, MD;  [facility];  Service: Endoscopy;;  gastric and duodenal biopsy  ? BIOPSY  07/30/2017  ? Procedure: BIOPSY;  Surgeon: West Bali, MD;  [facility]: [facility];  Service: Endoscopy;;  random colon bx's  ? CATARACT EXTRACTION W/PHACO Left 05/19/2021  ? Procedure: CATARACT EXTRACTION PHACO AND INTRAOCULAR LENS PLACEMENT (IOC);  Surgeon: Fabio Pierce, MD;  Location: [facility];  Service: Ophthalmology;  Laterality: Left;  CDE: 3.35  ? CATARACT EXTRACTION W/PHACO Right 06/09/2021  ? Procedure: CATARACT EXTRACTION PHACO AND  INTRAOCULAR LENS PLACEMENT (IOC);  Surgeon: Fabio Pierce, MD;  Location: [facility];  Service: Ophthalmology;  Laterality: Right;  CDE: 5.99  ? CESAREAN SECTION    ? x2  ? CESAREAN SECTION N/A   ? Phreesia 09/19/2020  ? CHOLECYSTECTOMY    ? COLONOSCOPY WITH ESOPHAGOGASTRODUODENOSCOPY (EGD)  2007  ? Dr. Juanda Chance: normal  ? COLONOSCOPY WITH PROPOFOL N/A 07/30/2017  ? Dr. Darrick Penna: Redundant colon, external/internal hemorrhoids. Colon otherwise normal.  Random colon biopsies negative.  ? ESOPHAGOGASTRODUODENOSCOPY (EGD) WITH PROPOFOL N/A 12/04/2016  ? Dr. Darrick Penna: Gastritis but no  H.pylori, small bowel bx negative. gastric polyps benign fundic gland  ? HEMORRHOID SURGERY    ? ? ?Family History  ?Problem Relation Age of Onset  ? ALS Mother   ? Cardiomyopathy Father   ? Colon cancer Neg Hx   ? ?Social History  ? ?Tobacco Use  ? Smoking status: Former  ?  Packs/day: 0.10  ?  Years: 20.00  ?  Pack years: 2.00  ?  Types: Cigarettes  ?  Quit date: 02/23/2021  ?  Years since quitting: 0.5  ? Smokeless tobacco: Never  ?Vaping Use  ? Vaping Use: Never used  ?Substance Use Topics  ? Alcohol use: Not Currently  ?  Comment: seldom  ? Drug use: Yes  ?  Frequency: 2.0 times per week  ?  Types: Marijuana  ?  Comment: daily use  ? ? ?Allergies  ?Allergen Reactions  ? Sulfa Antibiotics Hives and Nausea And Vomiting  ? ? ?Current Meds  ?Medication Sig  ? ADDERALL XR 30 MG 24 hr capsule Take 30 mg by mouth daily as needed. For focusing while at work  ? albuterol (VENTOLIN HFA) 108 (90 Base) MCG/ACT inhaler TAKE 2 PUFFS BY MOUTH EVERY 6 HOURS AS NEEDED FOR WHEEZE OR SHORTNESS OF BREATH  ? ALPRAZolam (XANAX) 0.5 MG tablet Take 0.5 mg by mouth 2 (two) times daily as needed for anxiety.  ? amLODipine (NORVASC) 5 MG tablet TAKE 1 TABLET (5 MG TOTAL) BY MOUTH DAILY.  ? butalbital-acetaminophen-caffeine (FIORICET) 50-325-40 MG tablet Take 1-2 tablets by mouth every 6 (six) hours as needed for headache.  ? cetirizine (ZYRTEC) 10 MG tablet Take 1 tablet (10 mg total) by mouth daily.  ? estradiol (CLIMARA - DOSED IN MG/24 HR) 0.05 mg/24hr patch estradiol 0.05 mg/24 hr weekly transdermal patch ? APPLY 1 PATCH BY TRANSDERMAL ROUTE EVERY WEEK  ? fluticasone (FLONASE) 50 MCG/ACT nasal spray Place 1 spray into both nostrils 2 (two) times daily.  ? hydrochlorothiazide (HYDRODIURIL) 25 MG tablet TAKE 1 TABLET (25 MG TOTAL) BY MOUTH DAILY.  ? ibuprofen (ADVIL) 600 MG tablet Take 1 tablet (600 mg total) by mouth every 6 (six) hours as needed.  ? lidocaine (LIDODERM) 5 % Place 1 patch onto the skin daily. Remove & Discard patch  within 12 hours or as directed by MD  ? Magnesium 400 MG TABS Take 1 tablet by mouth daily.  ? pantoprazole (PROTONIX) 40 MG tablet Take 1 tablet (40 mg total) by mouth daily.  ? potassium chloride SA (KLOR-CON M) 20 MEQ tablet Take 1 tablet (20 mEq total) by mouth daily.  ? triamcinolone cream (KENALOG) 0.1 % Apply 1 application topically 2 (two) times daily.  ? Vitamin D, Ergocalciferol, (DRISDOL) 1.25 MG (50000 UNIT) CAPS capsule Take 1 capsule (50,000 Units total) by mouth every 7 (seven) days.  ? ? ?Objective: ?BP (!) 116/96   Pulse 65   Ht 5\' 2"  (1.575 m)  Wt 168 lb 3.2 oz (76.3 kg)   LMP 05/21/1997   BMI 30.76 kg/m?  ? ?Physical Exam: ? ?General: Alert and oriented. and No acute distress. ?Gait: Normal gait. ? ?Right shoulder without deformity.  No atrophy is appreciated.  She has difficulty with internal rotation, can only get to her back pocket.  Slightly restricted external rotation.  Pain with active motion above the level of her shoulder.  Pain with strength testing.  Fingers are warm and well-perfused. ? ?IMAGING: ?I personally ordered and reviewed the following images ? ?X-rays of the right shoulder were obtained in clinic today.  No acute injuries are noted.  She has mild to moderate loss of joint space within the glenohumeral joint.  There are some small osteophytes in the inferior aspect of the humeral head.  No proximal humeral migration. ? ?Impression: Mild to moderate right glenohumeral joint arthritis ? ?New Medications:  ?No orders of the defined types were placed in this encounter. ? ? ? ? ?Oliver BarreMark A Isabeau Mccalla, MD ? ?09/13/2021 ?8:31 AM ? ? ?

## 2021-09-12 NOTE — Patient Instructions (Signed)

## 2021-09-22 ENCOUNTER — Encounter: Payer: Self-pay | Admitting: Family Medicine

## 2021-09-22 ENCOUNTER — Telehealth: Payer: Self-pay | Admitting: Family Medicine

## 2021-09-22 ENCOUNTER — Ambulatory Visit (INDEPENDENT_AMBULATORY_CARE_PROVIDER_SITE_OTHER): Payer: Self-pay | Admitting: Family Medicine

## 2021-09-22 DIAGNOSIS — M25551 Pain in right hip: Secondary | ICD-10-CM

## 2021-09-22 MED ORDER — CYCLOBENZAPRINE HCL 5 MG PO TABS
5.0000 mg | ORAL_TABLET | Freq: Three times a day (TID) | ORAL | 1 refills | Status: DC | PRN
Start: 2021-09-22 — End: 2022-07-10

## 2021-09-22 NOTE — Telephone Encounter (Signed)
Work note ready for pick up at the front desk.  

## 2021-09-22 NOTE — Telephone Encounter (Signed)
Pt just left and called back stating she needed a note to be out of work, can you please write this? ?
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
## 2021-09-22 NOTE — Patient Instructions (Addendum)
I appreciate the opportunity to provide care to you today! ?  ?Please pick up your medication at the pharmacy. ? ?Please stop by [facility] to get an X-ray of your  hip ? ?You can take OTC ibuprofen and Tylenol for pain and apply Ice and heat the affect site of discomfort.  ? ? ?  ?It was a pleasure to see you and I look forward to continuing to work together on your health and well-being. ?Please do not hesitate to call the office if you need care or have questions about your care. ?  ?Have a wonderful day and week. ?With Gratitude, ?Gilmore Laroche MSN, FNP-BC  ?

## 2021-09-23 NOTE — Assessment & Plan Note (Addendum)
-  Flexeril ordered ?-Encouraged conservative managements and OTC motrin and tylenol for pain ?-X-ray of the hips ordered ? ? ?

## 2021-09-26 ENCOUNTER — Other Ambulatory Visit: Payer: Self-pay | Admitting: Family Medicine

## 2021-10-11 DIAGNOSIS — F4310 Post-traumatic stress disorder, unspecified: Secondary | ICD-10-CM | POA: Diagnosis not present

## 2021-10-11 DIAGNOSIS — F900 Attention-deficit hyperactivity disorder, predominantly inattentive type: Secondary | ICD-10-CM | POA: Diagnosis not present

## 2021-10-24 ENCOUNTER — Telehealth: Payer: Self-pay

## 2021-10-24 NOTE — Telephone Encounter (Signed)
Patient called need all her meds sent into her pharmacy   pantoprazole (PROTONIX) 40 MG tablet   albuterol (VENTOLIN HFA) 108 (90 Base) MCG/ACT inhaler  amLODipine (NORVASC) 5 MG tablet [  cetirizine (ZYRTEC) 10 MG tablet  fluticasone (FLONASE) 50 MCG/ACT nasal spray  hydrochlorothiazide (HYDRODIURIL) 25 MG tablet [id number]   ibuprofen (ADVIL) 600 MG tablet   lidocaine (LIDODERM) 5 %  Magnesium 400 MG TABS  Vitamin D, Ergocalciferol, (DRISDOL) 1.25 MG (50000 UNIT) CAPS capsule   Pharmacy: [facility]

## 2021-10-25 ENCOUNTER — Other Ambulatory Visit: Payer: Self-pay | Admitting: *Deleted

## 2021-10-25 DIAGNOSIS — G8929 Other chronic pain: Secondary | ICD-10-CM

## 2021-10-25 DIAGNOSIS — I10 Essential (primary) hypertension: Secondary | ICD-10-CM

## 2021-10-25 DIAGNOSIS — E559 Vitamin D deficiency, unspecified: Secondary | ICD-10-CM

## 2021-10-25 DIAGNOSIS — K219 Gastro-esophageal reflux disease without esophagitis: Secondary | ICD-10-CM

## 2021-10-25 DIAGNOSIS — J3089 Other allergic rhinitis: Secondary | ICD-10-CM

## 2021-10-25 MED ORDER — ALBUTEROL SULFATE HFA 108 (90 BASE) MCG/ACT IN AERS: INHALATION_SPRAY | RESPIRATORY_TRACT | 5 refills | Status: AC

## 2021-10-25 MED ORDER — MAGNESIUM 400 MG PO TABS: 1.0000 | ORAL_TABLET | Freq: Every day | ORAL | 2 refills | Status: DC

## 2021-10-25 MED ORDER — PANTOPRAZOLE SODIUM 40 MG PO TBEC: 40.0000 mg | DELAYED_RELEASE_TABLET | Freq: Every day | ORAL | 3 refills | Status: DC

## 2021-10-25 MED ORDER — HYDROCHLOROTHIAZIDE 25 MG PO TABS: 25.0000 mg | ORAL_TABLET | Freq: Every day | ORAL | 0 refills | Status: DC

## 2021-10-25 MED ORDER — AMLODIPINE BESYLATE 5 MG PO TABS: 5.0000 mg | ORAL_TABLET | Freq: Every day | ORAL | 0 refills | Status: DC

## 2021-10-25 MED ORDER — IBUPROFEN 600 MG PO TABS: 600.0000 mg | ORAL_TABLET | Freq: Four times a day (QID) | ORAL | 0 refills | Status: DC | PRN

## 2021-10-25 MED ORDER — LIDOCAINE 5 % EX PTCH: 1.0000 | MEDICATED_PATCH | CUTANEOUS | 0 refills | Status: DC

## 2021-10-25 MED ORDER — VITAMIN D (ERGOCALCIFEROL) 1.25 MG (50000 UNIT) PO CAPS: 50000.0000 [IU] | ORAL_CAPSULE | ORAL | 5 refills | Status: DC

## 2021-10-25 MED ORDER — CETIRIZINE HCL 10 MG PO TABS: 10.0000 mg | ORAL_TABLET | Freq: Every day | ORAL | 3 refills | Status: DC

## 2021-10-25 MED ORDER — FLUTICASONE PROPIONATE 50 MCG/ACT NA SUSP: 1.0000 | Freq: Two times a day (BID) | NASAL | 5 refills | Status: DC

## 2021-10-25 NOTE — Telephone Encounter (Signed)
All medications sent to [facility]

## 2021-10-31 ENCOUNTER — Telehealth: Payer: Self-pay

## 2021-10-31 NOTE — Telephone Encounter (Signed)
Medical Information and Restriction Form (FOR HIP)  Copied Noted sleeved

## 2021-11-02 DIAGNOSIS — M169 Osteoarthritis of hip, unspecified: Secondary | ICD-10-CM | POA: Insufficient documentation

## 2021-11-02 DIAGNOSIS — M25551 Pain in right hip: Secondary | ICD-10-CM | POA: Insufficient documentation

## 2021-11-11 ENCOUNTER — Other Ambulatory Visit: Payer: Self-pay | Admitting: Internal Medicine

## 2021-11-11 DIAGNOSIS — E876 Hypokalemia: Secondary | ICD-10-CM

## 2021-11-12 ENCOUNTER — Other Ambulatory Visit: Payer: Self-pay | Admitting: Internal Medicine

## 2021-11-12 DIAGNOSIS — I10 Essential (primary) hypertension: Secondary | ICD-10-CM

## 2021-12-11 ENCOUNTER — Other Ambulatory Visit: Payer: Self-pay | Admitting: Internal Medicine

## 2021-12-11 DIAGNOSIS — I10 Essential (primary) hypertension: Secondary | ICD-10-CM

## 2022-02-06 DIAGNOSIS — Z01419 Encounter for gynecological examination (general) (routine) without abnormal findings: Secondary | ICD-10-CM | POA: Diagnosis not present

## 2022-02-06 DIAGNOSIS — Z1231 Encounter for screening mammogram for malignant neoplasm of breast: Secondary | ICD-10-CM | POA: Diagnosis not present

## 2022-02-06 DIAGNOSIS — Z13220 Encounter for screening for lipoid disorders: Secondary | ICD-10-CM | POA: Diagnosis not present

## 2022-02-06 DIAGNOSIS — Z6830 Body mass index (BMI) 30.0-30.9, adult: Secondary | ICD-10-CM | POA: Diagnosis not present

## 2022-02-06 DIAGNOSIS — Z130 Encounter for screening for diseases of the blood and blood-forming organs and certain disorders involving the immune mechanism: Secondary | ICD-10-CM | POA: Diagnosis not present

## 2022-02-06 DIAGNOSIS — Z1329 Encounter for screening for other suspected endocrine disorder: Secondary | ICD-10-CM | POA: Diagnosis not present

## 2022-02-06 LAB — HM MAMMOGRAPHY

## 2022-03-16 ENCOUNTER — Other Ambulatory Visit: Payer: Self-pay | Admitting: Internal Medicine

## 2022-03-16 ENCOUNTER — Telehealth: Payer: Self-pay | Admitting: Internal Medicine

## 2022-03-16 ENCOUNTER — Other Ambulatory Visit: Payer: Self-pay

## 2022-03-16 DIAGNOSIS — I10 Essential (primary) hypertension: Secondary | ICD-10-CM

## 2022-03-16 DIAGNOSIS — J3089 Other allergic rhinitis: Secondary | ICD-10-CM

## 2022-03-16 MED ORDER — HYDROCHLOROTHIAZIDE 25 MG PO TABS: 25.0000 mg | ORAL_TABLET | Freq: Every day | ORAL | 0 refills | Status: DC

## 2022-03-16 NOTE — Telephone Encounter (Signed)
Patient needs refill on   hydrochlorothiazide (HYDRODIURIL) 25 MG tablet   Patient is out of med

## 2022-03-20 ENCOUNTER — Telehealth: Payer: Self-pay | Admitting: Internal Medicine

## 2022-03-20 NOTE — Telephone Encounter (Signed)
NA NVM to speak with patient

## 2022-03-20 NOTE — Telephone Encounter (Signed)
Pt called stating she is having an issue with her medications. Requested to speak to nurse about this issue. Can you please call patient?

## 2022-03-21 NOTE — Telephone Encounter (Signed)
Pt advised with verbal understanding  °

## 2022-03-21 NOTE — Telephone Encounter (Signed)
Pt returned call

## 2022-03-21 NOTE — Telephone Encounter (Signed)
Patient called about pantoprazole she did not realize this was only once a day and has been taking twice a day.   She is out of medication and pharmacy will not fill this medication as insurance will not cover this.   Wants to know what she needs to do in the mean time as they will not fill again until the end of November

## 2022-04-18 DIAGNOSIS — F900 Attention-deficit hyperactivity disorder, predominantly inattentive type: Secondary | ICD-10-CM | POA: Diagnosis not present

## 2022-04-18 DIAGNOSIS — F4310 Post-traumatic stress disorder, unspecified: Secondary | ICD-10-CM | POA: Diagnosis not present

## 2022-04-30 ENCOUNTER — Telehealth: Payer: Self-pay | Admitting: Internal Medicine

## 2022-04-30 ENCOUNTER — Ambulatory Visit (INDEPENDENT_AMBULATORY_CARE_PROVIDER_SITE_OTHER): Payer: BC Managed Care – PPO | Admitting: Internal Medicine

## 2022-04-30 ENCOUNTER — Encounter: Payer: Self-pay | Admitting: Internal Medicine

## 2022-04-30 VITALS — BP 139/86 | HR 103 | Ht 62.0 in | Wt 167.0 lb

## 2022-04-30 DIAGNOSIS — E876 Hypokalemia: Secondary | ICD-10-CM

## 2022-04-30 DIAGNOSIS — R7303 Prediabetes: Secondary | ICD-10-CM

## 2022-04-30 DIAGNOSIS — Z23 Encounter for immunization: Secondary | ICD-10-CM

## 2022-04-30 DIAGNOSIS — M25551 Pain in right hip: Secondary | ICD-10-CM

## 2022-04-30 DIAGNOSIS — Z2821 Immunization not carried out because of patient refusal: Secondary | ICD-10-CM

## 2022-04-30 DIAGNOSIS — E782 Mixed hyperlipidemia: Secondary | ICD-10-CM

## 2022-04-30 DIAGNOSIS — I10 Essential (primary) hypertension: Secondary | ICD-10-CM

## 2022-04-30 DIAGNOSIS — E559 Vitamin D deficiency, unspecified: Secondary | ICD-10-CM | POA: Diagnosis not present

## 2022-04-30 DIAGNOSIS — R5382 Chronic fatigue, unspecified: Secondary | ICD-10-CM | POA: Diagnosis not present

## 2022-04-30 DIAGNOSIS — E538 Deficiency of other specified B group vitamins: Secondary | ICD-10-CM

## 2022-04-30 DIAGNOSIS — G4733 Obstructive sleep apnea (adult) (pediatric): Secondary | ICD-10-CM

## 2022-04-30 MED ORDER — CYANOCOBALAMIN 1000 MCG/ML IJ SOLN
1000.0000 ug | Freq: Once | INTRAMUSCULAR | Status: AC
  Administered 2022-04-30: 1000 ug via INTRAMUSCULAR

## 2022-04-30 MED ORDER — DICLOFENAC SODIUM 75 MG PO TBEC: 75.0000 mg | DELAYED_RELEASE_TABLET | Freq: Two times a day (BID) | ORAL | 0 refills | Status: DC

## 2022-04-30 MED ORDER — AMLODIPINE BESYLATE 5 MG PO TABS: 5.0000 mg | ORAL_TABLET | Freq: Every day | ORAL | 1 refills | Status: DC

## 2022-04-30 MED ORDER — HYDROCHLOROTHIAZIDE 25 MG PO TABS: 25.0000 mg | ORAL_TABLET | Freq: Every day | ORAL | 1 refills | Status: DC

## 2022-04-30 NOTE — Telephone Encounter (Signed)
Patient was seen today by Dr patel, she is asking what about the order for sleep apnea for home sleep.  Please contact patient today

## 2022-04-30 NOTE — Patient Instructions (Signed)
Please continue taking medications as prescribed.  Please continue to follow low salt diet and perform moderate exercise/walking at least 150 mins/week. 

## 2022-04-30 NOTE — Telephone Encounter (Signed)
Patient advised.

## 2022-05-01 ENCOUNTER — Other Ambulatory Visit: Payer: Self-pay | Admitting: Internal Medicine

## 2022-05-01 DIAGNOSIS — E876 Hypokalemia: Secondary | ICD-10-CM

## 2022-05-01 DIAGNOSIS — E559 Vitamin D deficiency, unspecified: Secondary | ICD-10-CM

## 2022-05-01 DIAGNOSIS — E782 Mixed hyperlipidemia: Secondary | ICD-10-CM

## 2022-05-01 LAB — LIPID PANEL
Chol/HDL Ratio: 4.5 ratio — ABNORMAL HIGH (ref 0.0–4.4)
Cholesterol, Total: 232 mg/dL — ABNORMAL HIGH (ref 100–199)
HDL: 51 mg/dL (ref >39)
LDL Chol Calc (NIH): 154 mg/dL — ABNORMAL HIGH (ref 0–99)
Triglycerides: 149 mg/dL (ref 0–149)
VLDL Cholesterol Cal: 27 mg/dL (ref 5–40)

## 2022-05-01 LAB — CBC WITH DIFFERENTIAL/PLATELET
Basophils Absolute: 0 10*3/uL (ref 0.0–0.2)
Basos: 1 %
EOS (ABSOLUTE): 0.1 10*3/uL (ref 0.0–0.4)
Eos: 2 %
Hematocrit: 40.4 % (ref 34.0–46.6)
Hemoglobin: 13.3 g/dL (ref 11.1–15.9)
Immature Grans (Abs): 0 10*3/uL (ref 0.0–0.1)
Immature Granulocytes: 0 %
Lymphocytes Absolute: 2.5 10*3/uL (ref 0.7–3.1)
Lymphs: 42 %
MCH: 30.2 pg (ref 26.6–33.0)
MCHC: 32.9 g/dL (ref 31.5–35.7)
MCV: 92 fL (ref 79–97)
Monocytes Absolute: 0.5 10*3/uL (ref 0.1–0.9)
Monocytes: 8 %
Neutrophils Absolute: 2.7 10*3/uL (ref 1.4–7.0)
Neutrophils: 47 %
Platelets: 237 10*3/uL (ref 150–450)
RBC: 4.41 x10E6/uL (ref 3.77–5.28)
RDW: 12.7 % (ref 11.7–15.4)
WBC: 5.9 10*3/uL (ref 3.4–10.8)

## 2022-05-01 LAB — CMP14+EGFR
ALT: 16 IU/L (ref 0–32)
AST: 19 IU/L (ref 0–40)
Albumin/Globulin Ratio: 1.6 (ref 1.2–2.2)
Albumin: 4.3 g/dL (ref 3.8–4.9)
Alkaline Phosphatase: 103 IU/L (ref 44–121)
BUN/Creatinine Ratio: 15 (ref 9–23)
BUN: 13 mg/dL (ref 6–24)
Bilirubin Total: <0.2 mg/dL (ref 0.0–1.2)
CO2: 25 mmol/L (ref 20–29)
Calcium: 10.1 mg/dL (ref 8.7–10.2)
Chloride: 97 mmol/L (ref 96–106)
Creatinine, Ser: 0.87 mg/dL (ref 0.57–1.00)
Globulin, Total: 2.7 g/dL (ref 1.5–4.5)
Glucose: 90 mg/dL (ref 70–99)
Potassium: 3.4 mmol/L — ABNORMAL LOW (ref 3.5–5.2)
Sodium: 138 mmol/L (ref 134–144)
Total Protein: 7 g/dL (ref 6.0–8.5)
eGFR: 79 mL/min/{1.73_m2} (ref >59)

## 2022-05-01 LAB — TSH: TSH: 2.79 u[IU]/mL (ref 0.450–4.500)

## 2022-05-01 LAB — HEMOGLOBIN A1C
Est. average glucose Bld gHb Est-mCnc: 120 mg/dL
Hgb A1c MFr Bld: 5.8 % — ABNORMAL HIGH (ref 4.8–5.6)

## 2022-05-01 LAB — VITAMIN D 25 HYDROXY (VIT D DEFICIENCY, FRACTURES): Vit D, 25-Hydroxy: 20.2 ng/mL — ABNORMAL LOW (ref 30.0–100.0)

## 2022-05-01 MED ORDER — ROSUVASTATIN CALCIUM 10 MG PO TABS: 10.0000 mg | ORAL_TABLET | Freq: Every day | ORAL | 1 refills | Status: DC

## 2022-05-01 MED ORDER — POTASSIUM CHLORIDE CRYS ER 20 MEQ PO TBCR: 20.0000 meq | EXTENDED_RELEASE_TABLET | Freq: Every day | ORAL | 1 refills | Status: DC

## 2022-05-01 MED ORDER — VITAMIN D (ERGOCALCIFEROL) 1.25 MG (50000 UNIT) PO CAPS: 50000.0000 [IU] | ORAL_CAPSULE | ORAL | 1 refills | Status: DC

## 2022-05-04 DIAGNOSIS — G4733 Obstructive sleep apnea (adult) (pediatric): Secondary | ICD-10-CM | POA: Insufficient documentation

## 2022-05-04 NOTE — Assessment & Plan Note (Signed)
Lab Results  Component Value Date   HGBA1C 5.8 (H) 04/30/2022   advised to follow DASH diet for now

## 2022-05-04 NOTE — Assessment & Plan Note (Addendum)
Her fatigue appears to be likely due to her ADD and/or OSA ADD is uncontrolled despite taking Adderall She also has hypokalemia, likely due to HCTZ - needs to take Klor-Con regularly No B symptoms Currently up-to-date with mammogram and colonoscopy

## 2022-05-04 NOTE — Assessment & Plan Note (Signed)
Could be due to dehydration Started Klor-con for now If persistent, will have to DC HCTZ

## 2022-05-04 NOTE — Assessment & Plan Note (Signed)
Has snoring and chronic fatigue STOP-BANG: 5 At high risk of having OSA Home sleep study ordered

## 2022-05-04 NOTE — Assessment & Plan Note (Signed)
Lipid profile reviewed Needs to follow low cholesterol diet- DASH diet material provided

## 2022-05-04 NOTE — Assessment & Plan Note (Signed)
Chronic right hip pain, likely due to OA of hip and/or bursitis Started diclofenac tablets as needed for now Referred to Orthopedic surgeon

## 2022-05-04 NOTE — Progress Notes (Addendum)
Acute Office Visit  Subjective:    Patient ID: [name] [name], female    DOB: 06-19-1965, 56 y.o.   MRN: [id number]  Chief Complaint  Patient presents with   Hip Pain   Fatigue    HPI Patient is in today for some complaint of right hip pain, which is constant, sharp, nonradiating, worse with movement and better with rest.  She denies any recent injury.  She has tried ibuprofen without much relief.  She also complains of chronic fatigue, which is recently worse.  She denies any fever or chills.  Denies any nausea, vomiting, chronic cough, hemoptysis, night sweats or LAD.  Of note, she has history of GAD and ADD, by psychiatry.  She is currently taking Xanax as needed for anxiety.  She is also on Adderall for ADD.  Her BP is WNL.  She reports snoring at nighttime.   Past Medical History:  Diagnosis Date   Allergy    Phreesia 09/19/2020   Anxiety    Bradycardia 03/02/2015   Depression    GERD (gastroesophageal reflux disease)    History of hysterectomy    History of oophorectomy    Hx of cholecystectomy    Hypertension    IDA (iron deficiency anemia)    Migraines    Near syncope 03/02/2015   Sciatica neuralgia, right    Stroke ([place])    "light stroke 08/2015. No deficits   Tobacco abuse 03/03/2015   Vertigo     Past Surgical History:  Procedure Laterality Date   ABDOMINAL HYSTERECTOMY     BIOPSY  12/04/2016   Procedure: BIOPSY;  Surgeon: Danie Binder, MD;  Location: [facility];  Service: Endoscopy;;  gastric and duodenal biopsy   BIOPSY  07/30/2017   Procedure: BIOPSY;  Surgeon: Danie Binder, MD;  Location: [facility];  Service: Endoscopy;;  random colon bx's   CATARACT EXTRACTION W/PHACO Left 05/19/2021   Procedure: CATARACT EXTRACTION PHACO AND INTRAOCULAR LENS PLACEMENT ([place]);  Surgeon: Baruch Goldmann, MD;  Location: [facility];  Service: Ophthalmology;  Laterality: Left;  CDE: 3.35   CATARACT EXTRACTION W/PHACO Right 06/09/2021   Procedure: CATARACT EXTRACTION  PHACO AND INTRAOCULAR LENS PLACEMENT ([facility]);  Surgeon: Baruch Goldmann, MD;  Location: [facility];  Service: Ophthalmology;  Laterality: Right;  CDE: 5.99   CESAREAN SECTION     x2   CESAREAN SECTION N/A    Phreesia 09/19/2020   CHOLECYSTECTOMY     COLONOSCOPY WITH ESOPHAGOGASTRODUODENOSCOPY (EGD)  2007   Dr. Olevia Perches: normal   COLONOSCOPY WITH PROPOFOL N/A 07/30/2017   Dr. Oneida Alar: Redundant colon, external/internal hemorrhoids. Colon otherwise normal.  Random colon biopsies negative.   ESOPHAGOGASTRODUODENOSCOPY (EGD) WITH PROPOFOL N/A 12/04/2016   Dr. Oneida Alar: Gastritis but no H.pylori, small bowel bx negative. gastric polyps benign fundic gland   HEMORRHOID SURGERY      Family History  Problem Relation Age of Onset   ALS Mother    Cardiomyopathy Father    Colon cancer Neg Hx     Social History   Socioeconomic History   Marital status: Married    Spouse name: Not on file   Number of children: Not on file   Years of education: Not on file   Highest education level: Not on file  Occupational History   Not on file  Tobacco Use   Smoking status: Former    Packs/day: 0.10    Years: 20.00    Total pack years: 2.00    Types: Cigarettes  Quit date: 02/23/2021    Years since quitting: 1.1   Smokeless tobacco: Never  Vaping Use   Vaping Use: Never used  Substance and Sexual Activity   Alcohol use: Not Currently    Comment: seldom   Drug use: Yes    Frequency: 2.0 times per week    Types: Marijuana    Comment: daily use   Sexual activity: Yes    Birth control/protection: Surgical  Other Topics Concern   Not on file  Social History Narrative   Drinks 5-6 cups of coffee daily.   Social Determinants of Health   Financial Resource Strain: Not on file  Food Insecurity: Not on file  Transportation Needs: Not on file  Physical Activity: Not on file  Stress: Not on file  Social Connections: Not on file  Intimate Partner Violence: Not on file    Outpatient Medications Prior to  Visit  Medication Sig Dispense Refill   ADDERALL XR 30 MG 24 hr capsule Take 30 mg by mouth daily as needed. For focusing while at work     albuterol (VENTOLIN HFA) 108 (90 Base) MCG/ACT inhaler TAKE 2 PUFFS BY MOUTH EVERY 6 HOURS AS NEEDED FOR WHEEZE OR SHORTNESS OF BREATH 8 g 5   ALPRAZolam (XANAX) 0.5 MG tablet Take 0.5 mg by mouth 2 (two) times daily as needed for anxiety.     cetirizine (ZYRTEC) 10 MG tablet Take 1 tablet (10 mg total) by mouth daily. 90 tablet 3   cyclobenzaprine (FLEXERIL) 5 MG tablet Take 1 tablet (5 mg total) by mouth 3 (three) times daily as needed for muscle spasms. 30 tablet 1   estradiol (CLIMARA - DOSED IN MG/24 HR) 0.05 mg/24hr patch estradiol 0.05 mg/24 hr weekly transdermal patch  APPLY 1 PATCH BY TRANSDERMAL ROUTE EVERY WEEK     fluticasone (FLONASE) 50 MCG/ACT nasal spray PLACE 1 SPRAY INTO BOTH NOSTRILS 2 (TWO) TIMES DAILY 48 mL 1   lidocaine (LIDODERM) 5 % Place 1 patch onto the skin daily. Remove & Discard patch within 12 hours or as directed by MD 30 patch 0   Magnesium 400 MG TABS Take 1 tablet by mouth daily. 100 tablet 2   pantoprazole (PROTONIX) 40 MG tablet Take 1 tablet (40 mg total) by mouth daily. 90 tablet 3   triamcinolone cream (KENALOG) 0.1 % Apply 1 application topically 2 (two) times daily. 30 g 0   amLODipine (NORVASC) 5 MG tablet TAKE 1 TABLET (5 MG TOTAL) BY MOUTH DAILY. 90 tablet 0   hydrochlorothiazide (HYDRODIURIL) 25 MG tablet TAKE (1) TABLET BY MOUTH ONCE DAILY. 90 tablet 0   hydrochlorothiazide (HYDRODIURIL) 25 MG tablet Take 1 tablet (25 mg total) by mouth daily. 90 tablet 0   ibuprofen (ADVIL) 600 MG tablet Take 1 tablet (600 mg total) by mouth every 6 (six) hours as needed. 30 tablet 0   KLOR-CON M20 20 MEQ tablet TAKE 1 TABLET BY MOUTH EVERY DAY 90 tablet 1   Vitamin D, Ergocalciferol, (DRISDOL) 1.25 MG (50000 UNIT) CAPS capsule Take 1 capsule (50,000 Units total) by mouth every 7 (seven) days. 5 capsule 5   No  facility-administered medications prior to visit.    Allergies  Allergen Reactions   Sulfa Antibiotics Hives and Nausea And Vomiting    Review of Systems  Constitutional:  Negative for chills and fever.  HENT:  Negative for congestion, sinus pressure, sinus pain and sore throat.   Eyes:  Negative for discharge and redness.  Respiratory:  Negative  for cough and shortness of breath.   Cardiovascular:  Negative for chest pain and palpitations.  Gastrointestinal:  Negative for abdominal pain, diarrhea, nausea and vomiting.  Endocrine: Negative for polydipsia and polyuria.  Genitourinary:  Negative for dysuria and hematuria.  Musculoskeletal:  Positive for arthralgias (Right hip). Negative for neck pain and neck stiffness.  Skin:  Negative for rash.  Neurological:  Negative for dizziness and weakness.  Psychiatric/Behavioral:  Negative for agitation and behavioral problems. The patient is nervous/anxious.        Objective:    Physical Exam Vitals reviewed.  Constitutional:      General: She is not in acute distress.    Appearance: She is obese. She is not diaphoretic.  HENT:     Head: Normocephalic and atraumatic.     Nose: Nose normal.     Mouth/Throat:     Mouth: Mucous membranes are moist.  Eyes:     General: No scleral icterus.    Extraocular Movements: Extraocular movements intact.  Cardiovascular:     Rate and Rhythm: Normal rate and regular rhythm.     Pulses: Normal pulses.     Heart sounds: Normal heart sounds. No murmur heard. Pulmonary:     Breath sounds: Normal breath sounds. No wheezing or rales.  Musculoskeletal:     Cervical back: Neck supple. No tenderness.     Right lower leg: No edema.     Left lower leg: No edema.     Comments: ROM limited on the right hip due to pain  Skin:    General: Skin is warm.     Findings: Rash (Lichenied skin around neck area) present.  Neurological:     General: No focal deficit present.     Mental Status: She is alert  and oriented to person, place, and time.  Psychiatric:        Mood and Affect: Mood normal.        Behavior: Behavior normal.     BP 139/86 (BP Location: Left Arm, Patient Position: Sitting, Cuff Size: Normal)   Pulse (!) 103   Ht _0  (1.575 m)   Wt 167 lb (75.8 kg)   LMP 05/21/1997   SpO2 94%   BMI 30.54 kg/m  Wt Readings from Last 3 Encounters:  04/30/22 167 lb (75.8 kg)  09/22/21 167 lb 12.8 oz (76.1 kg)  09/12/21 168 lb 3.2 oz (76.3 kg)        Assessment & Plan:   Problem List Items Addressed This Visit       Cardiovascular and Mediastinum   Essential hypertension - Primary    BP Readings from Last 1 Encounters:  04/30/22 139/86  Well-controlled with HCTZ to 25 mg daily and amlodipine 5 mg daily now Counseled for compliance with the medications Advised DASH diet and moderate exercise/walking, at least 150 mins/week      Relevant Medications   hydrochlorothiazide (HYDRODIURIL) 25 MG tablet   amLODipine (NORVASC) 5 MG tablet   Other Relevant Orders   CMP14+EGFR (Completed)   CBC with Differential/Platelet (Completed)     Respiratory   OSA (obstructive sleep apnea)    Has snoring and chronic fatigue STOP-BANG: 5 At high risk of having OSA Home sleep study ordered      Relevant Orders   Home sleep test     Other   Vitamin D deficiency   Relevant Orders   VITAMIN D 25 Hydroxy (Vit-D Deficiency, Fractures) (Completed)   Mixed hyperlipidemia    Lipid  profile reviewed Needs to follow low cholesterol diet- DASH diet material provided      Relevant Medications   hydrochlorothiazide (HYDRODIURIL) 25 MG tablet   amLODipine (NORVASC) 5 MG tablet   Other Relevant Orders   Lipid panel (Completed)   Prediabetes    Lab Results  Component Value Date   HGBA1C 5.8 (H) 04/30/2022  advised to follow DASH diet for now      Relevant Orders   Hemoglobin A1c (Completed)   Hypokalemia    Could be due to dehydration Started Klor-con for now If persistent,  will have to DC HCTZ      Right hip pain    Chronic right hip pain, likely due to OA of hip and/or bursitis Started diclofenac tablets as needed for now Referred to Orthopedic surgeon      Relevant Medications   diclofenac (VOLTAREN) 75 MG EC tablet   Other Relevant Orders   Ambulatory referral to Orthopedic Surgery   Chronic fatigue    Her fatigue appears to be likely due to her ADD and/or OSA ADD is uncontrolled despite taking Adderall She also has hypokalemia, likely due to HCTZ - needs to take Klor-Con regularly No B symptoms Currently up-to-date with mammogram and colonoscopy      Relevant Orders   TSH (Completed)   Other Visit Diagnoses     Influenza vaccine refused       Vitamin B 12 deficiency       Relevant Medications   cyanocobalamin (VITAMIN B12) injection 1,000 mcg (Completed)   Need for zoster vaccination       Relevant Orders   Varicella-zoster vaccine IM (Completed)        Meds ordered this encounter  Medications   diclofenac (VOLTAREN) 75 MG EC tablet    Sig: Take 1 tablet (75 mg total) by mouth 2 (two) times daily.    Dispense:  30 tablet    Refill:  0   hydrochlorothiazide (HYDRODIURIL) 25 MG tablet    Sig: Take 1 tablet (25 mg total) by mouth daily.    Dispense:  90 tablet    Refill:  1   amLODipine (NORVASC) 5 MG tablet    Sig: Take 1 tablet (5 mg total) by mouth daily.    Dispense:  90 tablet    Refill:  1   cyanocobalamin (VITAMIN B12) injection 1,000 mcg     Lindell Spar, MD

## 2022-05-04 NOTE — Assessment & Plan Note (Signed)
BP Readings from Last 1 Encounters:  04/30/22 139/86   Well-controlled with HCTZ to 25 mg daily and amlodipine 5 mg daily now Counseled for compliance with the medications Advised DASH diet and moderate exercise/walking, at least 150 mins/week

## 2022-05-18 ENCOUNTER — Telehealth: Payer: Self-pay | Admitting: Internal Medicine

## 2022-05-18 ENCOUNTER — Other Ambulatory Visit: Payer: Self-pay | Admitting: Internal Medicine

## 2022-05-18 DIAGNOSIS — E782 Mixed hyperlipidemia: Secondary | ICD-10-CM

## 2022-05-18 MED ORDER — ATORVASTATIN CALCIUM 10 MG PO TABS: 10.0000 mg | ORAL_TABLET | Freq: Every day | ORAL | 5 refills | Status: DC

## 2022-05-18 NOTE — Telephone Encounter (Signed)
Patient advised.

## 2022-05-18 NOTE — Telephone Encounter (Signed)
States that the crestor causes her to itch and claw herself during the night and also makes her muscles hurt to the point it hurts to raise her arms up. Wants to know if she needs to stop and or change this med

## 2022-05-22 ENCOUNTER — Ambulatory Visit (INDEPENDENT_AMBULATORY_CARE_PROVIDER_SITE_OTHER): Payer: BC Managed Care – PPO

## 2022-05-22 ENCOUNTER — Encounter: Payer: Self-pay | Admitting: Orthopedic Surgery

## 2022-05-22 ENCOUNTER — Ambulatory Visit: Payer: BC Managed Care – PPO | Admitting: Orthopedic Surgery

## 2022-05-22 VITALS — Ht 62.0 in | Wt 167.0 lb

## 2022-05-22 DIAGNOSIS — M545 Low back pain, unspecified: Secondary | ICD-10-CM

## 2022-05-22 DIAGNOSIS — M25552 Pain in left hip: Secondary | ICD-10-CM | POA: Diagnosis not present

## 2022-05-22 DIAGNOSIS — M25551 Pain in right hip: Secondary | ICD-10-CM

## 2022-05-22 DIAGNOSIS — M19011 Primary osteoarthritis, right shoulder: Secondary | ICD-10-CM | POA: Diagnosis not present

## 2022-05-22 DIAGNOSIS — G8929 Other chronic pain: Secondary | ICD-10-CM

## 2022-05-22 MED ORDER — METHYLPREDNISOLONE ACETATE 40 MG/ML IJ SUSP
40.0000 mg | Freq: Once | INTRAMUSCULAR | Status: AC
  Administered 2022-05-22: 40 mg via INTRA_ARTICULAR

## 2022-05-22 NOTE — Patient Instructions (Signed)

## 2022-05-22 NOTE — Progress Notes (Signed)
Orthopaedic Clinic Return  Assessment: [name] [name] is a 57 y.o. female with the following: Low back pain Right shoulder glenohumeral arthritis  Plan: [name] [name] has multiple complaints today.  She is complaining of pain in the lower back, radiating laterally in both hips, buttocks and some radiating tingling into the posterior left thigh.  Reviewed radiographs of her lower back, as well as for her hips, and these look pretty good overall.  There is trace anterolisthesis at L4-5, which could be contributing to some of her symptoms.  Most likely, her sedentary job is contributing to generalized weakness.  We discussed therapy versus home exercises, and she would like to try exercises on her own, as she felt better when she was working out on a more consistent basis.  She is also having right shoulder pain, and is requesting an injection today.  She had good relief from her previous injection.  This was completed in clinic today.  She will follow-up as needed.   Procedure note injection - Right shoulder    Verbal consent was obtained to inject the right shoulder, subacromial space Timeout was completed to confirm the site of injection.   The skin was prepped with alcohol and ethyl chloride was sprayed at the injection site.  A 21-gauge needle was used to inject 40 mg of Depo-Medrol and 1% lidocaine (3 cc) into the subacromial space of the right shoulder using a posterolateral approach.  There were no complications.  A sterile bandage was applied.    Meds ordered this encounter  Medications   methylPREDNISolone acetate (DEPO-MEDROL) injection 40 mg    Body mass index is 30.54 kg/m.  Follow-up: Return if symptoms worsen or fail to improve.   Subjective:  Chief Complaint  Patient presents with   Hip Pain    Bilat hip pain off an on for 2 years. Pt has a job she sits a lot but recently got a desk but it's still not helping.     History of Present Illness: [name] [name] is a 57  y.o. female who presents to clinic for evaluation of bilateral hip pain.  She states she has had progressively worsening pain in bilateral buttocks, radiating laterally and into the front of her thighs.  It is worse on the left.  She also has some associated tingling sensations in the posterior aspect of the left leg.  No specific injury.  She denies pain in her groin.  Occasionally will she will take over-the-counter medications.  She has not worked with physical therapy.  She states that she has a sedentary job, sitting for 10-12 hours/day.  She also notes that she used to be very active, and when she was active and working out, she did not have these aches and pains.  She also has complained about right shoulder pain once again, and states that the prior injection was very effective.  She is hopeful to get another injection today.  Review of Systems: No fevers or chills + tingling No chest pain No shortness of breath No bowel or bladder dysfunction No GI distress No headaches   Objective: Ht 5\' 2"  (1.575 m)   Wt 167 lb (75.8 kg)   LMP 05/21/1997   BMI 30.54 kg/m   Physical Exam:  Lower back without deformity.  Diffuse tenderness.  Tenderness within bilateral buttocks.  Mild tenderness over the greater trochanter bilaterally.  No pain in her groin, with internal and external rotation.  This movement is equal bilaterally.  She has  excellent lower body strength.  Slightly restricted right shoulder range of motion.  Good strength.  Sensation is intact distally.  IMAGING: I personally ordered and reviewed the following images:  Standing lumbar spine x-rays were obtained in clinic today.  No acute injuries are noted.  Well-maintained disc height.  Small osteophytes are appreciated throughout the lumbar spine.  Trace anterolisthesis at L4-5.  Impression: Mild lumbar degenerative changes, with trace anterolisthesis L4-5.   AP pelvis and bilateral hip x-rays were obtained in clinic today.   No acute injuries are noted.  Well-maintained hip joint space.  Small osteophytes are appreciated in the acetabulum bilaterally.  No evidence of head neck offset disease.  Impression: Bilateral hips with mild degenerative changes.   Mordecai Rasmussen, MD 05/22/2022 10:09 AM

## 2022-05-29 ENCOUNTER — Encounter: Payer: BC Managed Care – PPO | Admitting: Orthopedic Surgery

## 2022-06-04 ENCOUNTER — Other Ambulatory Visit: Payer: Self-pay | Admitting: Internal Medicine

## 2022-06-04 ENCOUNTER — Encounter: Payer: Self-pay | Admitting: Internal Medicine

## 2022-06-04 ENCOUNTER — Telehealth: Payer: Self-pay | Admitting: Internal Medicine

## 2022-06-04 ENCOUNTER — Ambulatory Visit: Payer: BC Managed Care – PPO | Admitting: Internal Medicine

## 2022-06-04 VITALS — BP 123/70 | HR 86 | Ht 62.0 in | Wt 168.6 lb

## 2022-06-04 DIAGNOSIS — R252 Cramp and spasm: Secondary | ICD-10-CM | POA: Insufficient documentation

## 2022-06-04 DIAGNOSIS — E782 Mixed hyperlipidemia: Secondary | ICD-10-CM

## 2022-06-04 DIAGNOSIS — E538 Deficiency of other specified B group vitamins: Secondary | ICD-10-CM | POA: Diagnosis not present

## 2022-06-04 DIAGNOSIS — Z23 Encounter for immunization: Secondary | ICD-10-CM | POA: Diagnosis not present

## 2022-06-04 MED ORDER — PRAVASTATIN SODIUM 10 MG PO TABS: 10.0000 mg | ORAL_TABLET | Freq: Every day | ORAL | 5 refills | Status: DC

## 2022-06-04 MED ORDER — CYANOCOBALAMIN 1000 MCG/ML IJ SOLN
1000.0000 ug | Freq: Once | INTRAMUSCULAR | Status: AC
  Administered 2022-06-04: 1000 ug via INTRAMUSCULAR

## 2022-06-04 NOTE — Assessment & Plan Note (Signed)
Due to statin Switched from Lipitor to pravastatin Advised to take co-Q10 for now

## 2022-06-04 NOTE — Assessment & Plan Note (Signed)
Lipid profile reviewed Needs to follow low cholesterol diet- DASH diet material provided Has tried Crestor and Lipitor - had myalgias Will give a trial of pravastatin If she has leg cramps with it, she can be deemed statin intolerant Advised to take co-Q10 for leg cramps

## 2022-06-04 NOTE — Telephone Encounter (Signed)
Patient called in regard to atorvastatin (LIPITOR) 10 MG tablet    States that med is causing leg cramps  Muscle pain is sides and chest pains.  Wants a call back at earliest convenience

## 2022-06-04 NOTE — Patient Instructions (Addendum)
Please stop taking Atorvastatin and start taking Pravastatin as prescribed.  Please CoQ10 200 mg once daily.

## 2022-06-04 NOTE — Progress Notes (Signed)
Acute Office Visit  Subjective:    Patient ID: [name] [name], female    DOB: [birth year], 57 y.o.   MRN: [id number]  Chief Complaint  Patient presents with   Medication Reaction    Patient Is wanting to change her cholesterol medication due to the muscle aches it is causing    HPI Patient is in today for complaint of leg cramps, muscle aches around shoulder and neck pain since starting atorvastatin.  She had similar reaction to Crestor as well.  She denies any fever or chills.  She has stopped taking her atorvastatin now and has noticed some improvement in her leg cramps.  She was placed on statin due to her uncontrolled HLD.  Past Medical History:  Diagnosis Date   Allergy    Phreesia 09/19/2020   Anxiety    Bradycardia 03/02/2015   Depression    GERD (gastroesophageal reflux disease)    History of hysterectomy    History of oophorectomy    Hx of cholecystectomy    Hypertension    IDA (iron deficiency anemia)    Migraines    Near syncope 03/02/2015   Sciatica neuralgia, right    Stroke ([place])    "light stroke 08/2015. No deficits   Tobacco abuse 03/03/2015   Tobacco abuse 03/03/2015   Vertigo     Past Surgical History:  Procedure Laterality Date   ABDOMINAL HYSTERECTOMY     BIOPSY  12/04/2016   Procedure: BIOPSY;  Surgeon: Danie Binder, MD;  Location: [facility];  Service: Endoscopy;;  gastric and duodenal biopsy   BIOPSY  07/30/2017   Procedure: BIOPSY;  Surgeon: Danie Binder, MD;  Location: [facility];  Service: Endoscopy;;  random colon bx's   CATARACT EXTRACTION W/PHACO Left 05/19/2021   Procedure: CATARACT EXTRACTION PHACO AND INTRAOCULAR LENS PLACEMENT ([place]);  Surgeon: Baruch Goldmann, MD;  Location: [facility];  Service: Ophthalmology;  Laterality: Left;  CDE: 3.35   CATARACT EXTRACTION W/PHACO Right 06/09/2021   Procedure: CATARACT EXTRACTION PHACO AND INTRAOCULAR LENS PLACEMENT ([facility]);  Surgeon: Baruch Goldmann, MD;  Location: [facility];  Service:  Ophthalmology;  Laterality: Right;  CDE: 5.99   CESAREAN SECTION     x2   CESAREAN SECTION N/A    Phreesia 09/19/2020   CHOLECYSTECTOMY     COLONOSCOPY WITH ESOPHAGOGASTRODUODENOSCOPY (EGD)  2007   Dr. Olevia Perches: normal   COLONOSCOPY WITH PROPOFOL N/A 07/30/2017   Dr. Oneida Alar: Redundant colon, external/internal hemorrhoids. Colon otherwise normal.  Random colon biopsies negative.   ESOPHAGOGASTRODUODENOSCOPY (EGD) WITH PROPOFOL N/A 12/04/2016   Dr. Oneida Alar: Gastritis but no H.pylori, small bowel bx negative. gastric polyps benign fundic gland   HEMORRHOID SURGERY      Family History  Problem Relation Age of Onset   ALS Mother    Cardiomyopathy Father    Colon cancer Neg Hx     Social History   Socioeconomic History   Marital status: Married    Spouse name: Not on file   Number of children: Not on file   Years of education: Not on file   Highest education level: Not on file  Occupational History   Not on file  Tobacco Use   Smoking status: Former    Packs/day: 0.10    Years: 20.00    Total pack years: 2.00    Types: Cigarettes    Quit date: 02/23/2021    Years since quitting: 1.2   Smokeless tobacco: Never  Vaping Use   Vaping Use: Never  used  Substance and Sexual Activity   Alcohol use: Not Currently    Comment: seldom   Drug use: Yes    Frequency: 2.0 times per week    Types: Marijuana    Comment: daily use   Sexual activity: Yes    Birth control/protection: Surgical  Other Topics Concern   Not on file  Social History Narrative   Drinks 5-6 cups of coffee daily.   Social Determinants of Health   Financial Resource Strain: Not on file  Food Insecurity: Not on file  Transportation Needs: Not on file  Physical Activity: Not on file  Stress: Not on file  Social Connections: Not on file  Intimate Partner Violence: Not on file    Outpatient Medications Prior to Visit  Medication Sig Dispense Refill   ADDERALL XR 30 MG 24 hr capsule Take 30 mg by mouth daily as  needed. For focusing while at work     albuterol (VENTOLIN HFA) 108 (90 Base) MCG/ACT inhaler TAKE 2 PUFFS BY MOUTH EVERY 6 HOURS AS NEEDED FOR WHEEZE OR SHORTNESS OF BREATH 8 g 5   ALPRAZolam (XANAX) 0.5 MG tablet Take 0.5 mg by mouth 2 (two) times daily as needed for anxiety.     amLODipine (NORVASC) 5 MG tablet Take 1 tablet (5 mg total) by mouth daily. 90 tablet 1   cetirizine (ZYRTEC) 10 MG tablet Take 1 tablet (10 mg total) by mouth daily. 90 tablet 3   cyclobenzaprine (FLEXERIL) 5 MG tablet Take 1 tablet (5 mg total) by mouth 3 (three) times daily as needed for muscle spasms. 30 tablet 1   diclofenac (VOLTAREN) 75 MG EC tablet Take 1 tablet (75 mg total) by mouth 2 (two) times daily. 30 tablet 0   escitalopram (LEXAPRO) 10 MG tablet Take 1 tablet by mouth daily.     estradiol (CLIMARA - DOSED IN MG/24 HR) 0.05 mg/24hr patch estradiol 0.05 mg/24 hr weekly transdermal patch  APPLY 1 PATCH BY TRANSDERMAL ROUTE EVERY WEEK     fluticasone (FLONASE) 50 MCG/ACT nasal spray PLACE 1 SPRAY INTO BOTH NOSTRILS 2 (TWO) TIMES DAILY 48 mL 1   hydrochlorothiazide (HYDRODIURIL) 25 MG tablet Take 1 tablet (25 mg total) by mouth daily. 90 tablet 1   lidocaine (LIDODERM) 5 % Place 1 patch onto the skin daily. Remove & Discard patch within 12 hours or as directed by MD 30 patch 0   Magnesium 400 MG TABS Take 1 tablet by mouth daily. 100 tablet 2   pantoprazole (PROTONIX) 40 MG tablet Take 1 tablet (40 mg total) by mouth daily. 90 tablet 3   potassium chloride SA (KLOR-CON M20) 20 MEQ tablet Take 1 tablet (20 mEq total) by mouth daily. 90 tablet 1   pravastatin (PRAVACHOL) 10 MG tablet Take 1 tablet (10 mg total) by mouth daily. 30 tablet 5   triamcinolone cream (KENALOG) 0.1 % Apply 1 application topically 2 (two) times daily. 30 g 0   Vitamin D, Ergocalciferol, (DRISDOL) 1.25 MG (50000 UNIT) CAPS capsule Take 1 capsule (50,000 Units total) by mouth every 7 (seven) days. 12 capsule 1   No facility-administered  medications prior to visit.    Allergies  Allergen Reactions   Sulfa Antibiotics Hives and Nausea And Vomiting    Review of Systems  Constitutional:  Negative for chills and fever.  HENT:  Negative for congestion, sinus pressure, sinus pain and sore throat.   Eyes:  Negative for discharge and redness.  Respiratory:  Negative for cough  and shortness of breath.   Cardiovascular:  Negative for chest pain and palpitations.  Gastrointestinal:  Negative for abdominal pain, diarrhea, nausea and vomiting.  Endocrine: Negative for polydipsia and polyuria.  Genitourinary:  Negative for dysuria and hematuria.  Musculoskeletal:  Positive for arthralgias (Right hip) and myalgias. Negative for neck pain and neck stiffness.  Skin:  Negative for rash.  Neurological:  Negative for dizziness and weakness.  Psychiatric/Behavioral:  Negative for agitation and behavioral problems. The patient is nervous/anxious.        Objective:    Physical Exam Vitals reviewed.  Constitutional:      General: She is not in acute distress.    Appearance: She is obese. She is not diaphoretic.  HENT:     Head: Normocephalic and atraumatic.     Nose: Nose normal.     Mouth/Throat:     Mouth: Mucous membranes are moist.  Eyes:     General: No scleral icterus.    Extraocular Movements: Extraocular movements intact.  Cardiovascular:     Rate and Rhythm: Normal rate and regular rhythm.     Pulses: Normal pulses.     Heart sounds: Normal heart sounds. No murmur heard. Pulmonary:     Breath sounds: Normal breath sounds. No wheezing or rales.  Musculoskeletal:     Cervical back: Neck supple. No tenderness.     Right lower leg: No edema.     Left lower leg: No edema.     Comments: ROM limited on the right hip due to pain  Skin:    General: Skin is warm.     Findings: Rash (Lichenied skin around neck area) present.  Neurological:     General: No focal deficit present.     Mental Status: She is alert and  oriented to person, place, and time.  Psychiatric:        Mood and Affect: Mood normal.        Behavior: Behavior normal.     BP 123/70 (BP Location: Left Arm, Patient Position: Sitting, Cuff Size: Normal)   Pulse 86   Ht 5\' 2"  (1.575 m)   Wt 168 lb 9.6 oz (76.5 kg)   LMP 05/21/1997   SpO2 97%   BMI 30.84 kg/m  Wt Readings from Last 3 Encounters:  06/04/22 168 lb 9.6 oz (76.5 kg)  05/22/22 167 lb (75.8 kg)  04/30/22 167 lb (75.8 kg)        Assessment & Plan:   Problem List Items Addressed This Visit       Other   Mixed hyperlipidemia - Primary    Lipid profile reviewed Needs to follow low cholesterol diet- DASH diet material provided Has tried Crestor and Lipitor - had myalgias Will give a trial of pravastatin If she has leg cramps with it, she can be deemed statin intolerant Advised to take co-Q10 for leg cramps      Leg cramps    Due to statin Switched from Lipitor to pravastatin Advised to take co-Q10 for now      Other Visit Diagnoses     Need for immunization against influenza       Relevant Orders   Flu Vaccine QUAD 55mo+IM (Fluarix, Fluzone & Alfiuria Quad PF) (Completed)   Vitamin B 12 deficiency       Relevant Medications   cyanocobalamin (VITAMIN B12) injection 1,000 mcg (Completed)        Meds ordered this encounter  Medications   cyanocobalamin (VITAMIN B12) injection 1,000 mcg  Kailene Steinhart K Caelie R[place], MD 

## 2022-06-15 ENCOUNTER — Encounter: Payer: Self-pay | Admitting: Orthopedic Surgery

## 2022-06-15 ENCOUNTER — Ambulatory Visit (INDEPENDENT_AMBULATORY_CARE_PROVIDER_SITE_OTHER): Payer: BC Managed Care – PPO

## 2022-06-15 ENCOUNTER — Ambulatory Visit: Payer: BC Managed Care – PPO | Admitting: Orthopedic Surgery

## 2022-06-15 VITALS — BP 143/68 | HR 72 | Ht 62.0 in | Wt 170.0 lb

## 2022-06-15 DIAGNOSIS — G8929 Other chronic pain: Secondary | ICD-10-CM

## 2022-06-15 DIAGNOSIS — M25512 Pain in left shoulder: Secondary | ICD-10-CM | POA: Diagnosis not present

## 2022-06-15 NOTE — Progress Notes (Signed)
Orthopaedic Clinic Return  Assessment: [name] [name] is a 57 y.o. female with the following:  Plan: Mrs. Scheff has pain in the left shoulder.  No specific injury.  Strength and range of motion are good.  Radiographs demonstrate glenohumeral arthritis.  She has had an excellent response with an injection of the right shoulder.  She would like to proceed with an injection in the left shoulder today.  This was completed without issues.  She will contact the clinic if she has any further issues.  Procedure note injection Left shoulder    Verbal consent was obtained to inject the left shoulder, subacromial space Timeout was completed to confirm the site of injection.  The skin was prepped with alcohol and ethyl chloride was sprayed at the injection site.  A 21-gauge needle was used to inject 40 mg of Depo-Medrol and 1% lidocaine (3 cc) into the subacromial space of the left shoulder using a posterolateral approach.  There were no complications. A sterile bandage was applied.   Follow-up: Return if symptoms worsen or fail to improve.   Subjective:  Chief Complaint  Patient presents with   Shoulder Pain    Lt shoulder pain for a while but getting worse.     History of Present Illness: [name] [name] is a 57 y.o. female who returns to clinic for evaluation of her left shoulder.  I have seen her previously in clinic for other complaints.  She states that she has had pain in the left shoulder for years, and it is progressively getting worse.  No specific injury.  She has some difficulty with motion.  She describes the pain as being deep within the left shoulder.  It does restrict her motion, as well as her function.  She has previously had an injection in the right shoulder, and had an excellent response.  Review of Systems: No fevers or chills No numbness or tingling No chest pain No shortness of breath No bowel or bladder dysfunction No GI distress No headaches   Objective: BP (!)  143/68   Pulse 72   Ht 5\' 2"  (1.575 m)   Wt 170 lb (77.1 kg)   LMP 05/21/1997   BMI 31.09 kg/m   Physical Exam:  Alert and oriented.  No acute distress.  Left shoulder without deformity.  No swelling.  No bruising.  Diffuse tenderness to palpation.  Passive forward flexion to 150 degrees.  Abduction to 90 degrees.  External rotation is consistent with contralateral side.  Positive Jobe's.  4+/5 strength in the supraspinatus and infraspinatus.  Negative belly press.  IMAGING: I personally ordered and reviewed the following images:  X-rays left shoulder obtained in clinic today.  No acute injuries are noted.  Loss of joint space within the glenohumeral joint.  There are associated inferior osteophytes.  No evidence of proximal humeral migration.  Minimal degenerative changes at the [facility] joint.  No bony lesions.  Impression: Moderate left glenohumeral joint arthritis   Mordecai Rasmussen, MD 06/15/2022 8:53 AM

## 2022-06-15 NOTE — Patient Instructions (Signed)

## 2022-07-10 ENCOUNTER — Ambulatory Visit: Payer: BC Managed Care – PPO | Admitting: Orthopedic Surgery

## 2022-07-10 ENCOUNTER — Encounter: Payer: Self-pay | Admitting: Orthopedic Surgery

## 2022-07-10 VITALS — Ht 62.0 in | Wt 170.0 lb

## 2022-07-10 DIAGNOSIS — M19012 Primary osteoarthritis, left shoulder: Secondary | ICD-10-CM | POA: Diagnosis not present

## 2022-07-10 MED ORDER — CYCLOBENZAPRINE HCL 10 MG PO TABS: 10.0000 mg | ORAL_TABLET | Freq: Two times a day (BID) | ORAL | 0 refills | Status: DC | PRN

## 2022-07-10 NOTE — Patient Instructions (Signed)

## 2022-07-10 NOTE — Progress Notes (Signed)
Orthopaedic Clinic Return  Assessment: [name] [name] is a 57 y.o. female with the following:  Plan: [name] continues to have pain in the left shoulder.  She states that the prior injection helped for just a few days.  She continues to have diffuse pain.  We reviewed the radiographs in clinic again today, which demonstrates loss of joint space, as well as an inferior humeral head osteophyte.  It is plausible that her current symptoms are related to the arthritis.  We discussed proceeding with an ultrasound-guided injection of the left glenohumeral joint, and she elected to proceed.  If she has further issues, she will contact the clinic.   Procedure note injection - Left shoulder, ultrasound guidance   Verbal consent was obtained to inject the Left shoulder, glenohumeral joint  Timeout was completed to confirm the site of injection.   Using the ultrasound, the rotator cuff tendons were identified.  The joint space was also identified. The skin was prepped with alcohol and ethyl chloride was sprayed at the injection site.  A 21-gauge needle was used to inject 40 mg of Depo-Medrol and 1% lidocaine (4 cc) into the glenohumeral joint space of the Left shoulder using a posterolateral approach.  The needle was visualized entering the glenohumeral joint, and the medication was also visualized. There were no complications.  A sterile bandage was applied.   Note: In order to accurately identify the placement of the needle, ultrasound was required, to increase the accuracy, and specificity of the injection.    Follow-up: Return if symptoms worsen or fail to improve.   Subjective:  Chief Complaint  Patient presents with   Shoulder Pain    Lt shoulder has an are where she got her injection. Also states she didn't get any relief from the injection and it's feeling worse now.    History of Present Illness: [name] [name] is a 57 y.o. female who returns to clinic for evaluation of her left  shoulder.  I saw her in clinic about a month ago.  At that time, she had a subacromial injection of the left shoulder.  She notes limited improvement for short period of time.  Since then, her pain is gradually worsened.  She states it is severe at this point.  She has tried to increase her level of activity, but she is at a point now where she is not moving her shoulder due to pain.   Review of Systems: No fevers or chills No numbness or tingling No chest pain No shortness of breath No bowel or bladder dysfunction No GI distress No headaches   Objective: Ht 5' 2"$  (1.575 m)   Wt 170 lb (77.1 kg)   LMP 05/21/1997   BMI 31.09 kg/m   Physical Exam:  Alert and oriented.  No acute distress.  Left shoulder without deformity.  No swelling.  No bruising.  Diffuse tenderness to palpation.  Passive forward flexion to 110 degrees.  Abduction to 90 degrees.  External rotation is consistent with contralateral side.  Positive Jobe's.  4+/5 strength in the supraspinatus and infraspinatus.  Negative belly press.  IMAGING: I personally ordered and reviewed the following images:  No new x-rays obtained today.  [name] Rasmussen, MD 07/10/2022 10:19 AM

## 2022-07-11 ENCOUNTER — Telehealth: Payer: Self-pay | Admitting: Orthopedic Surgery

## 2022-07-11 ENCOUNTER — Encounter: Payer: Self-pay | Admitting: Orthopedic Surgery

## 2022-07-11 NOTE — Telephone Encounter (Signed)
Patient called and wants Dr..Cairns or his assistant to call her back she has a question about the injection she got yesterday.   Please call her back at [phone]

## 2022-07-12 NOTE — Telephone Encounter (Signed)
Spoke with pt who states she recently purchased weight loss treatment, and due to her shoulders hurting she's been unable to complete it. Would like to know if you would be willing to give her a letter saying she's under your care, and has received injections? Please advise.

## 2022-07-13 NOTE — Telephone Encounter (Signed)
Letter sent with information given.

## 2022-07-14 ENCOUNTER — Other Ambulatory Visit: Payer: Self-pay | Admitting: Internal Medicine

## 2022-07-14 DIAGNOSIS — J3089 Other allergic rhinitis: Secondary | ICD-10-CM

## 2022-07-19 ENCOUNTER — Other Ambulatory Visit: Payer: Self-pay | Admitting: Internal Medicine

## 2022-07-19 ENCOUNTER — Encounter: Payer: Self-pay | Admitting: Radiology

## 2022-07-19 DIAGNOSIS — Z1231 Encounter for screening mammogram for malignant neoplasm of breast: Secondary | ICD-10-CM

## 2022-08-01 ENCOUNTER — Encounter: Payer: Self-pay | Admitting: Orthopedic Surgery

## 2022-08-01 ENCOUNTER — Ambulatory Visit: Payer: BC Managed Care – PPO | Admitting: Orthopedic Surgery

## 2022-08-01 VITALS — BP 137/80 | HR 76 | Ht 62.0 in | Wt 170.0 lb

## 2022-08-01 DIAGNOSIS — M19012 Primary osteoarthritis, left shoulder: Secondary | ICD-10-CM | POA: Diagnosis not present

## 2022-08-01 MED ORDER — DICLOFENAC SODIUM 50 MG PO TBEC: 50.0000 mg | DELAYED_RELEASE_TABLET | Freq: Two times a day (BID) | ORAL | 0 refills | Status: DC

## 2022-08-01 MED ORDER — PREDNISONE 10 MG (21) PO TBPK: ORAL_TABLET | ORAL | 0 refills | Status: DC

## 2022-08-01 NOTE — Patient Instructions (Signed)
Take to prednisone to completion  Once done with the prednisone, can start taking the diclofenac

## 2022-08-01 NOTE — Progress Notes (Signed)
Orthopaedic Clinic Return  Assessment: [name] [name] is a 57 y.o. female with the following:  Plan: [name] [name] continues to have pain in the left shoulder.  Injections have not helped with her symptoms.  Unfortunately, her shoulder is very aggravated.  I am recommending a course of prednisone, followed by diclofenac.  We also briefly discussed shoulder replacement.  Pamphlet was provided.  Continue to use the shoulder as much as possible.  She will let me know how she is doing, and how she wishes to proceed.   Follow-up: Return if symptoms worsen or fail to improve.   Subjective:  Chief Complaint  Patient presents with   Shoulder Pain    Left     History of Present Illness: [name] [name] is a 57 y.o. female who returns to clinic for evaluation of her left shoulder.  She has progressive worsening left shoulder pain.  She has had injections, with limited improvement in her symptoms.  Right now, her pain is severe.  She has limited motion.  She is taking ibuprofen on a daily basis.  Review of Systems: No fevers or chills No numbness or tingling No chest pain No shortness of breath No bowel or bladder dysfunction No GI distress No headaches   Objective: BP 137/80   Pulse 76   Ht '5\' 2"'$  (1.575 m)   Wt 170 lb (77.1 kg)   LMP 05/21/1997   BMI 31.09 kg/m   Physical Exam:  Alert and oriented.  No acute distress.  Left shoulder without deformity.  No swelling.  No bruising.  Diffuse tenderness to palpation.  Passive forward flexion to 110 degrees.  Abduction to 90 degrees.  External rotation is consistent with contralateral side.  Positive Jobe's.  4+/5 strength in the supraspinatus and infraspinatus.  Negative belly press.  IMAGING: I personally ordered and reviewed the following images:  No new x-rays obtained today.  [name] Rasmussen, MD 08/01/2022 9:18 AM

## 2022-08-21 ENCOUNTER — Emergency Department (HOSPITAL_COMMUNITY): Payer: BC Managed Care – PPO

## 2022-08-21 ENCOUNTER — Encounter: Payer: Self-pay | Admitting: Internal Medicine

## 2022-08-21 ENCOUNTER — Encounter (HOSPITAL_COMMUNITY): Payer: Self-pay

## 2022-08-21 ENCOUNTER — Emergency Department (HOSPITAL_COMMUNITY)
Admission: EM | Admit: 2022-08-21 | Discharge: 2022-08-21 | Disposition: A | Discharge status: Home / Self Care | Payer: BC Managed Care – PPO | Attending: Emergency Medicine | Admitting: Emergency Medicine

## 2022-08-21 ENCOUNTER — Other Ambulatory Visit: Payer: Self-pay

## 2022-08-21 ENCOUNTER — Ambulatory Visit: Payer: BC Managed Care – PPO | Admitting: Internal Medicine

## 2022-08-21 VITALS — BP 158/88 | HR 94 | Ht 62.0 in | Wt 179.4 lb

## 2022-08-21 DIAGNOSIS — Z87891 Personal history of nicotine dependence: Secondary | ICD-10-CM | POA: Insufficient documentation

## 2022-08-21 DIAGNOSIS — J3089 Other allergic rhinitis: Secondary | ICD-10-CM | POA: Diagnosis not present

## 2022-08-21 DIAGNOSIS — M5136 Other intervertebral disc degeneration, lumbar region: Secondary | ICD-10-CM

## 2022-08-21 DIAGNOSIS — R079 Chest pain, unspecified: Secondary | ICD-10-CM | POA: Insufficient documentation

## 2022-08-21 DIAGNOSIS — I10 Essential (primary) hypertension: Secondary | ICD-10-CM | POA: Diagnosis not present

## 2022-08-21 DIAGNOSIS — Z8673 Personal history of transient ischemic attack (TIA), and cerebral infarction without residual deficits: Secondary | ICD-10-CM | POA: Diagnosis not present

## 2022-08-21 DIAGNOSIS — M160 Bilateral primary osteoarthritis of hip: Secondary | ICD-10-CM

## 2022-08-21 DIAGNOSIS — R002 Palpitations: Secondary | ICD-10-CM | POA: Insufficient documentation

## 2022-08-21 DIAGNOSIS — Z79899 Other long term (current) drug therapy: Secondary | ICD-10-CM | POA: Diagnosis not present

## 2022-08-21 DIAGNOSIS — R0789 Other chest pain: Secondary | ICD-10-CM | POA: Diagnosis not present

## 2022-08-21 LAB — BASIC METABOLIC PANEL
Anion gap: 8 (ref 5–15)
BUN: 11 mg/dL (ref 6–20)
CO2: 29 mmol/L (ref 22–32)
Calcium: 8.8 mg/dL — ABNORMAL LOW (ref 8.9–10.3)
Chloride: 102 mmol/L (ref 98–111)
Creatinine, Ser: 0.89 mg/dL (ref 0.44–1.00)
GFR, Estimated: >60 mL/min (ref >60)
Glucose, Bld: 146 mg/dL — ABNORMAL HIGH (ref 70–99)
Potassium: 3 mmol/L — ABNORMAL LOW (ref 3.5–5.1)
Sodium: 139 mmol/L (ref 135–145)

## 2022-08-21 LAB — CBC
HCT: 39.9 % (ref 36.0–46.0)
Hemoglobin: 12.8 g/dL (ref 12.0–15.0)
MCH: 30.4 pg (ref 26.0–34.0)
MCHC: 32.1 g/dL (ref 30.0–36.0)
MCV: 94.8 fL (ref 80.0–100.0)
Platelets: 190 10*3/uL (ref 150–400)
RBC: 4.21 MIL/uL (ref 3.87–5.11)
RDW: 13.6 % (ref 11.5–15.5)
WBC: 6.8 10*3/uL (ref 4.0–10.5)
nRBC: 0 % (ref 0.0–0.2)

## 2022-08-21 LAB — TROPONIN I (HIGH SENSITIVITY)
Troponin I (High Sensitivity): 4 ng/L (ref <18)
Troponin I (High Sensitivity): 4 ng/L (ref <18)

## 2022-08-21 LAB — D-DIMER, QUANTITATIVE: D-Dimer, Quant: 0.47 ug/mL-FEU (ref 0.00–0.50)

## 2022-08-21 MED ORDER — AMLODIPINE BESYLATE 10 MG PO TABS: 10.0000 mg | ORAL_TABLET | Freq: Every day | ORAL | 1 refills | Status: DC

## 2022-08-21 MED ORDER — TRAMADOL HCL 50 MG PO TABS: 50.0000 mg | ORAL_TABLET | Freq: Three times a day (TID) | ORAL | 0 refills | Status: AC | PRN

## 2022-08-21 MED ORDER — CYCLOBENZAPRINE HCL 10 MG PO TABS: 10.0000 mg | ORAL_TABLET | Freq: Two times a day (BID) | ORAL | 1 refills | Status: DC | PRN

## 2022-08-21 MED ORDER — FLUTICASONE PROPIONATE 50 MCG/ACT NA SUSP: 1.0000 | Freq: Two times a day (BID) | NASAL | 1 refills | Status: DC

## 2022-08-21 NOTE — Patient Instructions (Addendum)
Please start taking Amlodipine 10 mg once daily instead of 5 mg.  Please take Diclofenac as needed for mild or moderate pain. Please take Tramadol as needed for severe pain. Please do not take Tramadol at the same time as Xanax.  Please avoid heavy lifting or frequent bending.

## 2022-08-21 NOTE — Assessment & Plan Note (Signed)
BP Readings from Last 1 Encounters:  08/21/22 (!) 158/88   Uncontrolled with HCTZ to 25 mg QD and amlodipine 5 mg QD Likely elevated due to pain, but considering her chronic pain, have to adjust her antihypertensive regimen Increased dose of amlodipine to 10 mg QD Counseled for compliance with the medications Advised DASH diet and moderate exercise/walking, at least 150 mins/week

## 2022-08-21 NOTE — Assessment & Plan Note (Addendum)
Chronic right > left hip pain, likely due to OA of hip and/or bursitis Has diclofenac tablets as needed for now Flexeril as needed for back muscle spasms Avoid oral steroids as she has had fatigue and weight gain with it Referred to Orthopedic surgeon

## 2022-08-21 NOTE — ED Notes (Signed)
ED Provider at bedside. 

## 2022-08-21 NOTE — Assessment & Plan Note (Signed)
Continue Zyrtec and Flonase.
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
## 2022-08-21 NOTE — Discharge Instructions (Signed)
We evaluated you in the emergency department for your chest pain.  Your cardiac enzymes were negative.  We also obtained a test looking for blood clot in your lungs (pulmonary embolism).  This test was also negative.  We did not find a dangerous cause of your chest pain, but given your age and your other medical problems such as high blood pressure and high cholesterol, we would like you to follow-up with a cardiologist for further testing.  They may recommend a stress test.  We have placed a referral and they should call you within the next 24 to 48 hours.  Please also follow-up with your primary doctor.  Please return to the emergency department if you develop any new or worsening symptoms such as difficulty breathing, severe pain, lightheadedness or fainting, vomiting, or any other concerning symptoms.

## 2022-08-21 NOTE — ED Triage Notes (Signed)
Pt presents with palpitations and CP with radiation to L shoulder, elevated HR and B/P. Pt was sent by PCP. Pt states she has had HA, fatigued since last week. Pt denies ShOB.

## 2022-08-21 NOTE — Progress Notes (Signed)
Acute Office Visit  Subjective:    Patient ID: [name] [name], female    DOB: Jul 27, 1965, 57 y.o.   MRN: [id number]  Chief Complaint  Patient presents with   Leg Pain    Patient has been on steroids due to her shoulder, since coming off the medication her left leg has been in pain, having difficulties walking.    HPI Patient is in today for complaint of acute on chronic left lower back and hip pain.  She has worsening of her lower back and hip pain since 04/01, and is unable to walk without pain.  She has worsening of pain upon standing up.  Her pain is constant, 8-10/10 and radiating towards lateral side of the left thigh.  She has had x-ray of lumbar spine and hip, which showed mild degenerative changes.  She recently completed oral prednisone, prescribed by orthopedic surgeon.  She also takes diclofenac for hip pain.  Denies any recent injury or fall.  Denies any numbness or tingling of the LE.  Her blood pressure has been elevated recently, up to 160s/90s at her workplace, and she attributes it to pain.  She denies any headache, dizziness, chest pain, dyspnea or palpitations.  Past Medical History:  Diagnosis Date   Allergy    Phreesia 09/19/2020   Anxiety    Bradycardia 03/02/2015   Depression    GERD (gastroesophageal reflux disease)    History of hysterectomy    History of oophorectomy    Hx of cholecystectomy    Hypertension    IDA (iron deficiency anemia)    Migraines    Near syncope 03/02/2015   Sciatica neuralgia, right    Stroke    "light stroke 08/2015. No deficits   Tobacco abuse 03/03/2015   Tobacco abuse 03/03/2015   Vertigo     Past Surgical History:  Procedure Laterality Date   ABDOMINAL HYSTERECTOMY     BIOPSY  12/04/2016   Procedure: BIOPSY;  Surgeon: Danie Binder, MD;  Location: [facility];  Service: Endoscopy;;  gastric and duodenal biopsy   BIOPSY  07/30/2017   Procedure: BIOPSY;  Surgeon: Danie Binder, MD;  Location: [facility];   Service: Endoscopy;;  random colon bx's   CATARACT EXTRACTION W/PHACO Left 05/19/2021   Procedure: CATARACT EXTRACTION PHACO AND INTRAOCULAR LENS PLACEMENT ([place]);  Surgeon: Baruch Goldmann, MD;  Location: [facility];  Service: Ophthalmology;  Laterality: Left;  CDE: 3.35   CATARACT EXTRACTION W/PHACO Right 06/09/2021   Procedure: CATARACT EXTRACTION PHACO AND INTRAOCULAR LENS PLACEMENT ([facility]);  Surgeon: Baruch Goldmann, MD;  Location: [facility];  Service: Ophthalmology;  Laterality: Right;  CDE: 5.99   CESAREAN SECTION     x2   CESAREAN SECTION N/A    Phreesia 09/19/2020   CHOLECYSTECTOMY     COLONOSCOPY WITH ESOPHAGOGASTRODUODENOSCOPY ([facility]GD)  2007   Dr. Olevia Perches: normal   COLONOSCOPY WITH PROPOFOL N/A 07/30/2017   Dr. Oneida Alar: Redundant colon, external/internal hemorrhoids. Colon otherwise normal.  Random colon biopsies negative.   ESOPHAGOGASTRODUODENOSCOPY (EGD) WITH PROPOFOL N/A 12/04/2016   Dr. Oneida Alar: Gastritis but no H.pylori, small bowel bx negative. gastric polyps benign fundic gland   HEMORRHOID SURGERY      Family History  Problem Relation Age of Onset   ALS Mother    Cardiomyopathy Father    Colon cancer Neg Hx     Social History   Socioeconomic History   Marital status: Married    Spouse name: Not on file   Number of  children: Not on file   Years of education: Not on file   Highest education level: Not on file  Occupational History   Not on file  Tobacco Use   Smoking status: Former    Packs/day: 0.10    Years: 20.00    Additional pack years: 0.00    Total pack years: 2.00    Types: Cigarettes    Quit date: 02/23/2021    Years since quitting: 1.4   Smokeless tobacco: Never  Vaping Use   Vaping Use: Never used  Substance and Sexual Activity   Alcohol use: Not Currently    Comment: seldom   Drug use: Yes    Frequency: 2.0 times per week    Types: Marijuana    Comment: daily use   Sexual activity: Yes    Birth control/protection: Surgical  Other Topics Concern   Not  on file  Social History Narrative   Drinks 5-6 cups of coffee daily.   Social Determinants of Health   Financial Resource Strain: Not on file  Food Insecurity: Not on file  Transportation Needs: Not on file  Physical Activity: Not on file  Stress: Not on file  Social Connections: Not on file  Intimate Partner Violence: Not on file    Outpatient Medications Prior to Visit  Medication Sig Dispense Refill   ADDERALL XR 30 MG 24 hr capsule Take 30 mg by mouth daily as needed. For focusing while at work     albuterol (VENTOLIN HFA) 108 (90 Base) MCG/ACT inhaler TAKE 2 PUFFS BY MOUTH EVERY 6 HOURS AS NEEDED FOR WHEEZE OR SHORTNESS OF BREATH 8 g 5   ALPRAZolam (XANAX) 0.5 MG tablet Take 0.5 mg by mouth 2 (two) times daily as needed for anxiety.     cetirizine (ZYRTEC) 10 MG tablet TAKE 1 TABLET BY MOUTH EVERY DAY 90 tablet 3   diclofenac (VOLTAREN) 50 MG EC tablet Take 1 tablet (50 mg total) by mouth 2 (two) times daily. 60 tablet 0   escitalopram (LEXAPRO) 10 MG tablet Take 1 tablet by mouth daily.     estradiol (CLIMARA - DOSED IN MG/24 HR) 0.05 mg/24hr patch estradiol 0.05 mg/24 hr weekly transdermal patch  APPLY 1 PATCH BY TRANSDERMAL ROUTE EVERY WEEK     hydrochlorothiazide (HYDRODIURIL) 25 MG tablet Take 1 tablet (25 mg total) by mouth daily. 90 tablet 1   lidocaine (LIDODERM) 5 % Place 1 patch onto the skin daily. Remove & Discard patch within 12 hours or as directed by MD 30 patch 0   Lorcaserin HCl (BELVIQ PO) Take 1 tablet by mouth 2 (two) times daily.     Magnesium 400 MG TABS Take 1 tablet by mouth daily. 100 tablet 2   pantoprazole (PROTONIX) 40 MG tablet Take 1 tablet (40 mg total) by mouth daily. 90 tablet 3   potassium chloride SA (KLOR-CON M20) 20 MEQ tablet Take 1 tablet (20 mEq total) by mouth daily. 90 tablet 1   pravastatin (PRAVACHOL) 10 MG tablet Take 1 tablet (10 mg total) by mouth daily. 30 tablet 5   triamcinolone cream (KENALOG) 0.1 % Apply 1 application topically  2 (two) times daily. 30 g 0   Vitamin D, Ergocalciferol, (DRISDOL) 1.25 MG (50000 UNIT) CAPS capsule Take 1 capsule (50,000 Units total) by mouth every 7 (seven) days. 12 capsule 1   amLODipine (NORVASC) 5 MG tablet Take 1 tablet (5 mg total) by mouth daily. 90 tablet 1   cyclobenzaprine (FLEXERIL) 10 MG tablet Take  1 tablet (10 mg total) by mouth 2 (two) times daily as needed. 20 tablet 0   fluticasone (FLONASE) 50 MCG/ACT nasal spray PLACE 1 SPRAY INTO BOTH NOSTRILS 2 (TWO) TIMES DAILY 48 mL 1   predniSONE (STERAPRED UNI-PAK 21 TAB) 10 MG (21) TBPK tablet 10 mg DS 12 as directed 48 tablet 0   No facility-administered medications prior to visit.    Allergies  Allergen Reactions   Sulfa Antibiotics Hives and Nausea And Vomiting    Review of Systems  Constitutional:  Negative for chills and fever.  HENT:  Negative for congestion, sinus pressure, sinus pain and sore throat.   Eyes:  Negative for discharge and redness.  Respiratory:  Negative for cough and shortness of breath.   Cardiovascular:  Negative for chest pain and palpitations.  Gastrointestinal:  Negative for abdominal pain, diarrhea, nausea and vomiting.  Endocrine: Negative for polydipsia and polyuria.  Genitourinary:  Negative for dysuria and hematuria.  Musculoskeletal:  Positive for arthralgias (B/l hip), back pain and myalgias. Negative for neck pain and neck stiffness.  Skin:  Negative for rash.  Neurological:  Negative for dizziness and weakness.  Psychiatric/Behavioral:  Negative for agitation and behavioral problems. The patient is nervous/anxious.        Objective:    Physical Exam Vitals reviewed.  Constitutional:      General: She is not in acute distress.    Appearance: She is obese. She is not diaphoretic.  HENT:     Head: Normocephalic and atraumatic.     Nose: Nose normal.     Mouth/Throat:     Mouth: Mucous membranes are moist.  Eyes:     General: No scleral icterus.    Extraocular Movements:  Extraocular movements intact.  Cardiovascular:     Rate and Rhythm: Normal rate and regular rhythm.     Pulses: Normal pulses.     Heart sounds: Normal heart sounds. No murmur heard. Pulmonary:     Breath sounds: Normal breath sounds. No wheezing or rales.  Musculoskeletal:     Cervical back: Neck supple. No tenderness.     Lumbar back: Tenderness present. Decreased range of motion.     Right lower leg: No edema.     Left lower leg: No edema.     Comments: ROM limited on the left hip due to pain  Skin:    General: Skin is warm.     Findings: Rash (Lichenied skin around neck area) present.  Neurological:     General: No focal deficit present.     Mental Status: She is alert and oriented to person, place, and time.  Psychiatric:        Mood and Affect: Mood normal.        Behavior: Behavior normal.     BP (!) 158/88 (BP Location: Right Arm, Cuff Size: Normal)   Pulse 94   Ht 5\' 2"  (1.575 m)   Wt 179 lb 6.4 oz (81.4 kg)   LMP 05/21/1997   SpO2 97%   BMI 32.81 kg/m  Wt Readings from Last 3 Encounters:  08/21/22 179 lb 6.4 oz (81.4 kg)  08/01/22 170 lb (77.1 kg)  07/10/22 170 lb (77.1 kg)        Assessment & Plan:   Problem List Items Addressed This Visit       Cardiovascular and Mediastinum   Essential hypertension    BP Readings from Last 1 Encounters:  08/21/22 (!) 158/88  Uncontrolled with HCTZ to 25 mg  QD and amlodipine 5 mg QD Likely elevated due to pain, but considering her chronic pain, have to adjust her antihypertensive regimen Increased dose of amlodipine to 10 mg QD Counseled for compliance with the medications Advised DASH diet and moderate exercise/walking, at least 150 mins/week      Relevant Medications   amLODipine (NORVASC) 10 MG tablet     Respiratory   Allergic rhinitis    Continue Zyrtec and Flonase      Relevant Medications   fluticasone (FLONASE) 50 MCG/ACT nasal spray     Musculoskeletal and Integument   OA (osteoarthritis) of  hip - Primary    Chronic right > left hip pain, likely due to OA of hip and/or bursitis Has diclofenac tablets as needed for now Flexeril as needed for back muscle spasms Avoid oral steroids as she has had fatigue and weight gain with it Referred to Orthopedic surgeon      Relevant Medications   cyclobenzaprine (FLEXERIL) 10 MG tablet   traMADol (ULTRAM) 50 MG tablet   DDD (degenerative disc disease), lumbar    X-ray of lumbar spine reviewed, showed mild degenerative changes Although her pain is out of proportion to her degenerative changes Continue diclofenac as needed for pain Tramadol as needed for severe pain for short-term Avoid heavy lifting and frequent bending, work note provided Simple back exercises advised      Relevant Medications   cyclobenzaprine (FLEXERIL) 10 MG tablet   traMADol (ULTRAM) 50 MG tablet     Meds ordered this encounter  Medications   cyclobenzaprine (FLEXERIL) 10 MG tablet    Sig: Take 1 tablet (10 mg total) by mouth 2 (two) times daily as needed.    Dispense:  30 tablet    Refill:  1   traMADol (ULTRAM) 50 MG tablet    Sig: Take 1 tablet (50 mg total) by mouth every 8 (eight) hours as needed for up to 5 days.    Dispense:  15 tablet    Refill:  0   fluticasone (FLONASE) 50 MCG/ACT nasal spray    Sig: Place 1 spray into both nostrils 2 (two) times daily.    Dispense:  48 mL    Refill:  1    DX Code Needed  .   amLODipine (NORVASC) 10 MG tablet    Sig: Take 1 tablet (10 mg total) by mouth daily.    Dispense:  90 tablet    Refill:  1     Gladis Soley Keith Rake, MD

## 2022-08-21 NOTE — Assessment & Plan Note (Signed)
X-ray of lumbar spine reviewed, showed mild degenerative changes Although her pain is out of proportion to her degenerative changes Continue diclofenac as needed for pain Tramadol as needed for severe pain for short-term Avoid heavy lifting and frequent bending, work note provided Simple back exercises advised

## 2022-08-22 ENCOUNTER — Telehealth: Payer: Self-pay | Admitting: Internal Medicine

## 2022-08-22 NOTE — Telephone Encounter (Signed)
Spoke to patient

## 2022-08-22 NOTE — Telephone Encounter (Signed)
Pt called wanting to speak to nurse about updating her O[facility] note??

## 2022-08-27 ENCOUNTER — Encounter: Payer: Self-pay | Admitting: Internal Medicine

## 2022-09-03 ENCOUNTER — Ambulatory Visit: Payer: BC Managed Care – PPO | Admitting: Internal Medicine

## 2022-09-03 ENCOUNTER — Encounter: Payer: Self-pay | Admitting: Internal Medicine

## 2022-09-03 VITALS — BP 148/82 | HR 109 | Ht 62.0 in | Wt 177.4 lb

## 2022-09-03 DIAGNOSIS — F419 Anxiety disorder, unspecified: Secondary | ICD-10-CM

## 2022-09-03 DIAGNOSIS — F988 Other specified behavioral and emotional disorders with onset usually occurring in childhood and adolescence: Secondary | ICD-10-CM | POA: Diagnosis not present

## 2022-09-03 DIAGNOSIS — E782 Mixed hyperlipidemia: Secondary | ICD-10-CM | POA: Diagnosis not present

## 2022-09-03 DIAGNOSIS — I10 Essential (primary) hypertension: Secondary | ICD-10-CM

## 2022-09-03 DIAGNOSIS — Z23 Encounter for immunization: Secondary | ICD-10-CM | POA: Diagnosis not present

## 2022-09-03 DIAGNOSIS — E538 Deficiency of other specified B group vitamins: Secondary | ICD-10-CM

## 2022-09-03 DIAGNOSIS — M160 Bilateral primary osteoarthritis of hip: Secondary | ICD-10-CM

## 2022-09-03 MED ORDER — MISC. DEVICES MISC
0 refills | Status: DC
Start: 2022-09-03 — End: 2023-11-28

## 2022-09-03 MED ORDER — CYANOCOBALAMIN 1000 MCG/ML IJ SOLN
1000.0000 ug | Freq: Once | INTRAMUSCULAR | Status: AC
Start: 2022-09-03 — End: 2022-09-03
  Administered 2022-09-03: 1000 ug via INTRAMUSCULAR

## 2022-09-03 MED ORDER — CHLORTHALIDONE 25 MG PO TABS
25.0000 mg | ORAL_TABLET | Freq: Every day | ORAL | 1 refills | Status: DC
Start: 2022-09-03 — End: 2023-01-03

## 2022-09-03 NOTE — Progress Notes (Unsigned)
Established Patient Office Visit  Subjective:  Patient ID: [name] [name], female    DOB: May 05, 1966  Age: 57 y.o. MRN: [id number]  CC:  Chief Complaint  Patient presents with   Hypertension    Four month follow up    HPI [name] [name] is a 57 y.o. female with past medical history of HTN,  GERD/gastritis, ADD with anxiety and obesity who presents for f/u of her chronic medical conditions.  HTN: BP is well-controlled. Takes medications regularly. Patient denies headache, dizziness, chest pain, dyspnea or palpitations.  She follows up with Psychiatry for ADD and anxiety. Denies anhedonia, SI or HI.   She has been doing diet modification and exercises regularly.  She has not been able to lose any weight from last 4 months. She is interested in medical weight loss.   Past Medical History:  Diagnosis Date   Allergy    Phreesia 09/19/2020   Anxiety    Bradycardia 03/02/2015   Depression    GERD (gastroesophageal reflux disease)    History of hysterectomy    History of oophorectomy    Hx of cholecystectomy    Hypertension    IDA (iron deficiency anemia)    Migraines    Near syncope 03/02/2015   Sciatica neuralgia, right    Stroke    "light stroke 08/2015. No deficits   Tobacco abuse 03/03/2015   Tobacco abuse 03/03/2015   Vertigo     Past Surgical History:  Procedure Laterality Date   ABDOMINAL HYSTERECTOMY     BIOPSY  12/04/2016   Procedure: BIOPSY;  Surgeon: West Bali, MD;  Location: [facility];  Service: Endoscopy;;  gastric and duodenal biopsy   BIOPSY  07/30/2017   Procedure: BIOPSY;  Surgeon: West Bali, MD;  Location: [facility];  Service: Endoscopy;;  random colon bx's   CATARACT EXTRACTION W/PHACO Left 05/19/2021   Procedure: CATARACT EXTRACTION PHACO AND INTRAOCULAR LENS PLACEMENT ([facility]);  Surgeon: Fabio Pierce, MD;  Location: [facility];  Service: Ophthalmology;  Laterality: Left;  CDE: 3.35   CATARACT EXTRACTION W/PHACO Right 06/09/2021    Procedure: CATARACT EXTRACTION PHACO AND INTRAOCULAR LENS PLACEMENT ([facility]);  Surgeon: Fabio Pierce, MD;  Location: [facility];  Service: Ophthalmology;  Laterality: Right;  CDE: 5.99   CESAREAN SECTION     x2   CESAREAN SECTION N/A    Phreesia 09/19/2020   CHOLECYSTECTOMY     COLONOSCOPY WITH ESOPHAGOGASTRODUODENOSCOPY ([facility])  2007   Dr. Juanda Chance: normal   COLONOSCOPY WITH PROPOFOL N/A 07/30/2017   Dr. Darrick Penna: Redundant colon, external/internal hemorrhoids. Colon otherwise normal.  Random colon biopsies negative.   ESOPHAGOGASTRODUODENOSCOPY ([facility]) WITH PROPOFOL N/A 12/04/2016   Dr. Darrick Penna: Gastritis but no H.pylori, small bowel bx negative. gastric polyps benign fundic gland   HEMORRHOID SURGERY      Family History  Problem Relation Age of Onset   ALS Mother    Cardiomyopathy Father    Colon cancer Neg Hx     Social History   Socioeconomic History   Marital status: Married    Spouse name: Not on file   Number of children: Not on file   Years of education: Not on file   Highest education level: Not on file  Occupational History   Not on file  Tobacco Use   Smoking status: Former    Packs/day: 0.10    Years: 20.00    Additional pack years: 0.00    Total pack years: 2.00    Types: Cigarettes  Quit date: 02/23/2021    Years since quitting: 1.5   Smokeless tobacco: Never  Vaping Use   Vaping Use: Never used  Substance and Sexual Activity   Alcohol use: Not Currently    Comment: seldom   Drug use: Yes    Frequency: 2.0 times per week    Types: Marijuana    Comment: occ   Sexual activity: Yes    Birth control/protection: Surgical  Other Topics Concern   Not on file  Social History Narrative   Drinks 5-6 cups of coffee daily.   Social Determinants of Health   Financial Resource Strain: Not on file  Food Insecurity: Not on file  Transportation Needs: Not on file  Physical Activity: Not on file  Stress: Not on file  Social Connections: Not on file  Intimate Partner  Violence: Not on file    Outpatient Medications Prior to Visit  Medication Sig Dispense Refill   ADDERALL XR 30 MG 24 hr capsule Take 30 mg by mouth daily as needed. For focusing while at work     albuterol (VENTOLIN HFA) 108 (90 Base) MCG/ACT inhaler TAKE 2 PUFFS BY MOUTH EVERY 6 HOURS AS NEEDED FOR WHEEZE OR SHORTNESS OF BREATH 8 g 5   ALPRAZolam (XANAX) 1 MG tablet Take 1 mg by mouth 4 (four) times daily as needed.     amLODipine (NORVASC) 10 MG tablet Take 1 tablet (10 mg total) by mouth daily. 90 tablet 1   cetirizine (ZYRTEC) 10 MG tablet TAKE 1 TABLET BY MOUTH EVERY DAY 90 tablet 3   cyclobenzaprine (FLEXERIL) 10 MG tablet Take 1 tablet (10 mg total) by mouth 2 (two) times daily as needed. 30 tablet 1   diclofenac (VOLTAREN) 50 MG EC tablet Take 1 tablet (50 mg total) by mouth 2 (two) times daily. 60 tablet 0   estradiol (ESTRACE) 0.5 MG tablet Take 0.5 mg by mouth daily.     fluticasone (FLONASE) 50 MCG/ACT nasal spray Place 1 spray into both nostrils 2 (two) times daily. 48 mL 1   hydrochlorothiazide (HYDRODIURIL) 25 MG tablet Take 1 tablet (25 mg total) by mouth daily. 90 tablet 1   lidocaine (LIDODERM) 5 % Place 1 patch onto the skin daily. Remove & Discard patch within 12 hours or as directed by MD 30 patch 0   mirtazapine (REMERON) 15 MG tablet Take 15 mg by mouth at bedtime.     pantoprazole (PROTONIX) 40 MG tablet Take 1 tablet (40 mg total) by mouth daily. 90 tablet 3   potassium chloride SA (KLOR-CON M20) 20 MEQ tablet Take 1 tablet (20 mEq total) by mouth daily. 90 tablet 1   pravastatin (PRAVACHOL) 10 MG tablet Take 1 tablet (10 mg total) by mouth daily. 30 tablet 5   triamcinolone cream (KENALOG) 0.1 % Apply 1 application topically 2 (two) times daily. 30 g 0   Vitamin D, Ergocalciferol, (DRISDOL) 1.25 MG (50000 UNIT) CAPS capsule Take 1 capsule (50,000 Units total) by mouth every 7 (seven) days. 12 capsule 1   No facility-administered medications prior to visit.     Allergies  Allergen Reactions   Sulfa Antibiotics Hives and Nausea And Vomiting    ROS Review of Systems  Constitutional:  Negative for chills and fever.  HENT:  Negative for congestion, sinus pressure, sinus pain and sore throat.   Eyes:  Negative for pain and discharge.  Respiratory:  Negative for cough and shortness of breath.   Cardiovascular:  Negative for chest pain and  palpitations.  Gastrointestinal:  Negative for abdominal pain, diarrhea, nausea and vomiting.  Endocrine: Negative for polydipsia and polyuria.  Genitourinary:  Negative for dysuria and hematuria.  Musculoskeletal:  Positive for arthralgias. Negative for neck pain and neck stiffness.  Skin:  Positive for rash.  Neurological:  Negative for dizziness and weakness.  Psychiatric/Behavioral:  Negative for agitation and behavioral problems. The patient is nervous/anxious.       Objective:    Physical Exam Vitals reviewed.  Constitutional:      General: She is not in acute distress.    Appearance: She is obese. She is not diaphoretic.  HENT:     Head: Normocephalic and atraumatic.     Nose: Nose normal.     Mouth/Throat:     Mouth: Mucous membranes are moist.  Eyes:     General: No scleral icterus.    Extraocular Movements: Extraocular movements intact.  Cardiovascular:     Rate and Rhythm: Normal rate and regular rhythm.     Pulses: Normal pulses.     Heart sounds: Normal heart sounds. No murmur heard. Pulmonary:     Breath sounds: Normal breath sounds. No wheezing or rales.  Musculoskeletal:     Cervical back: Neck supple. No tenderness.     Right lower leg: No edema.     Left lower leg: No edema.  Skin:    General: Skin is warm.     Findings: Rash (Lichenied skin around neck area) present.  Neurological:     General: No focal deficit present.     Mental Status: She is alert and oriented to person, place, and time.  Psychiatric:        Mood and Affect: Mood normal.        Behavior:  Behavior normal.     BP (!) 153/92 (BP Location: Right Arm, Patient Position: Sitting, Cuff Size: Normal)   Pulse (!) 109   Ht  (1.575 m)   Wt 177 lb 6.4 oz (80.5 kg)   LMP 05/21/1997   SpO2 95%   BMI 32.45 kg/m  Wt Readings from Last 3 Encounters:  09/03/22 177 lb 6.4 oz (80.5 kg)  08/21/22 170 lb (77.1 kg)  08/21/22 179 lb 6.4 oz (81.4 kg)    Lab Results  Component Value Date   TSH 2.790 04/30/2022   Lab Results  Component Value Date   WBC 6.8 08/21/2022   HGB 12.8 08/21/2022   HCT 39.9 08/21/2022   MCV 94.8 08/21/2022   PLT 190 08/21/2022   Lab Results  Component Value Date   NA 139 08/21/2022   K 3.0 (L) 08/21/2022   CO2 29 08/21/2022   GLUCOSE 146 (H) 08/21/2022   BUN 11 08/21/2022   CREATININE 0.89 08/21/2022   BILITOT <0.2 04/30/2022   ALKPHOS 103 04/30/2022   AST 19 04/30/2022   ALT 16 04/30/2022   PROT 7.0 04/30/2022   ALBUMIN 4.3 04/30/2022   CALCIUM 8.8 (L) 08/21/2022   ANIONGAP 8 08/21/2022   EGFR 79 04/30/2022   Lab Results  Component Value Date   CHOL 232 (H) 04/30/2022   Lab Results  Component Value Date   HDL 51 04/30/2022   Lab Results  Component Value Date   LDLCALC 154 (H) 04/30/2022   Lab Results  Component Value Date   TRIG 149 04/30/2022   Lab Results  Component Value Date   CHOLHDL 4.5 (H) 04/30/2022   Lab Results  Component Value Date   HGBA1C 5.8 (H) 04/30/2022  Assessment & Plan:   Problem List Items Addressed This Visit   None  No orders of the defined types were placed in this encounter.   Follow-up: No follow-ups on file.    Anabel Halon, MD

## 2022-09-03 NOTE — Patient Instructions (Addendum)
Please start taking Chlorthalidone instead of HCTZ.  Please continue taking other medications as prescribed.  Please continue to follow DASH diet and perform moderate exercise/walking at least 150 mins/week.

## 2022-09-04 ENCOUNTER — Ambulatory Visit: Payer: BC Managed Care – PPO | Admitting: Internal Medicine

## 2022-09-06 NOTE — Assessment & Plan Note (Signed)
Chronic b/l hip pain, likely due to OA of hip and/or bursitis Has diclofenac tablets as needed for now Flexeril as needed for back muscle spasms Avoid oral steroids as she has had fatigue and weight gain with it Advised to discuss with Orthopedic surgeon

## 2022-09-06 NOTE — Assessment & Plan Note (Addendum)
BP Readings from Last 1 Encounters:  09/03/22 (!) 148/82   Uncontrolled with HCTZ to 25 mg QD and amlodipine 10 mg QD Likely elevated due to pain, but considering her chronic pain, have to adjust her antihypertensive regimen Switched from HCTZ to chlorthalidone, can also help with resistant leg swelling Counseled for compliance with the medications Advised DASH diet and moderate exercise/walking, at least 150 mins/week

## 2022-09-06 NOTE — Assessment & Plan Note (Signed)
On Adderall Follows up with Psychiatrist 

## 2022-09-06 NOTE — Assessment & Plan Note (Signed)
On Xanax PRN Follows up with Psychiatry - Dr Kaur 

## 2022-09-06 NOTE — Assessment & Plan Note (Signed)
Lipid profile reviewed Needs to follow low cholesterol diet- DASH diet material provided Has tried Crestor and Lipitor - had myalgias Tolerated pravastatin 10 mg QD If she has leg cramps with it, she can be deemed statin intolerant Advised to take co-Q10 for leg cramps

## 2022-09-11 ENCOUNTER — Encounter: Payer: Self-pay | Admitting: Orthopedic Surgery

## 2022-09-11 ENCOUNTER — Ambulatory Visit: Payer: BC Managed Care – PPO | Admitting: Orthopedic Surgery

## 2022-09-11 VITALS — BP 148/86 | HR 93

## 2022-09-11 DIAGNOSIS — M19012 Primary osteoarthritis, left shoulder: Secondary | ICD-10-CM

## 2022-09-11 DIAGNOSIS — G8929 Other chronic pain: Secondary | ICD-10-CM | POA: Diagnosis not present

## 2022-09-11 DIAGNOSIS — M545 Low back pain, unspecified: Secondary | ICD-10-CM

## 2022-09-11 NOTE — Patient Instructions (Signed)

## 2022-09-11 NOTE — Progress Notes (Signed)
Orthopaedic Clinic Return  Assessment: [name] [name] is a 57 y.o. female with the following: Left sided low back pain Left glenohumeral joint arthritis   Plan: [name] [name] continues to have pain in the left buttock.  Pain is most consistent with lumbar back pain, with some radiculopathy.  She states that Tylenol and Goody powders have been effective.  No further medications are needed at this time.  I have provided her with a home exercise program.  She can also find some exercises on YouTube.  She also continues to have pain in her left shoulder.  We have previously injected.  She has known glenohumeral joint arthritis.  We briefly discussed surgery.  Given her age, and current level of function, I think we should exhaust all nonoperative measures.  As such, I am recommending physical therapy.  We will see her back in approximately 2 months.  Follow-up: Return in about 2 months (around 11/11/2022).   Subjective:  Chief Complaint  Patient presents with   Hip Pain    L hip since for awhile gets worse.    History of Present Illness: [name] [name] is a 57 y.o. female who returns to clinic for evaluation of her left hip pain.  She describes pain in the left side of the lower back, and into the left buttock.  She has had this intermittently for several years.  She takes Tylenol and Goody powders.  This helps with her pain.  She has not completed physical therapy for her back.  She does have occasional numbness and tingling.  She is also complaining of pain in the left shoulder.  I have seen her several times for her shoulder pain.  She has glenohumeral joint arthritis.  Injections have provided limited sustained relief.  We have briefly discussed surgery before, and she would like to discuss this in more detail.  She does have some interest in surgery at this point.   Review of Systems: No fevers or chills Occasional numbness or tingling No chest pain No shortness of breath No bowel or  bladder dysfunction No GI distress No headaches   Objective: BP (!) 148/86   Pulse 93   LMP 05/21/1997   Physical Exam:  Alert and oriented.  No acute distress.  Left shoulder without deformity.  No swelling.  No bruising.  Diffuse tenderness to palpation.  Passive forward flexion to 110 degrees.  Abduction to 90 degrees.  External rotation is consistent with contralateral side.  Positive Jobe's.  4+/5 strength in the supraspinatus and infraspinatus.  Negative belly press.  Evaluation of the lower back demonstrates no deformity.  No point tenderness.  She has tenderness within the buttock.  Negative straight leg raise.  Good bilateral lower extremity strength.    IMAGING: I personally ordered and reviewed the following images:  No new x-rays obtained today.  Oliver Barre, MD 09/11/2022 9:12 AM

## 2022-09-19 ENCOUNTER — Ambulatory Visit: Payer: BC Managed Care – PPO | Admitting: Internal Medicine

## 2022-10-02 DIAGNOSIS — M25512 Pain in left shoulder: Secondary | ICD-10-CM | POA: Diagnosis not present

## 2022-10-02 DIAGNOSIS — R293 Abnormal posture: Secondary | ICD-10-CM | POA: Diagnosis not present

## 2022-10-02 DIAGNOSIS — M25612 Stiffness of left shoulder, not elsewhere classified: Secondary | ICD-10-CM | POA: Diagnosis not present

## 2022-10-05 DIAGNOSIS — M25612 Stiffness of left shoulder, not elsewhere classified: Secondary | ICD-10-CM | POA: Diagnosis not present

## 2022-10-05 DIAGNOSIS — M25512 Pain in left shoulder: Secondary | ICD-10-CM | POA: Diagnosis not present

## 2022-10-05 DIAGNOSIS — R293 Abnormal posture: Secondary | ICD-10-CM | POA: Diagnosis not present

## 2022-10-08 ENCOUNTER — Encounter: Payer: Self-pay | Admitting: Orthopedic Surgery

## 2022-10-08 ENCOUNTER — Telehealth: Payer: Self-pay | Admitting: Internal Medicine

## 2022-10-08 DIAGNOSIS — M25512 Pain in left shoulder: Secondary | ICD-10-CM | POA: Diagnosis not present

## 2022-10-08 DIAGNOSIS — R293 Abnormal posture: Secondary | ICD-10-CM | POA: Diagnosis not present

## 2022-10-08 DIAGNOSIS — M25612 Stiffness of left shoulder, not elsewhere classified: Secondary | ICD-10-CM | POA: Diagnosis not present

## 2022-10-08 NOTE — Telephone Encounter (Signed)
Spoke with patient and note was provided.

## 2022-10-08 NOTE — Telephone Encounter (Signed)
Pt lvm wants a callback. Pt is in needs of letter for insurance about her shoulder. Wants a call back

## 2022-10-09 DIAGNOSIS — F4310 Post-traumatic stress disorder, unspecified: Secondary | ICD-10-CM | POA: Diagnosis not present

## 2022-10-09 DIAGNOSIS — F900 Attention-deficit hyperactivity disorder, predominantly inattentive type: Secondary | ICD-10-CM | POA: Diagnosis not present

## 2022-10-10 DIAGNOSIS — R293 Abnormal posture: Secondary | ICD-10-CM | POA: Diagnosis not present

## 2022-10-10 DIAGNOSIS — M25612 Stiffness of left shoulder, not elsewhere classified: Secondary | ICD-10-CM | POA: Diagnosis not present

## 2022-10-10 DIAGNOSIS — M25512 Pain in left shoulder: Secondary | ICD-10-CM | POA: Diagnosis not present

## 2022-10-17 ENCOUNTER — Encounter: Payer: Self-pay | Admitting: Internal Medicine

## 2022-10-23 DIAGNOSIS — M25512 Pain in left shoulder: Secondary | ICD-10-CM | POA: Diagnosis not present

## 2022-10-23 DIAGNOSIS — R293 Abnormal posture: Secondary | ICD-10-CM | POA: Diagnosis not present

## 2022-10-23 DIAGNOSIS — M25612 Stiffness of left shoulder, not elsewhere classified: Secondary | ICD-10-CM | POA: Diagnosis not present

## 2022-10-26 ENCOUNTER — Telehealth: Payer: Self-pay | Admitting: Orthopedic Surgery

## 2022-10-26 NOTE — Telephone Encounter (Signed)
Dr. Dallas Schimke pt - Returned the pt's call and scheduled her for 10/30/22.  She is requesting pain medication to be sent to [facility], but would like something a tad bit stronger than Tramadol.  She stated she's in a lot of pain.

## 2022-10-30 ENCOUNTER — Encounter: Payer: Self-pay | Admitting: Orthopedic Surgery

## 2022-10-30 ENCOUNTER — Ambulatory Visit: Payer: BC Managed Care – PPO | Admitting: Orthopedic Surgery

## 2022-10-30 VITALS — BP 137/89 | HR 109 | Ht 62.0 in | Wt 173.0 lb

## 2022-10-30 DIAGNOSIS — M19012 Primary osteoarthritis, left shoulder: Secondary | ICD-10-CM | POA: Diagnosis not present

## 2022-10-30 DIAGNOSIS — G8929 Other chronic pain: Secondary | ICD-10-CM

## 2022-10-30 DIAGNOSIS — M19011 Primary osteoarthritis, right shoulder: Secondary | ICD-10-CM | POA: Diagnosis not present

## 2022-10-30 DIAGNOSIS — M25511 Pain in right shoulder: Secondary | ICD-10-CM

## 2022-10-30 DIAGNOSIS — M25512 Pain in left shoulder: Secondary | ICD-10-CM

## 2022-10-30 MED ORDER — TRAMADOL HCL 50 MG PO TABS: 50.0000 mg | ORAL_TABLET | Freq: Two times a day (BID) | ORAL | 0 refills | Status: DC | PRN

## 2022-10-30 NOTE — Progress Notes (Signed)
Orthopaedic Clinic Return  Assessment: [name] [name] is a 57 y.o. female with the following: Left glenohumeral joint arthritis Right glenohumeral arthritis   Plan: [name] [name] has had recent exacerbation of the pain in the left shoulder.  She was doing well with physical therapy.  However, she fell onto her back, and this worsened her pain.  Since then, she has had severe pain, and limited motion.  She has not resumed physical therapy.  We have previously discussed proceeding with left shoulder surgery, and she is now more interested in working toward surgery.  We briefly discussed shoulder replacement.  Given her recent fall, with restricted motion, I would like to obtain an MRI to fully evaluate the integrity of the rotator cuff tendons.  This will be scheduled, and we will need to discuss the findings.  We have also discussed that she will need to obtain medical clearance prior to scheduling surgery.  If proceeding with shoulder replacement, we will need to obtain a CT scan for operative planning.  She states her understanding.  Current pain in her right shoulder.  She would like to repeat an injection.  This was completed in clinic today.  Procedure note injection - Right shoulder    Verbal consent was obtained to inject the right shoulder, subacromial space Timeout was completed to confirm the site of injection.   The skin was prepped with alcohol and ethyl chloride was sprayed at the injection site.  A 21-gauge needle was used to inject 40 mg of Depo-Medrol and 1% lidocaine (3 cc) into the subacromial space of the right shoulder using a posterolateral approach.  There were no complications.  A sterile bandage was applied.    Follow-up: Return for After MRI.   Subjective:  Chief Complaint  Patient presents with   Shoulder Pain    Wants injection right shoulder and wants to discuss surgery for left shoulder     History of Present Illness: [name] [name] is a 57 y.o. female who  returns to clinic for evaluation of her left shoulder pain.  She has chronic pain in the left shoulder.  She has known glenohumeral joint arthritis.  She has tried injections.  She was more recently working with physical therapy, and had noted some improvements.  However, she stumbled in her yard, fell on her back.  Since then, she has had severe pain in the left shoulder.  She has not been able to resume PT.  The therapist recommended she return to clinic.  She is requesting pain medications.  It is affecting her sleep.  She would like to discuss surgery.   Review of Systems: No fevers or chills Occasional numbness or tingling No chest pain No shortness of breath No bowel or bladder dysfunction No GI distress No headaches   Objective: BP 137/89   Pulse (!) 109   Ht 5\' 2"  (1.575 m)   Wt 173 lb (78.5 kg)   LMP 05/21/1997   BMI 31.64 kg/m   Physical Exam:  Alert and oriented.  No acute distress.  Left shoulder without deformity.  No swelling.  No bruising.  Diffuse tenderness to palpation.  Passive forward flexion to 110 degrees.  Abduction to 90 degrees.  External rotation is consistent with contralateral side.  Positive Jobe's.  4+/5 strength in the supraspinatus and infraspinatus.  Negative belly press.  Right shoulder without deformity.  Slightly restricted range of motion.  She has good strength.  No numbness or tingling in the right hand.  Negative belly press.  Positive Jobe's.  IMAGING: I personally ordered and reviewed the following images:  No new x-rays obtained today.  Oliver Barre, MD 10/30/2022 11:58 AM

## 2022-10-30 NOTE — Patient Instructions (Signed)

## 2022-10-31 ENCOUNTER — Other Ambulatory Visit: Payer: Self-pay | Admitting: Internal Medicine

## 2022-10-31 ENCOUNTER — Encounter: Payer: Self-pay | Admitting: *Deleted

## 2022-10-31 ENCOUNTER — Ambulatory Visit: Payer: BC Managed Care – PPO | Attending: Internal Medicine | Admitting: Internal Medicine

## 2022-10-31 ENCOUNTER — Encounter: Payer: Self-pay | Admitting: Internal Medicine

## 2022-10-31 ENCOUNTER — Ambulatory Visit: Payer: BC Managed Care – PPO | Admitting: Internal Medicine

## 2022-10-31 VITALS — BP 124/72 | HR 94 | Ht 62.0 in | Wt 176.0 lb

## 2022-10-31 DIAGNOSIS — R079 Chest pain, unspecified: Secondary | ICD-10-CM

## 2022-10-31 DIAGNOSIS — E559 Vitamin D deficiency, unspecified: Secondary | ICD-10-CM

## 2022-10-31 NOTE — Patient Instructions (Signed)
Medication Instructions:  Your physician recommends that you continue on your current medications as directed. Please refer to the Current Medication list given to you today.   Labwork: None  Testing/Procedures: Your physician has requested that you have en exercise stress myoview. For further information please visit [email or web address] Please follow instruction sheet, as given.   Follow-Up: Your physician recommends that you schedule a follow-up appointment in: Pending Results  Any Other Special Instructions Will Be Listed Below (If Applicable).  If you need a refill on your cardiac medications before your next appointment, please call your pharmacy.

## 2022-10-31 NOTE — Progress Notes (Signed)
Cardiology Office Note  Date: 10/31/2022   ID: [name] [name], DOB 06-17-1965, MRN [id number]  PCP:  Anabel Halon, MD  Cardiologist:  None Electrophysiologist:  None   Reason for Office Visit: Evaluation of chest pain at the request of Dr. Suezanne Jacquet.   History of Present Illness: [name] [name] is a 57 y.o. female known to have HTN, HLD was referred to cardiology clinic for evaluation of chest pain.  Patient had ER visit in 4/24 for evaluation of chest pain and palpitations. EKG showed NSR and no ischemia. Troponins were within normal limits. She is here as new patient visit. Patient reported bilateral shoulder arthritis, L > R. She usually does not like to take pills and hence did not take any pain medications until recently she was placed on opioids for pain relief.  She stated that whenever her pain gets worse in her shoulder/s, her blood pressure increases significantly to 170-180 mm Hg SBP followed by chest pains and palpitations. These chest pains usually last for few minutes to 1 hour. Frequency is once a week.  She also said she has a high pain tolerance level.  She talked to her Ortho and said she will be scheduled for shoulder replacement surgery soon.  Past Medical History:  Diagnosis Date   Allergy    Phreesia 09/19/2020   Anxiety    Bradycardia 03/02/2015   Depression    GERD (gastroesophageal reflux disease)    History of hysterectomy    History of oophorectomy    Hx of cholecystectomy    Hypertension    IDA (iron deficiency anemia)    Migraines    Near syncope 03/02/2015   Sciatica neuralgia, right    Stroke (HCC)    "light stroke 08/2015. No deficits   Tobacco abuse 03/03/2015   Tobacco abuse 03/03/2015   Vertigo     Past Surgical History:  Procedure Laterality Date   ABDOMINAL HYSTERECTOMY     BIOPSY  12/04/2016   Procedure: BIOPSY;  Surgeon: West Bali, MD;  Location: [facility];  Service: Endoscopy;;  gastric and duodenal biopsy   BIOPSY   07/30/2017   Procedure: BIOPSY;  Surgeon: West Bali, MD;  Location: [facility];  Service: Endoscopy;;  random colon bx's   CATARACT EXTRACTION W/PHACO Left 05/19/2021   Procedure: CATARACT EXTRACTION PHACO AND INTRAOCULAR LENS PLACEMENT (IOC);  Surgeon: Fabio Pierce, MD;  Location: [facility];  Service: Ophthalmology;  Laterality: Left;  CDE: 3.35   CATARACT EXTRACTION W/PHACO Right 06/09/2021   Procedure: CATARACT EXTRACTION PHACO AND INTRAOCULAR LENS PLACEMENT (IOC);  Surgeon: Fabio Pierce, MD;  Location: [facility];  Service: Ophthalmology;  Laterality: Right;  CDE: 5.99   CESAREAN SECTION     x2   CESAREAN SECTION N/A    Phreesia 09/19/2020   CHOLECYSTECTOMY     COLONOSCOPY WITH ESOPHAGOGASTRODUODENOSCOPY (EGD)  2007   Dr. Juanda Chance: normal   COLONOSCOPY WITH PROPOFOL N/A 07/30/2017   Dr. Darrick Penna: Redundant colon, external/internal hemorrhoids. Colon otherwise normal.  Random colon biopsies negative.   ESOPHAGOGASTRODUODENOSCOPY (EGD) WITH PROPOFOL N/A 12/04/2016   Dr. Darrick Penna: Gastritis but no H.pylori, small bowel bx negative. gastric polyps benign fundic gland   HEMORRHOID SURGERY      Current Outpatient Medications  Medication Sig Dispense Refill   ADDERALL XR 30 MG 24 hr capsule Take 30 mg by mouth daily as needed. For focusing while at work     albuterol (VENTOLIN HFA) 108 (90 Base) MCG/ACT inhaler TAKE 2  PUFFS BY MOUTH EVERY 6 HOURS AS NEEDED FOR WHEEZE OR SHORTNESS OF BREATH 8 g 5   ALPRAZolam (XANAX) 1 MG tablet Take 1 mg by mouth 4 (four) times daily as needed.     amLODipine (NORVASC) 10 MG tablet Take 1 tablet (10 mg total) by mouth daily. 90 tablet 1   cetirizine (ZYRTEC) 10 MG tablet TAKE 1 TABLET BY MOUTH EVERY DAY 90 tablet 3   chlorthalidone (HYGROTON) 25 MG tablet Take 1 tablet (25 mg total) by mouth daily. 90 tablet 1   cyclobenzaprine (FLEXERIL) 10 MG tablet Take 1 tablet (10 mg total) by mouth 2 (two) times daily as needed. 30 tablet 1   diclofenac (VOLTAREN) 50 MG  EC tablet Take 1 tablet (50 mg total) by mouth 2 (two) times daily. 60 tablet 0   estradiol (ESTRACE) 0.5 MG tablet Take 0.5 mg by mouth daily.     fluticasone (FLONASE) 50 MCG/ACT nasal spray Place 1 spray into both nostrils 2 (two) times daily. 48 mL 1   lidocaine (LIDODERM) 5 % Place 1 patch onto the skin daily. Remove & Discard patch within 12 hours or as directed by MD 30 patch 0   mirtazapine (REMERON) 15 MG tablet Take 15 mg by mouth at bedtime.     Misc. Devices MISC Blood pressure cuff/device - 1. ICD10: I10 1 each 0   pantoprazole (PROTONIX) 40 MG tablet Take 1 tablet (40 mg total) by mouth daily. 90 tablet 3   potassium chloride SA (KLOR-CON M20) 20 MEQ tablet Take 1 tablet (20 mEq total) by mouth daily. 90 tablet 1   pravastatin (PRAVACHOL) 10 MG tablet Take 1 tablet (10 mg total) by mouth daily. 30 tablet 5   traMADol (ULTRAM) 50 MG tablet Take 1 tablet (50 mg total) by mouth every 12 (twelve) hours as needed. 30 tablet 0   triamcinolone cream (KENALOG) 0.1 % Apply 1 application topically 2 (two) times daily. 30 g 0   Vitamin D, Ergocalciferol, (DRISDOL) 1.25 MG (50000 UNIT) CAPS capsule TAKE 1 CAPSULE BY MOUTH EVERY 7 DAYS. 12 capsule 1   No current facility-administered medications for this visit.   Allergies:  Sulfa antibiotics   Social History: The patient  reports that she quit smoking about 20 months ago. Her smoking use included cigarettes. She has a 2.00 pack-year smoking history. She has never used smokeless tobacco. She reports that she does not currently use alcohol. She reports current drug use. Frequency: 2.00 times per week. Drug: Marijuana.   Family History: The patient's family history includes ALS in her mother; Cardiomyopathy in her father.   ROS:  Please see the history of present illness. Otherwise, complete review of systems is positive for none.  All other systems are reviewed and negative.   Physical Exam: VS:  LMP 05/21/1997 , BMI There is no height or  weight on file to calculate BMI.  Wt Readings from Last 3 Encounters:  10/30/22 173 lb (78.5 kg)  09/03/22 177 lb 6.4 oz (80.5 kg)  08/21/22 170 lb (77.1 kg)    General: Patient appears comfortable at rest. HEENT: Conjunctiva and lids normal, oropharynx clear with moist mucosa. Neck: Supple, no elevated JVP or carotid bruits, no thyromegaly. Lungs: Clear to auscultation, nonlabored breathing at rest. Cardiac: Regular rate and rhythm, no S3 or significant systolic murmur, no pericardial rub. Abdomen: Soft, nontender, no hepatomegaly, bowel sounds present, no guarding or rebound. Extremities: No pitting edema, distal pulses 2+. Skin: Warm and dry. Musculoskeletal: No  kyphosis. Neuropsychiatric: Alert and oriented x3, affect grossly appropriate.  Recent Labwork: 04/30/2022: ALT 16; AST 19; TSH 2.790 08/21/2022: BUN 11; Creatinine, Ser 0.89; Hemoglobin 12.8; Platelets 190; Potassium 3.0; Sodium 139     Component Value Date/Time   CHOL 232 (H) 04/30/2022 1518   TRIG 149 04/30/2022 1518   HDL 51 04/30/2022 1518   CHOLHDL 4.5 (H) 04/30/2022 1518   LDLCALC 154 (H) 04/30/2022 1518    Assessment and Plan: Patient is a 57 year old F known to have HTN, HLD was referred to cardiology clinic for evaluation of chest pain.  # Chest pain likely secondary to poorly controlled HTN but cannot rule out CAD -Patient has chest pains in the setting of elevated blood pressures from uncontrolled pain from bilateral shoulder arthritis. She will be scheduled for shoulder replacement surgery soon. Will obtain exercise Myoview. No murmur, no need of echo. Echocardiogram from 2016 was unremarkable, normal LVEF and no valve disease.  # HTN, controlled -Continue amlodipine 10 mg once daily, chlorthalidone 25 mg once daily.  HTN management per PCP.  # HLD, not at goal (LDL 154 in 12/23) -Continue pravastatin 10 mg at bedtime, goal LDL less than 100.   I have spent a total of 45 minutes with patient reviewing  chart, EKGs, labs and examining patient as well as establishing an assessment and plan that was discussed with the patient.  > 50% of time was spent in direct patient care.    Medication Adjustments/Labs and Tests Ordered: Current medicines are reviewed at length with the patient today.  Concerns regarding medicines are outlined above.   Tests Ordered: No orders of the defined types were placed in this encounter.   Medication Changes: No orders of the defined types were placed in this encounter.   Disposition:  Follow up  pending results  Signed, Jareli Highland Verne Spurr, MD, 10/31/2022 4:04 PM    [facility] [address], [place], [place] [address]

## 2022-11-07 ENCOUNTER — Other Ambulatory Visit: Payer: Self-pay | Admitting: Internal Medicine

## 2022-11-07 ENCOUNTER — Encounter (HOSPITAL_COMMUNITY)
Admission: RE | Admit: 2022-11-07 | Discharge: 2022-11-07 | Disposition: A | Discharge status: Home / Self Care | Payer: BC Managed Care – PPO | Source: Ambulatory Visit | Attending: Internal Medicine | Admitting: Internal Medicine

## 2022-11-07 ENCOUNTER — Ambulatory Visit (HOSPITAL_COMMUNITY)
Admission: RE | Admit: 2022-11-07 | Discharge: 2022-11-07 | Disposition: A | Discharge status: Home / Self Care | Payer: BC Managed Care – PPO | Source: Ambulatory Visit | Attending: Internal Medicine | Admitting: Internal Medicine

## 2022-11-07 ENCOUNTER — Encounter (HOSPITAL_COMMUNITY): Payer: Self-pay

## 2022-11-07 ENCOUNTER — Telehealth: Payer: Self-pay | Admitting: Internal Medicine

## 2022-11-07 DIAGNOSIS — M5136 Other intervertebral disc degeneration, lumbar region: Secondary | ICD-10-CM

## 2022-11-07 DIAGNOSIS — R079 Chest pain, unspecified: Secondary | ICD-10-CM | POA: Insufficient documentation

## 2022-11-07 DIAGNOSIS — E876 Hypokalemia: Secondary | ICD-10-CM

## 2022-11-07 LAB — NM MYOCAR MULTI W/SPECT W/WALL MOTION / EF
Angina Index: 0
Duke Treadmill Score: 7
Estimated workload: 10.1
Exercise duration (min): 7 min
Exercise duration (sec): 17 s
LV dias vol: 53 mL (ref 46–106)
LV sys vol: 13 mL
MPHR: 164 {beats}/min
Nuc Stress EF: 75 %
Peak HR: 133 {beats}/min
Percent HR: 81 %
RATE: 0.3
RPE: 15
Rest HR: 74 {beats}/min
Rest Nuclear Isotope Dose: 10.8 mCi
SDS: 0
SRS: 0
SSS: 0
ST Depression (mm): 0 mm
Stress Nuclear Isotope Dose: 32 mCi
TID: 0.98

## 2022-11-07 MED ORDER — REGADENOSON 0.4 MG/5ML IV SOLN
INTRAVENOUS | Status: AC
  Administered 2022-11-07: 0.4 mg via INTRAVENOUS
  Filled 2022-11-07: qty 5

## 2022-11-07 MED ORDER — SODIUM CHLORIDE FLUSH 0.9 % IV SOLN
INTRAVENOUS | Status: AC
  Administered 2022-11-07: 10 mL via INTRAVENOUS
  Filled 2022-11-07: qty 10

## 2022-11-07 MED ORDER — CYCLOBENZAPRINE HCL 10 MG PO TABS
10.0000 mg | ORAL_TABLET | Freq: Two times a day (BID) | ORAL | 1 refills | Status: DC | PRN
Start: 2022-11-07 — End: 2023-01-24

## 2022-11-07 MED ORDER — TECHNETIUM TC 99M TETROFOSMIN IV KIT
30.0000 | PACK | Freq: Once | INTRAVENOUS | Status: AC | PRN
  Administered 2022-11-07: 32 via INTRAVENOUS

## 2022-11-07 MED ORDER — TECHNETIUM TC 99M TETROFOSMIN IV KIT
10.0000 | PACK | Freq: Once | INTRAVENOUS | Status: AC | PRN
  Administered 2022-11-07: 10.8 via INTRAVENOUS

## 2022-11-07 NOTE — Telephone Encounter (Signed)
Patient advised.

## 2022-11-07 NOTE — Telephone Encounter (Signed)
Prescription Request  11/07/2022  LOV: 09/03/2022  What is the name of the medication or equipment? cyclobenzaprine (FLEXERIL) 10 MG tablet [[id number]]   Have you contacted your pharmacy to request a refill? Yes   Which pharmacy would you like this sent to?  [facility] - [place], [place] - [address] [place] [address] Phone: [phone] Fax: [phone]    Patient notified that their request is being sent to the clinical staff for review and that they should receive a response within 2 business days.   Please advise at Mobile [phone] (mobile)

## 2022-11-12 ENCOUNTER — Ambulatory Visit
Admission: RE | Admit: 2022-11-12 | Discharge: 2022-11-12 | Disposition: A | Discharge status: Home / Self Care | Payer: BC Managed Care – PPO | Source: Ambulatory Visit | Attending: Orthopedic Surgery | Admitting: Orthopedic Surgery

## 2022-11-12 ENCOUNTER — Telehealth: Payer: Self-pay | Admitting: Internal Medicine

## 2022-11-12 ENCOUNTER — Other Ambulatory Visit: Payer: Self-pay

## 2022-11-12 DIAGNOSIS — G4730 Sleep apnea, unspecified: Secondary | ICD-10-CM

## 2022-11-12 DIAGNOSIS — M19012 Primary osteoarthritis, left shoulder: Secondary | ICD-10-CM

## 2022-11-12 DIAGNOSIS — M67812 Other specified disorders of synovium, left shoulder: Secondary | ICD-10-CM | POA: Diagnosis not present

## 2022-11-12 NOTE — Telephone Encounter (Signed)
Order placed

## 2022-11-12 NOTE — Telephone Encounter (Signed)
Patient called to report she's having difficulty staying awake; falls asleep very easily. Also wants to follow up on referral for sleep apnea test; never scheduled. Requesting call back from nurse.   Please advise at [phone].

## 2022-11-13 ENCOUNTER — Encounter: Payer: Self-pay | Admitting: Orthopedic Surgery

## 2022-11-13 ENCOUNTER — Ambulatory Visit (INDEPENDENT_AMBULATORY_CARE_PROVIDER_SITE_OTHER): Payer: BC Managed Care – PPO | Admitting: Orthopedic Surgery

## 2022-11-13 DIAGNOSIS — M19012 Primary osteoarthritis, left shoulder: Secondary | ICD-10-CM

## 2022-11-13 NOTE — Progress Notes (Signed)
Patient was not seen in clinic.  Visit was scheduled to review MRI results and results are not available.  Follow up in 2 weeks for re-evaluation.

## 2022-12-03 ENCOUNTER — Encounter: Payer: Self-pay | Admitting: Internal Medicine

## 2022-12-03 ENCOUNTER — Ambulatory Visit (INDEPENDENT_AMBULATORY_CARE_PROVIDER_SITE_OTHER): Payer: BC Managed Care – PPO | Admitting: Internal Medicine

## 2022-12-03 VITALS — BP 134/76 | HR 105 | Ht 62.0 in | Wt 179.4 lb

## 2022-12-03 DIAGNOSIS — E876 Hypokalemia: Secondary | ICD-10-CM

## 2022-12-03 DIAGNOSIS — M2550 Pain in unspecified joint: Secondary | ICD-10-CM | POA: Diagnosis not present

## 2022-12-03 DIAGNOSIS — K219 Gastro-esophageal reflux disease without esophagitis: Secondary | ICD-10-CM

## 2022-12-03 DIAGNOSIS — G4733 Obstructive sleep apnea (adult) (pediatric): Secondary | ICD-10-CM

## 2022-12-03 DIAGNOSIS — I10 Essential (primary) hypertension: Secondary | ICD-10-CM

## 2022-12-03 MED ORDER — POTASSIUM CHLORIDE 20 MEQ/15ML (10%) PO SOLN
20.0000 meq | Freq: Every day | ORAL | 5 refills | Status: DC
Start: 2022-12-03 — End: 2022-12-19

## 2022-12-03 NOTE — Patient Instructions (Addendum)
Please take Amlodipine 10 mg once daily and Chlorthalidone 25 mg once daily.  Please continue to take other medications as prescribed.  Please continue to follow low carb diet and perform moderate exercise/walking at least 150 mins/week.

## 2022-12-03 NOTE — Progress Notes (Unsigned)
Established Patient Office Visit  Subjective:  Patient ID: [name] [name], female    DOB: 02/28/1966  Age: 57 y.o. MRN: [id number]  CC:  Chief Complaint  Patient presents with   Hypertension    Three month follow up   Sleep Apnea    Follow up    HPI [name] [name] is a 57 y.o. female with past medical history of HTN,  GERD/gastritis, ADD with anxiety and obesity who presents for f/u of her chronic medical conditions.  HTN: BP is wnl.  She has started taking chlorthalidone 25 mg QD regularly. Patient denies headache, dizziness, chest pain, dyspnea or palpitations.  Her leg swelling has improved since switching from HCTZ to chlorthalidone.  She is unclear if she is taking amlodipine or not, but used to take it regularly in the past.  She has chronic leg cramps.  She takes potassium tablets for hypokalemia, but reports difficulty swallowing potassium tablet due to the size of it.  She has chronic low back pain and hip pain.  She also reports chronic knee pain and shoulder pain.  She has family history of rheumatoid arthritis.  She has tried oral NSAIDs.  She takes tramadol as needed for severe pain.  She has Flexeril for back muscle spasms.  She follows up with Psychiatry for ADD and anxiety. Denies anhedonia, SI or HI.   She has been doing diet modification and exercises regularly.  She has not been able to lose any weight from last 6 months. She is interested in medical weight loss, but Reginal Lutes was not covered by her insurance.  She is not a candidate for phentermine or Qsymia due to concurrent use of Adderall for ADD.  She still reports sleep interruptions, snoring and daytime fatigue.  She has not had sleep study yet.   Past Medical History:  Diagnosis Date   Allergy    Phreesia 09/19/2020   Anxiety    Bradycardia 03/02/2015   Depression    GERD (gastroesophageal reflux disease)    History of hysterectomy    History of oophorectomy    Hx of cholecystectomy    Hypertension     IDA (iron deficiency anemia)    Migraines    Near syncope 03/02/2015   Sciatica neuralgia, right    Stroke (HCC)    "light stroke 08/2015. No deficits   Tobacco abuse 03/03/2015   Tobacco abuse 03/03/2015   Vertigo     Past Surgical History:  Procedure Laterality Date   ABDOMINAL HYSTERECTOMY     BIOPSY  12/04/2016   Procedure: BIOPSY;  Surgeon: West Bali, MD;  Location: [facility];  Service: Endoscopy;;  gastric and duodenal biopsy   BIOPSY  07/30/2017   Procedure: BIOPSY;  Surgeon: West Bali, MD;  Location: [facility];  Service: Endoscopy;;  random colon bx's   CATARACT EXTRACTION W/PHACO Left 05/19/2021   Procedure: CATARACT EXTRACTION PHACO AND INTRAOCULAR LENS PLACEMENT (IOC);  Surgeon: Fabio Pierce, MD;  Location: [facility];  Service: Ophthalmology;  Laterality: Left;  CDE: 3.35   CATARACT EXTRACTION W/PHACO Right 06/09/2021   Procedure: CATARACT EXTRACTION PHACO AND INTRAOCULAR LENS PLACEMENT (IOC);  Surgeon: Fabio Pierce, MD;  Location: [facility];  Service: Ophthalmology;  Laterality: Right;  CDE: 5.99   CESAREAN SECTION     x2   CESAREAN SECTION N/A    Phreesia 09/19/2020   CHOLECYSTECTOMY     COLONOSCOPY WITH ESOPHAGOGASTRODUODENOSCOPY (EGD)  2007   Dr. Juanda Chance: normal   COLONOSCOPY WITH  PROPOFOL N/A 07/30/2017   Dr. Darrick Penna: Redundant colon, external/internal hemorrhoids. Colon otherwise normal.  Random colon biopsies negative.   ESOPHAGOGASTRODUODENOSCOPY (EGD) WITH PROPOFOL N/A 12/04/2016   Dr. Darrick Penna: Gastritis but no H.pylori, small bowel bx negative. gastric polyps benign fundic gland   HEMORRHOID SURGERY      Family History  Problem Relation Age of Onset   ALS Mother    Cardiomyopathy Father    Colon cancer Neg Hx     Social History   Socioeconomic History   Marital status: Married    Spouse name: Not on file   Number of children: Not on file   Years of education: Not on file   Highest education level: Not on file  Occupational History    Not on file  Tobacco Use   Smoking status: Former    Current packs/day: 0.00    Average packs/day: 0.1 packs/day for 20.0 years (2.0 ttl pk-yrs)    Types: Cigarettes    Start date: 02/23/2001    Quit date: 02/23/2021    Years since quitting: 1.7   Smokeless tobacco: Never  Vaping Use   Vaping status: Never Used  Substance and Sexual Activity   Alcohol use: Not Currently    Comment: seldom   Drug use: Yes    Frequency: 2.0 times per week    Types: Marijuana    Comment: occ   Sexual activity: Yes    Birth control/protection: Surgical  Other Topics Concern   Not on file  Social History Narrative   Drinks 5-6 cups of coffee daily.   Social Determinants of Health   Financial Resource Strain: Not on file  Food Insecurity: Not on file  Transportation Needs: Not on file  Physical Activity: Not on file  Stress: Not on file  Social Connections: Not on file  Intimate Partner Violence: Not on file    Outpatient Medications Prior to Visit  Medication Sig Dispense Refill   ADDERALL XR 30 MG 24 hr capsule Take 30 mg by mouth daily. For focusing while at work     albuterol (VENTOLIN HFA) 108 (90 Base) MCG/ACT inhaler TAKE 2 PUFFS BY MOUTH EVERY 6 HOURS AS NEEDED FOR WHEEZE OR SHORTNESS OF BREATH 8 g 5   ALPRAZolam (XANAX) 1 MG tablet Take 1 mg by mouth 4 (four) times daily as needed.     cetirizine (ZYRTEC) 10 MG tablet TAKE 1 TABLET BY MOUTH EVERY DAY 90 tablet 3   chlorthalidone (HYGROTON) 25 MG tablet Take 1 tablet (25 mg total) by mouth daily. 90 tablet 1   cyclobenzaprine (FLEXERIL) 10 MG tablet Take 1 tablet (10 mg total) by mouth 2 (two) times daily as needed. 30 tablet 1   diclofenac (VOLTAREN) 50 MG EC tablet Take 1 tablet (50 mg total) by mouth 2 (two) times daily. 60 tablet 0   estradiol (ESTRACE) 0.5 MG tablet Take 0.5 mg by mouth daily.     fluticasone (FLONASE) 50 MCG/ACT nasal spray Place 1 spray into both nostrils 2 (two) times daily. 48 mL 1   lidocaine (LIDODERM) 5 %  Place 1 patch onto the skin daily. Remove & Discard patch within 12 hours or as directed by MD 30 patch 0   Misc. Devices MISC Blood pressure cuff/device - 1. ICD10: I10 1 each 0   pantoprazole (PROTONIX) 40 MG tablet Take 1 tablet (40 mg total) by mouth daily. 90 tablet 3   traMADol (ULTRAM) 50 MG tablet Take 1 tablet (50 mg total) by mouth  every 12 (twelve) hours as needed. 30 tablet 0   Vitamin D, Ergocalciferol, (DRISDOL) 1.25 MG (50000 UNIT) CAPS capsule TAKE 1 CAPSULE BY MOUTH EVERY 7 DAYS. 12 capsule 1   amLODipine (NORVASC) 10 MG tablet Take 1 tablet (10 mg total) by mouth daily. 90 tablet 1   mirtazapine (REMERON) 15 MG tablet Take 15 mg by mouth at bedtime. (Patient not taking: Reported on 10/31/2022)     potassium chloride SA (KLOR-CON M) 20 MEQ tablet TAKE (1) TABLET BY MOUTH ONCE DAILY. 90 tablet 0   pravastatin (PRAVACHOL) 10 MG tablet Take 1 tablet (10 mg total) by mouth daily. 30 tablet 5   triamcinolone cream (KENALOG) 0.1 % Apply 1 application topically 2 (two) times daily. (Patient not taking: Reported on 10/31/2022) 30 g 0   No facility-administered medications prior to visit.    Allergies  Allergen Reactions   Sulfa Antibiotics Hives and Nausea And Vomiting    ROS Review of Systems  Constitutional:  Negative for chills and fever.  HENT:  Negative for congestion, sinus pressure, sinus pain and sore throat.   Eyes:  Negative for pain and discharge.  Respiratory:  Negative for cough and shortness of breath.   Cardiovascular:  Negative for chest pain and palpitations.  Gastrointestinal:  Negative for abdominal pain, diarrhea, nausea and vomiting.  Endocrine: Negative for polydipsia and polyuria.  Genitourinary:  Negative for dysuria and hematuria.  Musculoskeletal:  Positive for arthralgias and back pain. Negative for neck pain and neck stiffness.  Skin:  Negative for rash.  Neurological:  Negative for dizziness and weakness.  Psychiatric/Behavioral:  Negative for  agitation and behavioral problems. The patient is nervous/anxious.       Objective:    Physical Exam Vitals reviewed.  Constitutional:      General: She is not in acute distress.    Appearance: She is obese. She is not diaphoretic.  HENT:     Head: Normocephalic and atraumatic.     Nose: Nose normal.     Mouth/Throat:     Mouth: Mucous membranes are moist.  Eyes:     General: No scleral icterus.    Extraocular Movements: Extraocular movements intact.  Cardiovascular:     Rate and Rhythm: Normal rate and regular rhythm.     Pulses: Normal pulses.     Heart sounds: Normal heart sounds. No murmur heard. Pulmonary:     Breath sounds: Normal breath sounds. No wheezing or rales.  Musculoskeletal:     Cervical back: Neck supple. No tenderness.     Right lower leg: No edema.     Left lower leg: No edema.  Skin:    General: Skin is warm.     Findings: Rash (Lichenied skin around neck area) present.  Neurological:     General: No focal deficit present.     Mental Status: She is alert and oriented to person, place, and time.  Psychiatric:        Mood and Affect: Mood normal.        Behavior: Behavior normal.     BP 134/76 (BP Location: Right Arm)   Pulse (!) 105   Ht 5\' 2"  (1.575 m)   Wt 179 lb 6.4 oz (81.4 kg)   LMP 05/21/1997   SpO2 96%   BMI 32.81 kg/m  Wt Readings from Last 3 Encounters:  12/03/22 179 lb 6.4 oz (81.4 kg)  10/31/22 176 lb (79.8 kg)  10/30/22 173 lb (78.5 kg)    Lab Results  Component Value Date   TSH 2.790 04/30/2022   Lab Results  Component Value Date   WBC 6.8 08/21/2022   HGB 12.8 08/21/2022   HCT 39.9 08/21/2022   MCV 94.8 08/21/2022   PLT 190 08/21/2022   Lab Results  Component Value Date   NA 141 12/03/2022   K 3.1 (L) 12/03/2022   CO2 27 12/03/2022   GLUCOSE 111 (H) 12/03/2022   BUN 11 12/03/2022   CREATININE 0.83 12/03/2022   BILITOT 0.2 12/03/2022   ALKPHOS 90 12/03/2022   AST 16 12/03/2022   ALT 13 12/03/2022   PROT 6.8  12/03/2022   ALBUMIN 4.1 12/03/2022   CALCIUM 9.6 12/03/2022   ANIONGAP 8 08/21/2022   EGFR 83 12/03/2022   Lab Results  Component Value Date   CHOL 232 (H) 04/30/2022   Lab Results  Component Value Date   HDL 51 04/30/2022   Lab Results  Component Value Date   LDLCALC 154 (H) 04/30/2022   Lab Results  Component Value Date   TRIG 149 04/30/2022   Lab Results  Component Value Date   CHOLHDL 4.5 (H) 04/30/2022   Lab Results  Component Value Date   HGBA1C 5.8 (H) 04/30/2022      Assessment & Plan:   Problem List Items Addressed This Visit       Cardiovascular and Mediastinum   Essential hypertension - Primary (Chronic)    BP Readings from Last 1 Encounters:  12/04/22 135/85   Well-controlled with chlorthalidone 25 mg QD and amlodipine 10 mg every day- needs to clarify if she takes amlodipine Switched from HCTZ to chlorthalidone, helped with resistant leg swelling Counseled for compliance with the medications Advised DASH diet and moderate exercise/walking, at least 150 mins/week      Relevant Orders   CMP14+EGFR (Completed)     Respiratory   OSA (obstructive sleep apnea)    Has snoring and chronic fatigue STOP-BANG: 5 At high risk of having OSA Home sleep study ordered        Digestive   GERD (gastroesophageal reflux disease)    H/o gastritis On Pantoprazole 40 mg QD        Other   Hypokalemia    Due to diuretic use Switched from Klor-con tablets to potassium chloride solution due to swallowing difficulty      Relevant Medications   potassium chloride 20 MEQ/15ML (10%) SOLN   Polyarthralgia    Has chronic low back, shoulder, hip and knee pain Check ANA and RF considering her family history of rheumatoid arthritis Takes tramadol as needed for severe pain Followed by orthopedic surgery for DDD of lumbar spine and hip arthritis      Relevant Orders   ANA w/Reflex (Completed)   Rheumatoid Factor (Completed)    Meds ordered this encounter   Medications   potassium chloride 20 MEQ/15ML (10%) SOLN    Sig: Take 15 mLs (20 mEq total) by mouth daily.    Dispense:  473 mL    Refill:  5    Follow-up: Return in about 4 months (around 04/05/2023) for Annual physical.    Anabel Halon, MD

## 2022-12-03 NOTE — Assessment & Plan Note (Signed)
H/o gastritis On Pantoprazole 40 mg QD

## 2022-12-04 ENCOUNTER — Encounter: Payer: Self-pay | Admitting: Internal Medicine

## 2022-12-04 ENCOUNTER — Ambulatory Visit (INDEPENDENT_AMBULATORY_CARE_PROVIDER_SITE_OTHER): Payer: BC Managed Care – PPO | Admitting: Orthopedic Surgery

## 2022-12-04 ENCOUNTER — Other Ambulatory Visit: Payer: Self-pay | Admitting: Internal Medicine

## 2022-12-04 ENCOUNTER — Encounter: Payer: Self-pay | Admitting: Orthopedic Surgery

## 2022-12-04 VITALS — BP 135/85 | HR 106

## 2022-12-04 DIAGNOSIS — I10 Essential (primary) hypertension: Secondary | ICD-10-CM

## 2022-12-04 DIAGNOSIS — M19012 Primary osteoarthritis, left shoulder: Secondary | ICD-10-CM | POA: Diagnosis not present

## 2022-12-04 LAB — CMP14+EGFR: Potassium: 3.1 mmol/L — ABNORMAL LOW (ref 3.5–5.2)

## 2022-12-04 MED ORDER — AMLODIPINE BESYLATE 10 MG PO TABS: 10.0000 mg | ORAL_TABLET | Freq: Every day | ORAL | 1 refills | Status: DC

## 2022-12-04 NOTE — Patient Instructions (Signed)
Please provide a note for work, excusing her for today's visit.   We will obtain a CT scan of your left shoulder for preoperative templating.  Once the CT scan has been scheduled, please let us know, so we can work to schedule a surgical date.   Your surgery will be at [facility], scheduled with Dr Thane Edu   The hospital will contact you with a preoperative appointment to discuss Anesthesia. The phone number is [phone]   Please bring your medications with you for the appointment.  They will tell you the arrival time and medication instructions when you have your preoperative evaluation.  Do not wear nail polish the day of your surgery and if you take Phentermine you need to stop this medication ONE WEEK prior to your surgery.    If you take an blood thinning medication, we will need to stop this prior to surgery.  Typically, we stop this medicine at least 5 days prior to surgery.  We will need to confirm this with the doctor who prescribes this medication.  If you are taking medications or an injection for diabetes, or for weight management, this medicine will need to be stopped at least 7 days prior to surgery.     Surgery date is to be determined.  We will then seek authorization by your insurance company.

## 2022-12-04 NOTE — Progress Notes (Signed)
Orthopaedic Clinic Return  Assessment: [name] [name] is a 57 y.o. female with the following: Left glenohumeral joint arthritis   Plan: [name] [name] has advanced arthritis in the left glenohumeral joint.  We obtained an MRI, which confirms this diagnosis.  In addition, the rotator cuff tendons are intact.  She does have irritation of the supraspinatus and infraspinatus, but again there are no injuries to these tendons.  As a result, we can proceed with an anatomic total shoulder arthroplasty.  The surgery was discussed in detail.  All questions have been answered.  I will now obtain a CT scan of the left shoulder for preoperative templating.  This will allow [place] to have a surgical plan, which is personalized to the patient's anatomy.  We will confirm medical clearance with her primary care physician.  Risks and benefits of the surgery, including, but not limited to infection, bleeding, persistent pain, need for further surgery, non-union damage to surrounding structures, stiffness and more severe complications associated with anesthesia were discussed with the patient.  The patient has elected to proceed.  Surgery will be scheduled once we have completed the CT scan.   Follow-up: No follow-ups on file.   Subjective:  Chief Complaint  Patient presents with   Results    MRI Results    History of Present Illness: [name] [name] is a 57 y.o. female who returns to clinic for evaluation of her left shoulder pain.  She has known left shoulder glenohumeral joint arthritis.  She has worked with physical therapy.  She has had multiple injections in the left shoulder.  Radiographs, and now an MRI confirmed severe left shoulder glenohumeral joint arthritis.  She is here to discuss the findings of her MRI.  She continues to have pain.  Difficulty with overhead motion.  Pain keeps her up at night.  She is ready to consider surgery.  Review of Systems: No fevers or chills Occasional numbness or  tingling No chest pain No shortness of breath No bowel or bladder dysfunction No GI distress No headaches   Objective: BP 135/85   Pulse (!) 106   LMP 05/21/1997   Physical Exam:  Alert and oriented.  No acute distress.  Left shoulder without deformity.  No swelling.  No bruising.  Active motion limited forward flexion abduction limited to shoulder height.  Passive forward flexion to 110 degrees.  Abduction to 90 degrees.  External rotation is 45 degrees.  Positive Jobe's.  4+/5 strength in the supraspinatus and infraspinatus.  Negative belly press.   IMAGING: I personally ordered and reviewed the following images:  Left shoulder MRI  IMPRESSION: 1. Moderate tendinosis of the supraspinatus tendon. 2. Severe tendinosis of the infraspinatus tendon. 3. Severe osteoarthritis of the left glenohumeral joint.  Oliver Barre, MD 12/04/2022 9:53 AM

## 2022-12-05 ENCOUNTER — Other Ambulatory Visit: Payer: Self-pay | Admitting: Internal Medicine

## 2022-12-05 DIAGNOSIS — E782 Mixed hyperlipidemia: Secondary | ICD-10-CM

## 2022-12-05 DIAGNOSIS — M2550 Pain in unspecified joint: Secondary | ICD-10-CM | POA: Insufficient documentation

## 2022-12-05 LAB — CMP14+EGFR
ALT: 13 IU/L (ref 0–32)
AST: 16 IU/L (ref 0–40)
Albumin: 4.1 g/dL (ref 3.8–4.9)
Alkaline Phosphatase: 90 IU/L (ref 44–121)
BUN/Creatinine Ratio: 13 (ref 9–23)
BUN: 11 mg/dL (ref 6–24)
Bilirubin Total: 0.2 mg/dL (ref 0.0–1.2)
CO2: 27 mmol/L (ref 20–29)
Calcium: 9.6 mg/dL (ref 8.7–10.2)
Chloride: 99 mmol/L (ref 96–106)
Creatinine, Ser: 0.83 mg/dL (ref 0.57–1.00)
Globulin, Total: 2.7 g/dL (ref 1.5–4.5)
Glucose: 111 mg/dL — ABNORMAL HIGH (ref 70–99)
Sodium: 141 mmol/L (ref 134–144)
Total Protein: 6.8 g/dL (ref 6.0–8.5)
eGFR: 83 mL/min/{1.73_m2} (ref >59)

## 2022-12-05 LAB — ANA W/REFLEX: Anti Nuclear Antibody (ANA): NEGATIVE

## 2022-12-05 LAB — RHEUMATOID FACTOR: Rheumatoid fact SerPl-aCnc: 10.6 IU/mL (ref <14.0)

## 2022-12-05 NOTE — Assessment & Plan Note (Signed)
Due to diuretic use Switched from Klor-con tablets to potassium chloride solution due to swallowing difficulty

## 2022-12-05 NOTE — Assessment & Plan Note (Addendum)
Has chronic low back, shoulder, hip and knee pain Check ANA and RF considering her family history of rheumatoid arthritis Takes tramadol as needed for severe pain Followed by orthopedic surgery for DDD of lumbar spine and hip arthritis

## 2022-12-05 NOTE — Assessment & Plan Note (Signed)
Has snoring and chronic fatigue STOP-BANG: 5 At high risk of having OSA Home sleep study ordered

## 2022-12-05 NOTE — Assessment & Plan Note (Signed)
BP Readings from Last 1 Encounters:  12/04/22 135/85   Well-controlled with chlorthalidone 25 mg QD and amlodipine 10 mg every day- needs to clarify if she takes amlodipine Switched from HCTZ to chlorthalidone, helped with resistant leg swelling Counseled for compliance with the medications Advised DASH diet and moderate exercise/walking, at least 150 mins/week

## 2022-12-11 ENCOUNTER — Ambulatory Visit (HOSPITAL_COMMUNITY): Payer: BC Managed Care – PPO | Admitting: Anesthesiology

## 2022-12-12 ENCOUNTER — Other Ambulatory Visit: Payer: BC Managed Care – PPO

## 2022-12-18 NOTE — Patient Instructions (Signed)
[name] [name]  12/18/2022     @PREFPERIOPPHARMACY @   Your procedure is scheduled on  12/31/2022.   Report to [facility] at  0600  A.M.   Call this number if you have problems the morning of surgery:  [phone]  If you experience any cold or flu symptoms such as cough, fever, chills, shortness of breath, etc. between now and your scheduled surgery, please notify us at the above number.   Remember:  Do not eat after midnight.    You may drink clear liquids until  0330 am on 12/31/2022.    Clear liquids allowed are:                    Water, Juice (No red color; non-citric and without pulp; diabetics please choose diet or no sugar options), Carbonated beverages (diabetics please choose diet or no sugar options), Clear Tea (No creamer, milk, or cream, including half & half and powdered creamer), Black Coffee Only (No creamer, milk or cream, including half & half and powdered creamer), Plain Jell-O Only (No red color; diabetics please choose no sugar options), Clear Sports drink (No red color; diabetics please choose diet or no sugar options), and Plain Popsicles Only (No red color; diabetics please choose no sugar options)       At 0330 am on 12/31/2022 drink your carb drink. You can have nothing else to drink after this.       Use your inhaler before you come and bring your rescue inhaler with you.  Take these medicines the morning of surgery with A SIP OF WATER        xanax(if needed), amlodipine, zyrtec, flexeril(if needed), pantoprazole, tramadol(if needed).     Do not wear jewelry, make-up or nail polish, including gel polish,  artificial nails, or any other type of covering on natural nails (fingers and  toes).  Do not wear lotions, powders, or perfumes, or deodorant.  Do not shave 48 hours prior to surgery.  Men may shave face and neck.  Do not bring valuables to the hospital.  [facility] is not responsible for any belongings or valuables.  Contacts, dentures  or bridgework may not be worn into surgery.  Leave your suitcase in the car.  After surgery it may be brought to your room.  For patients admitted to the hospital, discharge time will be determined by your treatment team.  Patients discharged the day of surgery will not be allowed to drive home and must have someone with them for 24 hours.     Special instructions:   DO NOT smoke tobacco or vape for 24 hours before your procedure.  Please read over the following fact sheets that you were given. Pain Booklet, Coughing and Deep Breathing, Surgical Site Infection Prevention, Anesthesia Post-op Instructions, and Care and Recovery After Surgery        Shoulder Replacement, Care After This sheet gives you information about how to care for yourself after your procedure. Your health care provider may also give you more specific instructions. If you have problems or questions, contact your health care provider. What can I expect after the procedure? After the procedure, it is common to have: A bruised and stiff shoulder and arm. Some shoulder and arm pain. Follow these instructions at home: Medicines Take over-the-counter and prescription medicines only as told by your health care provider. If you were prescribed an antibiotic medicine, use it as told by your health care  provider. Do not stop using the antibiotic even if you start to feel better. Ask your health care provider if the medicine prescribed to you can cause constipation. You may need to take these actions to prevent or treat constipation: Drink enough fluid to keep your urine pale yellow. Take over-the-counter or prescription medicines. Eat foods that are high in fiber, such as beans, whole grains, and fresh fruits and vegetables. Limit foods that are high in fat and processed sugars, such as fried or sweet foods. If you have a sling or immobilizer:  Wear the sling or immobilizer as told by your health care provider. Remove it  only as told by your health care provider. Loosen the sling or immobilizer if your fingers tingle, become numb, or turn cold and blue. Keep the sling or immobilizer clean and dry. Bathing Do not take baths, swim, or use a hot tub until your health care provider approves. Ask your health care provider if you can take showers. You may only be allowed to take sponge baths. If your sling or immobilizer is not waterproof: Do not let it get wet. Cover it with a watertight covering when you take a bath or a shower. Keep the bandage (dressing) dry until your health care provider says it can be removed. Incision care  Follow instructions from your health care provider about how to take care of your incision. Make sure you: Wash your hands with soap and water for at least 20 seconds before and after you change your dressing. If soap and water are not available, use hand sanitizer. Change your dressing as told by your health care provider. Leave staples, stitches (sutures), skin glue, or adhesive strips in place. These skin closures may need to stay in place for 2 weeks or longer. If adhesive strip edges start to loosen and curl up, you may trim the loose edges. Do not remove adhesive strips completely unless your health care provider tells you to do that. If you have a tube to remove drainage, follow instructions from your health care provider about caring for it. Do not remove the drain tube or any dressings around the tube opening unless your health care provider approves. Check your incision area every day for signs of infection. Check for: More redness, swelling, or pain. More fluid or blood. Warmth. Pus or a bad smell. Managing pain, stiffness, and swelling  If directed, put ice on the affected area. To do this: If you have a removable sling, remove it as told by your health care provider. Put ice in a plastic bag. Place a towel between your skin and the bag. Leave the ice on for 20 minutes,  2-3 times a day. Remove the ice if your skin turns bright red. This is very important. If you cannot feel pain, heat, or cold, you have a greater risk of damage to the area. If you have an icing device, use it as directed by your health care provider. Move your fingers and elbow often to reduce stiffness and swelling. Activity Do not use your arm to push yourself up in bed or from a chair. Follow lifting restrictions as told: Do not lift anything that is heavier than a cup of coffee for the first 6 weeks after surgery, or as told by your health care provider. Do not lift anything that is heavier than 10 lb (4.5 kg), or the limit that you are told, for 6 months or until your health care provider says that it is safe.  Do exercises, including physical therapy, as told by your health care provider. Try not to overuse your shoulder. This includes repetitive pushing or pulling. Early overuse of the shoulder may result in later problems. (Overusing the shoulder is easy to do when your pain goes away for the first time.) Avoid overstretching your arm for 6 weeks after surgery, or as told by your health care provider. Avoid sitting for a long time without moving. Get up to take short walks every 1-2 hours. This is important to improve blood flow and breathing. Ask for help if you feel weak or unsteady. Ask for help with some activities. Your health care provider may be able to suggest a clinic or agency for this if you do not have home support. Do not participate in contact sports. Driving Ask your health care provider if the medicine prescribed to you requires you to avoid driving or using machinery. Do not drive for 2-4 weeks after surgery or as told by your health care provider. General instructions Tell your health care provider if you plan to have dental work. Also: Tell your dentist about your joint replacement. Ask your health care provider if there are any special instructions you need to follow  before having dental care and routine cleanings. Do not use any products that contain nicotine or tobacco, such as cigarettes, e-cigarettes, and chewing tobacco. These can delay healing. If you need help quitting, ask your health care provider. Keep all follow-up visits. This is important. Contact a health care provider if: You develop a rash. You have a fever. You have any of these signs of infection in your incision area: More redness, swelling, or pain. More fluid or blood. Warmth. Pus or a bad smell. Get help right away if: The edges of the incision site break open after sutures have been removed. You have redness, swelling, pain, or warmth in your leg or arm. You have chest pain or shortness of breath. These symptoms may represent a serious problem that is an emergency. Do not wait to see if the symptoms will go away. Get medical help right away. Call your local emergency services (911 in the [place] Do not drive yourself to the hospital. Summary It is common to have pain and stiffness in your shoulder and arm after the procedure. Put ice on the affected area and take pain medicine as told by your health care provider. Do not use your arm to push yourself up in bed or from a chair. Do exercises, including physical therapy, as told by your health care provider. Check your incision area daily. Call your health care provider if you see signs of infection. This information is not intended to replace advice given to you by your health care provider. Make sure you discuss any questions you have with your health care provider. Document Revised: 10/21/2019 Document Reviewed: 10/21/2019 Elsevier Patient Education  2024 [employer] General Anesthesia, Adult, Care After The following information offers guidance on how to care for yourself after your procedure. Your health care provider may also give you more specific instructions. If you have problems or questions, contact your health care  provider. What can I expect after the procedure? After the procedure, it is common for people to: Have pain or discomfort at the IV site. Have nausea or vomiting. Have a sore throat or hoarseness. Have trouble concentrating. Feel cold or chills. Feel weak, sleepy, or tired (fatigue). Have soreness and body aches. These can affect parts of the body that were not  involved in surgery. Follow these instructions at home: For the time period you were told by your health care provider:  Rest. Do not participate in activities where you could fall or become injured. Do not drive or use machinery. Do not drink alcohol. Do not take sleeping pills or medicines that cause drowsiness. Do not make important decisions or sign legal documents. Do not take care of children on your own. General instructions Drink enough fluid to keep your urine pale yellow. If you have sleep apnea, surgery and certain medicines can increase your risk for breathing problems. Follow instructions from your health care provider about wearing your sleep device: Anytime you are sleeping, including during daytime naps. While taking prescription pain medicines, sleeping medicines, or medicines that make you drowsy. Return to your normal activities as told by your health care provider. Ask your health care provider what activities are safe for you. Take over-the-counter and prescription medicines only as told by your health care provider. Do not use any products that contain nicotine or tobacco. These products include cigarettes, chewing tobacco, and vaping devices, such as e-cigarettes. These can delay incision healing after surgery. If you need help quitting, ask your health care provider. Contact a health care provider if: You have nausea or vomiting that does not get better with medicine. You vomit every time you eat or drink. You have pain that does not get better with medicine. You cannot urinate or have bloody urine. You  develop a skin rash. You have a fever. Get help right away if: You have trouble breathing. You have chest pain. You vomit blood. These symptoms may be an emergency. Get help right away. Call 911. Do not wait to see if the symptoms will go away. Do not drive yourself to the hospital. Summary After the procedure, it is common to have a sore throat, hoarseness, nausea, vomiting, or to feel weak, sleepy, or fatigue. For the time period you were told by your health care provider, do not drive or use machinery. Get help right away if you have difficulty breathing, have chest pain, or vomit blood. These symptoms may be an emergency. This information is not intended to replace advice given to you by your health care provider. Make sure you discuss any questions you have with your health care provider. Document Revised: 08/04/2021 Document Reviewed: 08/04/2021 Elsevier Patient Education  2024 [employer]. How to Use Chlorhexidine Before Surgery Chlorhexidine gluconate (CHG) is a germ-killing (antiseptic) solution that is used to clean the skin. It can get rid of the bacteria that normally live on the skin and can keep them away for about 24 hours. To clean your skin with CHG, you may be given: A CHG solution to use in the shower or as part of a sponge bath. A prepackaged cloth that contains CHG. Cleaning your skin with CHG may help lower the risk for infection: While you are staying in the intensive care unit of the hospital. If you have a vascular access, such as a central line, to provide short-term or long-term access to your veins. If you have a catheter to drain urine from your bladder. If you are on a ventilator. A ventilator is a machine that helps you breathe by moving air in and out of your lungs. After surgery. What are the risks? Risks of using CHG include: A skin reaction. Hearing loss, if CHG gets in your ears and you have a perforated eardrum. Eye injury, if CHG gets in your eyes  and  is not rinsed out. The CHG product catching fire. Make sure that you avoid smoking and flames after applying CHG to your skin. Do not use CHG: If you have a chlorhexidine allergy or have previously reacted to chlorhexidine. On babies younger than 80 months of age. How to use CHG solution Use CHG only as told by your health care provider, and follow the instructions on the label. Use the full amount of CHG as directed. Usually, this is one bottle. During a shower Follow these steps when using CHG solution during a shower (unless your health care provider gives you different instructions): Start the shower. Use your normal soap and shampoo to wash your face and hair. Turn off the shower or move out of the shower stream. Pour the CHG onto a clean washcloth. Do not use any type of brush or rough-edged sponge. Starting at your neck, lather your body down to your toes. Make sure you follow these instructions: If you will be having surgery, pay special attention to the part of your body where you will be having surgery. Scrub this area for at least 1 minute. Do not use CHG on your head or face. If the solution gets into your ears or eyes, rinse them well with water. Avoid your genital area. Avoid any areas of skin that have broken skin, cuts, or scrapes. Scrub your back and under your arms. Make sure to wash skin folds. Let the lather sit on your skin for 1-2 minutes or as long as told by your health care provider. Thoroughly rinse your entire body in the shower. Make sure that all body creases and crevices are rinsed well. Dry off with a clean towel. Do not put any substances on your body afterward--such as powder, lotion, or perfume--unless you are told to do so by your health care provider. Only use lotions that are recommended by the manufacturer. Put on clean clothes or pajamas. If it is the night before your surgery, sleep in clean sheets.  During a sponge bath Follow these steps when  using CHG solution during a sponge bath (unless your health care provider gives you different instructions): Use your normal soap and shampoo to wash your face and hair. Pour the CHG onto a clean washcloth. Starting at your neck, lather your body down to your toes. Make sure you follow these instructions: If you will be having surgery, pay special attention to the part of your body where you will be having surgery. Scrub this area for at least 1 minute. Do not use CHG on your head or face. If the solution gets into your ears or eyes, rinse them well with water. Avoid your genital area. Avoid any areas of skin that have broken skin, cuts, or scrapes. Scrub your back and under your arms. Make sure to wash skin folds. Let the lather sit on your skin for 1-2 minutes or as long as told by your health care provider. Using a different clean, wet washcloth, thoroughly rinse your entire body. Make sure that all body creases and crevices are rinsed well. Dry off with a clean towel. Do not put any substances on your body afterward--such as powder, lotion, or perfume--unless you are told to do so by your health care provider. Only use lotions that are recommended by the manufacturer. Put on clean clothes or pajamas. If it is the night before your surgery, sleep in clean sheets. How to use CHG prepackaged cloths Only use CHG cloths as told by your health  care provider, and follow the instructions on the label. Use the CHG cloth on clean, dry skin. Do not use the CHG cloth on your head or face unless your health care provider tells you to. When washing with the CHG cloth: Avoid your genital area. Avoid any areas of skin that have broken skin, cuts, or scrapes. Before surgery Follow these steps when using a CHG cloth to clean before surgery (unless your health care provider gives you different instructions): Using the CHG cloth, vigorously scrub the part of your body where you will be having surgery. Scrub  using a back-and-forth motion for 3 minutes. The area on your body should be completely wet with CHG when you are done scrubbing. Do not rinse. Discard the cloth and let the area air-dry. Do not put any substances on the area afterward, such as powder, lotion, or perfume. Put on clean clothes or pajamas. If it is the night before your surgery, sleep in clean sheets.  For general bathing Follow these steps when using CHG cloths for general bathing (unless your health care provider gives you different instructions). Use a separate CHG cloth for each area of your body. Make sure you wash between any folds of skin and between your fingers and toes. Wash your body in the following order, switching to a new cloth after each step: The front of your neck, shoulders, and chest. Both of your arms, under your arms, and your hands. Your stomach and groin area, avoiding the genitals. Your right leg and foot. Your left leg and foot. The back of your neck, your back, and your buttocks. Do not rinse. Discard the cloth and let the area air-dry. Do not put any substances on your body afterward--such as powder, lotion, or perfume--unless you are told to do so by your health care provider. Only use lotions that are recommended by the manufacturer. Put on clean clothes or pajamas. Contact a health care provider if: Your skin gets irritated after scrubbing. You have questions about using your solution or cloth. You swallow any chlorhexidine. Call your local poison control center (916-638-3288 in the [place] Get help right away if: Your eyes itch badly, or they become very red or swollen. Your skin itches badly and is red or swollen. Your hearing changes. You have trouble seeing. You have swelling or tingling in your mouth or throat. You have trouble breathing. These symptoms may represent a serious problem that is an emergency. Do not wait to see if the symptoms will go away. Get medical help right away. Call  your local emergency services (911 in the [place] Do not drive yourself to the hospital. Summary Chlorhexidine gluconate (CHG) is a germ-killing (antiseptic) solution that is used to clean the skin. Cleaning your skin with CHG may help to lower your risk for infection. You may be given CHG to use for bathing. It may be in a bottle or in a prepackaged cloth to use on your skin. Carefully follow your health care provider's instructions and the instructions on the product label. Do not use CHG if you have a chlorhexidine allergy. Contact your health care provider if your skin gets irritated after scrubbing. This information is not intended to replace advice given to you by your health care provider. Make sure you discuss any questions you have with your health care provider. Document Revised: 09/04/2021 Document Reviewed: 07/18/2020 Elsevier Patient Education  2023 [employer].

## 2022-12-19 ENCOUNTER — Other Ambulatory Visit: Payer: Self-pay | Admitting: Orthopedic Surgery

## 2022-12-19 ENCOUNTER — Encounter (HOSPITAL_COMMUNITY): Payer: Self-pay

## 2022-12-19 ENCOUNTER — Encounter (HOSPITAL_COMMUNITY)
Admission: RE | Admit: 2022-12-19 | Discharge: 2022-12-19 | Disposition: A | Discharge status: Home / Self Care | Payer: BC Managed Care – PPO | Source: Ambulatory Visit | Attending: Orthopedic Surgery | Admitting: Orthopedic Surgery

## 2022-12-19 VITALS — BP 135/85 | HR 100 | Temp 97.8°F | Resp 18 | Ht 62.0 in | Wt 179.4 lb

## 2022-12-19 DIAGNOSIS — Z01812 Encounter for preprocedural laboratory examination: Secondary | ICD-10-CM | POA: Insufficient documentation

## 2022-12-19 DIAGNOSIS — R7303 Prediabetes: Secondary | ICD-10-CM | POA: Insufficient documentation

## 2022-12-19 DIAGNOSIS — Z01818 Encounter for other preprocedural examination: Secondary | ICD-10-CM

## 2022-12-19 LAB — BASIC METABOLIC PANEL
Anion gap: 15 (ref 5–15)
BUN: 16 mg/dL (ref 6–20)
CO2: 27 mmol/L (ref 22–32)
Calcium: 9.8 mg/dL (ref 8.9–10.3)
Chloride: 96 mmol/L — ABNORMAL LOW (ref 98–111)
Creatinine, Ser: 0.81 mg/dL (ref 0.44–1.00)
GFR, Estimated: >60 mL/min (ref >60)
Glucose, Bld: 109 mg/dL — ABNORMAL HIGH (ref 70–99)
Potassium: 2.7 mmol/L — CL (ref 3.5–5.1)
Sodium: 138 mmol/L (ref 135–145)

## 2022-12-19 LAB — SURGICAL PCR SCREEN
MRSA, PCR: NEGATIVE
Staphylococcus aureus: NEGATIVE

## 2022-12-19 LAB — CBC
HCT: 40.8 % (ref 36.0–46.0)
Hemoglobin: 13.6 g/dL (ref 12.0–15.0)
MCH: 29.6 pg (ref 26.0–34.0)
MCHC: 33.3 g/dL (ref 30.0–36.0)
MCV: 88.7 fL (ref 80.0–100.0)
Platelets: 226 10*3/uL (ref 150–400)
RBC: 4.6 MIL/uL (ref 3.87–5.11)
RDW: 12.8 % (ref 11.5–15.5)
WBC: 4.9 10*3/uL (ref 4.0–10.5)
nRBC: 0 % (ref 0.0–0.2)

## 2022-12-19 MED ORDER — POTASSIUM CHLORIDE CRYS ER 20 MEQ PO TBCR: 20.0000 meq | EXTENDED_RELEASE_TABLET | Freq: Two times a day (BID) | ORAL | 0 refills | Status: DC

## 2022-12-19 NOTE — Progress Notes (Signed)
Critical Potassium 2.7 this am.  Dr Dallas Schimke notified and will call in potassium supplements to the pharmacy. We will do an Istat on the day of the procedure.

## 2022-12-20 ENCOUNTER — Ambulatory Visit (HOSPITAL_COMMUNITY)
Admission: RE | Admit: 2022-12-20 | Discharge: 2022-12-20 | Disposition: A | Discharge status: Home / Self Care | Payer: BC Managed Care – PPO | Source: Ambulatory Visit | Attending: Orthopedic Surgery | Admitting: Orthopedic Surgery

## 2022-12-20 DIAGNOSIS — M19012 Primary osteoarthritis, left shoulder: Secondary | ICD-10-CM | POA: Insufficient documentation

## 2022-12-20 DIAGNOSIS — M25512 Pain in left shoulder: Secondary | ICD-10-CM | POA: Diagnosis not present

## 2022-12-20 DIAGNOSIS — Z01818 Encounter for other preprocedural examination: Secondary | ICD-10-CM | POA: Diagnosis not present

## 2022-12-28 ENCOUNTER — Encounter: Payer: Self-pay | Admitting: Internal Medicine

## 2022-12-31 ENCOUNTER — Encounter: Payer: Self-pay | Admitting: Orthopedic Surgery

## 2022-12-31 ENCOUNTER — Encounter (HOSPITAL_COMMUNITY)
Admission: RE | Disposition: A | Discharge status: Home / Self Care | Payer: Self-pay | Source: Home / Self Care | Attending: Orthopedic Surgery

## 2022-12-31 ENCOUNTER — Ambulatory Visit (HOSPITAL_COMMUNITY)
Admission: RE | Admit: 2022-12-31 | Discharge: 2022-12-31 | Disposition: A | Discharge status: Home / Self Care | Payer: BC Managed Care – PPO | Attending: Orthopedic Surgery | Admitting: Orthopedic Surgery

## 2022-12-31 ENCOUNTER — Telehealth: Payer: Self-pay | Admitting: Internal Medicine

## 2022-12-31 DIAGNOSIS — Z5309 Procedure and treatment not carried out because of other contraindication: Secondary | ICD-10-CM | POA: Diagnosis not present

## 2022-12-31 DIAGNOSIS — E876 Hypokalemia: Secondary | ICD-10-CM

## 2022-12-31 DIAGNOSIS — M19012 Primary osteoarthritis, left shoulder: Secondary | ICD-10-CM | POA: Diagnosis not present

## 2022-12-31 DIAGNOSIS — Z96612 Presence of left artificial shoulder joint: Secondary | ICD-10-CM

## 2022-12-31 DIAGNOSIS — Z01818 Encounter for other preprocedural examination: Secondary | ICD-10-CM

## 2022-12-31 LAB — BASIC METABOLIC PANEL
Anion gap: 9 (ref 5–15)
BUN: 8 mg/dL (ref 6–20)
CO2: 26 mmol/L (ref 22–32)
Calcium: 8.8 mg/dL — ABNORMAL LOW (ref 8.9–10.3)
Chloride: 101 mmol/L (ref 98–111)
Creatinine, Ser: 0.7 mg/dL (ref 0.44–1.00)
GFR, Estimated: >60 mL/min (ref >60)
Glucose, Bld: 95 mg/dL (ref 70–99)
Potassium: 2.8 mmol/L — ABNORMAL LOW (ref 3.5–5.1)
Sodium: 136 mmol/L (ref 135–145)

## 2022-12-31 LAB — POCT I-STAT, CHEM 8
BUN: 6 mg/dL (ref 6–20)
Calcium, Ion: 1.2 mmol/L (ref 1.15–1.40)
Chloride: 101 mmol/L (ref 98–111)
Creatinine, Ser: 0.7 mg/dL (ref 0.44–1.00)
Glucose, Bld: 95 mg/dL (ref 70–99)
HCT: 34 % — ABNORMAL LOW (ref 36.0–46.0)
Hemoglobin: 11.6 g/dL — ABNORMAL LOW (ref 12.0–15.0)
Potassium: 2.9 mmol/L — ABNORMAL LOW (ref 3.5–5.1)
Sodium: 140 mmol/L (ref 135–145)
TCO2: 26 mmol/L (ref 22–32)

## 2022-12-31 SURGERY — TOTAL SHOULDER ARTHROPLASTY
Anesthesia: General | Site: Shoulder | Laterality: Left

## 2022-12-31 MED ORDER — ORAL CARE MOUTH RINSE: 15.0000 mL | Freq: Once | OROMUCOSAL | Status: AC

## 2022-12-31 MED ORDER — BUPIVACAINE HCL (PF) 0.5 % IJ SOLN
INTRAMUSCULAR | Status: AC
  Filled 2022-12-31: qty 30

## 2022-12-31 MED ORDER — LACTATED RINGERS IV SOLN: INTRAVENOUS | Status: DC

## 2022-12-31 MED ORDER — VANCOMYCIN HCL 1000 MG IV SOLR
INTRAVENOUS | Status: AC
  Filled 2022-12-31: qty 20

## 2022-12-31 MED ORDER — TRANEXAMIC ACID-NACL 1000-0.7 MG/100ML-% IV SOLN: 1000.0000 mg | INTRAVENOUS | Status: DC

## 2022-12-31 MED ORDER — LACTATED RINGERS IV BOLUS
1000.0000 mL | Freq: Once | INTRAVENOUS | Status: AC
  Administered 2022-12-31: 1000 mL via INTRAVENOUS

## 2022-12-31 MED ORDER — BUPIVACAINE-EPINEPHRINE (PF) 0.5% -1:200000 IJ SOLN
INTRAMUSCULAR | Status: AC
  Filled 2022-12-31: qty 30

## 2022-12-31 MED ORDER — BUPIVACAINE LIPOSOME 1.3 % IJ SUSP
INTRAMUSCULAR | Status: AC
  Filled 2022-12-31: qty 10

## 2022-12-31 MED ORDER — MIDAZOLAM HCL 2 MG/2ML IJ SOLN: 2.0000 mg | Freq: Once | INTRAMUSCULAR | Status: DC

## 2022-12-31 MED ORDER — CHLORHEXIDINE GLUCONATE 0.12 % MT SOLN
15.0000 mL | Freq: Once | OROMUCOSAL | Status: AC
  Administered 2022-12-31: 15 mL via OROMUCOSAL

## 2022-12-31 MED ORDER — LIDOCAINE HCL (PF) 1 % IJ SOLN
INTRAMUSCULAR | Status: AC
  Filled 2022-12-31: qty 30

## 2022-12-31 MED ORDER — SEVOFLURANE IN SOLN
RESPIRATORY_TRACT | Status: AC
  Filled 2022-12-31: qty 250

## 2022-12-31 MED ORDER — CEFAZOLIN SODIUM-DEXTROSE 2-4 GM/100ML-% IV SOLN: 2.0000 g | INTRAVENOUS | Status: DC

## 2022-12-31 NOTE — Progress Notes (Signed)
  December 31, 2022  Patient: [name] [name]  Date of Birth: August 20, 1965  Date of Visit: 12/10/2022    To Whom It May Concern:  Tyra Raisanen was seen and treated in our Short Stay [facility] on 12/10/2022.    Shaney Agustin was her caretaker during Britiny's stay.  Please excuse him from work today, Monday, December 31, 2022.    Sincerely,  Daphane Shepherd, RN [facility] Short Stay

## 2022-12-31 NOTE — Telephone Encounter (Signed)
Patient calling says she went to have surgery this morning and she was unable due to her medications making her potassium too high. Please advise Thank you

## 2022-12-31 NOTE — Anesthesia Preprocedure Evaluation (Deleted)
Anesthesia Evaluation  Patient identified by MRN, date of birth, ID band Patient awake    Reviewed: Allergy & Precautions, H&P , NPO status , Patient's Chart, lab work & pertinent test results  Airway Mallampati: II  TM Distance: >3 FB Neck ROM: Full    Dental  (+) Dental Advisory Given, Chipped   Pulmonary sleep apnea , former smoker   Pulmonary exam normal breath sounds clear to auscultation       Cardiovascular Exercise Tolerance: Good hypertension, Pt. on medications Normal cardiovascular exam Rhythm:Regular Rate:Normal     Neuro/Psych  Headaches PSYCHIATRIC DISORDERS Anxiety Depression     Neuromuscular disease CVA (vision problems), Residual Symptoms    GI/Hepatic Neg liver ROS,GERD  Medicated and Poorly Controlled,,  Endo/Other  negative endocrine ROS    Renal/GU negative Renal ROS  negative genitourinary   Musculoskeletal  (+) Arthritis , Osteoarthritis,    Abdominal   Peds  (+) ADHD Hematology  (+) Blood dyscrasia, anemia   Anesthesia Other Findings   Reproductive/Obstetrics negative OB ROS                             Anesthesia Physical Anesthesia Plan  ASA: 3  Anesthesia Plan: General   Post-op Pain Management: Dilaudid IV and Regional block*   Induction: Intravenous  PONV Risk Score and Plan: 4 or greater and Ondansetron, Dexamethasone and Midazolam  Airway Management Planned: Oral ETT  Additional Equipment:   Intra-op Plan:   Post-operative Plan: Extubation in OR  Informed Consent: I have reviewed the patients History and Physical, chart, labs and discussed the procedure including the risks, benefits and alternatives for the proposed anesthesia with the patient or authorized representative who has indicated his/her understanding and acceptance.     Dental advisory given  Plan Discussed with: CRNA and Surgeon  Anesthesia Plan Comments: (Potassium levels  2.8, discussed with Dr. Dallas Schimke, case cancelled and will be rescheduled)       Anesthesia Quick Evaluation

## 2022-12-31 NOTE — Telephone Encounter (Signed)
Pt scheduled 8/15 3:40

## 2022-12-31 NOTE — Progress Notes (Signed)
[name] [name] presented for left shoulder arthroplasty this morning.  At her preop visit, her potassium was noted to be 2.7.  She was prescribed supplements and started taking them immediately.  She has been on potassium supplements for more than a week.  Repeat POC testing this morning was 2.9 and follow up BMP results demonstrate a potassium 2.8.  We will cancel today's surgery and plan to reschedule.    Plan was discussed with [name] and her husband.  All questions were answered.  She will contact the clinic with questions.  I will reach out to Dr. Allena Katz directly.    Odyn Turko A. Dallas Schimke, MD MS [facility] [address] [place],  [place]  [address] Phone: [phone] Fax: [phone]

## 2022-12-31 NOTE — Progress Notes (Signed)
Patient's K+ 2.7 during PAT testing, Placed on K+,  Patient stated she has been taking K+.   Recheck K+ 2.8.  Surgery cancelled for today

## 2023-01-01 ENCOUNTER — Telehealth: Payer: Self-pay | Admitting: Orthopedic Surgery

## 2023-01-01 NOTE — Telephone Encounter (Signed)
Dr. Dallas Schimke [name] [name] - [name] [name] lvm stating that she is checking on her surgery date due to her job and the situation there.  [phone]

## 2023-01-02 ENCOUNTER — Other Ambulatory Visit: Payer: Self-pay | Admitting: Orthopedic Surgery

## 2023-01-02 MED ORDER — TRAMADOL HCL 50 MG PO TABS: 50.0000 mg | ORAL_TABLET | Freq: Two times a day (BID) | ORAL | 0 refills | Status: DC | PRN

## 2023-01-02 MED ORDER — POTASSIUM CHLORIDE CRYS ER 20 MEQ PO TBCR: 20.0000 meq | EXTENDED_RELEASE_TABLET | Freq: Two times a day (BID) | ORAL | 0 refills | Status: DC

## 2023-01-02 NOTE — Telephone Encounter (Signed)
Dr. Dallas Schimke pt - pt called, she is wanting a return to work note for 01/04/23, ok to give?

## 2023-01-02 NOTE — Telephone Encounter (Signed)
Spoke with pt, she understands why surgery is postponed. Pt is having concerns with work accepting the circumstances she's currently in. Advised pt to talk to her department to see if there is any paperwork we can provide to assist. Pt will call her supervisor to inquire but she would like some assistance from our department to help with altering/updating paperwork.

## 2023-01-03 ENCOUNTER — Encounter: Payer: Self-pay | Admitting: Internal Medicine

## 2023-01-03 ENCOUNTER — Ambulatory Visit: Payer: BC Managed Care – PPO | Admitting: Internal Medicine

## 2023-01-03 VITALS — BP 142/84 | HR 107 | Ht 62.0 in | Wt 177.6 lb

## 2023-01-03 DIAGNOSIS — G4733 Obstructive sleep apnea (adult) (pediatric): Secondary | ICD-10-CM | POA: Diagnosis not present

## 2023-01-03 DIAGNOSIS — M19012 Primary osteoarthritis, left shoulder: Secondary | ICD-10-CM

## 2023-01-03 DIAGNOSIS — R600 Localized edema: Secondary | ICD-10-CM | POA: Diagnosis not present

## 2023-01-03 DIAGNOSIS — E876 Hypokalemia: Secondary | ICD-10-CM | POA: Diagnosis not present

## 2023-01-03 DIAGNOSIS — I10 Essential (primary) hypertension: Secondary | ICD-10-CM | POA: Diagnosis not present

## 2023-01-03 MED ORDER — SPIRONOLACTONE 25 MG PO TABS: 25.0000 mg | ORAL_TABLET | Freq: Every day | ORAL | 0 refills | Status: DC

## 2023-01-03 NOTE — Assessment & Plan Note (Signed)
Her shoulder arthroplasty was postponed due to hypokalemia -return to work letter provided Followed by orthopedic surgeon

## 2023-01-03 NOTE — Patient Instructions (Addendum)
Please start taking Spironolactone as prescribed.  Please start taking Magnesium supplement - 400 mg once daily.  Please stop taking Chlorthalidone as discussed for now.

## 2023-01-03 NOTE — Assessment & Plan Note (Signed)
Has snoring and chronic fatigue STOP-BANG: 5 At high risk of having OSA  Recently had home sleep study, which showed mild OSA.  Considering her symptoms, she would benefit from CPAP treatment.  She is agreeable with the plan.  CPAP order placed

## 2023-01-03 NOTE — Assessment & Plan Note (Addendum)
DC chlorthalidone due to hypokalemia Switched to spironolactone Leg elevation and compression socks as tolerated

## 2023-01-03 NOTE — Progress Notes (Signed)
Acute Office Visit  Subjective:    Patient ID: [name] [name], female    DOB: Nov 19, 1965, 57 y.o.   MRN: [id number]  Chief Complaint  Patient presents with   potassium    Patient potassium level is too low     HPI Patient is in today for evaluation of hypokalemia.  She had to postpone shoulder surgery due to hypokalemia.  Potassium was 2.7 initially, and improved only up to 2.9 with Klor-Con 20 mEq twice daily.  Of note, she has now stopped taking chlorthalidone that was prescribed for HTN and leg swelling.  Of note, she has history of chronic hypokalemia and was not able to tolerate Klor-Con in the past, and was given potassium solution for hypokalemia.  She still has insomnia and apneic episodes witnessed by her husband.  She recently had home sleep study, which showed mild OSA.  Considering her symptoms, she would benefit from CPAP treatment.  She is agreeable with the plan.  Past Medical History:  Diagnosis Date   Allergy    Phreesia 09/19/2020   Anxiety    Bradycardia 03/02/2015   Depression    GERD (gastroesophageal reflux disease)    History of hysterectomy    History of oophorectomy    Hx of cholecystectomy    Hypertension    IDA (iron deficiency anemia)    Migraines    Near syncope 03/02/2015   Sciatica neuralgia, right    Stroke (HCC)    "light stroke 08/2015. No deficits   Tobacco abuse 03/03/2015   Tobacco abuse 03/03/2015   Vertigo     Past Surgical History:  Procedure Laterality Date   ABDOMINAL HYSTERECTOMY     BIOPSY  12/04/2016   Procedure: BIOPSY;  Surgeon: West Bali, MD;  Location: [facility];  Service: Endoscopy;;  gastric and duodenal biopsy   BIOPSY  07/30/2017   Procedure: BIOPSY;  Surgeon: West Bali, MD;  Location: [facility];  Service: Endoscopy;;  random colon bx's   CATARACT EXTRACTION W/PHACO Left 05/19/2021   Procedure: CATARACT EXTRACTION PHACO AND INTRAOCULAR LENS PLACEMENT (IOC);  Surgeon: Fabio Pierce, MD;  Location:  [facility];  Service: Ophthalmology;  Laterality: Left;  CDE: 3.35   CATARACT EXTRACTION W/PHACO Right 06/09/2021   Procedure: CATARACT EXTRACTION PHACO AND INTRAOCULAR LENS PLACEMENT (IOC);  Surgeon: Fabio Pierce, MD;  Location: [facility];  Service: Ophthalmology;  Laterality: Right;  CDE: 5.99   CESAREAN SECTION     x2   CESAREAN SECTION N/A    Phreesia 09/19/2020   CHOLECYSTECTOMY     COLONOSCOPY WITH ESOPHAGOGASTRODUODENOSCOPY (EGD)  2007   Dr. Juanda Chance: normal   COLONOSCOPY WITH PROPOFOL N/A 07/30/2017   Dr. Darrick Penna: Redundant colon, external/internal hemorrhoids. Colon otherwise normal.  Random colon biopsies negative.   ESOPHAGOGASTRODUODENOSCOPY (EGD) WITH PROPOFOL N/A 12/04/2016   Dr. Darrick Penna: Gastritis but no H.pylori, small bowel bx negative. gastric polyps benign fundic gland   HEMORRHOID SURGERY      Family History  Problem Relation Age of Onset   ALS Mother    Cardiomyopathy Father    Colon cancer Neg Hx     Social History   Socioeconomic History   Marital status: Married    Spouse name: Not on file   Number of children: Not on file   Years of education: Not on file   Highest education level: Not on file  Occupational History   Not on file  Tobacco Use   Smoking status: Former  Current packs/day: 0.00    Average packs/day: 0.1 packs/day for 20.0 years (2.0 ttl pk-yrs)    Types: Cigarettes    Start date: 02/23/2001    Quit date: 02/23/2021    Years since quitting: 1.8   Smokeless tobacco: Never  Vaping Use   Vaping status: Never Used  Substance and Sexual Activity   Alcohol use: Not Currently    Comment: seldom   Drug use: Yes    Frequency: 2.0 times per week    Types: Marijuana    Comment: occ   Sexual activity: Yes    Birth control/protection: Surgical  Other Topics Concern   Not on file  Social History Narrative   Drinks 5-6 cups of coffee daily.   Social Determinants of Health   Financial Resource Strain: Not on file  Food Insecurity: Not on file   Transportation Needs: Not on file  Physical Activity: Not on file  Stress: Not on file  Social Connections: Not on file  Intimate Partner Violence: Not on file    Outpatient Medications Prior to Visit  Medication Sig Dispense Refill   ADDERALL XR 30 MG 24 hr capsule Take 30 mg by mouth daily.     albuterol (VENTOLIN HFA) 108 (90 Base) MCG/ACT inhaler TAKE 2 PUFFS BY MOUTH EVERY 6 HOURS AS NEEDED FOR WHEEZE OR SHORTNESS OF BREATH 8 g 5   ALPRAZolam (XANAX) 1 MG tablet Take 1 mg by mouth 4 (four) times daily as needed for anxiety.     amLODipine (NORVASC) 10 MG tablet Take 1 tablet (10 mg total) by mouth daily. 90 tablet 1   amphetamine-dextroamphetamine (ADDERALL) 30 MG tablet Take 30 mg by mouth daily.     cetirizine (ZYRTEC) 10 MG tablet TAKE 1 TABLET BY MOUTH EVERY DAY 90 tablet 3   Coenzyme Q10 (CO Q-10 PO) Take 1 capsule by mouth daily.     cyclobenzaprine (FLEXERIL) 10 MG tablet Take 1 tablet (10 mg total) by mouth 2 (two) times daily as needed. 30 tablet 1   estradiol (ESTRACE) 0.5 MG tablet Take 0.5 mg by mouth daily.     Misc. Devices MISC Blood pressure cuff/device - 1. ICD10: I10 1 each 0   pantoprazole (PROTONIX) 40 MG tablet Take 1 tablet (40 mg total) by mouth daily. 90 tablet 3   potassium chloride SA (KLOR-CON M) 20 MEQ tablet Take 1 tablet (20 mEq total) by mouth 2 (two) times daily for 14 days. 28 tablet 0   pravastatin (PRAVACHOL) 10 MG tablet TAKE (1) TABLET BY MOUTH ONCE DAILY. 30 tablet 0   traMADol (ULTRAM) 50 MG tablet Take 1 tablet (50 mg total) by mouth every 12 (twelve) hours as needed. 30 tablet 0   Vitamin D, Ergocalciferol, (DRISDOL) 1.25 MG (50000 UNIT) CAPS capsule TAKE 1 CAPSULE BY MOUTH EVERY 7 DAYS. 12 capsule 1   chlorthalidone (HYGROTON) 25 MG tablet Take 1 tablet (25 mg total) by mouth daily. 90 tablet 1   No facility-administered medications prior to visit.    Allergies  Allergen Reactions   Sulfa Antibiotics Hives and Nausea And Vomiting     Review of Systems  Constitutional:  Negative for chills and fever.  HENT:  Negative for congestion, sinus pressure, sinus pain and sore throat.   Eyes:  Negative for pain and discharge.  Respiratory:  Negative for cough and shortness of breath.   Cardiovascular:  Negative for chest pain and palpitations.  Gastrointestinal:  Negative for abdominal pain, diarrhea, nausea and vomiting.  Endocrine: Negative for polydipsia and polyuria.  Genitourinary:  Negative for dysuria and hematuria.  Musculoskeletal:  Positive for arthralgias and back pain. Negative for neck pain and neck stiffness.  Skin:  Negative for rash.  Neurological:  Negative for dizziness and weakness.  Psychiatric/Behavioral:  Negative for agitation and behavioral problems. The patient is nervous/anxious.        Objective:    Physical Exam Vitals reviewed.  Constitutional:      General: She is not in acute distress.    Appearance: She is obese. She is not diaphoretic.  HENT:     Head: Normocephalic and atraumatic.     Nose: Nose normal.     Mouth/Throat:     Mouth: Mucous membranes are moist.  Eyes:     General: No scleral icterus.    Extraocular Movements: Extraocular movements intact.  Cardiovascular:     Rate and Rhythm: Normal rate and regular rhythm.     Pulses: Normal pulses.     Heart sounds: Normal heart sounds. No murmur heard. Pulmonary:     Breath sounds: Normal breath sounds. No wheezing or rales.  Musculoskeletal:     Cervical back: Neck supple. No tenderness.     Right lower leg: No edema.     Left lower leg: No edema.  Skin:    General: Skin is warm.     Findings: Rash (Lichenied skin around neck area) present.  Neurological:     General: No focal deficit present.     Mental Status: She is alert and oriented to person, place, and time.  Psychiatric:        Mood and Affect: Mood normal.        Behavior: Behavior normal.     BP (!) 142/84 (BP Location: Left Arm)   Pulse (!) 107   Ht  5\' 2"  (1.575 m)   Wt 177 lb 9.6 oz (80.6 kg)   LMP 05/21/1997   SpO2 98%   BMI 32.48 kg/m  Wt Readings from Last 3 Encounters:  01/03/23 177 lb 9.6 oz (80.6 kg)  12/19/22 179 lb 6.4 oz (81.4 kg)  12/03/22 179 lb 6.4 oz (81.4 kg)        Assessment & Plan:   Problem List Items Addressed This Visit       Cardiovascular and Mediastinum   Essential hypertension - Primary (Chronic)    BP Readings from Last 1 Encounters:  01/03/23 (!) 145/87   Uncontrolled with amlodipine 10 mg every day DC chlorthalidone 25 mg QD due to persistent hypokalemia Started spironolactone for HTN, leg swelling and can also help with hypokalemia Counseled for compliance with the medications Advised DASH diet and moderate exercise/walking, at least 150 mins/week      Relevant Medications   spironolactone (ALDACTONE) 25 MG tablet   Other Relevant Orders   Basic Metabolic Panel (BMET)   Magnesium     Respiratory   OSA (obstructive sleep apnea)    Has snoring and chronic fatigue STOP-BANG: 5 At high risk of having OSA  Recently had home sleep study, which showed mild OSA.  Considering her symptoms, she would benefit from CPAP treatment.  She is agreeable with the plan.  CPAP order placed      Relevant Orders   For home use only DME continuous positive airway pressure (CPAP)     Musculoskeletal and Integument   OA (osteoarthritis) of shoulder    Her shoulder arthroplasty was postponed due to hypokalemia -return to work letter provided Followed  by orthopedic surgeon         Other   Leg edema    DC chlorthalidone due to hypokalemia Switched to spironolactone Leg elevation and compression socks as tolerated      Hypokalemia    Due to diuretic use Had switched from Klor-con tablets to potassium chloride solution due to swallowing difficulty, but has been tolerating Klor-Con tablets now Check BMP and Mg Advised to start magnesium 400 mg QD Added spironolactone 25 mg QD for HTN       Relevant Orders   Basic Metabolic Panel (BMET)   Magnesium     Meds ordered this encounter  Medications   spironolactone (ALDACTONE) 25 MG tablet    Sig: Take 1 tablet (25 mg total) by mouth daily.    Dispense:  30 tablet    Refill:  0     Hercules Hasler Concha Se, MD

## 2023-01-03 NOTE — Assessment & Plan Note (Signed)
BP Readings from Last 1 Encounters:  01/03/23 (!) 145/87   Uncontrolled with amlodipine 10 mg every day DC chlorthalidone 25 mg QD due to persistent hypokalemia Started spironolactone for HTN, leg swelling and can also help with hypokalemia Counseled for compliance with the medications Advised DASH diet and moderate exercise/walking, at least 150 mins/week

## 2023-01-03 NOTE — Assessment & Plan Note (Signed)
Due to diuretic use Had switched from Klor-con tablets to potassium chloride solution due to swallowing difficulty, but has been tolerating Klor-Con tablets now Check BMP and Mg Advised to start magnesium 400 mg QD Added spironolactone 25 mg QD for HTN

## 2023-01-04 ENCOUNTER — Ambulatory Visit: Payer: Self-pay | Admitting: Orthopedic Surgery

## 2023-01-04 ENCOUNTER — Inpatient Hospital Stay (HOSPITAL_COMMUNITY)
Admission: RE | Admit: 2023-01-04 | Discharge: 2023-01-04 | Disposition: A | Discharge status: Home / Self Care | Payer: BC Managed Care – PPO | Source: Ambulatory Visit

## 2023-01-04 ENCOUNTER — Other Ambulatory Visit: Payer: Self-pay | Admitting: Internal Medicine

## 2023-01-04 DIAGNOSIS — K219 Gastro-esophageal reflux disease without esophagitis: Secondary | ICD-10-CM

## 2023-01-04 DIAGNOSIS — I10 Essential (primary) hypertension: Secondary | ICD-10-CM

## 2023-01-04 DIAGNOSIS — E782 Mixed hyperlipidemia: Secondary | ICD-10-CM

## 2023-01-04 DIAGNOSIS — Z01818 Encounter for other preprocedural examination: Secondary | ICD-10-CM

## 2023-01-04 NOTE — Pre-Procedure Instructions (Signed)
Called patent for pre-op phone call. Her potassium yesterday was 4.0 so her surgery has been rescheduled to 01/10/2023. Went over 5 bath information again with her as well as her ERAS, NPO status and meds to take am of surgery. She verbalized understanding of all. She needs more CHG and another ERAS. Her husband Minerva Areola will come to main desk to pick this up. I took this to the front desk for him.

## 2023-01-05 LAB — BASIC METABOLIC PANEL
BUN/Creatinine Ratio: 12 (ref 9–23)
BUN: 11 mg/dL (ref 6–24)
CO2: 25 mmol/L (ref 20–29)
Calcium: 9.2 mg/dL (ref 8.7–10.2)
Chloride: 103 mmol/L (ref 96–106)
Creatinine, Ser: 0.91 mg/dL (ref 0.57–1.00)
Glucose: 118 mg/dL — ABNORMAL HIGH (ref 70–99)
Potassium: 4 mmol/L (ref 3.5–5.2)
Sodium: 141 mmol/L (ref 134–144)
eGFR: 74 mL/min/{1.73_m2} (ref >59)

## 2023-01-05 LAB — MAGNESIUM: Magnesium: 1.2 mg/dL — ABNORMAL LOW (ref 1.6–2.3)

## 2023-01-08 ENCOUNTER — Other Ambulatory Visit: Payer: Self-pay | Admitting: Internal Medicine

## 2023-01-09 ENCOUNTER — Other Ambulatory Visit: Payer: Self-pay

## 2023-01-09 ENCOUNTER — Telehealth: Payer: Self-pay | Admitting: Internal Medicine

## 2023-01-09 DIAGNOSIS — G4733 Obstructive sleep apnea (adult) (pediatric): Secondary | ICD-10-CM

## 2023-01-09 NOTE — Telephone Encounter (Addendum)
Pt called in wants a call back in regard to cpap States that she has spoken with [employer] has not received a fax from office.   Needs info re faxed to [employer] and patient would like to know that he confirmation paper was received.  Wants a call back.

## 2023-01-09 NOTE — Telephone Encounter (Signed)
Speaking to patient via mychart

## 2023-01-10 ENCOUNTER — Ambulatory Visit (HOSPITAL_COMMUNITY): Payer: BC Managed Care – PPO | Admitting: Certified Registered Nurse Anesthetist

## 2023-01-10 ENCOUNTER — Encounter (HOSPITAL_COMMUNITY)
Admission: RE | Disposition: A | Discharge status: Home / Self Care | Payer: Self-pay | Source: Home / Self Care | Attending: Orthopedic Surgery

## 2023-01-10 ENCOUNTER — Observation Stay (HOSPITAL_COMMUNITY): Payer: BC Managed Care – PPO

## 2023-01-10 ENCOUNTER — Observation Stay (HOSPITAL_COMMUNITY)
Admission: RE | Admit: 2023-01-10 | Discharge: 2023-01-11 | Disposition: A | Discharge status: Home / Self Care | Payer: BC Managed Care – PPO | Attending: Orthopedic Surgery | Admitting: Orthopedic Surgery

## 2023-01-10 ENCOUNTER — Encounter (HOSPITAL_COMMUNITY): Payer: Self-pay | Admitting: Orthopedic Surgery

## 2023-01-10 ENCOUNTER — Other Ambulatory Visit: Payer: Self-pay

## 2023-01-10 DIAGNOSIS — M19012 Primary osteoarthritis, left shoulder: Principal | ICD-10-CM

## 2023-01-10 DIAGNOSIS — I10 Essential (primary) hypertension: Secondary | ICD-10-CM | POA: Insufficient documentation

## 2023-01-10 DIAGNOSIS — Z8673 Personal history of transient ischemic attack (TIA), and cerebral infarction without residual deficits: Secondary | ICD-10-CM | POA: Insufficient documentation

## 2023-01-10 DIAGNOSIS — Z96612 Presence of left artificial shoulder joint: Secondary | ICD-10-CM | POA: Diagnosis not present

## 2023-01-10 DIAGNOSIS — G4730 Sleep apnea, unspecified: Secondary | ICD-10-CM | POA: Diagnosis not present

## 2023-01-10 HISTORY — PX: TOTAL SHOULDER ARTHROPLASTY: SHX126

## 2023-01-10 LAB — HEMOGLOBIN AND HEMATOCRIT, BLOOD
HCT: 35.2 % — ABNORMAL LOW (ref 36.0–46.0)
Hemoglobin: 11 g/dL — ABNORMAL LOW (ref 12.0–15.0)

## 2023-01-10 SURGERY — ARTHROPLASTY, SHOULDER, TOTAL
Anesthesia: General | Site: Shoulder | Laterality: Left

## 2023-01-10 MED ORDER — STERILE WATER FOR IRRIGATION IR SOLN
Status: DC | PRN
  Administered 2023-01-10: 1000 mL

## 2023-01-10 MED ORDER — ONDANSETRON HCL 4 MG/2ML IJ SOLN
INTRAMUSCULAR | Status: AC
  Filled 2023-01-10: qty 2

## 2023-01-10 MED ORDER — KETAMINE HCL 10 MG/ML IJ SOLN
INTRAMUSCULAR | Status: DC | PRN
  Administered 2023-01-10: 50 mg via INTRAVENOUS

## 2023-01-10 MED ORDER — DEXAMETHASONE SODIUM PHOSPHATE 10 MG/ML IJ SOLN
INTRAMUSCULAR | Status: DC | PRN
  Administered 2023-01-10: 10 mg via INTRAVENOUS

## 2023-01-10 MED ORDER — VANCOMYCIN HCL 1000 MG IV SOLR
INTRAVENOUS | Status: DC | PRN
  Administered 2023-01-10: 1000 mg

## 2023-01-10 MED ORDER — FENTANYL CITRATE (PF) 250 MCG/5ML IJ SOLN
INTRAMUSCULAR | Status: DC | PRN
  Administered 2023-01-10: 75 ug via INTRAVENOUS
  Administered 2023-01-10: 100 ug via INTRAVENOUS
  Administered 2023-01-10: 25 ug via INTRAVENOUS

## 2023-01-10 MED ORDER — ORAL CARE MOUTH RINSE: 15.0000 mL | Freq: Once | OROMUCOSAL | Status: DC

## 2023-01-10 MED ORDER — FENTANYL CITRATE PF 50 MCG/ML IJ SOSY: 25.0000 ug | PREFILLED_SYRINGE | INTRAMUSCULAR | Status: DC | PRN

## 2023-01-10 MED ORDER — OXYCODONE HCL 5 MG PO TABS
5.0000 mg | ORAL_TABLET | ORAL | Status: DC | PRN
  Administered 2023-01-10: 5 mg via ORAL
  Filled 2023-01-10: qty 1

## 2023-01-10 MED ORDER — VANCOMYCIN HCL 1000 MG IV SOLR
INTRAVENOUS | Status: AC
  Filled 2023-01-10: qty 20

## 2023-01-10 MED ORDER — PHENYLEPHRINE HCL-NACL 20-0.9 MG/250ML-% IV SOLN
INTRAVENOUS | Status: DC | PRN
  Administered 2023-01-10: 50 ug/min via INTRAVENOUS

## 2023-01-10 MED ORDER — LACTATED RINGERS IV SOLN: INTRAVENOUS | Status: DC

## 2023-01-10 MED ORDER — GLYCOPYRROLATE PF 0.2 MG/ML IJ SOSY
PREFILLED_SYRINGE | INTRAMUSCULAR | Status: AC
  Filled 2023-01-10: qty 1

## 2023-01-10 MED ORDER — DEXAMETHASONE SODIUM PHOSPHATE 10 MG/ML IJ SOLN
INTRAMUSCULAR | Status: AC
  Filled 2023-01-10: qty 1

## 2023-01-10 MED ORDER — CHLORHEXIDINE GLUCONATE 0.12 % MT SOLN: 15.0000 mL | Freq: Once | OROMUCOSAL | Status: DC

## 2023-01-10 MED ORDER — MIDAZOLAM HCL 2 MG/2ML IJ SOLN
INTRAMUSCULAR | Status: AC
  Administered 2023-01-10: 2 mg
  Filled 2023-01-10: qty 2

## 2023-01-10 MED ORDER — MORPHINE SULFATE (PF) 2 MG/ML IV SOLN
0.5000 mg | INTRAVENOUS | Status: DC | PRN
  Administered 2023-01-10 – 2023-01-11 (×5): 1 mg via INTRAVENOUS
  Filled 2023-01-10 (×5): qty 1

## 2023-01-10 MED ORDER — PROPOFOL 10 MG/ML IV BOLUS
INTRAVENOUS | Status: AC
  Filled 2023-01-10: qty 20

## 2023-01-10 MED ORDER — ROCURONIUM BROMIDE 10 MG/ML (PF) SYRINGE
PREFILLED_SYRINGE | INTRAVENOUS | Status: AC
  Filled 2023-01-10: qty 10

## 2023-01-10 MED ORDER — ONDANSETRON HCL 4 MG/2ML IJ SOLN
INTRAMUSCULAR | Status: DC | PRN
Start: 2023-01-10 — End: 2023-01-10
  Administered 2023-01-10: 4 mg via INTRAVENOUS

## 2023-01-10 MED ORDER — CEFAZOLIN SODIUM-DEXTROSE 2-4 GM/100ML-% IV SOLN
INTRAVENOUS | Status: AC
  Filled 2023-01-10: qty 100

## 2023-01-10 MED ORDER — OXYCODONE HCL 5 MG PO TABS
10.0000 mg | ORAL_TABLET | ORAL | Status: DC | PRN
  Administered 2023-01-11: 10 mg via ORAL
  Filled 2023-01-10: qty 2

## 2023-01-10 MED ORDER — ONDANSETRON HCL 4 MG/2ML IJ SOLN: 4.0000 mg | Freq: Four times a day (QID) | INTRAMUSCULAR | Status: DC | PRN

## 2023-01-10 MED ORDER — PHENYLEPHRINE HCL (PRESSORS) 10 MG/ML IV SOLN
INTRAVENOUS | Status: DC | PRN
  Administered 2023-01-10: 160 ug via INTRAVENOUS

## 2023-01-10 MED ORDER — CEFAZOLIN SODIUM-DEXTROSE 2-4 GM/100ML-% IV SOLN
2.0000 g | Freq: Three times a day (TID) | INTRAVENOUS | Status: AC
  Administered 2023-01-10 (×2): 2 g via INTRAVENOUS
  Filled 2023-01-10 (×3): qty 100

## 2023-01-10 MED ORDER — KETAMINE HCL 50 MG/5ML IJ SOSY
PREFILLED_SYRINGE | INTRAMUSCULAR | Status: AC
  Filled 2023-01-10: qty 5

## 2023-01-10 MED ORDER — BUPIVACAINE-EPINEPHRINE (PF) 0.5% -1:200000 IJ SOLN
INTRAMUSCULAR | Status: AC
  Filled 2023-01-10: qty 30

## 2023-01-10 MED ORDER — ONDANSETRON HCL 4 MG/2ML IJ SOLN: 4.0000 mg | Freq: Once | INTRAMUSCULAR | Status: DC | PRN

## 2023-01-10 MED ORDER — ONDANSETRON HCL 4 MG PO TABS: 4.0000 mg | ORAL_TABLET | Freq: Four times a day (QID) | ORAL | Status: DC | PRN

## 2023-01-10 MED ORDER — CEFAZOLIN SODIUM-DEXTROSE 2-4 GM/100ML-% IV SOLN
2.0000 g | INTRAVENOUS | Status: AC
  Administered 2023-01-10: 2 g via INTRAVENOUS

## 2023-01-10 MED ORDER — DIPHENHYDRAMINE HCL 12.5 MG/5ML PO ELIX: 12.5000 mg | ORAL_SOLUTION | ORAL | Status: DC | PRN

## 2023-01-10 MED ORDER — FENTANYL CITRATE (PF) 100 MCG/2ML IJ SOLN
INTRAMUSCULAR | Status: AC
  Filled 2023-01-10: qty 2

## 2023-01-10 MED ORDER — ACETAMINOPHEN 500 MG PO TABS
500.0000 mg | ORAL_TABLET | Freq: Three times a day (TID) | ORAL | Status: DC
  Administered 2023-01-10 – 2023-01-11 (×3): 500 mg via ORAL
  Filled 2023-01-10 (×3): qty 1

## 2023-01-10 MED ORDER — SUGAMMADEX SODIUM 200 MG/2ML IV SOLN
INTRAVENOUS | Status: DC | PRN
  Administered 2023-01-10: 200 mg via INTRAVENOUS

## 2023-01-10 MED ORDER — ROCURONIUM BROMIDE 10 MG/ML (PF) SYRINGE
PREFILLED_SYRINGE | INTRAVENOUS | Status: DC | PRN
  Administered 2023-01-10: 10 mg via INTRAVENOUS
  Administered 2023-01-10: 50 mg via INTRAVENOUS
  Administered 2023-01-10: 10 mg via INTRAVENOUS

## 2023-01-10 MED ORDER — TRANEXAMIC ACID-NACL 1000-0.7 MG/100ML-% IV SOLN
INTRAVENOUS | Status: AC
  Filled 2023-01-10: qty 100

## 2023-01-10 MED ORDER — TRANEXAMIC ACID-NACL 1000-0.7 MG/100ML-% IV SOLN
1000.0000 mg | INTRAVENOUS | Status: AC
  Administered 2023-01-10: 1000 mg via INTRAVENOUS

## 2023-01-10 MED ORDER — PROPOFOL 10 MG/ML IV BOLUS
INTRAVENOUS | Status: DC | PRN
Start: 2023-01-10 — End: 2023-01-10
  Administered 2023-01-10: 150 mg via INTRAVENOUS

## 2023-01-10 MED ORDER — SODIUM CHLORIDE 0.9 % IR SOLN
Status: DC | PRN
  Administered 2023-01-10: 3000 mL
  Administered 2023-01-10: 1000 mL

## 2023-01-10 MED ORDER — CEFAZOLIN SODIUM-DEXTROSE 2-4 GM/100ML-% IV SOLN
INTRAVENOUS | Status: AC
  Administered 2023-01-11: 2 g via INTRAVENOUS
  Filled 2023-01-10: qty 100

## 2023-01-10 MED ORDER — OXYCODONE HCL 5 MG PO TABS: 5.0000 mg | ORAL_TABLET | Freq: Once | ORAL | Status: DC | PRN

## 2023-01-10 MED ORDER — OXYCODONE HCL 5 MG/5ML PO SOLN: 5.0000 mg | Freq: Once | ORAL | Status: DC | PRN

## 2023-01-10 SURGICAL SUPPLY — 75 items
ANCH SUT 2 SWLK 19.1 CLS EYLT (Anchor) ×1 IMPLANT
ANCHOR SWIVELOCK BIO 4.75X19.1 (Anchor) IMPLANT
APL PRP STRL LF DISP 70% ISPRP (MISCELLANEOUS) ×2
APL PRP STRL LF ISPRP CHG 10.5 (MISCELLANEOUS) ×1
APPLICATOR CHLORAPREP 10.5 ORG (MISCELLANEOUS) IMPLANT
BLADE SAW SGTL 83.5X18.5 (BLADE) ×1 IMPLANT
BNDG GAUZE ELAST 4 BULKY (GAUZE/BANDAGES/DRESSINGS) ×2 IMPLANT
BODY TRUNION ECLIPSE 39 SL (Shoulder) IMPLANT
BOWL SMART MIX CTS (DISPOSABLE) IMPLANT
CEMENT HV SMART SET (Cement) ×2 IMPLANT
CHLORAPREP W/TINT 26 (MISCELLANEOUS) ×2 IMPLANT
CLOTH BEACON ORANGE TIMEOUT ST (SAFETY) ×1 IMPLANT
COOLER ICEMAN CLASSIC (MISCELLANEOUS) ×1 IMPLANT
COVER LIGHT HANDLE STERIS (MISCELLANEOUS) ×2 IMPLANT
DRAPE INCISE IOBAN 44X35 STRL (DRAPES) ×1 IMPLANT
DRAPE SHOULDER BEACH CHAIR (DRAPES) ×1 IMPLANT
DRAPE U-SHAPE 47X51 STRL (DRAPES) ×1 IMPLANT
DRSG AQUACEL AG ADV 3.5X10 (GAUZE/BANDAGES/DRESSINGS) IMPLANT
ELECT REM PT RETURN 9FT ADLT (ELECTROSURGICAL) ×1
ELECTRODE REM PT RTRN 9FT ADLT (ELECTROSURGICAL) ×1 IMPLANT
GLENOID WITH CLEAT SM (Miscellaneous) IMPLANT
GLOVE BIO SURGEON STRL SZ7 (GLOVE) IMPLANT
GLOVE BIO SURGEON STRL SZ8 (GLOVE) ×3 IMPLANT
GLOVE BIOGEL PI IND STRL 7.0 (GLOVE) ×2 IMPLANT
GLOVE BIOGEL PI IND STRL 8 (GLOVE) ×1 IMPLANT
GOWN STRL REUS W/ TWL XL LVL3 (GOWN DISPOSABLE) ×1 IMPLANT
GOWN STRL REUS W/TWL LRG LVL3 (GOWN DISPOSABLE) ×2 IMPLANT
GOWN STRL REUS W/TWL XL LVL3 (GOWN DISPOSABLE) ×1
HANDPIECE INTERPULSE COAX TIP (DISPOSABLE) ×1
HEAD HUMERAL ECLIPSE 39/18 (Shoulder) IMPLANT
HOOD W/PEELAWAY (MISCELLANEOUS) ×3 IMPLANT
IMPL ECLIPSE SPEEDCAP (Shoulder) IMPLANT
IMPLANT ECLIPSE SPEEDCAP (Shoulder) ×1 IMPLANT
IV NS IRRIG 3000ML ARTHROMATIC (IV SOLUTION) ×1 IMPLANT
KIT BLADEGUARD II DBL (SET/KITS/TRAYS/PACK) ×1 IMPLANT
KIT POSITION SHOULDER SCHLEI (MISCELLANEOUS) ×1 IMPLANT
KIT ROOT REPAIR MEINISCAL PEEK (Anchor) IMPLANT
KIT SET UNIVERSAL (KITS) IMPLANT
KIT TURNOVER KIT A (KITS) ×1 IMPLANT
MANIFOLD NEPTUNE II (INSTRUMENTS) ×1 IMPLANT
MARKER SKIN DUAL TIP RULER LAB (MISCELLANEOUS) ×1 IMPLANT
MEINISCAL ROOT REPAIR KIT PEEK (Anchor) ×2 IMPLANT
NDL HYPO 21X1.5 SAFETY (NEEDLE) IMPLANT
NDL MA TROC 1/2 (NEEDLE) ×1 IMPLANT
NEEDLE HYPO 21X1.5 SAFETY (NEEDLE) ×1
NEEDLE MA TROC 1/2 (NEEDLE) ×1
NS IRRIG 1000ML POUR BTL (IV SOLUTION) ×1 IMPLANT
PACK BASIC III (CUSTOM PROCEDURE TRAY) ×1
PACK SRG BSC III STRL LF ECLPS (CUSTOM PROCEDURE TRAY) ×1 IMPLANT
PACK TOTAL JOINT (CUSTOM PROCEDURE TRAY) ×1 IMPLANT
PAD ABD 5X9 TENDERSORB (GAUZE/BANDAGES/DRESSINGS) ×4 IMPLANT
PAD ARMBOARD 7.5X6 YLW CONV (MISCELLANEOUS) ×1 IMPLANT
PAD COLD SHLDR WRAP-ON (PAD) ×1 IMPLANT
PIN NITINOL TARGETER 2.8 (PIN) IMPLANT
PRESSURIZER FEMORAL UNIV (MISCELLANEOUS) ×1 IMPLANT
SCREW MED ECLIPSE 35 (Screw) IMPLANT
SET BASIN LINEN APH (SET/KITS/TRAYS/PACK) ×1 IMPLANT
SET HNDPC FAN SPRY TIP SCT (DISPOSABLE) ×1 IMPLANT
SIZER ECLIPSE CAGE SCREW (ORTHOPEDIC DISPOSABLE SUPPLIES) IMPLANT
SLING ULTRA III MED (ORTHOPEDIC SUPPLIES) IMPLANT
STRIP CLOSURE SKIN 1/2X4 (GAUZE/BANDAGES/DRESSINGS) ×2 IMPLANT
SUT MNCRL AB 4-0 PS2 18 (SUTURE) ×1 IMPLANT
SUT MON AB 2-0 CT1 36 (SUTURE) ×1 IMPLANT
SUT VIC AB 0 CT1 27 (SUTURE) ×1
SUT VIC AB 0 CT1 27XBRD ANTBC (SUTURE) ×1 IMPLANT
SUTURE TAPE 1.3 40 TPR END (SUTURE) ×3 IMPLANT
SUTURETAPE 1.3 40 TPR END (SUTURE) ×2
SUTURETAPE 1.3 40 W/NDL BLK/WH (SUTURE) ×3 IMPLANT
SYR 30ML LL (SYRINGE) IMPLANT
SYR BULB IRRIG 60ML STRL (SYRINGE) ×2 IMPLANT
SYR TOOMEY IRRIG 70ML (MISCELLANEOUS) ×1
SYRINGE TOOMEY IRRIG 70ML (MISCELLANEOUS) IMPLANT
TOWEL OR 17X26 4PK STRL BLUE (TOWEL DISPOSABLE) ×1 IMPLANT
WATER STERILE IRR 1000ML POUR (IV SOLUTION) ×2 IMPLANT
YANKAUER SUCT 12FT TUBE ARGYLE (SUCTIONS) ×1 IMPLANT

## 2023-01-10 NOTE — Anesthesia Procedure Notes (Signed)
Procedure Name: Intubation Date/Time: 01/10/2023 7:41 AM  Performed by: Jeanette Caprice, CRNAPre-anesthesia Checklist: Patient identified, Emergency Drugs available, Suction available and Patient being monitored Patient Re-evaluated:Patient Re-evaluated prior to induction Oxygen Delivery Method: Circle system utilized Preoxygenation: Pre-oxygenation with 100% oxygen Induction Type: IV induction Laryngoscope Size: Mac and 3 Grade View: Grade I Tube type: Oral Tube size: 7.0 mm Number of attempts: 1 Airway Equipment and Method: Stylet and Oral airway Placement Confirmation: ETT inserted through vocal cords under direct vision, positive ETCO2 and breath sounds checked- equal and bilateral Secured at: 21 cm Tube secured with: Tape Dental Injury: Teeth and Oropharynx as per pre-operative assessment

## 2023-01-10 NOTE — Progress Notes (Signed)
Hgb 11.0/Hct 35.2 results called to Dr Dallas Schimke. No new orders given.

## 2023-01-10 NOTE — Op Note (Signed)
Orthopaedic Surgery Operative Note (CSN: [id number])  [name] [name]  May 03, 1966 Date of Surgery: 01/10/2023   Diagnoses:  Left glenohumeral arthritis  Procedure: Anatomic left total shoulder arthroplasty   Operative Finding Successful completion of the planned procedure.  Stemless humeral implant.  Cemented all poly glenoid.  Excellent repair of the subscapularis with closure of the interval.   Post-Op Diagnosis: Same Surgeons:Primary: Oliver Barre, MD Assistants: Cecile Sheerer location: [facility] ROOM 4 Anesthesia: General with interscalene block Antibiotics: Ancef 2 g with local vancomycin powder 1 g at the surgical site Tourniquet time: N/A Estimated Blood Loss: 350 cc Complications: None Specimens: None  Implants: Implant Name Type Inv. Item Serial No. Manufacturer Lot No. LRB No. Used Action  CEMENT HV SMART SET - [id number] Cement CEMENT HV SMART SET  [employer] [id number] Left 1 Implanted  GLENOID WITH CLEAT SM - [id number] Miscellaneous GLENOID WITH CLEAT SM  [employer] [id number] Left 1 Implanted  BODY TRUNION ECLIPSE 39 SL - [id number] Shoulder BODY TRUNION ECLIPSE 39 SL  [employer] [id number] Left 1 Implanted  SCREW MED ECLIPSE 35 - [id number] Screw SCREW MED ECLIPSE 35  [employer] [id number] Left 1 Implanted  HEAD HUMERAL ECLIPSE 39/18 - [id number] Shoulder HEAD HUMERAL ECLIPSE 39/18  [employer] [id number] Left 1 Implanted  IMPLANT ECLIPSE SPEEDCAP - [id number] Shoulder IMPLANT ECLIPSE SPEEDCAP  [employer] [id number] Left 1 Implanted  ANCH SUT 2 SWLK 19.1 CLS EYLT - [id number] Anchor ANCH SUT 2 SWLK 19.1 CLS EYLT  [employer] [id number] Left 1 Implanted  MEINISCAL ROOT REPAIR KIT PEEK - [id number] Anchor MEINISCAL ROOT REPAIR KIT PEEK  [employer] [id number] Left 1 Implanted    Indications for Surgery:   [name] [name] is a 57 y.o. female with progressively worsening Left shoulder pain and imaging demonstrating severe glenohumeral arthritis.  Attempts to improve the pain with  medications, exercises, activity modifications and shoulder injections have not provided sufficient relief.  Patient is now complaining of worsening function and debilitating pain.  As a result, I have recommended shoulder replacement in order to restore form and function.  Benefits and risks of operative and nonoperative management were discussed prior to surgery with the patient and informed consent form was completed.  Specific risks including infection, need for additional surgery, instability, dislocation, bleeding, damage to surrounding structures, persistent pain and more severe complications associated with anesthesia.  All questions were answered.    Procedure:   The patient was identified properly. Informed consent was obtained and the surgical site was marked. The patient was taken to the OR where general anesthesia was induced.  The patient was positioned in beach chair position with a pneumatic arm holder.  The left shoulder was prepped and draped in the usual sterile fashion.  Timeout was performed before the beginning of the case.  Patient received the above stated antibiotics and 1 g of TXA prior to making incision.  A standard deltopectoral approach was performed with a #10 blade. We dissected down to the subcutaneous tissues and the cephalic vein was taken laterally with the deltoid. The clavipectoral fascia was incised in line with the incision. Deep retractors were placed. The long of the biceps tendon was identified and there was significant tenosynovitis present.  Tenodesis was performed to the pectoralis tendon with #2 Fiberwire. The remaining biceps was followed up into the rotator interval where it was released. The subscapularis was taken down tendon peel off the footprint. The underlying capsule was elevated off of the humeral neck  and the osteophytes inferiorly. #2 Fiberwire sutures are passed through the tendon for subscap manipulation. We continued releasing the capsule directly  off of the osteophytes inferiorly all the way around the corner. This allowed [place] to dislocate the humeral head. The humeral head had evidence of severe osteoarthritic wear with full-thickness cartilage loss and exposed subchondral bone.  I  The rotator cuff was carefully examined and noted to be intact without sign of wear.  The decision was confirmed that an anatomic total shoulder was indicated for this patient.  Where  There were osteophytes along the inferior humeral neck. The osteophytes were removed with an osteotome and a rongeur and the anatomic neck was well visualized.   We next made our humeral osteotomy with an oscillating saw along the anatomic neck. The head fragment was passed off the back table and measured approximately 39 mm in diameter.   Bone quality was reasonable and we decided that it was appropriate to use stemless implants.  The humeral cut and head size was evaluated.  We placed a guide for the coring reamer.  The corner reamer was then used.  A cut protector was placed.  The humerus was reduced.  We placed a retractor on the posterior aspect of the glenoid to gain exposure to the joint.  The subscapularis was again identified and immediately we took care to palpate the axillary nerve anteriorly and verify its position with gentle palpation as well as the tug test.  We then released the SGHL with bovie cautery prior to placing a curved mayo at the junction of the anterior glenoid well above the axillary nerve and bluntly dissecting the subscapularis from the capsule.  We then carefully protected the axillary nerve as we gently released the inferior capsule to fully mobilize the subscapularis.  An anterior Derra retractor was then placed as well as a small Hohmann retractor superiorly.    The glenoid was inspected and had evidence of severe osteoarthritic wear with full-thickness cartilage loss and exposed subchondral bone. The remaining labrum was removed circumferentially taking  great care not to disrupt the posterior capsule.  Based on our preoperative templating, the guide was placed in the appropriate position. The center hole was drilled through the guide.  The glenoid was reamed concentrically over the guide pin. Next the center hole was enlarged and the drill guide for the peripheral pegs was placed. The vault was intact. The peripheral pegs were drilled in standard fashion. A trial glenoid was placed and was stable. We removed the trial components.   The glenoid bone was prepared with pulsatile lavage and a sponge to dry the bone prior to cement application. Cement was mixed on the back table the peripheral peg holes were cemented. The glenoid was impacted securely.   We turned attention back to the humeral side. The cut protector was removed.  The trunnion was secured to the cut surface of the humeral head.  We then drilled for a cage screw, which we determined to be medium.  Disc cage screw was then inserted, maintaining compression on the existing trunnion.  We then trialed with multiple size head options and selected a 39/18 which re-created the patient's anatomy. The offset was dialed in to match the normal anatomy. The shoulder was trialed.  There was good ROM in all planes and the shoulder was stable with approximately 50% posterior spring back and no inferior translation.  The real humeral implants were opened on the back table and assembled.  The trial was  removed. Next we impacted the appropriately sized humeral head on to the tray.  The joint was reduced and thoroughly irrigated with pulsatile lavage. We placed multiple fibertak anchors around the implant, spanning the footprint of the subscapularis.  The sutures were passed through the subscapularis tendon and then secured within the biciptal groove with swivel lock anchors to complete a double row repair.  Next the rotator interval was closed with #2 fiberwire suture. Hemostasis was obtained. The deltopectoral  interval was reapproximated with #1 vicryl. The subcutaneous tissues were closed with 2-0 Vicryl and the skin was closed with a running monocryl. The incisions were cleaned and dried and an Aquacel dressing was placed. The drapes taken down. The arm was placed into sling with abduction pillow and a polar care machine.   Patient was awakened, extubated, and transferred to the recovery room in stable condition. There were no intraoperative complications. The sponge, needle, and attention counts were correct at the end of the case.     Post-operative plan:  The patient will be admitted for over night observation To remain in the sling at all times, NWB PT to begin in the next 7-10 days.   DVT prophylaxis Aspirin 81 mg twice daily for 6 weeks, or the patient is fully ambulatory Pain control with PRN pain medication preferring oral medicines.   Follow up plan will be scheduled in approximately 10-14 days for incision check and XR

## 2023-01-10 NOTE — Progress Notes (Signed)
PT Cancellation Note  Patient Details Name: [name] [name] MRN: [id number] DOB: 16-Aug-1965   Cancelled Treatment:    Reason Eval/Treat Not Completed: Other (comment)  Attempted to see pt. Pt is deeply asleep at this time. Virgina Organ, PT CLT [phone]  01/10/2023, 2:39 PM

## 2023-01-10 NOTE — Anesthesia Preprocedure Evaluation (Signed)
Anesthesia Evaluation  Patient identified by MRN, date of birth, ID band Patient awake    Reviewed: Allergy & Precautions, H&P , NPO status , Patient's Chart, lab work & pertinent test results, reviewed documented beta blocker date and time   Airway Mallampati: II  TM Distance: >3 FB Neck ROM: full    Dental no notable dental hx.    Pulmonary neg pulmonary ROS, sleep apnea , former smoker   Pulmonary exam normal breath sounds clear to auscultation       Cardiovascular Exercise Tolerance: Good hypertension, negative cardio ROS  Rhythm:regular Rate:Normal     Neuro/Psych  Headaches PSYCHIATRIC DISORDERS Anxiety Depression     Neuromuscular disease CVA negative neurological ROS  negative psych ROS   GI/Hepatic negative GI ROS, Neg liver ROS,GERD  ,,  Endo/Other  negative endocrine ROS    Renal/GU negative Renal ROS  negative genitourinary   Musculoskeletal   Abdominal   Peds  Hematology negative hematology ROS (+) Blood dyscrasia, anemia   Anesthesia Other Findings   Reproductive/Obstetrics negative OB ROS                             Anesthesia Physical Anesthesia Plan  ASA: 3  Anesthesia Plan: General and General ETT   Post-op Pain Management: Regional block*   Induction:   PONV Risk Score and Plan: Ondansetron  Airway Management Planned:   Additional Equipment:   Intra-op Plan:   Post-operative Plan:   Informed Consent: I have reviewed the patients History and Physical, chart, labs and discussed the procedure including the risks, benefits and alternatives for the proposed anesthesia with the patient or authorized representative who has indicated his/her understanding and acceptance.     Dental Advisory Given  Plan Discussed with: CRNA  Anesthesia Plan Comments:        Anesthesia Quick Evaluation

## 2023-01-10 NOTE — H&P (Signed)
Below is the most recent clinic note for [name] [name]; any pertinent information regarding their recent medical history will be updated on the day of surgery.    Orthopaedic Clinic Return  Assessment: [name] [name] is a 57 y.o. female with the following: Left glenohumeral joint arthritis   Plan: [name] [name] has advanced arthritis in the left glenohumeral joint.  We obtained an MRI, which confirms this diagnosis.  In addition, the rotator cuff tendons are intact.  She does have irritation of the supraspinatus and infraspinatus, but again there are no injuries to these tendons.  As a result, we can proceed with an anatomic total shoulder arthroplasty.  The surgery was discussed in detail.  All questions have been answered.  I will now obtain a CT scan of the left shoulder for preoperative templating.  This will allow [place] to have a surgical plan, which is personalized to the patient's anatomy.  We will confirm medical clearance with her primary care physician.  Risks and benefits of the surgery, including, but not limited to infection, bleeding, persistent pain, need for further surgery, non-union damage to surrounding structures, stiffness and more severe complications associated with anesthesia were discussed with the patient.  The patient has elected to proceed.  Surgery will be scheduled once we have completed the CT scan.   Follow-up: No follow-ups on file.   Subjective:  No chief complaint on file.   History of Present Illness: [name] [name] is a 57 y.o. female who returns to clinic for evaluation of her left shoulder pain.  She has known left shoulder glenohumeral joint arthritis.  She has worked with physical therapy.  She has had multiple injections in the left shoulder.  Radiographs, and now an MRI confirmed severe left shoulder glenohumeral joint arthritis.  She is here to discuss the findings of her MRI.  She continues to have pain.  Difficulty with overhead motion.  Pain  keeps her up at night.  She is ready to consider surgery.  Review of Systems: No fevers or chills Occasional numbness or tingling No chest pain No shortness of breath No bowel or bladder dysfunction No GI distress No headaches   Objective: LMP 05/21/1997   Physical Exam:  Alert and oriented.  No acute distress.  Left shoulder without deformity.  No swelling.  No bruising.  Active motion limited forward flexion abduction limited to shoulder height.  Passive forward flexion to 110 degrees.  Abduction to 90 degrees.  External rotation is 45 degrees.  Positive Jobe's.  4+/5 strength in the supraspinatus and infraspinatus.  Negative belly press.   IMAGING: I personally ordered and reviewed the following images:  Left shoulder MRI  IMPRESSION: 1. Moderate tendinosis of the supraspinatus tendon. 2. Severe tendinosis of the infraspinatus tendon. 3. Severe osteoarthritis of the left glenohumeral joint.  Oliver Barre, MD 01/10/2023 7:08 AM

## 2023-01-10 NOTE — Interval H&P Note (Signed)
History and Physical Interval Note:  01/10/2023 7:09 AM  [name] [name]  has presented today for surgery, with the diagnosis of Left glenohumeral arthritis.  The various methods of treatment have been discussed with the patient and family. After consideration of risks, benefits and other options for treatment, the patient has consented to  Procedure(s): TOTAL SHOULDER ARTHROPLASTY (Left) as a surgical intervention.  The patient's history has been reviewed, patient examined, no change in status, stable for surgery.  I have reviewed the patient's chart and labs.  Questions were answered to the patient's satisfaction.    Plan for left anatomic shoulder replacement, possible reverse shoulder replacement.  Previous surgery canceled due to hypokalemia.  Potassium has been correct and remains within normal limits.  [name] [name] is ready to proceed with surgery.      Oliver Barre

## 2023-01-10 NOTE — Transfer of Care (Signed)
Immediate Anesthesia Transfer of Care Note  Patient: [name] [name]  Procedure(s) Performed: TOTAL SHOULDER ARTHROPLASTY (Left: Shoulder)  Patient Location: PACU  Anesthesia Type:GA combined with regional for post-op pain  Level of Consciousness: sedated  Airway & Oxygen Therapy: Patient Spontanous Breathing and Patient connected to face mask oxygen  Post-op Assessment: Report given to RN  Post vital signs: Reviewed and stable  Last Vitals:  Vitals Value Taken Time  BP 96/51 01/10/23 1126  Temp 97.5   Pulse 67 01/10/23 1129  Resp 21 01/10/23 1129  SpO2 94 % 01/10/23 1129  Vitals shown include unfiled device data.  Last Pain:  Vitals:   01/10/23 0703  PainSc: 0-No pain         Complications: No notable events documented.

## 2023-01-11 DIAGNOSIS — I10 Essential (primary) hypertension: Secondary | ICD-10-CM | POA: Diagnosis not present

## 2023-01-11 DIAGNOSIS — M19012 Primary osteoarthritis, left shoulder: Secondary | ICD-10-CM | POA: Diagnosis not present

## 2023-01-11 DIAGNOSIS — Z8673 Personal history of transient ischemic attack (TIA), and cerebral infarction without residual deficits: Secondary | ICD-10-CM | POA: Diagnosis not present

## 2023-01-11 MED ORDER — MELOXICAM 15 MG PO TABS: 15.0000 mg | ORAL_TABLET | Freq: Every day | ORAL | 0 refills | Status: AC

## 2023-01-11 MED ORDER — OXYCODONE HCL 5 MG PO TABS
5.0000 mg | ORAL_TABLET | ORAL | 0 refills | Status: AC | PRN
Start: 2023-01-11 — End: 2023-01-18

## 2023-01-11 MED ORDER — ASPIRIN 81 MG PO TBEC: 81.0000 mg | DELAYED_RELEASE_TABLET | Freq: Two times a day (BID) | ORAL | 0 refills | Status: AC

## 2023-01-11 MED ORDER — ACETAMINOPHEN 500 MG PO TABS: 1000.0000 mg | ORAL_TABLET | Freq: Three times a day (TID) | ORAL | 0 refills | Status: AC

## 2023-01-11 MED ORDER — ONDANSETRON HCL 4 MG PO TABS: 4.0000 mg | ORAL_TABLET | Freq: Three times a day (TID) | ORAL | 0 refills | Status: AC | PRN

## 2023-01-11 NOTE — Evaluation (Signed)
Physical Therapy Evaluation Patient Details Name: [name] [name] MRN: [id number] DOB: [birth year] Today's Date: 01/11/2023  History of Present Illness  57 y.o. F admitted on 01/10/23 due to advanced arthritis in the L glenohumeral joint. She is now s/p anatomic total shoulder arthroplasty. PMH significant for HTN, OSA, OA, hypokalemia.   Clinical Impression  Patient wanted review of sling positioning/alignment which was positioned well. Patient able to complete all mobility without assist but did require assist to don robe due to inability to use LUE s/p L TSA. Patient ambulates in room/hall without assist or loss of balance. Patient returned to room at end of session. Patient does not require additional PT services at this time. Patient discharged to care of nursing for ambulation daily as tolerated for length of stay.         If plan is discharge home, recommend the following: A little help with bathing/dressing/bathroom   Can travel by private vehicle        Equipment Recommendations None recommended by PT  Recommendations for Other Services       Functional Status Assessment Patient has had a recent decline in their functional status and demonstrates the ability to make significant improvements in function in a reasonable and predictable amount of time.     Precautions / Restrictions Precautions Precautions: Shoulder Type of Shoulder Precautions: Arthoplasty Shoulder Interventions: Shoulder sling/immobilizer;Shoulder abduction pillow;At all times;Off for dressing/bathing/exercises Precaution Booklet Issued: No Precaution Comments: NWB, Remain in immobilizer at all times Required Braces or Orthoses: Sling Restrictions Weight Bearing Restrictions: Yes LUE Weight Bearing: Non weight bearing      Mobility  Bed Mobility Overal bed mobility: Independent                  Transfers Overall transfer level: Independent Equipment used: None                     Ambulation/Gait Ambulation/Gait assistance: Independent Gait Distance (Feet): 100 Feet Assistive device: None Gait Pattern/deviations: Step-through pattern, WFL(Within Functional Limits)          Stairs            Wheelchair Mobility     Tilt Bed    Modified Rankin (Stroke Patients Only)       Balance Overall balance assessment: No apparent balance deficits (not formally assessed)                                           Pertinent Vitals/Pain Pain Assessment Pain Assessment: 0-10 Pain Score: 5  Pain Location: shoulder Pain Descriptors / Indicators: Aching, Discomfort, Grimacing Pain Intervention(s): Limited activity within patient's tolerance, Monitored during session, Repositioned    Home Living Family/patient expects to be discharged to:: Private residence Living Arrangements: Spouse/significant other Available Help at Discharge: Family;Available PRN/intermittently Type of Home: House Home Access: Level entry       Home Layout: One level Home Equipment: None      Prior Function Prior Level of Function : Independent/Modified Independent                     Extremity/Trunk Assessment   Upper Extremity Assessment Upper Extremity Assessment: Defer to OT evaluation LUE Deficits / Details: NWB, assisted with sling LUE: Unable to fully assess due to immobilization LUE Sensation: WNL LUE Coordination: decreased fine motor    Lower Extremity Assessment Lower Extremity  Assessment: Overall WFL for tasks assessed    Cervical / Trunk Assessment Cervical / Trunk Assessment: Normal  Communication   Communication Communication: No apparent difficulties  Cognition Arousal: Alert Behavior During Therapy: WFL for tasks assessed/performed Overall Cognitive Status: Within Functional Limits for tasks assessed                                          General Comments General comments (skin integrity, edema,  etc.): VSS on RA    Exercises     Assessment/Plan    PT Assessment Patient does not need any further PT services  PT Problem List         PT Treatment Interventions      PT Goals (Current goals can be found in the Care Plan section)  Acute Rehab PT Goals Patient Stated Goal: return home PT Goal Formulation: With patient Time For Goal Achievement: 01/11/23 Potential to Achieve Goals: Good    Frequency       Co-evaluation               AM-PAC PT "6 Clicks" Mobility  Outcome Measure Help needed turning from your back to your side while in a flat bed without using bedrails?: None Help needed moving from lying on your back to sitting on the side of a flat bed without using bedrails?: None Help needed moving to and from a bed to a chair (including a wheelchair)?: None Help needed standing up from a chair using your arms (e.g., wheelchair or bedside chair)?: None Help needed to walk in hospital room?: None Help needed climbing 3-5 steps with a railing? : None 6 Click Score: 24    End of Session Equipment Utilized During Treatment: Other (comment) (sling) Activity Tolerance: Patient tolerated treatment well Patient left: in bed;with call bell/phone within reach;with family/visitor present Nurse Communication: Mobility status PT Visit Diagnosis: Other abnormalities of gait and mobility (R26.89)    Time: 4696-2952 PT Time Calculation (min) (ACUTE ONLY): 5 min   Charges:   PT Evaluation $PT Eval Low Complexity: 1 Low   PT General Charges $$ ACUTE PT VISIT: 1 Visit         11:20 AM, 01/11/23 Wyman Songster PT, DPT Physical Therapist at [facility]

## 2023-01-11 NOTE — Discharge Instructions (Signed)
?Mark A. Amedeo Kinsman, MD MS ?[place] ?[address] ?[place],  [place]  [address] ?Phone: [phone] ?Fax: [phone] ? ? ? ?POST-OPERATIVE INSTRUCTIONS - TOTAL SHOULDER REPLACEMENT  ? ? ?WOUND CARE ?You may leave the operative dressing in place until your follow-up appointment. ?KEEP THE INCISIONS CLEAN AND DRY. ?There may be a small amount of fluid/bleeding leaking at the surgical site. This is normal after surgery.  ?If it fills with liquid or blood please call us immediately to change it for you. ?Use the provided ice machine or Ice packs as often as possible for the first 3-4 days, then as needed for pain relief.  Keep a layer of cloth or a shirt between your skin and the cooling unit to prevent frost bite as it can get very cold. ? ?SHOWERING: ?- You may shower on Post-Op Day #3.  ?- The dressing is water resistant but do not scrub it as it may start to peel up.   ?- You may remove the sling for showering, but keep a water resistant pillow under the arm to keep both the  elbow and shoulder away from the body (mimicking the abduction sling).  ?- Gently pat the area dry.  ?- Do not soak the shoulder in water. Do not go swimming in the pool or ocean until your sutures are removed. ?- KEEP THE INCISIONS CLEAN AND DRY. ? ?EXERCISES ?Wear the sling at all times except when doing your exercises. You may remove the sling for showering, but keep the arm across the chest or in a secondary sling.    ?Accidental/Purposeful External Rotation and shoulder flexion (reaching behind you) is to be avoided at all costs for the first month. ?It is ok to come out of your sling if your are sitting and have assistance for eating.  Do not lift anything heavier than 1 pound until we discuss it further in clinic. ?Please perform the exercises:   ?Elbow / Hand / Wrist  Range of Motion Exercises ?Grip strengthening  ? ?REGIONAL ANESTHESIA (NERVE BLOCKS) ?The anesthesia team may have performed a nerve block  for you if safe in the setting of your care.  This is a great tool used to minimize pain.  Typically the block may start wearing off overnight but the long acting medicine may last for 3-4 days.  The nerve block wearing off can be a challenging period but please utilize your as needed pain medications to try and manage this period.   ? ?POST-OP MEDICATIONS- Multimodal approach to pain control ?In general your pain will be controlled with a combination of substances.  Prescriptions unless otherwise discussed are electronically sent to your pharmacy.  This is a carefully made plan we use to minimize narcotic use.    ? ?Meloxicam OR Celebrex - Anti-inflammatory medication taken on a scheduled basis ?Acetaminophen - Non-narcotic pain medicine taken on a scheduled basis  ?Oxycodone - This is a strong narcotic, to be used only on an ?as needed? basis for pain. ?Aspirin '81mg'$  - This medicine is used to minimize the risk of blood clots after surgery. ?Zofran -  take as needed for nausea ? ?Meloxicam/Celebrex - these are anti-inflammatory and pain relievers.  Do not take additional ibuprofen, naproxen or other NSAID while taking this medicine.  ? ?FOLLOW-UP ?If you develop a Fever (>101.5), Redness or Drainage from the surgical incision site, please call our office to arrange for an evaluation. ?Please call the office to schedule a follow-up appointment  for a wound check, 7-10 days post-operatively. ? ?

## 2023-01-11 NOTE — Addendum Note (Signed)
Addended by: Baird Kay on: 01/11/2023 11:47 AM   Modules accepted: Orders

## 2023-01-11 NOTE — Evaluation (Signed)
Occupational Therapy Evaluation Patient Details Name: [name] [name] MRN: [id number] DOB: August 24, 1965 Today's Date: 01/11/2023   History of Present Illness 57 y.o. F admitted on 01/10/23 due to advanced arthritis in the L glenohumeral joint. She is now s/p anatomic total shoulder arthroplasty. PMH significant for HTN, OSA, OA, hypokalemia.   Clinical Impression   Pt admitted for procedure listed above. PTA pt was independent with all ADL's and IADL's. At this time pt is limited by pain and shoulder immobilization. Her functional mobility is independent, however she requires assist with all UB ADL's due to her shoulder deficits. OT reviewed donning and doffing the sling with pt and husband, as well as compensatory strategies for dressing and bathing. Pt required min to mod assist for bathing and dressing, as well as max assist with her sling. Recommending pt follow up with OP OT following MD orders. Pt has no further acute OT concerns and will be discharged from OT.        If plan is discharge home, recommend the following: A little help with bathing/dressing/bathroom;Assistance with cooking/housework    Functional Status Assessment  Patient has had a recent decline in their functional status and demonstrates the ability to make significant improvements in function in a reasonable and predictable amount of time.  Equipment Recommendations  None recommended by OT    Recommendations for Other Services       Precautions / Restrictions Precautions Precautions: Shoulder Type of Shoulder Precautions: Arthoplasty Shoulder Interventions: Shoulder sling/immobilizer;Shoulder abduction pillow;At all times;Off for dressing/bathing/exercises Precaution Booklet Issued: No Precaution Comments: NWB, Remain in immobilizer at all times Required Braces or Orthoses: Sling Restrictions Weight Bearing Restrictions: Yes LUE Weight Bearing: Non weight bearing      Mobility Bed Mobility Overal bed  mobility: Independent                  Transfers Overall transfer level: Independent Equipment used: None                      Balance Overall balance assessment: No apparent balance deficits (not formally assessed)                                         ADL either performed or assessed with clinical judgement   ADL Overall ADL's : Needs assistance/impaired Eating/Feeding: Independent   Grooming: Set up   Upper Body Bathing: Minimal assistance;Sitting;Adhering to UE precautions   Lower Body Bathing: Minimal assistance;Cueing for compensatory techniques;Sitting/lateral leans;Sit to/from stand   Upper Body Dressing : Moderate assistance;Adhering to UE precautions;Sitting   Lower Body Dressing: Minimal assistance;Sit to/from stand;Sitting/lateral leans   Toilet Transfer: Supervision/safety   Toileting- Clothing Manipulation and Hygiene: Supervision/safety       Functional mobility during ADLs: Independent General ADL Comments: Pt required increased assist for washing off, UB dressing, and donning and doffing the sling with the abduction pillow.     Vision Baseline Vision/History: 0 No visual deficits Ability to See in Adequate Light: 0 Adequate Patient Visual Report: No change from baseline Vision Assessment?: No apparent visual deficits     Perception         Praxis         Pertinent Vitals/Pain Pain Assessment Pain Assessment: 0-10 Pain Score: 5  Pain Location: shoulder Pain Descriptors / Indicators: Aching, Discomfort, Grimacing Pain Intervention(s): Limited activity within patient's tolerance, Monitored during session, Repositioned  Extremity/Trunk Assessment Upper Extremity Assessment Upper Extremity Assessment: LUE deficits/detail LUE Deficits / Details: NWB, assisted with sling LUE: Unable to fully assess due to immobilization LUE Sensation: WNL LUE Coordination: decreased fine motor   Lower Extremity  Assessment Lower Extremity Assessment: Defer to PT evaluation   Cervical / Trunk Assessment Cervical / Trunk Assessment: Normal   Communication Communication Communication: No apparent difficulties   Cognition Arousal: Alert Behavior During Therapy: WFL for tasks assessed/performed Overall Cognitive Status: Within Functional Limits for tasks assessed                                       General Comments  VSS on RA    Exercises     Shoulder Instructions      Home Living Family/patient expects to be discharged to:: Private residence Living Arrangements: Spouse/significant other Available Help at Discharge: Family;Available PRN/intermittently Type of Home: House Home Access: Level entry     Home Layout: One level     Bathroom Shower/Tub: Chief Strategy Officer: Standard     Home Equipment: None          Prior Functioning/Environment Prior Level of Function : Independent/Modified Independent                        OT Problem List: Decreased strength;Decreased range of motion;Decreased activity tolerance;Decreased coordination;Impaired UE functional use;Pain      OT Treatment/Interventions: Self-care/ADL training;Therapeutic exercise;Energy conservation;DME and/or AE instruction;Manual therapy;Therapeutic activities;Patient/family education    OT Goals(Current goals can be found in the care plan section) Acute Rehab OT Goals Patient Stated Goal: To get back to normal OT Goal Formulation: With patient Time For Goal Achievement: 01/25/23 Potential to Achieve Goals: Good  OT Frequency: Min 2X/week    Co-evaluation              AM-PAC OT "6 Clicks" Daily Activity     Outcome Measure Help from another person eating meals?: None Help from another person taking care of personal grooming?: A Little Help from another person toileting, which includes using toliet, bedpan, or urinal?: None Help from another person bathing  (including washing, rinsing, drying)?: A Little Help from another person to put on and taking off regular upper body clothing?: A Lot Help from another person to put on and taking off regular lower body clothing?: A Little 6 Click Score: 19   End of Session Equipment Utilized During Treatment: Other (comment) (Shoulder immobilizer) Nurse Communication: Mobility status  Activity Tolerance: Patient tolerated treatment well Patient left: in bed;with call bell/phone within reach;with family/visitor present  OT Visit Diagnosis: Muscle weakness (generalized) (M62.81);Pain Pain - Right/Left: Left Pain - part of body: Shoulder                Time: 2130-8657 OT Time Calculation (min): 39 min Charges:  OT General Charges $OT Visit: 1 Visit OT Evaluation $OT Eval Low Complexity: 1 Low OT Treatments $Self Care/Home Management : 23-37 mins  Trish Mage, OTR/L [facility] Acute Rehab  Jareth Pardee Elane Bing Plume 01/11/2023, 8:44 AM

## 2023-01-11 NOTE — Anesthesia Postprocedure Evaluation (Signed)
Anesthesia Post Note  Patient: [name] [name]  Procedure(s) Performed: TOTAL SHOULDER ARTHROPLASTY (Left: Shoulder)  Patient location during evaluation: Phase II Anesthesia Type: General Level of consciousness: awake Pain management: pain level controlled Vital Signs Assessment: post-procedure vital signs reviewed and stable Respiratory status: spontaneous breathing and respiratory function stable Cardiovascular status: blood pressure returned to baseline and stable Postop Assessment: no headache and no apparent nausea or vomiting Anesthetic complications: no Comments: Late entry   No notable events documented.   Last Vitals:  Vitals:   01/10/23 2253 01/11/23 0319  BP: 127/75 130/72  Pulse:  99  Resp: 18 20  Temp: 36.9 C 36.7 C  SpO2: 96% 100%    Last Pain:  Vitals:   01/11/23 0833  TempSrc:   PainSc: 6                  Windell Norfolk

## 2023-01-11 NOTE — Discharge Summary (Signed)
Patient ID: [name] [name] MRN: [id number] DOB/AGE: 56-01-1966 56 y.o.  Admit date: 01/10/2023 Discharge date: 01/11/2023  Admission Diagnoses:Left glenohumeral arthritis  Discharge Diagnoses:  Principal Problem:   Glenohumeral arthritis, left   Past Medical History:  Diagnosis Date   Allergy    Phreesia 09/19/2020   Anxiety    Bradycardia 03/02/2015   Depression    GERD (gastroesophageal reflux disease)    History of hysterectomy    History of oophorectomy    Hx of cholecystectomy    Hypertension    IDA (iron deficiency anemia)    Migraines    Near syncope 03/02/2015   Sciatica neuralgia, right    Stroke (HCC)    "light stroke 08/2015. No deficits   Tobacco abuse 03/03/2015   Tobacco abuse 03/03/2015   Vertigo      Procedures Performed: Left Anatomic Total Shoulder Arthroplasty  Discharged Condition: good  Hospital Course: Patient brought in as an outpatient for surgery.  Tolerated procedure well.  Was kept for monitoring overnight for pain control and medical monitoring postop and was found to be stable for DC home the morning after surgery.  Patient was evaluated by OT/OT prior to discharge.  Patient was instructed on specific activity restrictions and all questions were answered.  Patient was discharged on POD#1 in stable condition.  They will contact the clinic if they have any concerns upon discharge.    Consults: None  Significant Diagnostic Studies: No additional pertinent studies  Treatments: Surgery  Discharge Exam:  Alert and oriented, no acute distress  Sling and ice machine fitting appropriately. Dressing is clean dry and intact Active motion intact throughout the hand Sensation intact in the axillary nerve distribution. Fingers are warm and well perfused   CBC    Component Value Date/Time   WBC 4.9 12/19/2022 0837   RBC 4.60 12/19/2022 0837   HGB 11.0 (L) 01/10/2023 1141   HGB 13.3 04/30/2022 1518   HCT 35.2 (L) 01/10/2023 1141   HCT  40.4 04/30/2022 1518   PLT 226 12/19/2022 0837   PLT 237 04/30/2022 1518   MCV 88.7 12/19/2022 0837   MCV 92 04/30/2022 1518   MCH 29.6 12/19/2022 0837   MCHC 33.3 12/19/2022 0837   RDW 12.8 12/19/2022 0837   RDW 12.7 04/30/2022 1518   LYMPHSABS 2.5 04/30/2022 1518   MONOABS 0.4 06/30/2021 0802   EOSABS 0.1 04/30/2022 1518   BASOSABS 0.0 04/30/2022 1518       Disposition: Discharge disposition: 01-Home or Self Care        Allergies as of 01/11/2023       Reactions   Sulfa Antibiotics Hives, Nausea And Vomiting        Medication List     STOP taking these medications    traMADol 50 MG tablet Commonly known as: ULTRAM       TAKE these medications    acetaminophen 500 MG tablet Commonly known as: TYLENOL Take 2 tablets (1,000 mg total) by mouth every 8 (eight) hours for 14 days.   Adderall XR 30 MG 24 hr capsule Generic drug: amphetamine-dextroamphetamine Take 30 mg by mouth daily.   amphetamine-dextroamphetamine 30 MG tablet Commonly known as: ADDERALL Take 30 mg by mouth daily.   albuterol 108 (90 Base) MCG/ACT inhaler Commonly known as: VENTOLIN HFA TAKE 2 PUFFS BY MOUTH EVERY 6 HOURS AS NEEDED FOR WHEEZE OR SHORTNESS OF BREATH   ALPRAZolam 1 MG tablet Commonly known as: XANAX Take 1 mg by mouth 4 (four) times  daily as needed for anxiety.   amLODipine 10 MG tablet Commonly known as: NORVASC Take 1 tablet (10 mg total) by mouth daily.   aspirin EC 81 MG tablet Take 1 tablet (81 mg total) by mouth in the morning and at bedtime. Swallow whole.   cetirizine 10 MG tablet Commonly known as: ZYRTEC TAKE 1 TABLET BY MOUTH EVERY DAY   CO Q-10 PO Take 1 capsule by mouth daily.   cyclobenzaprine 10 MG tablet Commonly known as: FLEXERIL Take 1 tablet (10 mg total) by mouth 2 (two) times daily as needed.   estradiol 0.5 MG tablet Commonly known as: ESTRACE Take 0.5 mg by mouth daily.   meloxicam 15 MG tablet Commonly known as: MOBIC Take 1  tablet (15 mg total) by mouth daily for 14 days.   Misc. Devices Misc Blood pressure cuff/device - 1. ICD10: I10   ondansetron 4 MG tablet Commonly known as: Zofran Take 1 tablet (4 mg total) by mouth every 8 (eight) hours as needed for up to 14 days for nausea or vomiting.   oxyCODONE 5 MG immediate release tablet Commonly known as: Roxicodone Take 1 tablet (5 mg total) by mouth every 4 (four) hours as needed for up to 7 days.   pantoprazole 40 MG tablet Commonly known as: PROTONIX TAKE (1) TABLET BY MOUTH ONCE DAILY.   potassium chloride SA 20 MEQ tablet Commonly known as: KLOR-CON M Take 1 tablet (20 mEq total) by mouth 2 (two) times daily for 14 days.   pravastatin 10 MG tablet Commonly known as: PRAVACHOL TAKE (1) TABLET BY MOUTH ONCE DAILY.   spironolactone 25 MG tablet Commonly known as: ALDACTONE Take 1 tablet (25 mg total) by mouth daily.   Vitamin D (Ergocalciferol) 1.25 MG (50000 UNIT) Caps capsule Commonly known as: DRISDOL TAKE 1 CAPSULE BY MOUTH EVERY 7 DAYS.        Follow-up Information     Oliver Barre, MD. Schedule an appointment as soon as possible for a visit.   Specialties: Orthopedic Surgery, Sports Medicine Why: 10-14 days for suture removal/incision check Contact information: [address] [place] [address] [phone]

## 2023-01-14 ENCOUNTER — Encounter: Payer: Self-pay | Admitting: Orthopedic Surgery

## 2023-01-16 ENCOUNTER — Ambulatory Visit: Payer: BC Managed Care – PPO | Admitting: Orthopedic Surgery

## 2023-01-18 ENCOUNTER — Encounter: Payer: Self-pay | Admitting: Internal Medicine

## 2023-01-18 ENCOUNTER — Ambulatory Visit (HOSPITAL_COMMUNITY): Payer: BC Managed Care – PPO | Attending: Orthopedic Surgery | Admitting: Occupational Therapy

## 2023-01-18 ENCOUNTER — Other Ambulatory Visit: Payer: Self-pay

## 2023-01-18 ENCOUNTER — Encounter (HOSPITAL_COMMUNITY): Payer: Self-pay | Admitting: Occupational Therapy

## 2023-01-18 DIAGNOSIS — G4733 Obstructive sleep apnea (adult) (pediatric): Secondary | ICD-10-CM | POA: Diagnosis not present

## 2023-01-18 DIAGNOSIS — M25512 Pain in left shoulder: Secondary | ICD-10-CM | POA: Diagnosis not present

## 2023-01-18 DIAGNOSIS — M25612 Stiffness of left shoulder, not elsewhere classified: Secondary | ICD-10-CM | POA: Insufficient documentation

## 2023-01-18 DIAGNOSIS — Z96612 Presence of left artificial shoulder joint: Secondary | ICD-10-CM | POA: Insufficient documentation

## 2023-01-18 DIAGNOSIS — R29898 Other symptoms and signs involving the musculoskeletal system: Secondary | ICD-10-CM | POA: Diagnosis not present
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
## 2023-01-22 ENCOUNTER — Ambulatory Visit (HOSPITAL_COMMUNITY): Payer: BC Managed Care – PPO | Attending: Orthopedic Surgery | Admitting: Occupational Therapy

## 2023-01-22 ENCOUNTER — Encounter (HOSPITAL_COMMUNITY): Payer: Self-pay | Admitting: Occupational Therapy

## 2023-01-22 DIAGNOSIS — M25512 Pain in left shoulder: Secondary | ICD-10-CM | POA: Diagnosis not present

## 2023-01-22 DIAGNOSIS — R29898 Other symptoms and signs involving the musculoskeletal system: Secondary | ICD-10-CM | POA: Insufficient documentation

## 2023-01-22 DIAGNOSIS — M25612 Stiffness of left shoulder, not elsewhere classified: Secondary | ICD-10-CM | POA: Diagnosis not present

## 2023-01-22 NOTE — Patient Instructions (Signed)
1) SHOULDER: Flexion On Table   Place hands on towel placed on table, elbows straight. Lean forward with you upper body, pushing towel away from body.  __10_ reps per set, __3_ sets per day  2) Abduction (Passive)   With arm out to side, resting on towel placed on table with palm DOWN, keeping trunk away from table, lean to the side while pushing towel away from body.  Repeat __10__ times. Do __3__ sessions per day.  Copyright  VHI. All rights reserved.     3) Internal Rotation (Assistive)   Seated with elbow bent at right angle and held against side, slide arm on table surface in an inward arc keeping elbow anchored in place. Repeat __10__ times. Do ___3_ sessions per day. Activity: Use this motion to brush crumbs off the table.  Copyright  VHI. All rights reserved.   
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
## 2023-01-23 ENCOUNTER — Encounter: Payer: Self-pay | Admitting: Orthopedic Surgery

## 2023-01-23 ENCOUNTER — Ambulatory Visit (INDEPENDENT_AMBULATORY_CARE_PROVIDER_SITE_OTHER): Payer: BC Managed Care – PPO | Admitting: Orthopedic Surgery

## 2023-01-23 ENCOUNTER — Other Ambulatory Visit (INDEPENDENT_AMBULATORY_CARE_PROVIDER_SITE_OTHER): Payer: BC Managed Care – PPO

## 2023-01-23 DIAGNOSIS — Z96612 Presence of left artificial shoulder joint: Secondary | ICD-10-CM

## 2023-01-23 NOTE — Progress Notes (Signed)
Orthopaedic Postop Note  Assessment: [name] [name] is a 57 y.o. female s/p Left Anatomic Total Shoulder Arthroplasty  DOS: 01/10/2023  Plan: Sutures were trimmed, steri strips were placed Ok to remove the abduction pillow; can stop using the sling around 4 weeks postop Physical therapy prescription and protocol provided Anticipated progression discussed, XR reviewed in clinic Follow up 4 weeks    Follow-up: Return in about 4 weeks (around 02/20/2023).  XR at next visit: Left shoulder  Subjective:  Chief Complaint  Patient presents with   Routine Post Op    L shoulder DOS 01/10/23    History of Present Illness: [name] [name] is a 57 y.o. female who presents following the above stated procedure.  Surgery was approximately 2 weeks ago.  She is doing very well following surgery.  She has not been taking any pain medications.  Tylenol or ibuprofen as needed.  No numbness or tingling.  No issues with her surgical incision.  Review of Systems: No fevers or chills No numbness or tingling No Chest Pain No shortness of breath   Objective: LMP 05/21/1997   Physical Exam:  Alert and oriented.  No acute distress.  Anterior base surgical incision is healing well.  No surrounding erythema or drainage.  Intact sensation in the axillary nerve distribution.  Fingers warm well-perfused.  Sensation intact throughout the left hand.  Minimal bruising.  She tolerates gentle range of motion of the left shoulder.  IMAGING: I personally ordered and reviewed the following images:  XR of the Left shoulder obtained in clinic today and demonstrates shoulder arthroplasty with implants in good position.  No evidence of acute injury or subsidence of implants.   Impression: Left shoulder arthroplasty in good position   Oliver Barre, MD 01/23/2023 4:08 PM

## 2023-01-24 ENCOUNTER — Other Ambulatory Visit: Payer: Self-pay

## 2023-01-24 DIAGNOSIS — M5136 Other intervertebral disc degeneration, lumbar region: Secondary | ICD-10-CM

## 2023-01-24 MED ORDER — CYCLOBENZAPRINE HCL 10 MG PO TABS: 10.0000 mg | ORAL_TABLET | Freq: Two times a day (BID) | ORAL | 1 refills | Status: DC | PRN

## 2023-01-25 ENCOUNTER — Encounter (HOSPITAL_COMMUNITY): Payer: Self-pay | Admitting: Occupational Therapy

## 2023-01-25 ENCOUNTER — Ambulatory Visit (HOSPITAL_COMMUNITY): Payer: BC Managed Care – PPO | Admitting: Occupational Therapy

## 2023-01-25 DIAGNOSIS — M25612 Stiffness of left shoulder, not elsewhere classified: Secondary | ICD-10-CM

## 2023-01-25 DIAGNOSIS — R29898 Other symptoms and signs involving the musculoskeletal system: Secondary | ICD-10-CM

## 2023-01-25 DIAGNOSIS — M25512 Pain in left shoulder: Secondary | ICD-10-CM | POA: Diagnosis not present
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
## 2023-01-29 ENCOUNTER — Ambulatory Visit (HOSPITAL_COMMUNITY): Payer: BC Managed Care – PPO | Admitting: Occupational Therapy

## 2023-01-29 ENCOUNTER — Encounter (HOSPITAL_COMMUNITY): Payer: Self-pay | Admitting: Occupational Therapy

## 2023-01-29 DIAGNOSIS — M25612 Stiffness of left shoulder, not elsewhere classified: Secondary | ICD-10-CM | POA: Diagnosis not present

## 2023-01-29 DIAGNOSIS — M25512 Pain in left shoulder: Secondary | ICD-10-CM | POA: Diagnosis not present

## 2023-01-29 DIAGNOSIS — R29898 Other symptoms and signs involving the musculoskeletal system: Secondary | ICD-10-CM
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
## 2023-01-31 ENCOUNTER — Ambulatory Visit (HOSPITAL_COMMUNITY): Payer: BC Managed Care – PPO | Admitting: Occupational Therapy

## 2023-01-31 ENCOUNTER — Encounter (HOSPITAL_COMMUNITY): Payer: Self-pay | Admitting: Occupational Therapy

## 2023-01-31 DIAGNOSIS — M25512 Pain in left shoulder: Secondary | ICD-10-CM | POA: Diagnosis not present

## 2023-01-31 DIAGNOSIS — M25612 Stiffness of left shoulder, not elsewhere classified: Secondary | ICD-10-CM | POA: Diagnosis not present

## 2023-01-31 DIAGNOSIS — R29898 Other symptoms and signs involving the musculoskeletal system: Secondary | ICD-10-CM
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
## 2023-02-05 ENCOUNTER — Ambulatory Visit (HOSPITAL_COMMUNITY): Payer: BC Managed Care – PPO | Admitting: Occupational Therapy

## 2023-02-05 DIAGNOSIS — M25612 Stiffness of left shoulder, not elsewhere classified: Secondary | ICD-10-CM

## 2023-02-05 DIAGNOSIS — M25512 Pain in left shoulder: Secondary | ICD-10-CM | POA: Diagnosis not present

## 2023-02-05 DIAGNOSIS — R29898 Other symptoms and signs involving the musculoskeletal system: Secondary | ICD-10-CM
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
## 2023-02-07 ENCOUNTER — Telehealth: Payer: Self-pay | Admitting: Internal Medicine

## 2023-02-07 NOTE — Telephone Encounter (Signed)
[facility] work [employer]  Noted Copied Sleeved   Call patient when has been faxed to [phone]

## 2023-02-08 ENCOUNTER — Encounter (HOSPITAL_COMMUNITY): Payer: Self-pay | Admitting: Occupational Therapy

## 2023-02-08 ENCOUNTER — Ambulatory Visit (HOSPITAL_COMMUNITY): Payer: BC Managed Care – PPO | Admitting: Occupational Therapy

## 2023-02-08 DIAGNOSIS — R29898 Other symptoms and signs involving the musculoskeletal system: Secondary | ICD-10-CM

## 2023-02-08 DIAGNOSIS — M25512 Pain in left shoulder: Secondary | ICD-10-CM

## 2023-02-08 DIAGNOSIS — M25612 Stiffness of left shoulder, not elsewhere classified: Secondary | ICD-10-CM | POA: Diagnosis not present

## 2023-02-08 NOTE — Patient Instructions (Signed)
  Complete the following 2 a day. Hold for 10-15 seconds. Complete 5 sets for each.   1) SHOULDER - ISOMETRIC FLEXION  Gently push your fist forward into a wall with your elbow bent.    2) SHOULDER - ISOMETRIC EXTENSION  Gently push your a bent elbow back into a wall.    3) SHOULDER - ISOMETRIC INTERNAL ROTATION   Gently press your hand into a wall using the palm side of your hand.  Maintain a bent elbow the entire time.        4) SHOULDER - ISOMETRIC ADDUCTION  Gently push your elbow into the side of your body.   5) SHOULDER - ISOMETRIC ABDUCTION  Gently push your elbow out to the side into a wall with your elbow bent.     Perform each exercise ____10-15____ reps. 2-3x days.   1) Protraction   Start by holding a wand or cane at chest height.  Next, slowly push the wand outwards in front of your body so that your elbows become fully straightened. Then, return to the original position.     2) Shoulder FLEXION   In the standing position, hold a wand/cane with both arms, palms down on both sides. Raise up the wand/cane allowing your unaffected arm to perform most of the effort. Your affected arm should be partially relaxed.      3) Internal/External ROTATION   In the standing position, hold a wand/cane with both hands keeping your elbows bent. Move your arms and wand/cane to one side.  Your affected arm should be partially relaxed while your unaffected arm performs most of the effort.       4) Shoulder ABDUCTION   While holding a wand/cane palm face up on the injured side and palm face down on the uninjured side, slowly raise up your injured arm to the side.        5) Horizontal Abduction/Adduction      Straight arms holding cane at shoulder height, bring cane to right, center, left. Repeat starting to left.   Copyright  VHI. All rights reserved.
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
## 2023-02-11 ENCOUNTER — Other Ambulatory Visit: Payer: Self-pay | Admitting: Orthopedic Surgery

## 2023-02-11 ENCOUNTER — Encounter (HOSPITAL_COMMUNITY): Payer: Self-pay | Admitting: Occupational Therapy

## 2023-02-11 ENCOUNTER — Ambulatory Visit (HOSPITAL_COMMUNITY): Payer: BC Managed Care – PPO | Admitting: Occupational Therapy

## 2023-02-11 ENCOUNTER — Other Ambulatory Visit: Payer: Self-pay | Admitting: Internal Medicine

## 2023-02-11 DIAGNOSIS — M25512 Pain in left shoulder: Secondary | ICD-10-CM

## 2023-02-11 DIAGNOSIS — M25612 Stiffness of left shoulder, not elsewhere classified: Secondary | ICD-10-CM | POA: Diagnosis not present

## 2023-02-11 DIAGNOSIS — E782 Mixed hyperlipidemia: Secondary | ICD-10-CM

## 2023-02-11 DIAGNOSIS — R29898 Other symptoms and signs involving the musculoskeletal system: Secondary | ICD-10-CM

## 2023-02-11 NOTE — Telephone Encounter (Signed)
Paper work filled out  Faxed to number on form  Copied Faxed to [employer]  Patient notified
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
## 2023-02-14 ENCOUNTER — Encounter (HOSPITAL_COMMUNITY): Payer: BC Managed Care – PPO | Admitting: Occupational Therapy

## 2023-02-18 DIAGNOSIS — G4733 Obstructive sleep apnea (adult) (pediatric): Secondary | ICD-10-CM | POA: Diagnosis not present

## 2023-02-18 NOTE — Telephone Encounter (Signed)
Spouse Shavonta Gossen picked up form

## 2023-02-19 ENCOUNTER — Other Ambulatory Visit: Payer: Self-pay | Admitting: Orthopedic Surgery

## 2023-02-19 DIAGNOSIS — M51369 Other intervertebral disc degeneration, lumbar region without mention of lumbar back pain or lower extremity pain: Secondary | ICD-10-CM

## 2023-02-20 ENCOUNTER — Ambulatory Visit (HOSPITAL_COMMUNITY): Payer: BC Managed Care – PPO | Attending: Orthopedic Surgery | Admitting: Occupational Therapy

## 2023-02-20 ENCOUNTER — Ambulatory Visit (INDEPENDENT_AMBULATORY_CARE_PROVIDER_SITE_OTHER): Payer: BC Managed Care – PPO | Admitting: Orthopedic Surgery

## 2023-02-20 ENCOUNTER — Ambulatory Visit (INDEPENDENT_AMBULATORY_CARE_PROVIDER_SITE_OTHER): Payer: BC Managed Care – PPO

## 2023-02-20 ENCOUNTER — Encounter (HOSPITAL_COMMUNITY): Payer: Self-pay | Admitting: Occupational Therapy

## 2023-02-20 DIAGNOSIS — R29898 Other symptoms and signs involving the musculoskeletal system: Secondary | ICD-10-CM | POA: Insufficient documentation

## 2023-02-20 DIAGNOSIS — M25612 Stiffness of left shoulder, not elsewhere classified: Secondary | ICD-10-CM | POA: Diagnosis not present

## 2023-02-20 DIAGNOSIS — Z96612 Presence of left artificial shoulder joint: Secondary | ICD-10-CM | POA: Diagnosis not present

## 2023-02-20 DIAGNOSIS — M25512 Pain in left shoulder: Secondary | ICD-10-CM | POA: Diagnosis not present

## 2023-02-20 NOTE — Progress Notes (Unsigned)
Orthopaedic Postop Note  Assessment: [name] [name] is a 57 y.o. female s/p Left Anatomic Total Shoulder Arthroplasty  DOS: 01/10/2023  Plan: [name] [name] is doing well.  Minimal pain.  She is working with PT.  Encouraged her to continue with ROM activities.  She should remove the sling.  Ok for her to drive, but she needs to do so carefully.  She will return to work in a couple of weeks.  Follow up in 6 weeks.    Follow-up: Return in about 6 weeks (around 04/03/2023).  XR at next visit: Left shoulder  Subjective:  Chief Complaint  Patient presents with   Routine Post Op    L DOS 01/15/23    History of Present Illness: [name] [name] is a 57 y.o. female who presents following the above stated procedure.  Surgery was approximately 6 weeks ago.  She has done well.  Pain is controlled.  No numbness or tingling.  She is working with PT.  She continues to wear the sling because no one told her to remove it.   Review of Systems: No fevers or chills No numbness or tingling No Chest Pain No shortness of breath   Objective: LMP 05/21/1997   Physical Exam:  Alert and oriented.  No acute distress.  Surgical incision is healed.  Slightly hypertrophic scar.  Sensation intact in the axillary nerve distribution.  Sensation intact throughout the right hand.  She tolerates passive forward flexion to 130, abduction to 100.    IMAGING: I personally ordered and reviewed the following images:  XR of the left shoulder was obtained in clinic today.  This is compared to prior XR.  No change in the overall position of the humeral prosthesis.  No fracture.  No subsidence.  No periprosthetic lucency.  Marker for glenoid is visible and in unchanged position.   Impression:  Stable left shoulder arthroplasty in unchanged position.    Oliver Barre, MD 02/20/2023 3:49 PM
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
## 2023-02-21 ENCOUNTER — Telehealth: Payer: Self-pay | Admitting: Internal Medicine

## 2023-02-21 ENCOUNTER — Encounter: Payer: Self-pay | Admitting: Orthopedic Surgery

## 2023-02-21 ENCOUNTER — Encounter: Payer: Self-pay | Admitting: Internal Medicine

## 2023-02-21 NOTE — Telephone Encounter (Signed)
Pt called in requesting a call back  Has run out BP medication prescribed by Hospital wants to discus if she needs to take medication

## 2023-02-22 ENCOUNTER — Encounter (HOSPITAL_COMMUNITY): Payer: BC Managed Care – PPO | Admitting: Occupational Therapy

## 2023-02-22 NOTE — Telephone Encounter (Signed)
Patient sent mychart message.

## 2023-02-25 ENCOUNTER — Other Ambulatory Visit: Payer: Self-pay | Admitting: Internal Medicine

## 2023-02-25 DIAGNOSIS — I10 Essential (primary) hypertension: Secondary | ICD-10-CM

## 2023-02-25 MED ORDER — SPIRONOLACTONE 25 MG PO TABS: 25.0000 mg | ORAL_TABLET | Freq: Every day | ORAL | 3 refills | Status: DC

## 2023-03-01 ENCOUNTER — Ambulatory Visit (HOSPITAL_COMMUNITY): Payer: BC Managed Care – PPO | Admitting: Occupational Therapy

## 2023-03-01 ENCOUNTER — Encounter (HOSPITAL_COMMUNITY): Payer: Self-pay | Admitting: Occupational Therapy

## 2023-03-01 ENCOUNTER — Encounter: Payer: Self-pay | Admitting: Orthopedic Surgery

## 2023-03-01 DIAGNOSIS — M25612 Stiffness of left shoulder, not elsewhere classified: Secondary | ICD-10-CM

## 2023-03-01 DIAGNOSIS — M25512 Pain in left shoulder: Secondary | ICD-10-CM

## 2023-03-01 DIAGNOSIS — R29898 Other symptoms and signs involving the musculoskeletal system: Secondary | ICD-10-CM | POA: Diagnosis not present

## 2023-03-01 NOTE — Patient Instructions (Signed)
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
## 2023-03-03 ENCOUNTER — Other Ambulatory Visit: Payer: Self-pay | Admitting: Internal Medicine

## 2023-03-03 DIAGNOSIS — E782 Mixed hyperlipidemia: Secondary | ICD-10-CM

## 2023-03-04 ENCOUNTER — Encounter (HOSPITAL_COMMUNITY): Payer: BC Managed Care – PPO | Admitting: Occupational Therapy

## 2023-03-06 ENCOUNTER — Encounter (HOSPITAL_COMMUNITY): Payer: BC Managed Care – PPO | Admitting: Occupational Therapy

## 2023-03-08 ENCOUNTER — Ambulatory Visit: Payer: BC Managed Care – PPO | Admitting: Internal Medicine

## 2023-03-08 ENCOUNTER — Encounter (HOSPITAL_COMMUNITY): Payer: Self-pay | Admitting: Occupational Therapy

## 2023-03-08 ENCOUNTER — Ambulatory Visit (HOSPITAL_COMMUNITY): Payer: BC Managed Care – PPO | Admitting: Occupational Therapy

## 2023-03-08 DIAGNOSIS — R29898 Other symptoms and signs involving the musculoskeletal system: Secondary | ICD-10-CM | POA: Diagnosis not present

## 2023-03-08 DIAGNOSIS — M25512 Pain in left shoulder: Secondary | ICD-10-CM

## 2023-03-08 DIAGNOSIS — M25612 Stiffness of left shoulder, not elsewhere classified: Secondary | ICD-10-CM
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
## 2023-03-11 ENCOUNTER — Other Ambulatory Visit: Payer: Self-pay | Admitting: Orthopedic Surgery

## 2023-03-17 ENCOUNTER — Encounter: Payer: Self-pay | Admitting: Orthopedic Surgery

## 2023-03-19 ENCOUNTER — Ambulatory Visit (HOSPITAL_COMMUNITY): Payer: BC Managed Care – PPO | Admitting: Occupational Therapy

## 2023-03-19 ENCOUNTER — Encounter (HOSPITAL_COMMUNITY): Payer: Self-pay | Admitting: Occupational Therapy

## 2023-03-19 DIAGNOSIS — M25612 Stiffness of left shoulder, not elsewhere classified: Secondary | ICD-10-CM

## 2023-03-19 DIAGNOSIS — M25512 Pain in left shoulder: Secondary | ICD-10-CM | POA: Diagnosis not present

## 2023-03-19 DIAGNOSIS — R29898 Other symptoms and signs involving the musculoskeletal system: Secondary | ICD-10-CM | POA: Diagnosis not present
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
## 2023-03-21 ENCOUNTER — Encounter (HOSPITAL_COMMUNITY): Payer: Self-pay | Admitting: Occupational Therapy

## 2023-03-21 ENCOUNTER — Ambulatory Visit (HOSPITAL_COMMUNITY): Payer: BC Managed Care – PPO | Admitting: Occupational Therapy

## 2023-03-21 DIAGNOSIS — R29898 Other symptoms and signs involving the musculoskeletal system: Secondary | ICD-10-CM | POA: Diagnosis not present

## 2023-03-21 DIAGNOSIS — M25612 Stiffness of left shoulder, not elsewhere classified: Secondary | ICD-10-CM | POA: Diagnosis not present

## 2023-03-21 DIAGNOSIS — G4733 Obstructive sleep apnea (adult) (pediatric): Secondary | ICD-10-CM | POA: Diagnosis not present

## 2023-03-21 DIAGNOSIS — M25512 Pain in left shoulder: Secondary | ICD-10-CM | POA: Diagnosis not present
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
## 2023-03-21 NOTE — Patient Instructions (Signed)

## 2023-03-25 ENCOUNTER — Other Ambulatory Visit: Payer: Self-pay | Admitting: Internal Medicine

## 2023-03-25 DIAGNOSIS — K219 Gastro-esophageal reflux disease without esophagitis: Secondary | ICD-10-CM

## 2023-03-26 ENCOUNTER — Encounter (HOSPITAL_COMMUNITY): Payer: Self-pay | Admitting: Occupational Therapy

## 2023-03-26 ENCOUNTER — Ambulatory Visit (HOSPITAL_COMMUNITY): Payer: 59 | Attending: Orthopedic Surgery | Admitting: Occupational Therapy

## 2023-03-26 DIAGNOSIS — R29898 Other symptoms and signs involving the musculoskeletal system: Secondary | ICD-10-CM

## 2023-03-26 DIAGNOSIS — M25512 Pain in left shoulder: Secondary | ICD-10-CM

## 2023-03-26 DIAGNOSIS — M25612 Stiffness of left shoulder, not elsewhere classified: Secondary | ICD-10-CM
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
## 2023-03-28 ENCOUNTER — Ambulatory Visit (HOSPITAL_COMMUNITY): Payer: 59 | Admitting: Occupational Therapy

## 2023-03-28 ENCOUNTER — Encounter (HOSPITAL_COMMUNITY): Payer: Self-pay | Admitting: Occupational Therapy

## 2023-03-28 DIAGNOSIS — M25512 Pain in left shoulder: Secondary | ICD-10-CM

## 2023-03-28 DIAGNOSIS — M25612 Stiffness of left shoulder, not elsewhere classified: Secondary | ICD-10-CM | POA: Diagnosis not present

## 2023-03-28 DIAGNOSIS — R29898 Other symptoms and signs involving the musculoskeletal system: Secondary | ICD-10-CM | POA: Diagnosis not present
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
## 2023-03-30 ENCOUNTER — Other Ambulatory Visit: Payer: Self-pay | Admitting: Internal Medicine

## 2023-03-30 DIAGNOSIS — I10 Essential (primary) hypertension: Secondary | ICD-10-CM

## 2023-04-02 ENCOUNTER — Encounter (HOSPITAL_COMMUNITY): Payer: Self-pay | Admitting: Occupational Therapy

## 2023-04-02 ENCOUNTER — Ambulatory Visit (HOSPITAL_COMMUNITY): Payer: 59 | Admitting: Occupational Therapy

## 2023-04-02 DIAGNOSIS — M25512 Pain in left shoulder: Secondary | ICD-10-CM | POA: Diagnosis not present

## 2023-04-02 DIAGNOSIS — R29898 Other symptoms and signs involving the musculoskeletal system: Secondary | ICD-10-CM

## 2023-04-02 DIAGNOSIS — M25612 Stiffness of left shoulder, not elsewhere classified: Secondary | ICD-10-CM
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
## 2023-04-03 ENCOUNTER — Ambulatory Visit (INDEPENDENT_AMBULATORY_CARE_PROVIDER_SITE_OTHER): Payer: BC Managed Care – PPO | Admitting: Orthopedic Surgery

## 2023-04-03 ENCOUNTER — Encounter: Payer: Self-pay | Admitting: Orthopedic Surgery

## 2023-04-03 ENCOUNTER — Other Ambulatory Visit (INDEPENDENT_AMBULATORY_CARE_PROVIDER_SITE_OTHER): Payer: 59

## 2023-04-03 DIAGNOSIS — M19011 Primary osteoarthritis, right shoulder: Secondary | ICD-10-CM | POA: Diagnosis not present

## 2023-04-03 DIAGNOSIS — M19012 Primary osteoarthritis, left shoulder: Secondary | ICD-10-CM

## 2023-04-03 DIAGNOSIS — Z96612 Presence of left artificial shoulder joint: Secondary | ICD-10-CM | POA: Diagnosis not present

## 2023-04-03 MED ORDER — CYCLOBENZAPRINE HCL 10 MG PO TABS
10.0000 mg | ORAL_TABLET | Freq: Two times a day (BID) | ORAL | 0 refills | Status: DC | PRN
Start: 2023-04-03 — End: 2023-06-03

## 2023-04-03 NOTE — Progress Notes (Signed)
Orthopaedic Postop Note  Assessment: [name] [name] is a 56 y.o. female s/p Left Anatomic Total Shoulder Arthroplasty  DOS: 01/10/2023  Plan: [name] is doing well overall.  She is progressing with PT.  She has yet to initiate strengthening exercises.  Anticipate transition to strengthening soon, which should improve ROM.  Provided reassurance.  Return to work in 1 month.  She is also complaining of right shoulder pain, and has had previous injections that were effective.  Subacromial injection provided today.  Would like see her back in 3 months.  She will call the clinic if she has any further issues.  Procedure note injection - Right shoulder    Verbal consent was obtained to inject the right shoulder, subacromial space Timeout was completed to confirm the site of injection.   The skin was prepped with alcohol and ethyl chloride was sprayed at the injection site.  A 21-gauge needle was used to inject 40 mg of Depo-Medrol and 1% lidocaine (4 cc) into the subacromial space of the right shoulder using a posterolateral approach.  There were no complications.  A sterile bandage was applied.     Follow-up: Return in about 3 months (around 07/04/2023).  XR at next visit: Left shoulder  Subjective:  Chief Complaint  Patient presents with   Routine Post Op    L TSA DOS: 01/10/2023   Injections    R shoulder last injection was 10/30/22    History of Present Illness: [name] [name] is a 57 y.o. female who presents following the above stated procedure.  Surgery was approximately 3 months ago.  She is doing fairly well.  She continues to work with therapy.  She is working at home.  Taking Flexeril, which helps with the spasming type pain.  No numbness or tingling.  She continues to have right shoulder pain, and is requesting an injection.  Review of Systems: No fevers or chills No numbness or tingling No Chest Pain No shortness of breath   Objective: LMP 05/21/1997   Physical  Exam:  Alert and oriented.  No acute distress.  Surgical incision is healed.  Slightly hypertrophic scar.  Sensation intact in the axillary nerve distribution.  Sensation intact throughout the right hand.  She tolerates passive forward flexion to 130, abduction to 100.    Right shoulder without deformity.  No swelling.  Restricted range of motion.  Tenderness to palpation diffusely  IMAGING: I personally ordered and reviewed the following images:  XR of the left shoulder was obtained in clinic today.  This is compared to prior XR.  Anatomic shoulder arthroplasty in stable position.  No change in alignment.  No periprosthetic lucency.  No subsidence.  Marker for glenoid in unchanged position.   Impression: Stable left anatomic shoulder arthroplasty   Oliver Barre, MD 04/03/2023 3:21 PM

## 2023-04-03 NOTE — Patient Instructions (Signed)
Instructions Following Joint Injections  In clinic today, you received an injection in one of your joints (sometimes more than one).  Occasionally, you can have some pain at the injection site, this is normal.  You can place ice at the injection site, or take over-the-counter medications such as Tylenol (acetaminophen) or Advil (ibuprofen).  Please follow all directions listed on the bottle.  If your joint (knee or shoulder) becomes swollen, red or very painful, please contact the clinic for additional assistance.   Two medications were injected, including lidocaine and a steroid (often referred to as cortisone).  Lidocaine is effective almost immediately but wears off quickly.  However, the steroid can take a few days to improve your symptoms.  In some cases, it can make your pain worse for a couple of days.  Do not be concerned if this happens as it is common.  You can apply ice or take some over-the-counter medications as needed.   Injections in the same joint cannot be repeated for 3 months.  This helps to limit the risk of an infection in the joint.  If you were to develop an infection in your joint, the best treatment option would be surgery.    Please provide a note for work - continue with current restrictions for another month

## 2023-04-04 ENCOUNTER — Encounter (HOSPITAL_COMMUNITY): Payer: BC Managed Care – PPO | Admitting: Occupational Therapy

## 2023-04-05 ENCOUNTER — Encounter: Payer: Self-pay | Admitting: Internal Medicine

## 2023-04-05 ENCOUNTER — Ambulatory Visit (INDEPENDENT_AMBULATORY_CARE_PROVIDER_SITE_OTHER): Payer: BC Managed Care – PPO | Admitting: Internal Medicine

## 2023-04-05 VITALS — BP 139/81 | HR 87 | Ht 62.0 in | Wt 182.4 lb

## 2023-04-05 DIAGNOSIS — I10 Essential (primary) hypertension: Secondary | ICD-10-CM | POA: Diagnosis not present

## 2023-04-05 DIAGNOSIS — E66811 Obesity, class 1: Secondary | ICD-10-CM

## 2023-04-05 DIAGNOSIS — Z0001 Encounter for general adult medical examination with abnormal findings: Secondary | ICD-10-CM | POA: Diagnosis not present

## 2023-04-05 DIAGNOSIS — R600 Localized edema: Secondary | ICD-10-CM

## 2023-04-05 DIAGNOSIS — E876 Hypokalemia: Secondary | ICD-10-CM | POA: Diagnosis not present

## 2023-04-05 DIAGNOSIS — Z23 Encounter for immunization: Secondary | ICD-10-CM | POA: Diagnosis not present

## 2023-04-05 DIAGNOSIS — E782 Mixed hyperlipidemia: Secondary | ICD-10-CM | POA: Diagnosis not present

## 2023-04-05 DIAGNOSIS — R7303 Prediabetes: Secondary | ICD-10-CM | POA: Diagnosis not present

## 2023-04-05 DIAGNOSIS — M19012 Primary osteoarthritis, left shoulder: Secondary | ICD-10-CM

## 2023-04-05 DIAGNOSIS — E538 Deficiency of other specified B group vitamins: Secondary | ICD-10-CM

## 2023-04-05 DIAGNOSIS — E559 Vitamin D deficiency, unspecified: Secondary | ICD-10-CM | POA: Diagnosis not present

## 2023-04-05 MED ORDER — SPIRONOLACTONE 25 MG PO TABS: 25.0000 mg | ORAL_TABLET | Freq: Every day | ORAL | 1 refills | Status: DC

## 2023-04-05 MED ORDER — CYANOCOBALAMIN 1000 MCG/ML IJ SOLN
1000.0000 ug | Freq: Once | INTRAMUSCULAR | Status: AC
  Administered 2023-04-05: 1000 ug via INTRAMUSCULAR

## 2023-04-05 MED ORDER — TIRZEPATIDE-WEIGHT MANAGEMENT 2.5 MG/0.5ML ~~LOC~~ SOLN: 2.5000 mg | SUBCUTANEOUS | 0 refills | Status: DC

## 2023-04-05 NOTE — Assessment & Plan Note (Signed)
S/p shoulder arthroplasty

## 2023-04-05 NOTE — Patient Instructions (Signed)
Please start taking Zepbound as prescribed.  Please continue to take medications as prescribed.  Please continue to follow low carb diet and perform moderate exercise/walking at least 150 mins/week.

## 2023-04-05 NOTE — Assessment & Plan Note (Signed)
Lipid profile reviewed Needs to follow low cholesterol diet- DASH diet material provided Has tried Crestor and Lipitor - had myalgias Tolerates pravastatin 10 mg QD If she has leg cramps with it, she can be deemed statin intolerant Advised to take co-Q10 for leg cramps

## 2023-04-05 NOTE — Assessment & Plan Note (Signed)
Had to DC chlorthalidone due to hypokalemia Switched to spironolactone Leg elevation and compression socks as tolerated

## 2023-04-05 NOTE — Assessment & Plan Note (Signed)
BP Readings from Last 1 Encounters:  04/05/23 139/81   Well-controlled with amlodipine 10 mg once daily and spironolactone 25 mg once daily Had to DC chlorthalidone 25 mg QD due to persistent hypokalemia Had started spironolactone for HTN, leg swelling and can also help with hypokalemia Counseled for compliance with the medications Advised DASH diet and moderate exercise/walking, at least 150 mins/week

## 2023-04-05 NOTE — Assessment & Plan Note (Addendum)
Takes  Klor-Con 20 mEq QD Check CMP Advised to start magnesium 400 mg QD On spironolactone 25 mg QD for HTN

## 2023-04-05 NOTE — Assessment & Plan Note (Signed)
Lab Results  Component Value Date   HGBA1C 6.0 (H) 12/19/2022   Advised to follow DASH diet for now Started Zepbound for weight loss

## 2023-04-05 NOTE — Assessment & Plan Note (Signed)
Physical exam as documented. Fasting blood tests today. Denies flu vaccine.  Tdap vaccine today

## 2023-04-05 NOTE — Assessment & Plan Note (Addendum)
BMI Readings from Last 3 Encounters:  04/05/23 33.36 kg/m  01/03/23 32.48 kg/m  12/19/22 32.81 kg/m   Has tried to follow low carb diet and regular exercise Unable to take oral stimulants/appetite suppressants as she takes Adderall for ADD Good candidate for GLP1 agonist Started Zepbound-initial BMI: 33.36, plan to increase dose as tolerated Encouraged to continue to follow low-carb diet and perform moderate exercise/walking at least 150 minutes/week

## 2023-04-05 NOTE — Progress Notes (Signed)
Established Patient Office Visit  Subjective:  Patient ID: [name] [name], female    DOB: 05/14/1966  Age: 57 y.o. MRN: [id number]  CC:  Chief Complaint  Patient presents with   Hypertension    Four month follow up    Pain    Patient states she is having a lot of pain throughout her body she feels it is related to her weight    HPI [name] [name] is a 57 y.o. female with past medical history of HTN,  GERD/gastritis, ADD with anxiety and obesity who presents for annual physical.  HTN: BP is well-controlled. Takes amlodipine 10 mg QD and spironolactone 25 mg QD regularly. Patient denies headache, dizziness, chest pain, dyspnea or palpitations.  Hypokalemia: She takes potassium supplement-20 mEq once daily currently and spironolactone 25 mg QD.  GAD and ADD: She has history of GAD and ADD, followed by psychiatry. She is currently taking Xanax as needed for anxiety. She is also on Adderall for ADD. Her BP is WNL. She reports snoring at nighttime.Had sleep study, showed mild OSA.  She reports diffuse myalgias, and attributes it to her weight gain.  She has been trying to follow low-carb diet.  She has been less active physically recently due to left shoulder arthroplasty.  She is motivated to follow low carb diet and increase physical activity as tolerated.  After discussion of medical weight loss options, she agrees to try Zepbound.   Past Medical History:  Diagnosis Date   Allergy    Phreesia 09/19/2020   Anxiety    Bradycardia 03/02/2015   Depression    GERD (gastroesophageal reflux disease)    History of hysterectomy    History of oophorectomy    Hx of cholecystectomy    Hypertension    IDA (iron deficiency anemia)    Migraines    Near syncope 03/02/2015   Sciatica neuralgia, right    Stroke (HCC)    "light stroke 08/2015. No deficits   Tobacco abuse 03/03/2015   Tobacco abuse 03/03/2015   Vertigo     Past Surgical History:  Procedure Laterality Date   ABDOMINAL  HYSTERECTOMY     BIOPSY  12/04/2016   Procedure: BIOPSY;  Surgeon: West Bali, MD;  Location: [facility];  Service: Endoscopy;;  gastric and duodenal biopsy   BIOPSY  07/30/2017   Procedure: BIOPSY;  Surgeon: West Bali, MD;  Location: [facility];  Service: Endoscopy;;  random colon bx's   CATARACT EXTRACTION W/PHACO Left 05/19/2021   Procedure: CATARACT EXTRACTION PHACO AND INTRAOCULAR LENS PLACEMENT (IOC);  Surgeon: Fabio Pierce, MD;  Location: [facility];  Service: Ophthalmology;  Laterality: Left;  CDE: 3.35   CATARACT EXTRACTION W/PHACO Right 06/09/2021   Procedure: CATARACT EXTRACTION PHACO AND INTRAOCULAR LENS PLACEMENT (IOC);  Surgeon: Fabio Pierce, MD;  Location: [facility];  Service: Ophthalmology;  Laterality: Right;  CDE: 5.99   CESAREAN SECTION     x2   CESAREAN SECTION N/A    Phreesia 09/19/2020   CHOLECYSTECTOMY     COLONOSCOPY WITH ESOPHAGOGASTRODUODENOSCOPY (EGD)  2007   Dr. Juanda Chance: normal   COLONOSCOPY WITH PROPOFOL N/A 07/30/2017   Dr. Darrick Penna: Redundant colon, external/internal hemorrhoids. Colon otherwise normal.  Random colon biopsies negative.   ESOPHAGOGASTRODUODENOSCOPY (EGD) WITH PROPOFOL N/A 12/04/2016   Dr. Darrick Penna: Gastritis but no H.pylori, small bowel bx negative. gastric polyps benign fundic gland   HEMORRHOID SURGERY     TOTAL SHOULDER ARTHROPLASTY Left 01/10/2023   Procedure: TOTAL SHOULDER ARTHROPLASTY;  Surgeon: Oliver Barre, MD;  Location: [facility];  Service: Orthopedics;  Laterality: Left;    Family History  Problem Relation Age of Onset   ALS Mother    Cardiomyopathy Father    Colon cancer Neg Hx     Social History   Socioeconomic History   Marital status: Married    Spouse name: Not on file   Number of children: Not on file   Years of education: Not on file   Highest education level: Not on file  Occupational History   Not on file  Tobacco Use   Smoking status: Former    Current packs/day: 0.00    Average packs/day: 0.1  packs/day for 20.0 years (2.0 ttl pk-yrs)    Types: Cigarettes    Start date: 02/23/2001    Quit date: 02/23/2021    Years since quitting: 2.1   Smokeless tobacco: Never  Vaping Use   Vaping status: Never Used  Substance and Sexual Activity   Alcohol use: Not Currently    Comment: seldom   Drug use: Yes    Frequency: 2.0 times per week    Types: Marijuana    Comment: occ   Sexual activity: Yes    Birth control/protection: Surgical  Other Topics Concern   Not on file  Social History Narrative   Drinks 5-6 cups of coffee daily.   Social Determinants of Health   Financial Resource Strain: Not on file  Food Insecurity: Not on file  Transportation Needs: Not on file  Physical Activity: Not on file  Stress: Not on file  Social Connections: Not on file  Intimate Partner Violence: Not on file    Outpatient Medications Prior to Visit  Medication Sig Dispense Refill   ADDERALL XR 30 MG 24 hr capsule Take 30 mg by mouth daily.     albuterol (VENTOLIN HFA) 108 (90 Base) MCG/ACT inhaler TAKE 2 PUFFS BY MOUTH EVERY 6 HOURS AS NEEDED FOR WHEEZE OR SHORTNESS OF BREATH 8 g 5   ALPRAZolam (XANAX) 1 MG tablet Take 1 mg by mouth 4 (four) times daily as needed for anxiety.     amLODipine (NORVASC) 10 MG tablet Take 1 tablet (10 mg total) by mouth daily. 90 tablet 1   amphetamine-dextroamphetamine (ADDERALL) 30 MG tablet Take 30 mg by mouth daily.     cetirizine (ZYRTEC) 10 MG tablet TAKE 1 TABLET BY MOUTH EVERY DAY 90 tablet 3   Coenzyme Q10 (CO Q-10 PO) Take 1 capsule by mouth daily.     cyclobenzaprine (FLEXERIL) 10 MG tablet Take 1 tablet (10 mg total) by mouth 2 (two) times daily as needed for muscle spasms. 30 tablet 0   estradiol (ESTRACE) 0.5 MG tablet Take 0.5 mg by mouth daily.     Misc. Devices MISC Blood pressure cuff/device - 1. ICD10: I10 1 each 0   pantoprazole (PROTONIX) 40 MG tablet TAKE (1) TABLET BY MOUTH ONCE DAILY. 90 tablet 0   potassium chloride SA (KLOR-CON M) 20 MEQ  tablet Take 1 tablet (20 mEq total) by mouth 2 (two) times daily for 14 days. 28 tablet 0   pravastatin (PRAVACHOL) 10 MG tablet TAKE (1) TABLET BY MOUTH ONCE DAILY. 30 tablet 0   Vitamin D, Ergocalciferol, (DRISDOL) 1.25 MG (50000 UNIT) CAPS capsule TAKE 1 CAPSULE BY MOUTH EVERY 7 DAYS. 12 capsule 1   spironolactone (ALDACTONE) 25 MG tablet TAKE ONE TABLET BY MOUTH EVERY DAY 30 tablet 0   No facility-administered medications prior to visit.  Allergies  Allergen Reactions   Sulfa Antibiotics Hives and Nausea And Vomiting    ROS Review of Systems  Constitutional:  Negative for chills and fever.  HENT:  Negative for congestion, sinus pressure, sinus pain and sore throat.   Eyes:  Negative for pain and discharge.  Respiratory:  Negative for cough and shortness of breath.   Cardiovascular:  Negative for chest pain and palpitations.  Gastrointestinal:  Negative for abdominal pain, diarrhea, nausea and vomiting.  Endocrine: Negative for polydipsia and polyuria.  Genitourinary:  Negative for dysuria and hematuria.  Musculoskeletal:  Positive for arthralgias and back pain. Negative for neck pain and neck stiffness.  Skin:  Negative for rash.  Neurological:  Negative for dizziness and weakness.  Psychiatric/Behavioral:  Negative for agitation and behavioral problems. The patient is nervous/anxious.       Objective:    Physical Exam Vitals reviewed.  Constitutional:      General: She is not in acute distress.    Appearance: She is obese. She is not diaphoretic.  HENT:     Head: Normocephalic and atraumatic.     Nose: Nose normal.     Mouth/Throat:     Mouth: Mucous membranes are moist.  Eyes:     General: No scleral icterus.    Extraocular Movements: Extraocular movements intact.  Cardiovascular:     Rate and Rhythm: Normal rate and regular rhythm.     Heart sounds: Normal heart sounds. No murmur heard. Pulmonary:     Breath sounds: Normal breath sounds. No wheezing or rales.   Abdominal:     Palpations: Abdomen is soft.     Tenderness: There is no abdominal tenderness.  Musculoskeletal:     Cervical back: Neck supple. No tenderness.     Right lower leg: No edema.     Left lower leg: No edema.  Skin:    General: Skin is warm.     Findings: Rash (Lichenied skin around neck area) present.  Neurological:     General: No focal deficit present.     Mental Status: She is alert and oriented to person, place, and time.     Sensory: No sensory deficit.     Motor: No weakness.  Psychiatric:        Mood and Affect: Mood normal.        Behavior: Behavior normal.     BP 139/81 (BP Location: Right Arm, Patient Position: Sitting, Cuff Size: Large)   Pulse 87   Ht 5\' 2"  (1.575 m)   Wt 182 lb 6.4 oz (82.7 kg)   LMP 05/21/1997   SpO2 94%   BMI 33.36 kg/m  Wt Readings from Last 3 Encounters:  04/05/23 182 lb 6.4 oz (82.7 kg)  01/03/23 177 lb 9.6 oz (80.6 kg)  12/19/22 179 lb 6.4 oz (81.4 kg)    Lab Results  Component Value Date   TSH 2.790 04/30/2022   Lab Results  Component Value Date   WBC 4.9 12/19/2022   HGB 11.0 (L) 01/10/2023   HCT 35.2 (L) 01/10/2023   MCV 88.7 12/19/2022   PLT 226 12/19/2022   Lab Results  Component Value Date   NA 141 01/03/2023   K 4.0 01/03/2023   CO2 25 01/03/2023   GLUCOSE 118 (H) 01/03/2023   BUN 11 01/03/2023   CREATININE 0.91 01/03/2023   BILITOT 0.2 12/03/2022   ALKPHOS 90 12/03/2022   AST 16 12/03/2022   ALT 13 12/03/2022   PROT 6.8 12/03/2022  ALBUMIN 4.1 12/03/2022   CALCIUM 9.2 01/03/2023   ANIONGAP 9 12/31/2022   EGFR 74 01/03/2023   Lab Results  Component Value Date   CHOL 232 (H) 04/30/2022   Lab Results  Component Value Date   HDL 51 04/30/2022   Lab Results  Component Value Date   LDLCALC 154 (H) 04/30/2022   Lab Results  Component Value Date   TRIG 149 04/30/2022   Lab Results  Component Value Date   CHOLHDL 4.5 (H) 04/30/2022   Lab Results  Component Value Date   HGBA1C 6.0  (H) 12/19/2022      Assessment & Plan:   Problem List Items Addressed This Visit       Cardiovascular and Mediastinum   Essential hypertension (Chronic)    BP Readings from Last 1 Encounters:  04/05/23 139/81   Well-controlled with amlodipine 10 mg once daily and spironolactone 25 mg once daily Had to DC chlorthalidone 25 mg QD due to persistent hypokalemia Had started spironolactone for HTN, leg swelling and can also help with hypokalemia Counseled for compliance with the medications Advised DASH diet and moderate exercise/walking, at least 150 mins/week      Relevant Medications   spironolactone (ALDACTONE) 25 MG tablet   Other Relevant Orders   TSH   CMP14+EGFR   CBC with Differential/Platelet     Musculoskeletal and Integument   Glenohumeral arthritis, left    S/p shoulder arthroplasty        Other   Mixed hyperlipidemia (Chronic)    Lipid profile reviewed Needs to follow low cholesterol diet- DASH diet material provided Has tried Crestor and Lipitor - had myalgias Tolerates pravastatin 10 mg QD If she has leg cramps with it, she can be deemed statin intolerant Advised to take co-Q10 for leg cramps      Relevant Medications   spironolactone (ALDACTONE) 25 MG tablet   Other Relevant Orders   Lipid panel   Leg edema    Had to DC chlorthalidone due to hypokalemia Switched to spironolactone Leg elevation and compression socks as tolerated      Obesity (BMI 30.0-34.9)    BMI Readings from Last 3 Encounters:  04/05/23 33.36 kg/m  01/03/23 32.48 kg/m  12/19/22 32.81 kg/m   Has tried to follow low carb diet and regular exercise Unable to take oral stimulants/appetite suppressants as she takes Adderall for ADD Good candidate for GLP1 agonist Started Zepbound-initial BMI: 33.36, plan to increase dose as tolerated Encouraged to continue to follow low-carb diet and perform moderate exercise/walking at least 150 minutes/week      Relevant Medications    tirzepatide (ZEPBOUND) 2.5 MG/0.5ML injection vial   Vitamin D deficiency     Lab Results  Component Value Date   VD25OH 20.2 (L) 04/30/2022   On Vitamin D 50,000 IU qw      Relevant Orders   VITAMIN D 25 Hydroxy (Vit-D Deficiency, Fractures)   Prediabetes    Lab Results  Component Value Date   HGBA1C 6.0 (H) 12/19/2022   Advised to follow DASH diet for now Started Zepbound for weight loss      Relevant Orders   Hemoglobin A1c   Hypokalemia    Takes  Klor-Con 20 mEq QD Check CMP Advised to start magnesium 400 mg QD On spironolactone 25 mg QD for HTN      Relevant Orders   CMP14+EGFR   Encounter for general adult medical examination with abnormal findings - Primary    Physical exam as documented.  Fasting blood tests today. Denies flu vaccine.  Tdap vaccine today      Other Visit Diagnoses     Vitamin B 12 deficiency       Relevant Medications   cyanocobalamin (VITAMIN B12) injection 1,000 mcg (Completed)   Encounter for immunization       Relevant Orders   Tdap vaccine greater than or equal to 7yo IM (Completed)       Meds ordered this encounter  Medications   tirzepatide (ZEPBOUND) 2.5 MG/0.5ML injection vial    Sig: Inject 2.5 mg into the skin once a week.    Dispense:  2 mL    Refill:  0   cyanocobalamin (VITAMIN B12) injection 1,000 mcg   spironolactone (ALDACTONE) 25 MG tablet    Sig: Take 1 tablet (25 mg total) by mouth daily.    Dispense:  90 tablet    Refill:  1    Follow-up: Return in about 3 months (around 07/06/2023) for Weight management.    Anabel Halon, MD

## 2023-04-05 NOTE — Assessment & Plan Note (Signed)
  Lab Results  Component Value Date   VD25OH 20.2 (L) 04/30/2022   On Vitamin D 50,000 IU qw

## 2023-04-06 LAB — CBC WITH DIFFERENTIAL/PLATELET
Basophils Absolute: 0 10*3/uL (ref 0.0–0.2)
Basos: 0 %
EOS (ABSOLUTE): 0 10*3/uL (ref 0.0–0.4)
Eos: 0 %
Hematocrit: 37.7 % (ref 34.0–46.6)
Hemoglobin: 12.4 g/dL (ref 11.1–15.9)
Immature Grans (Abs): 0 10*3/uL (ref 0.0–0.1)
Immature Granulocytes: 0 %
Lymphocytes Absolute: 1.8 10*3/uL (ref 0.7–3.1)
Lymphs: 23 %
MCH: 29 pg (ref 26.6–33.0)
MCHC: 32.9 g/dL (ref 31.5–35.7)
MCV: 88 fL (ref 79–97)
Monocytes Absolute: 0.5 10*3/uL (ref 0.1–0.9)
Monocytes: 6 %
Neutrophils Absolute: 5.5 10*3/uL (ref 1.4–7.0)
Neutrophils: 71 %
Platelets: 274 10*3/uL (ref 150–450)
RBC: 4.27 x10E6/uL (ref 3.77–5.28)
RDW: 14.1 % (ref 11.7–15.4)
WBC: 7.9 10*3/uL (ref 3.4–10.8)

## 2023-04-06 LAB — LIPID PANEL
Chol/HDL Ratio: 2.9 ratio (ref 0.0–4.4)
Cholesterol, Total: 177 mg/dL (ref 100–199)
HDL: 61 mg/dL (ref >39)
LDL Chol Calc (NIH): 105 mg/dL — ABNORMAL HIGH (ref 0–99)
Triglycerides: 58 mg/dL (ref 0–149)
VLDL Cholesterol Cal: 11 mg/dL (ref 5–40)

## 2023-04-06 LAB — CMP14+EGFR
ALT: 10 [IU]/L (ref 0–32)
AST: 19 [IU]/L (ref 0–40)
Albumin: 4.6 g/dL (ref 3.8–4.9)
Alkaline Phosphatase: 112 [IU]/L (ref 44–121)
BUN/Creatinine Ratio: 14 (ref 9–23)
BUN: 13 mg/dL (ref 6–24)
Bilirubin Total: 0.2 mg/dL (ref 0.0–1.2)
CO2: 25 mmol/L (ref 20–29)
Calcium: 10 mg/dL (ref 8.7–10.2)
Chloride: 100 mmol/L (ref 96–106)
Creatinine, Ser: 0.94 mg/dL (ref 0.57–1.00)
Globulin, Total: 2.7 g/dL (ref 1.5–4.5)
Glucose: 108 mg/dL — ABNORMAL HIGH (ref 70–99)
Potassium: 4 mmol/L (ref 3.5–5.2)
Sodium: 139 mmol/L (ref 134–144)
Total Protein: 7.3 g/dL (ref 6.0–8.5)
eGFR: 71 mL/min/{1.73_m2} (ref >59)

## 2023-04-06 LAB — HEMOGLOBIN A1C
Est. average glucose Bld gHb Est-mCnc: 128 mg/dL
Hgb A1c MFr Bld: 6.1 % — ABNORMAL HIGH (ref 4.8–5.6)

## 2023-04-06 LAB — VITAMIN D 25 HYDROXY (VIT D DEFICIENCY, FRACTURES): Vit D, 25-Hydroxy: 56.4 ng/mL (ref 30.0–100.0)

## 2023-04-06 LAB — TSH: TSH: 0.893 u[IU]/mL (ref 0.450–4.500)

## 2023-04-07 ENCOUNTER — Other Ambulatory Visit: Payer: Self-pay | Admitting: Internal Medicine

## 2023-04-07 ENCOUNTER — Other Ambulatory Visit: Payer: Self-pay | Admitting: Orthopedic Surgery

## 2023-04-07 DIAGNOSIS — E782 Mixed hyperlipidemia: Secondary | ICD-10-CM

## 2023-04-08 ENCOUNTER — Other Ambulatory Visit: Payer: Self-pay | Admitting: Internal Medicine

## 2023-04-08 ENCOUNTER — Encounter: Payer: Self-pay | Admitting: Internal Medicine

## 2023-04-08 DIAGNOSIS — E876 Hypokalemia: Secondary | ICD-10-CM

## 2023-04-08 MED ORDER — POTASSIUM CHLORIDE CRYS ER 20 MEQ PO TBCR: 20.0000 meq | EXTENDED_RELEASE_TABLET | Freq: Every day | ORAL | 3 refills | Status: DC

## 2023-04-09 ENCOUNTER — Ambulatory Visit (HOSPITAL_COMMUNITY): Payer: 59 | Admitting: Occupational Therapy

## 2023-04-09 ENCOUNTER — Encounter (HOSPITAL_COMMUNITY): Payer: Self-pay | Admitting: Occupational Therapy

## 2023-04-09 DIAGNOSIS — M25612 Stiffness of left shoulder, not elsewhere classified: Secondary | ICD-10-CM | POA: Diagnosis not present

## 2023-04-09 DIAGNOSIS — M25512 Pain in left shoulder: Secondary | ICD-10-CM

## 2023-04-09 DIAGNOSIS — R29898 Other symptoms and signs involving the musculoskeletal system: Secondary | ICD-10-CM

## 2023-04-09 NOTE — Patient Instructions (Signed)
  1) Flexion Wall Stretch    Face wall, place affected handon wall in front of you. Slide hand up the wall  and lean body in towards the wall. Hold for 10 seconds. Repeat 3-5 times. 1-2 times/day.     2) Towel Stretch with Internal Rotation      Gently pull up (or to the side) your affected arm  behind your back with the assist of a towel. Hold 10 seconds, repeat 3-5 times. 1-2 times/day.             3) Corner Stretch    Stand at a corner of a wall, place your arms on the walls with elbows bent. Lean into the corner until a stretch is felt along the front of your chest and/or shoulders. Hold for 10 seconds. Repeat 3-5X, 1-2 times/day.    4) Posterior Capsule Stretch    Bring the involved arm across chest. Grasp elbow and pull toward chest until you feel a stretch in the back of the upper arm and shoulder. Hold 10 seconds. Repeat 3-5X. Complete 1-2 times/day.
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
## 2023-04-10 ENCOUNTER — Other Ambulatory Visit: Payer: Self-pay

## 2023-04-10 ENCOUNTER — Other Ambulatory Visit: Payer: Self-pay | Admitting: Internal Medicine

## 2023-04-10 DIAGNOSIS — E559 Vitamin D deficiency, unspecified: Secondary | ICD-10-CM

## 2023-04-10 DIAGNOSIS — I10 Essential (primary) hypertension: Secondary | ICD-10-CM

## 2023-04-10 MED ORDER — VITAMIN D (ERGOCALCIFEROL) 1.25 MG (50000 UNIT) PO CAPS: 50000.0000 [IU] | ORAL_CAPSULE | ORAL | 1 refills | Status: DC

## 2023-04-10 MED ORDER — AMLODIPINE BESYLATE 10 MG PO TABS: 10.0000 mg | ORAL_TABLET | Freq: Every day | ORAL | 1 refills | Status: DC

## 2023-04-11 ENCOUNTER — Encounter (HOSPITAL_COMMUNITY): Payer: BC Managed Care – PPO | Admitting: Occupational Therapy

## 2023-04-16 ENCOUNTER — Ambulatory Visit (HOSPITAL_COMMUNITY): Payer: 59 | Admitting: Occupational Therapy

## 2023-04-16 ENCOUNTER — Encounter (HOSPITAL_COMMUNITY): Payer: Self-pay | Admitting: Occupational Therapy

## 2023-04-16 DIAGNOSIS — M25612 Stiffness of left shoulder, not elsewhere classified: Secondary | ICD-10-CM

## 2023-04-16 DIAGNOSIS — R29898 Other symptoms and signs involving the musculoskeletal system: Secondary | ICD-10-CM | POA: Diagnosis not present

## 2023-04-16 DIAGNOSIS — M25512 Pain in left shoulder: Secondary | ICD-10-CM

## 2023-04-16 NOTE — Patient Instructions (Addendum)
  1) Flexion Wall Stretch    Face wall, place affected handon wall in front of you. Slide hand up the wall  and lean body in towards the wall. Hold for 10 seconds. Repeat 3-5 times. 1-2 times/day.     2) Towel Stretch with Internal Rotation     Gently pull up (or to the side) your affected arm  behind your back with the assist of a towel. Hold 10 seconds, repeat 3-5 times. 1-2 times/day.             3) Corner Stretch    Stand at a corner of a wall, place your arms on the walls with elbows bent. Lean into the corner until a stretch is felt along the front of your chest and/or shoulders. Hold for 10 seconds. Repeat 3-5X, 1-2 times/day.    4) Posterior Capsule Stretch    Bring the involved arm across chest. Grasp elbow and pull toward chest until you feel a stretch in the back of the upper arm and shoulder. Hold 10 seconds. Repeat 3-5X. Complete 1-2 times/day.
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
## 2023-04-20 DIAGNOSIS — G4733 Obstructive sleep apnea (adult) (pediatric): Secondary | ICD-10-CM | POA: Diagnosis not present

## 2023-05-03 DIAGNOSIS — F4310 Post-traumatic stress disorder, unspecified: Secondary | ICD-10-CM | POA: Diagnosis not present

## 2023-05-03 DIAGNOSIS — F900 Attention-deficit hyperactivity disorder, predominantly inattentive type: Secondary | ICD-10-CM | POA: Diagnosis not present

## 2023-05-10 DIAGNOSIS — Z1231 Encounter for screening mammogram for malignant neoplasm of breast: Secondary | ICD-10-CM | POA: Diagnosis not present

## 2023-05-10 DIAGNOSIS — Z01419 Encounter for gynecological examination (general) (routine) without abnormal findings: Secondary | ICD-10-CM | POA: Diagnosis not present

## 2023-05-10 LAB — HM MAMMOGRAPHY

## 2023-06-03 ENCOUNTER — Encounter: Payer: Self-pay | Admitting: Internal Medicine

## 2023-06-03 ENCOUNTER — Other Ambulatory Visit: Payer: Self-pay | Admitting: Internal Medicine

## 2023-06-03 DIAGNOSIS — M19011 Primary osteoarthritis, right shoulder: Secondary | ICD-10-CM

## 2023-06-03 DIAGNOSIS — M51362 Other intervertebral disc degeneration, lumbar region with discogenic back pain and lower extremity pain: Secondary | ICD-10-CM

## 2023-06-03 MED ORDER — IBUPROFEN 800 MG PO TABS: 800.0000 mg | ORAL_TABLET | Freq: Three times a day (TID) | ORAL | 0 refills | Status: DC | PRN

## 2023-06-03 MED ORDER — CYCLOBENZAPRINE HCL 10 MG PO TABS: 10.0000 mg | ORAL_TABLET | Freq: Two times a day (BID) | ORAL | 0 refills | Status: DC | PRN

## 2023-06-03 MED ORDER — LIDOCAINE 5 % EX PTCH: 1.0000 | MEDICATED_PATCH | CUTANEOUS | 0 refills | Status: AC

## 2023-06-05 ENCOUNTER — Other Ambulatory Visit: Payer: Self-pay | Admitting: Internal Medicine

## 2023-06-05 DIAGNOSIS — K219 Gastro-esophageal reflux disease without esophagitis: Secondary | ICD-10-CM

## 2023-06-12 ENCOUNTER — Ambulatory Visit: Payer: BC Managed Care – PPO | Admitting: Internal Medicine

## 2023-06-12 ENCOUNTER — Encounter: Payer: Self-pay | Admitting: Internal Medicine

## 2023-06-12 VITALS — BP 117/78 | HR 102 | Resp 98 | Ht 62.0 in | Wt 175.0 lb

## 2023-06-12 DIAGNOSIS — R7303 Prediabetes: Secondary | ICD-10-CM

## 2023-06-12 DIAGNOSIS — I10 Essential (primary) hypertension: Secondary | ICD-10-CM | POA: Diagnosis not present

## 2023-06-12 DIAGNOSIS — E876 Hypokalemia: Secondary | ICD-10-CM

## 2023-06-12 DIAGNOSIS — M51362 Other intervertebral disc degeneration, lumbar region with discogenic back pain and lower extremity pain: Secondary | ICD-10-CM | POA: Diagnosis not present

## 2023-06-12 DIAGNOSIS — E782 Mixed hyperlipidemia: Secondary | ICD-10-CM

## 2023-06-12 DIAGNOSIS — E66811 Obesity, class 1: Secondary | ICD-10-CM

## 2023-06-12 MED ORDER — DICLOFENAC SODIUM 75 MG PO TBEC: 75.0000 mg | DELAYED_RELEASE_TABLET | Freq: Two times a day (BID) | ORAL | 0 refills | Status: DC | PRN

## 2023-06-12 MED ORDER — TRAMADOL HCL 50 MG PO TABS: 50.0000 mg | ORAL_TABLET | Freq: Three times a day (TID) | ORAL | 0 refills | Status: AC | PRN

## 2023-06-12 NOTE — Assessment & Plan Note (Addendum)
 BMI Readings from Last 3 Encounters:  06/12/23 32.01 kg/m  04/05/23 33.36 kg/m  01/03/23 32.48 kg/m   Has tried to follow low carb diet and regular exercise for about 1 year, was able to lose 7 lbs with lifestyle modification Unable to take oral stimulants/appetite suppressants as she takes Adderall for ADD Good candidate for GLP1 agonist - started Zepbound, plan to increase dose as tolerated Encouraged to continue to follow low-carb diet and perform moderate exercise/walking at least 150 minutes/week

## 2023-06-12 NOTE — Patient Instructions (Signed)
Please start taking Diclofenac instead of Ibuprofen for mild-moderate pain.  Please take Tramadol for severe pain only.  Use back brace and/or apply heating pad for back pain.  Please avoid heavy lifting or frequent bending.  Please continue to take other medications as prescribed.  Please continue to follow low carb diet and perform moderate exercise/walking at least 150 mins/week.  Please get fasting blood tests done before the next visit.

## 2023-06-12 NOTE — Assessment & Plan Note (Signed)
BP Readings from Last 1 Encounters:  06/12/23 117/78   Well-controlled with amlodipine 10 mg once daily and spironolactone 25 mg once daily Had to DC chlorthalidone 25 mg QD due to persistent hypokalemia Had started spironolactone for HTN, leg swelling and can also help with hypokalemia Counseled for compliance with the medications Advised DASH diet and moderate exercise/walking, at least 150 mins/week

## 2023-06-12 NOTE — Assessment & Plan Note (Signed)
Lab Results  Component Value Date   HGBA1C 6.1 (H) 04/05/2023   Advised to follow DASH diet for now

## 2023-06-12 NOTE — Assessment & Plan Note (Signed)
 Takes  Klor-Con 20 mEq QD Check CMP Advised to start magnesium 400 mg QD On spironolactone 25 mg QD for HTN

## 2023-06-12 NOTE — Progress Notes (Addendum)
 Established Patient Office Visit  Subjective:  Patient ID: [name] [name], female    DOB: 05-23-1965  Age: 58 y.o. MRN: [id number]  CC:  Chief Complaint  Patient presents with   Back Pain    Pt reports back pain flare up. Pain level is a 6 out of 10    HPI [name] [name] is a 58 y.o. female with past medical history of HTN,  GERD/gastritis, ADD with anxiety and obesity who presents for f/u of her chronic medical conditions.  Acute low back pain: She reports right-sided low back pain since 06/03/23.  Pain is constant, dull, radiating towards right buttock and LE.  She has tried ibuprofen, Flexeril and lidocaine patch with mild relief.  HTN: BP is well-controlled. Takes amlodipine 10 mg QD and spironolactone 25 mg QD regularly. Patient denies headache, dizziness, chest pain, dyspnea or palpitations.  Hypokalemia: She takes potassium supplement-20 mEq once daily currently and spironolactone 25 mg QD.  GAD and ADD: She has history of GAD and ADD, followed by psychiatry. She is currently taking Xanax as needed for anxiety. She is also on Adderall for ADD. Her BP is WNL. She reports snoring at nighttime. Had sleep study, showed mild OSA.  She reports diffuse myalgias, and attributes it to her weight gain.  She has been trying to follow low-carb diet. She is motivated to follow low carb diet and increase physical activity as tolerated.  She has lost about 7 lbs since the last visit with diet modification and moderate exercise.  She was not able to get Zepbound due to insurance coverage concern.  Past Medical History:  Diagnosis Date   Allergy    Phreesia 09/19/2020   Anxiety    Bradycardia 03/02/2015   Depression    GERD (gastroesophageal reflux disease)    History of hysterectomy    History of oophorectomy    Hx of cholecystectomy    Hypertension    IDA (iron deficiency anemia)    Migraines    Near syncope 03/02/2015   Sciatica neuralgia, right    Stroke (HCC)    "light stroke  08/2015. No deficits   Tobacco abuse 03/03/2015   Tobacco abuse 03/03/2015   Vertigo     Past Surgical History:  Procedure Laterality Date   ABDOMINAL HYSTERECTOMY     BIOPSY  12/04/2016   Procedure: BIOPSY;  Surgeon: West Bali, MD;  Location: [facility];  Service: Endoscopy;;  gastric and duodenal biopsy   BIOPSY  07/30/2017   Procedure: BIOPSY;  Surgeon: West Bali, MD;  Location: [facility];  Service: Endoscopy;;  random colon bx's   CATARACT EXTRACTION W/PHACO Left 05/19/2021   Procedure: CATARACT EXTRACTION PHACO AND INTRAOCULAR LENS PLACEMENT (IOC);  Surgeon: Fabio Pierce, MD;  Location: [facility];  Service: Ophthalmology;  Laterality: Left;  CDE: 3.35   CATARACT EXTRACTION W/PHACO Right 06/09/2021   Procedure: CATARACT EXTRACTION PHACO AND INTRAOCULAR LENS PLACEMENT (IOC);  Surgeon: Fabio Pierce, MD;  Location: [facility];  Service: Ophthalmology;  Laterality: Right;  CDE: 5.99   CESAREAN SECTION     x2   CESAREAN SECTION N/A    Phreesia 09/19/2020   CHOLECYSTECTOMY     COLONOSCOPY WITH ESOPHAGOGASTRODUODENOSCOPY (EGD)  2007   Dr. Juanda Chance: normal   COLONOSCOPY WITH PROPOFOL N/A 07/30/2017   Dr. Darrick Penna: Redundant colon, external/internal hemorrhoids. Colon otherwise normal.  Random colon biopsies negative.   ESOPHAGOGASTRODUODENOSCOPY (EGD) WITH PROPOFOL N/A 12/04/2016   Dr. Darrick Penna: Gastritis but no H.pylori, small  bowel bx negative. gastric polyps benign fundic gland   HEMORRHOID SURGERY     TOTAL SHOULDER ARTHROPLASTY Left 01/10/2023   Procedure: TOTAL SHOULDER ARTHROPLASTY;  Surgeon: Oliver Barre, MD;  Location: [facility];  Service: Orthopedics;  Laterality: Left;    Family History  Problem Relation Age of Onset   ALS Mother    Cardiomyopathy Father    Colon cancer Neg Hx     Social History   Socioeconomic History   Marital status: Married    Spouse name: Not on file   Number of children: Not on file   Years of education: Not on file   Highest education  level: Not on file  Occupational History   Not on file  Tobacco Use   Smoking status: Former    Current packs/day: 0.00    Average packs/day: 0.1 packs/day for 20.0 years (2.0 ttl pk-yrs)    Types: Cigarettes    Start date: 02/23/2001    Quit date: 02/23/2021    Years since quitting: 2.3   Smokeless tobacco: Never  Vaping Use   Vaping status: Never Used  Substance and Sexual Activity   Alcohol use: Not Currently    Comment: seldom   Drug use: Yes    Frequency: 2.0 times per week    Types: Marijuana    Comment: occ   Sexual activity: Yes    Birth control/protection: Surgical  Other Topics Concern   Not on file  Social History Narrative   Drinks 5-6 cups of coffee daily.   Social Drivers of Corporate investment banker Strain: Not on file  Food Insecurity: Not on file  Transportation Needs: Not on file  Physical Activity: Not on file  Stress: Not on file  Social Connections: Not on file  Intimate Partner Violence: Not on file    Outpatient Medications Prior to Visit  Medication Sig Dispense Refill   ADDERALL XR 30 MG 24 hr capsule Take 30 mg by mouth daily.     albuterol (VENTOLIN HFA) 108 (90 Base) MCG/ACT inhaler TAKE 2 PUFFS BY MOUTH EVERY 6 HOURS AS NEEDED FOR WHEEZE OR SHORTNESS OF BREATH 8 g 5   ALPRAZolam (XANAX) 1 MG tablet Take 1 mg by mouth 4 (four) times daily as needed for anxiety.     amLODipine (NORVASC) 10 MG tablet Take 1 tablet (10 mg total) by mouth daily. 90 tablet 1   amphetamine-dextroamphetamine (ADDERALL) 30 MG tablet Take 30 mg by mouth daily.     cetirizine (ZYRTEC) 10 MG tablet TAKE 1 TABLET BY MOUTH EVERY DAY 90 tablet 3   Coenzyme Q10 (CO Q-10 PO) Take 1 capsule by mouth daily.     estradiol (ESTRACE) 0.5 MG tablet Take 0.5 mg by mouth daily.     lidocaine (LIDODERM) 5 % Place 1 patch onto the skin daily. Remove & Discard patch within 12 hours or as directed by MD 30 patch 0   Misc. Devices MISC Blood pressure cuff/device - 1. ICD10: I10 1 each  0   pantoprazole (PROTONIX) 40 MG tablet TAKE (1) TABLET BY MOUTH ONCE DAILY. 90 tablet 0   potassium chloride SA (KLOR-CON M) 20 MEQ tablet Take 1 tablet (20 mEq total) by mouth daily. 30 tablet 3   spironolactone (ALDACTONE) 25 MG tablet Take 1 tablet (25 mg total) by mouth daily. 90 tablet 1   Vitamin D, Ergocalciferol, (DRISDOL) 1.25 MG (50000 UNIT) CAPS capsule Take 1 capsule (50,000 Units total) by mouth every 7 (seven) days.  12 capsule 1   cyclobenzaprine (FLEXERIL) 10 MG tablet Take 1 tablet (10 mg total) by mouth 2 (two) times daily as needed for muscle spasms. 30 tablet 0   ibuprofen (ADVIL) 800 MG tablet Take 1 tablet (800 mg total) by mouth every 8 (eight) hours as needed. 30 tablet 0   pravastatin (PRAVACHOL) 10 MG tablet TAKE (1) TABLET BY MOUTH ONCE DAILY. 30 tablet 1   tirzepatide (ZEPBOUND) 2.5 MG/0.5ML injection vial Inject 2.5 mg into the skin once a week. 2 mL 0   No facility-administered medications prior to visit.    Allergies  Allergen Reactions   Sulfa Antibiotics Hives and Nausea And Vomiting    ROS Review of Systems  Constitutional:  Negative for chills and fever.  HENT:  Negative for congestion, sinus pressure, sinus pain and sore throat.   Eyes:  Negative for pain and discharge.  Respiratory:  Negative for cough and shortness of breath.   Cardiovascular:  Negative for chest pain and palpitations.  Gastrointestinal:  Negative for abdominal pain, diarrhea, nausea and vomiting.  Endocrine: Negative for polydipsia and polyuria.  Genitourinary:  Negative for dysuria and hematuria.  Musculoskeletal:  Positive for arthralgias and back pain. Negative for neck pain and neck stiffness.  Skin:  Negative for rash.  Neurological:  Negative for dizziness and weakness.  Psychiatric/Behavioral:  Negative for agitation and behavioral problems. The patient is nervous/anxious.       Objective:    Physical Exam Vitals reviewed.  Constitutional:      General: She is not  in acute distress.    Appearance: She is obese. She is not diaphoretic.  HENT:     Head: Normocephalic and atraumatic.     Nose: Nose normal.     Mouth/Throat:     Mouth: Mucous membranes are moist.  Eyes:     General: No scleral icterus.    Extraocular Movements: Extraocular movements intact.  Cardiovascular:     Rate and Rhythm: Normal rate and regular rhythm.     Heart sounds: Normal heart sounds. No murmur heard. Pulmonary:     Breath sounds: Normal breath sounds. No wheezing or rales.  Abdominal:     Palpations: Abdomen is soft.     Tenderness: There is no abdominal tenderness.  Musculoskeletal:     Cervical back: Neck supple. No tenderness.     Lumbar back: Tenderness (Right paraspinal) present. Positive right straight leg raise test. Negative left straight leg raise test.     Right lower leg: No edema.     Left lower leg: No edema.  Skin:    General: Skin is warm.     Findings: Rash (Lichenied skin around neck area) present.  Neurological:     General: No focal deficit present.     Mental Status: She is alert and oriented to person, place, and time.     Sensory: No sensory deficit.     Motor: No weakness.  Psychiatric:        Mood and Affect: Mood normal.        Behavior: Behavior normal.     BP 117/78   Pulse (!) 102   Resp (!) 98   Ht 5\' 2"  (1.575 m)   Wt 175 lb (79.4 kg)   LMP 05/21/1997   BMI 32.01 kg/m  Wt Readings from Last 3 Encounters:  06/12/23 175 lb (79.4 kg)  04/05/23 182 lb 6.4 oz (82.7 kg)  01/03/23 177 lb 9.6 oz (80.6 kg)  Lab Results  Component Value Date   TSH 0.893 04/05/2023   Lab Results  Component Value Date   WBC 7.9 04/05/2023   HGB 12.4 04/05/2023   HCT 37.7 04/05/2023   MCV 88 04/05/2023   PLT 274 04/05/2023   Lab Results  Component Value Date   NA 139 04/05/2023   K 4.0 04/05/2023   CO2 25 04/05/2023   GLUCOSE 108 (H) 04/05/2023   BUN 13 04/05/2023   CREATININE 0.94 04/05/2023   BILITOT 0.2 04/05/2023   ALKPHOS  112 04/05/2023   AST 19 04/05/2023   ALT 10 04/05/2023   PROT 7.3 04/05/2023   ALBUMIN 4.6 04/05/2023   CALCIUM 10.0 04/05/2023   ANIONGAP 9 12/31/2022   EGFR 71 04/05/2023   Lab Results  Component Value Date   CHOL 177 04/05/2023   Lab Results  Component Value Date   HDL 61 04/05/2023   Lab Results  Component Value Date   LDLCALC 105 (H) 04/05/2023   Lab Results  Component Value Date   TRIG 58 04/05/2023   Lab Results  Component Value Date   CHOLHDL 2.9 04/05/2023   Lab Results  Component Value Date   HGBA1C 6.1 (H) 04/05/2023      Assessment & Plan:   Problem List Items Addressed This Visit       Cardiovascular and Mediastinum   Essential hypertension (Chronic)   BP Readings from Last 1 Encounters:  06/12/23 117/78   Well-controlled with amlodipine 10 mg once daily and spironolactone 25 mg once daily Had to DC chlorthalidone 25 mg QD due to persistent hypokalemia Had started spironolactone for HTN, leg swelling and can also help with hypokalemia Counseled for compliance with the medications Advised DASH diet and moderate exercise/walking, at least 150 mins/week      Relevant Orders   CMP14+EGFR     Musculoskeletal and Integument   DDD (degenerative disc disease), lumbar - Primary   X-ray of lumbar spine reviewed, showed mild degenerative changes Although her pain is out of proportion to her degenerative changes Diclofenac as needed for pain Prefer to avoid steroids due to weight gain concern Tramadol as needed for severe pain for short-term Avoid heavy lifting and frequent bending, work note provided Simple back exercises advised        Other   Mixed hyperlipidemia (Chronic)   Check lipid profile Needs to follow low cholesterol diet- DASH diet material provided Has tried Crestor and Lipitor - had myalgias Tolerates pravastatin 10 mg QD If she has leg cramps with it, she can be deemed statin intolerant Advised to take co-Q10 for leg cramps       Relevant Orders   Lipid Profile   Obesity (BMI 30.0-34.9)   BMI Readings from Last 3 Encounters:  06/12/23 32.01 kg/m  04/05/23 33.36 kg/m  01/03/23 32.48 kg/m   Has tried to follow low carb diet and regular exercise for about 1 year, was able to lose 7 lbs with lifestyle modification Unable to take oral stimulants/appetite suppressants as she takes Adderall for ADD Good candidate for GLP1 agonist - started Zepbound, plan to increase dose as tolerated Encouraged to continue to follow low-carb diet and perform moderate exercise/walking at least 150 minutes/week      Prediabetes   Lab Results  Component Value Date   HGBA1C 6.1 (H) 04/05/2023   Advised to follow DASH diet for now      Relevant Orders   CMP14+EGFR   Hemoglobin A1c   Hypokalemia  Takes  Klor-Con 20 mEq QD Check CMP Advised to start magnesium 400 mg QD On spironolactone 25 mg QD for HTN       Meds ordered this encounter  Medications   DISCONTD: diclofenac (VOLTAREN) 75 MG EC tablet    Sig: Take 1 tablet (75 mg total) by mouth 2 (two) times daily as needed for mild pain (pain score 1-3) or moderate pain (pain score 4-6).    Dispense:  30 tablet    Refill:  0   traMADol (ULTRAM) 50 MG tablet    Sig: Take 1 tablet (50 mg total) by mouth every 8 (eight) hours as needed for up to 5 days.    Dispense:  15 tablet    Refill:  0    Follow-up: Return in about 4 months (around 10/10/2023) for HTN and GERD.    Anabel Halon, MD

## 2023-06-12 NOTE — Assessment & Plan Note (Addendum)
Check lipid profile Needs to follow low cholesterol diet- DASH diet material provided Has tried Crestor and Lipitor - had myalgias Tolerates pravastatin 10 mg QD If she has leg cramps with it, she can be deemed statin intolerant Advised to take co-Q10 for leg cramps

## 2023-06-12 NOTE — Assessment & Plan Note (Addendum)
X-ray of lumbar spine reviewed, showed mild degenerative changes Although her pain is out of proportion to her degenerative changes Diclofenac as needed for pain Prefer to avoid steroids due to weight gain concern Tramadol as needed for severe pain for short-term Avoid heavy lifting and frequent bending, work note provided Simple back exercises advised

## 2023-07-03 ENCOUNTER — Ambulatory Visit: Payer: 59 | Admitting: Orthopedic Surgery

## 2023-07-08 ENCOUNTER — Ambulatory Visit: Payer: 59 | Admitting: Internal Medicine

## 2023-07-16 ENCOUNTER — Other Ambulatory Visit: Payer: Self-pay | Admitting: Internal Medicine

## 2023-07-16 ENCOUNTER — Encounter: Payer: Self-pay | Admitting: Internal Medicine

## 2023-07-16 DIAGNOSIS — M19011 Primary osteoarthritis, right shoulder: Secondary | ICD-10-CM

## 2023-07-16 DIAGNOSIS — M51362 Other intervertebral disc degeneration, lumbar region with discogenic back pain and lower extremity pain: Secondary | ICD-10-CM

## 2023-07-17 ENCOUNTER — Ambulatory Visit: Payer: BC Managed Care – PPO | Admitting: Orthopedic Surgery

## 2023-07-17 ENCOUNTER — Other Ambulatory Visit: Payer: Self-pay

## 2023-07-17 ENCOUNTER — Other Ambulatory Visit (INDEPENDENT_AMBULATORY_CARE_PROVIDER_SITE_OTHER): Payer: Self-pay

## 2023-07-17 DIAGNOSIS — R202 Paresthesia of skin: Secondary | ICD-10-CM

## 2023-07-17 DIAGNOSIS — Z96612 Presence of left artificial shoulder joint: Secondary | ICD-10-CM | POA: Diagnosis not present

## 2023-07-17 DIAGNOSIS — E782 Mixed hyperlipidemia: Secondary | ICD-10-CM

## 2023-07-17 MED ORDER — PRAVASTATIN SODIUM 10 MG PO TABS
10.0000 mg | ORAL_TABLET | Freq: Every day | ORAL | 1 refills | Status: DC
Start: 2023-07-17 — End: 2023-08-29

## 2023-07-17 MED ORDER — CYCLOBENZAPRINE HCL 10 MG PO TABS: 10.0000 mg | ORAL_TABLET | Freq: Two times a day (BID) | ORAL | 0 refills | Status: DC | PRN

## 2023-07-17 MED ORDER — DICLOFENAC SODIUM 75 MG PO TBEC: 75.0000 mg | DELAYED_RELEASE_TABLET | Freq: Two times a day (BID) | ORAL | 0 refills | Status: DC | PRN

## 2023-07-17 NOTE — Telephone Encounter (Signed)
 Refill sent to pharmacy.

## 2023-07-18 ENCOUNTER — Encounter: Payer: Self-pay | Admitting: Orthopedic Surgery

## 2023-07-18 MED ORDER — TIRZEPATIDE-WEIGHT MANAGEMENT 2.5 MG/0.5ML ~~LOC~~ SOLN
2.5000 mg | SUBCUTANEOUS | 0 refills | Status: DC
Start: 2023-07-18 — End: 2023-10-10

## 2023-07-18 NOTE — Addendum Note (Signed)
 Addended byTrena Platt on: 07/18/2023 04:55 PM   Modules accepted: Orders

## 2023-07-18 NOTE — Progress Notes (Signed)
 Orthopaedic Postop Note  Assessment: [name] [name] is a 58 y.o. female s/p Left Anatomic Total Shoulder Arthroplasty  DOS: 01/10/2023  Paresthesias of the right hand  Plan: [name] [name] is doing well following left total shoulder arthroplasty.  She has occasional pains, but these are to be expected.  Overall, her function and pain is much better.  She does continue to have numbness and tingling in the right hand.  Signs and symptoms consistent with presentation for carpal tunnel syndrome.  We have recommended an EMG, and we will see her back in clinic once the results are available.    Follow-up: Return for After EMG.  XR at next visit: Left shoulder  Subjective:  Chief Complaint  Patient presents with   Routine Post Op    L TSA DOS: 01/10/2023    History of Present Illness: [name] [name] is a 58 y.o. female who presents following the above stated procedure.  Surgery was approximately 6 months ago.  She is doing well.  She had some aches and pains in the left shoulder a couple of days ago, but this improved after just a short period of time.  No specific problem.  She is happy with the range of motion, as well as the improvements in her pain.  She is also complaining of numbness and tingling in the right hand, which gets worse overnight.    Review of Systems: No fevers or chills No numbness or tingling No Chest Pain No shortness of breath   Objective: LMP 05/21/1997   Physical Exam:  Alert and oriented.  No acute distress.  Surgical incision is healed.  Slightly hypertrophic scar.  Sensation intact in the axillary nerve distribution.  Sensation intact throughout the right hand.  She tolerates passive forward flexion to 160, abduction to 110.    Right hand without deformity.  Positive Tinel's.  Positive Phalen's.  IMAGING: I personally ordered and reviewed the following images:  X-ray of the left shoulder was obtained in the clinic today.  These are compared to  available x-rays.  No change in overall alignment.  No subsidence of the prosthesis.  Glenohumeral joint is reduced.  [name] [name] for the glenoid remains in unchanged position.  No lucency around the hardware.  Impression: Left total shoulder arthroplasty in unchanged position   Oliver Barre, MD 07/18/2023 9:13 AM

## 2023-07-23 ENCOUNTER — Ambulatory Visit (INDEPENDENT_AMBULATORY_CARE_PROVIDER_SITE_OTHER): Admitting: Physical Medicine and Rehabilitation

## 2023-07-23 DIAGNOSIS — M79641 Pain in right hand: Secondary | ICD-10-CM | POA: Diagnosis not present

## 2023-07-23 DIAGNOSIS — M25531 Pain in right wrist: Secondary | ICD-10-CM

## 2023-07-23 DIAGNOSIS — M19011 Primary osteoarthritis, right shoulder: Secondary | ICD-10-CM

## 2023-07-23 DIAGNOSIS — Z96612 Presence of left artificial shoulder joint: Secondary | ICD-10-CM | POA: Diagnosis not present

## 2023-07-23 DIAGNOSIS — R202 Paresthesia of skin: Secondary | ICD-10-CM

## 2023-07-23 NOTE — Progress Notes (Unsigned)
 Pain Score--0 Patient Right Hand Dominate
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
## 2023-07-24 ENCOUNTER — Encounter: Payer: Self-pay | Admitting: Physical Medicine and Rehabilitation

## 2023-07-24 NOTE — Procedures (Signed)
 EMG & NCV Findings: Evaluation of the right median motor nerve showed prolonged distal onset latency (4.9 ms) and decreased conduction velocity (Elbow-Wrist, 43 m/s).  The right median (across palm) sensory nerve showed prolonged distal peak latency (Wrist, 4.9 ms) and prolonged distal peak latency (Palm, 12.0 ms).  All remaining nerves (as indicated in the following tables) were within normal limits.    All examined muscles (as indicated in the following table) showed no evidence of electrical instability.    Impression: The above electrodiagnostic study is ABNORMAL and reveals evidence of a moderate right median nerve entrapment at the wrist (carpal tunnel syndrome) affecting sensory and motor components.   There is no significant electrodiagnostic evidence of any other focal nerve entrapment, brachial plexopathy or cervical radiculopathy.   Recommendations: 1.  Follow-up with referring physician. 2.  Continue current management of symptoms. 3.  Continue use of resting splint at night-time and as needed during the day. 4.  Suggest surgical evaluation.  ___________________________ Naaman Plummer FAAPMR Board Certified, American Board of Physical Medicine and Rehabilitation    Nerve Conduction Studies Anti Sensory Summary Table   Stim Site NR Peak (ms) Norm Peak (ms) P-T Amp (V) Norm P-T Amp Site1 Site2 Delta-P (ms) Dist (cm) Vel (m/s) Norm Vel (m/s)  Right Median Acr Palm Anti Sensory (2nd Digit)  30.4C  Wrist    *4.9 <3.6 31.0 >10 Wrist Palm 7.1 0.0    Palm    *12.0 <2.0 10.4         Right Radial Anti Sensory (Base 1st Digit)  30.2C  Wrist    2.0 <3.1 31.1  Wrist Base 1st Digit 2.0 0.0    Right Ulnar Anti Sensory (5th Digit)  30.7C  Wrist    3.3 <3.7 28.9 >15.0 Wrist 5th Digit 3.3 14.0 42 >38   Motor Summary Table   Stim Site NR Onset (ms) Norm Onset (ms) O-P Amp (mV) Norm O-P Amp Site1 Site2 Delta-0 (ms) Dist (cm) Vel (m/s) Norm Vel (m/s)  Right Median Motor (Abd Poll Brev)   30.4C    Martin-Gruber  Wrist    *4.9 <4.2 5.4 >5 Elbow Wrist 4.3 18.5 *43 >50  Elbow    9.2  5.8         Right Ulnar Motor (Abd Dig Min)  30.6C  Wrist    2.7 <4.2 10.5 >3 B Elbow Wrist 2.9 18.0 62 >53  B Elbow    5.6  10.2  A Elbow B Elbow 1.0 10.0 100 >53  A Elbow    6.6  10.2          EMG   Side Muscle Nerve Root Ins Act Fibs Psw Amp Dur Poly Recrt Int Dennie Bible Comment  Right Abd Poll Brev Median C8-T1 Nml Nml Nml Nml Nml 0 Nml Nml   Right 1stDorInt Ulnar C8-T1 Nml Nml Nml Nml Nml 0 Nml Nml   Right PronatorTeres Median C6-7 Nml Nml Nml Nml Nml 0 Nml Nml   Right Biceps Musculocut C5-6 Nml Nml Nml Nml Nml 0 Nml Nml   Right Deltoid Axillary C5-6 Nml Nml Nml Nml Nml 0 Nml Nml     Nerve Conduction Studies Anti Sensory Left/Right Comparison   Stim Site L Lat (ms) R Lat (ms) L-R Lat (ms) L Amp (V) R Amp (V) L-R Amp (%) Site1 Site2 L Vel (m/s) R Vel (m/s) L-R Vel (m/s)  Median Acr Palm Anti Sensory (2nd Digit)  30.4C  Wrist  *4.9   31.0  Wrist  Palm     Palm  *12.0   10.4        Radial Anti Sensory (Base 1st Digit)  30.2C  Wrist  2.0   31.1  Wrist Base 1st Digit     Ulnar Anti Sensory (5th Digit)  30.7C  Wrist  3.3   28.9  Wrist 5th Digit  42    Motor Left/Right Comparison   Stim Site L Lat (ms) R Lat (ms) L-R Lat (ms) L Amp (mV) R Amp (mV) L-R Amp (%) Site1 Site2 L Vel (m/s) R Vel (m/s) L-R Vel (m/s)  Median Motor (Abd Poll Brev)  30.4C    Martin-Gruber  Wrist  *4.9   5.4  Elbow Wrist  *43   Elbow  9.2   5.8        Ulnar Motor (Abd Dig Min)  30.6C  Wrist  2.7   10.5  B Elbow Wrist  62   B Elbow  5.6   10.2  A Elbow B Elbow  100   A Elbow  6.6   10.2           Waveforms:

## 2023-07-25 ENCOUNTER — Other Ambulatory Visit: Payer: Self-pay | Admitting: Internal Medicine

## 2023-07-25 DIAGNOSIS — E876 Hypokalemia: Secondary | ICD-10-CM

## 2023-08-01 ENCOUNTER — Other Ambulatory Visit: Payer: Self-pay

## 2023-08-01 ENCOUNTER — Encounter: Payer: Self-pay | Admitting: Internal Medicine

## 2023-08-01 DIAGNOSIS — J3089 Other allergic rhinitis: Secondary | ICD-10-CM

## 2023-08-01 MED ORDER — CETIRIZINE HCL 10 MG PO TABS
10.0000 mg | ORAL_TABLET | Freq: Every day | ORAL | 3 refills | Status: AC
Start: 2023-08-01 — End: ?

## 2023-08-01 MED ORDER — FLUTICASONE PROPIONATE 50 MCG/ACT NA SUSP: 1.0000 | Freq: Two times a day (BID) | NASAL | 1 refills | Status: AC

## 2023-08-16 ENCOUNTER — Other Ambulatory Visit: Payer: Self-pay

## 2023-08-16 DIAGNOSIS — M51362 Other intervertebral disc degeneration, lumbar region with discogenic back pain and lower extremity pain: Secondary | ICD-10-CM

## 2023-08-16 MED ORDER — DICLOFENAC SODIUM 75 MG PO TBEC: 75.0000 mg | DELAYED_RELEASE_TABLET | Freq: Two times a day (BID) | ORAL | 0 refills | Status: DC | PRN

## 2023-08-21 ENCOUNTER — Encounter: Payer: Self-pay | Admitting: Internal Medicine

## 2023-08-28 ENCOUNTER — Encounter: Payer: Self-pay | Admitting: Orthopedic Surgery

## 2023-08-28 ENCOUNTER — Ambulatory Visit: Admitting: Orthopedic Surgery

## 2023-08-28 DIAGNOSIS — G5601 Carpal tunnel syndrome, right upper limb: Secondary | ICD-10-CM

## 2023-08-28 NOTE — H&P (View-Only) (Signed)
 Orthopaedic Clinic Return  Assessment: [name] [name] is a 58 y.o. female with the following: Right carpal tunnel syndrome; moderate on EMG  Plan: [name] [name] has pain, numbness and tingling in the right median nerve distribution, consistent with carpal tunnel syndrome.  Recent EMG demonstrates moderate compression of the median nerve.  Symptoms have been ongoing long enough, that she is interested in surgery.  Procedure was discussed in detail.  She would like to proceed with surgery on Sep 19, 2023.  Risks and benefits of the surgery, including, but not limited to infection, bleeding, persistent pain, need for further surgery, incomplete resolution of symptoms, recurrence of symptoms and more severe complications associated with anesthesia were discussed with the patient.  The patient has elected to proceed.     Follow-up: Return for After surgery; DOS 09/19/2023.   Subjective:  Chief Complaint  Patient presents with   Results    EMG Results    History of Present Illness: [name] [name] is a 58 y.o. female who returns to clinic for repeat evaluation of right hand pain.  She continues to have numbness, tingling and burning sensations in the right hand.  She also notes that her right hand is swollen.  It affects her ability to complete usual tasks around the house.  She has obtained an EMG, and is here to discuss the findings.  Review of Systems: No fevers or chills + numbness or tingling No chest pain No shortness of breath No bowel or bladder dysfunction No GI distress No headaches   Objective: LMP 05/21/1997   Physical Exam:  Alert and oriented.  No acute distress.  Right hand without obvious deformity.  Mild diffuse swelling.  Decree sensation in the median nerve distribution.  Positive Tinel's at the carpal tunnel.  Positive Phalen's at the carpal tunnel.  Positive carpal tunnel compression test.  No atrophy is appreciated.  IMAGING: I personally ordered and reviewed  the following images:  EMG demonstrates moderate compression of the median nerve at the right carpal tunnel  Oliver Barre, MD 08/28/2023 11:05 AM

## 2023-08-28 NOTE — Patient Instructions (Signed)
 Your surgery will be at [facility], scheduled with Dr Thane Edu   The hospital will contact you with a preoperative appointment to discuss Anesthesia. The phone number is [phone]   Please bring your medications with you for the appointment.  They will tell you the arrival time and medication instructions when you have your preoperative evaluation.  Do not wear nail polish the day of your surgery and if you take Phentermine you need to stop this medication ONE WEEK prior to your surgery.    If you take an blood thinning medication, we will need to stop this prior to surgery.  Typically, we stop this medicine at least 5 days prior to surgery.  We will need to confirm this with the doctor who prescribes this medication.  If you are taking medications or an injection for diabetes, or for weight management, this medicine will need to be stopped at least 7 days prior to surgery.     Surgery will be scheduled for 09/19/23 pending authorization by your insurance company.

## 2023-08-28 NOTE — Progress Notes (Signed)
 Orthopaedic Clinic Return  Assessment: [name] [name] is a 58 y.o. female with the following: Right carpal tunnel syndrome; moderate on EMG  Plan: [name] [name] has pain, numbness and tingling in the right median nerve distribution, consistent with carpal tunnel syndrome.  Recent EMG demonstrates moderate compression of the median nerve.  Symptoms have been ongoing long enough, that she is interested in surgery.  Procedure was discussed in detail.  She would like to proceed with surgery on Sep 19, 2023.  Risks and benefits of the surgery, including, but not limited to infection, bleeding, persistent pain, need for further surgery, incomplete resolution of symptoms, recurrence of symptoms and more severe complications associated with anesthesia were discussed with the patient.  The patient has elected to proceed.     Follow-up: Return for After surgery; DOS 09/19/2023.   Subjective:  Chief Complaint  Patient presents with   Results    EMG Results    History of Present Illness: [name] [name] is a 58 y.o. female who returns to clinic for repeat evaluation of right hand pain.  She continues to have numbness, tingling and burning sensations in the right hand.  She also notes that her right hand is swollen.  It affects her ability to complete usual tasks around the house.  She has obtained an EMG, and is here to discuss the findings.  Review of Systems: No fevers or chills + numbness or tingling No chest pain No shortness of breath No bowel or bladder dysfunction No GI distress No headaches   Objective: LMP 05/21/1997   Physical Exam:  Alert and oriented.  No acute distress.  Right hand without obvious deformity.  Mild diffuse swelling.  Decree sensation in the median nerve distribution.  Positive Tinel's at the carpal tunnel.  Positive Phalen's at the carpal tunnel.  Positive carpal tunnel compression test.  No atrophy is appreciated.  IMAGING: I personally ordered and reviewed  the following images:  EMG demonstrates moderate compression of the median nerve at the right carpal tunnel  Oliver Barre, MD 08/28/2023 11:05 AM

## 2023-08-29 ENCOUNTER — Other Ambulatory Visit: Payer: Self-pay

## 2023-08-29 ENCOUNTER — Other Ambulatory Visit: Payer: Self-pay | Admitting: Internal Medicine

## 2023-08-29 DIAGNOSIS — G5601 Carpal tunnel syndrome, right upper limb: Secondary | ICD-10-CM

## 2023-08-29 DIAGNOSIS — E782 Mixed hyperlipidemia: Secondary | ICD-10-CM

## 2023-08-29 DIAGNOSIS — M25531 Pain in right wrist: Secondary | ICD-10-CM

## 2023-08-29 DIAGNOSIS — R202 Paresthesia of skin: Secondary | ICD-10-CM

## 2023-08-29 DIAGNOSIS — M79641 Pain in right hand: Secondary | ICD-10-CM

## 2023-09-04 ENCOUNTER — Other Ambulatory Visit: Payer: Self-pay | Admitting: Internal Medicine

## 2023-09-04 DIAGNOSIS — K219 Gastro-esophageal reflux disease without esophagitis: Secondary | ICD-10-CM

## 2023-09-10 ENCOUNTER — Encounter: Payer: Self-pay | Admitting: Orthopedic Surgery

## 2023-09-10 MED ORDER — GABAPENTIN 100 MG PO CAPS: 100.0000 mg | ORAL_CAPSULE | Freq: Three times a day (TID) | ORAL | 0 refills | Status: AC

## 2023-09-16 ENCOUNTER — Encounter (HOSPITAL_COMMUNITY): Payer: Self-pay

## 2023-09-16 NOTE — Patient Instructions (Signed)
 [name] [name]  09/16/2023     @PREFPERIOPPHARMACY @   Your procedure is scheduled on 09/19/2023.   Report to [facility] at 6:00 A.M.   Call this number if you have problems the morning of surgery:  [phone]  If you experience any cold or flu symptoms such as cough, fever, chills, shortness of breath, etc. between now and your scheduled surgery, please notify us  at the above number.   Remember:   Do not eat  after midnight.  You may drink clear liquids until 3:30 AM .  Clear liquids allowed are:                    Water  and Clear Sports drink (No red color; diabetics please choose diet or no sugar options)    Please consume your ERAS drink at 3:30 AM and nothing after that.     Take these medicines the morning of surgery with A SIP OF WATER  : Zyrtec  Coenzyme Voltaren  Flonase  Neurontin  Protonix  and Xanax  (if needed).     Do not wear jewelry, make-up or nail polish, including gel polish,  artificial nails, or any other type of covering on natural nails (fingers and  toes).  Do not wear lotions, powders, or perfumes, or deodorant.  Do not shave 48 hours prior to surgery.  Men may shave face and neck.  Do not bring valuables to the hospital.  [facility] is not responsible for any belongings or valuables.  Contacts, dentures or bridgework may not be worn into surgery.  Leave your suitcase in the car.  After surgery it may be brought to your room.  For patients admitted to the hospital, discharge time will be determined by your treatment team.  Patients discharged the day of surgery will not be allowed to drive home.   Name and phone number of your driver:   Family  Special instructions:  N/A  Please read over the following fact sheets that you were given.  Care and Recovery After Surgery  Endoscopic Carpal Tunnel Release: What to Expect Endoscopic carpal tunnel release is a surgery to relieve symptoms caused by carpal tunnel syndrome (CTS). CTS causes swelling in a  narrow space in your wrist. The swelling pinches the median nerve, causing pain, numbness, and weakness in your hand. You may need this surgery if other treatments haven't helped. It's a minimally invasive surgery, which means a small camera is used to guide the surgery. It requires only small cuts and has a shorter healing time compared to open surgery. To do the surgery, the surgeon cuts a ligament in your wrist. Ligaments are the tissues that connect bones to each other. Cutting the ligament relieves pressure on the median nerve. Tell your health care provider about: Any allergies you have. All medicines you take. These include vitamins, herbs, eye drops, and creams. Any problems you or family members have had with anesthesia. Any bleeding problems you have. Any surgeries you've had. Any medical problems you have. Whether you're pregnant or may be pregnant. What are the risks? Your health care provider will talk with you about risks. These may include: Infection. Bleeding. Allergic reactions to medicines. Damage to the nerve, a blood vessel, or other nearby structures. The need to change to an open surgery. Failed treatment. The surgery fails to treat your symptoms or make your symptoms worse. Long-term weakness of hand. What happens before? When to stop eating and drinking Eat and drink only as you've been told. You may  be told this: 8 hours before your surgery Stop eating most foods. Do not eat meat, fried foods, or fatty foods. Eat only light foods, such as toast or crackers. All liquids are OK except energy drinks and alcohol. 6 hours before your surgery Stop eating. Drink only clear liquids, such as water , clear fruit juice, black coffee, plain tea, and sports drinks. Do not drink energy drinks or alcohol. 2 hours before your surgery Stop drinking all liquids. You may be allowed to take medicines with small sips of water . If you do not eat and drink as told, your surgery may  be delayed or canceled. Medicines Ask about changing or stopping: Any medicines you take. Any vitamins, herbs, or supplements you take. Do not take aspirin  or ibuprofen  unless you're told to. Surgery safety For your safety, you may: Need to wash your skin with a soap that kills germs. Get antibiotics. Have your surgery site marked. Have hair removed at the surgery site. General instructions Ask if you'll be staying overnight in the hospital. If you'll be going home right after the surgery, plan to have a responsible adult: Drive you home from the hospital or clinic. You won't be allowed to drive. Stay with you for the time you're told. Do not smoke, vape, or use nicotine or tobacco for at least 4 weeks before the surgery. What happens during endoscopic carpal tunnel release?  An IV will be put into a vein in your hand or arm. You may be given: A sedative to help you relax. Anesthesia to keep you from feeling pain. A small cut will be made in the crease of your wrist, near the bottom of your palm. Another cut may be made in the palm of your hand. The endoscope will be placed through one of the cuts. This enables your surgeon to see the carpal tunnel, the median nerve, and the transverse carpal ligament. An endoscopic knife will be inserted through one of the cuts. Your surgeon will cut the ligament with an upward motion. The endoscope and knife will be removed. The cut or cuts will be closed with stitches. A compression bandage will be wrapped around your hand and wrist. These steps may vary. Ask what you can expect. What happens after? You'll be watched closely until you leave. This includes checking your pain level, blood pressure, heart rate, and breathing rate. You'll be given pain medicine as needed. A splint or brace may be placed over your bandage. This will hold your hand and wrist in place while you heal. This information is not intended to replace advice given to you by  your health care provider. Make sure you discuss any questions you have with your health care provider. Document Revised: 02/04/2023 Document Reviewed: 02/04/2023 Elsevier Patient Education  2024 [employer]  General Anesthesia, Adult General anesthesia is the use of medicine to make you fall asleep (unconscious) for a medical procedure. General anesthesia must be used for certain procedures. It is often recommended for surgery or procedures that: Last a long time. Require you to be still or in an unusual position. Are major and can cause blood loss. Affect your breathing. The medicines used for general anesthesia are called general anesthetics. During general anesthesia, these medicines are given along with medicines that: Prevent pain. Control your blood pressure. Relax your muscles. Prevent nausea and vomiting after the procedure. Tell a health care provider about: Any allergies you have. All medicines you are taking, including vitamins, herbs, eye drops, creams, and over-the-counter  medicines. Your history of any: Medical conditions you have, including: High blood pressure. Bleeding problems. Diabetes. Heart or lung conditions, such as: Heart failure. Sleep apnea. Asthma. Chronic obstructive pulmonary disease (COPD). Current or recent illnesses, such as: Upper respiratory, chest, or ear infections. Cough or fever. Tobacco or drug use, including marijuana or alcohol use. Depression or anxiety. Surgeries and types of anesthetics you have had. Problems you or family members have had with anesthetic medicines. Whether you are pregnant or may be pregnant. Whether you have any chipped or loose teeth, dentures, caps, bridgework, or issues with your mouth, swallowing, or choking. What are the risks? Your health care provider will talk with you about risks. These may include: Allergic reaction to the medicines. Lung and heart problems. Inhaling food or liquid from the stomach  into the lungs (aspiration). Nerve injury. Injury to the lips, mouth, teeth, or gums. Stroke. Waking up during your procedure and being unable to move. This is rare. These problems are more likely to develop if you are having a major surgery or if you have an advanced or serious medical condition. You can prevent some of these complications by answering all of your health care provider's questions thoroughly and by following all instructions before your procedure. General anesthesia can cause side effects, including: Nausea or vomiting. A sore throat or hoarseness from the breathing tube. Wheezing or coughing. Shaking chills or feeling cold. Body aches. Sleepiness. Confusion, agitation (delirium), or anxiety. What happens before the procedure? When to stop eating and drinking Follow instructions from your health care provider about what you may eat and drink before your procedure. If you do not follow your health care provider's instructions, your procedure may be delayed or canceled. Medicines Ask your health care provider about: Changing or stopping your regular medicines. These include any diabetes medicines or blood thinners you take. Taking medicines such as aspirin  and ibuprofen . These medicines can thin your blood. Do not take them unless your health care provider tells you to. Taking over-the-counter medicines, vitamins, herbs, and supplements. General instructions Do not use any products that contain nicotine or tobacco for at least 4 weeks before the procedure. These products include cigarettes, chewing tobacco, and vaping devices, such as e-cigarettes. If you need help quitting, ask your health care provider. If you brush your teeth on the morning of the procedure, make sure to spit out all of the water  and toothpaste. If told by your health care provider, bring your sleep apnea device with you to surgery (if applicable). If you will be going home right after the procedure, plan  to have a responsible adult: Take you home from the hospital or clinic. You will not be allowed to drive. Care for you for the time you are told. What happens during the procedure?  An IV will be inserted into one of your veins. You will be given one or more of the following through a face mask or IV: A sedative. This helps you relax. Anesthesia. This will: Numb certain areas of your body. Make you fall asleep for surgery. After you are unconscious, a breathing tube may be inserted down your throat to help you breathe. This will be removed before you wake up. An anesthesia provider, such as an anesthesiologist, will stay with you throughout your procedure. The anesthesia provider will: Keep you comfortable and safe by continuing to give you medicines and adjusting the amount of medicine that you get. Monitor your blood pressure, heart rate, and oxygen levels to make  sure that the anesthetics do not cause any problems. The procedure may vary among health care providers and hospitals. What happens after the procedure? Your blood pressure, temperature, heart rate, breathing rate, and blood oxygen level will be monitored until you leave the hospital or clinic. You will wake up in a recovery area. You may wake up slowly. You may be given medicine to help you with pain, nausea, or any other side effects from the anesthesia. Summary General anesthesia is the use of medicine to make you fall asleep (unconscious) for a medical procedure. Follow your health care provider's instructions about when to stop eating, drinking, or taking certain medicines before your procedure. Plan to have a responsible adult take you home from the hospital or clinic. This information is not intended to replace advice given to you by your health care provider. Make sure you discuss any questions you have with your health care provider. Document Revised: 08/03/2021 Document Reviewed: 08/03/2021 Elsevier Patient Education   2024 [employer].  How to Use an Incentive Spirometer An incentive spirometer is a tool that measures how well you are filling your lungs with each breath. Learning to take long, deep breaths using this tool can help you keep your lungs clear and active. This may help to reverse or lessen your chance of developing breathing (pulmonary) problems, especially infection. You may be asked to use a spirometer: After a surgery. If you have a lung problem or a history of smoking. After a long period of time when you have been unable to move or be active. If the spirometer includes an indicator to show the highest number that you have reached, your health care provider or respiratory therapist will help you set a goal. Keep a log of your progress as told by your health care provider. What are the risks? Breathing too quickly may cause dizziness or cause you to pass out. Take your time so you do not get dizzy or light-headed. If you are in pain, you may need to take pain medicine before doing incentive spirometry. It is harder to take a deep breath if you are having pain. How to use your incentive spirometer  Sit up on the edge of your bed or on a chair. Hold the incentive spirometer so that it is in an upright position. Before you use the spirometer, breathe out normally. Place the mouthpiece in your mouth. Make sure your lips are closed tightly around it. Breathe in slowly and as deeply as you can through your mouth, causing the piston or the ball to rise toward the top of the chamber. Hold your breath for 3-5 seconds, or for as long as possible. If the spirometer includes a coach indicator, use this to guide you in breathing. Slow down your breathing if the indicator goes above the marked areas. Remove the mouthpiece from your mouth and breathe out normally. The piston or ball will return to the bottom of the chamber. Rest for a few seconds, then repeat the steps 10 or more times. Take your time and  take a few normal breaths between deep breaths so that you do not get dizzy or light-headed. Do this every 1-2 hours when you are awake. If the spirometer includes a goal marker to show the highest number you have reached (best effort), use this as a goal to work toward during each repetition. After each set of 10 deep breaths, cough a few times. This will help to make sure that your lungs are clear.  If you have an incision on your chest or abdomen from surgery, place a pillow or a rolled-up towel firmly against the incision when you cough. This can help to reduce pain while taking deep breaths and coughing. General tips When you are able to get out of bed: Walk around often. Continue to take deep breaths and cough in order to clear your lungs. Keep using the incentive spirometer until your health care provider says it is okay to stop using it. If you have been in the hospital, you may be told to keep using the spirometer at home. Contact a health care provider if: You are having difficulty using the spirometer. You have trouble using the spirometer as often as instructed. Your pain medicine is not giving enough relief for you to use the spirometer as told. You have a fever. Get help right away if: You develop shortness of breath. You develop a cough with bloody mucus from the lungs. You have fluid or blood coming from an incision site after you cough. Summary An incentive spirometer is a tool that can help you learn to take long, deep breaths to keep your lungs clear and active. You may be asked to use a spirometer after a surgery, if you have a lung problem or a history of smoking, or if you have been inactive for a long period of time. Use your incentive spirometer as instructed every 1-2 hours while you are awake. If you have an incision on your chest or abdomen, place a pillow or a rolled-up towel firmly against your incision when you cough. This will help to reduce pain. Get help right  away if you have shortness of breath, you cough up bloody mucus, or blood comes from your incision when you cough. This information is not intended to replace advice given to you by your health care provider. Make sure you discuss any questions you have with your health care provider. Document Revised: 03/15/2023 Document Reviewed: 03/15/2023 Elsevier Patient Education  2024 [employer].

## 2023-09-17 ENCOUNTER — Encounter (HOSPITAL_COMMUNITY): Payer: Self-pay

## 2023-09-17 ENCOUNTER — Encounter (HOSPITAL_COMMUNITY)
Admission: RE | Admit: 2023-09-17 | Discharge: 2023-09-17 | Disposition: A | Discharge status: Home / Self Care | Source: Ambulatory Visit | Attending: Orthopedic Surgery | Admitting: Orthopedic Surgery

## 2023-09-17 ENCOUNTER — Other Ambulatory Visit: Payer: Self-pay

## 2023-09-17 VITALS — BP 133/88 | HR 86 | Temp 98.0°F | Resp 18 | Ht 62.0 in | Wt 175.0 lb

## 2023-09-17 DIAGNOSIS — G5601 Carpal tunnel syndrome, right upper limb: Secondary | ICD-10-CM | POA: Diagnosis not present

## 2023-09-17 DIAGNOSIS — G4730 Sleep apnea, unspecified: Secondary | ICD-10-CM | POA: Diagnosis not present

## 2023-09-17 DIAGNOSIS — T502X5A Adverse effect of carbonic-anhydrase inhibitors, benzothiadiazides and other diuretics, initial encounter: Secondary | ICD-10-CM | POA: Insufficient documentation

## 2023-09-17 DIAGNOSIS — R7303 Prediabetes: Secondary | ICD-10-CM

## 2023-09-17 DIAGNOSIS — I10 Essential (primary) hypertension: Secondary | ICD-10-CM | POA: Diagnosis not present

## 2023-09-17 DIAGNOSIS — F419 Anxiety disorder, unspecified: Secondary | ICD-10-CM | POA: Diagnosis not present

## 2023-09-17 DIAGNOSIS — E876 Hypokalemia: Secondary | ICD-10-CM

## 2023-09-17 DIAGNOSIS — Z79899 Other long term (current) drug therapy: Secondary | ICD-10-CM | POA: Diagnosis not present

## 2023-09-17 DIAGNOSIS — K219 Gastro-esophageal reflux disease without esophagitis: Secondary | ICD-10-CM | POA: Diagnosis not present

## 2023-09-17 DIAGNOSIS — F32A Depression, unspecified: Secondary | ICD-10-CM | POA: Diagnosis not present

## 2023-09-17 DIAGNOSIS — Z01818 Encounter for other preprocedural examination: Secondary | ICD-10-CM | POA: Insufficient documentation

## 2023-09-17 DIAGNOSIS — Z8673 Personal history of transient ischemic attack (TIA), and cerebral infarction without residual deficits: Secondary | ICD-10-CM | POA: Diagnosis not present

## 2023-09-17 DIAGNOSIS — Z87891 Personal history of nicotine dependence: Secondary | ICD-10-CM | POA: Diagnosis not present

## 2023-09-17 DIAGNOSIS — D500 Iron deficiency anemia secondary to blood loss (chronic): Secondary | ICD-10-CM | POA: Insufficient documentation

## 2023-09-17 HISTORY — DX: Sleep apnea, unspecified: G47.30

## 2023-09-17 HISTORY — DX: Prediabetes: R73.03

## 2023-09-17 LAB — CBC WITH DIFFERENTIAL/PLATELET
Abs Immature Granulocytes: 0.01 10*3/uL (ref 0.00–0.07)
Basophils Absolute: 0 10*3/uL (ref 0.0–0.1)
Basophils Relative: 1 %
Eosinophils Absolute: 0.1 10*3/uL (ref 0.0–0.5)
Eosinophils Relative: 2 %
HCT: 37.2 % (ref 36.0–46.0)
Hemoglobin: 11.9 g/dL — ABNORMAL LOW (ref 12.0–15.0)
Immature Granulocytes: 0 %
Lymphocytes Relative: 37 %
Lymphs Abs: 1.9 10*3/uL (ref 0.7–4.0)
MCH: 29.4 pg (ref 26.0–34.0)
MCHC: 32 g/dL (ref 30.0–36.0)
MCV: 91.9 fL (ref 80.0–100.0)
Monocytes Absolute: 0.4 10*3/uL (ref 0.1–1.0)
Monocytes Relative: 7 %
Neutro Abs: 2.8 10*3/uL (ref 1.7–7.7)
Neutrophils Relative %: 53 %
Platelets: 217 10*3/uL (ref 150–400)
RBC: 4.05 MIL/uL (ref 3.87–5.11)
RDW: 13.2 % (ref 11.5–15.5)
WBC: 5.2 10*3/uL (ref 4.0–10.5)
nRBC: 0 % (ref 0.0–0.2)

## 2023-09-17 LAB — BASIC METABOLIC PANEL WITH GFR
Anion gap: 10 (ref 5–15)
BUN: 14 mg/dL (ref 6–20)
CO2: 23 mmol/L (ref 22–32)
Calcium: 9.4 mg/dL (ref 8.9–10.3)
Chloride: 103 mmol/L (ref 98–111)
Creatinine, Ser: 1.1 mg/dL — ABNORMAL HIGH (ref 0.44–1.00)
GFR, Estimated: 59 mL/min — ABNORMAL LOW (ref >60)
Glucose, Bld: 141 mg/dL — ABNORMAL HIGH (ref 70–99)
Potassium: 3.7 mmol/L (ref 3.5–5.1)
Sodium: 136 mmol/L (ref 135–145)

## 2023-09-17 LAB — HEMOGLOBIN A1C
Hgb A1c MFr Bld: 5.4 % (ref 4.8–5.6)
Mean Plasma Glucose: 108.28 mg/dL

## 2023-09-18 NOTE — Anesthesia Preprocedure Evaluation (Addendum)
 Anesthesia Evaluation  Patient identified by MRN, date of birth, ID band Patient awake    Reviewed: Allergy & Precautions, H&P , NPO status , Patient's Chart, lab work & pertinent test results, reviewed documented beta blocker date and time   Airway Mallampati: II  TM Distance: >3 FB Neck ROM: full    Dental no notable dental hx. (+) Dental Advisory Given, Teeth Intact   Pulmonary sleep apnea , former smoker   Pulmonary exam normal breath sounds clear to auscultation       Cardiovascular Exercise Tolerance: Good hypertension, Normal cardiovascular exam Rhythm:regular Rate:Normal     Neuro/Psych  Headaches PSYCHIATRIC DISORDERS Anxiety Depression    vertigo  Neuromuscular disease CVA, No Residual Symptoms    GI/Hepatic Neg liver ROS,GERD  Medicated,,  Endo/Other  negative endocrine ROS    Renal/GU negative Renal ROS  negative genitourinary   Musculoskeletal  (+) Arthritis , Osteoarthritis,    Abdominal   Peds  Hematology negative hematology ROS (+) anemia   Anesthesia Other Findings   Reproductive/Obstetrics negative OB ROS                             Anesthesia Physical Anesthesia Plan  ASA: 3  Anesthesia Plan: General   Post-op Pain Management: Minimal or no pain anticipated   Induction: Intravenous  PONV Risk Score and Plan: Propofol  infusion  Airway Management Planned: Nasal Cannula and Natural Airway  Additional Equipment: None  Intra-op Plan:   Post-operative Plan:   Informed Consent: I have reviewed the patients History and Physical, chart, labs and discussed the procedure including the risks, benefits and alternatives for the proposed anesthesia with the patient or authorized representative who has indicated his/her understanding and acceptance.     Dental advisory given  Plan Discussed with: CRNA  Anesthesia Plan Comments:         Anesthesia Quick  Evaluation

## 2023-09-19 ENCOUNTER — Ambulatory Visit (HOSPITAL_COMMUNITY)
Admission: RE | Admit: 2023-09-19 | Discharge: 2023-09-19 | Disposition: A | Discharge status: Home / Self Care | Attending: Orthopedic Surgery | Admitting: Orthopedic Surgery

## 2023-09-19 ENCOUNTER — Ambulatory Visit (HOSPITAL_COMMUNITY): Payer: Self-pay | Admitting: Certified Registered"

## 2023-09-19 ENCOUNTER — Encounter (HOSPITAL_COMMUNITY): Payer: Self-pay | Admitting: Orthopedic Surgery

## 2023-09-19 ENCOUNTER — Encounter (HOSPITAL_COMMUNITY)
Admission: RE | Disposition: A | Discharge status: Home / Self Care | Payer: Self-pay | Source: Home / Self Care | Attending: Orthopedic Surgery

## 2023-09-19 DIAGNOSIS — Z79899 Other long term (current) drug therapy: Secondary | ICD-10-CM | POA: Insufficient documentation

## 2023-09-19 DIAGNOSIS — G4730 Sleep apnea, unspecified: Secondary | ICD-10-CM | POA: Diagnosis not present

## 2023-09-19 DIAGNOSIS — G5601 Carpal tunnel syndrome, right upper limb: Secondary | ICD-10-CM | POA: Diagnosis not present

## 2023-09-19 DIAGNOSIS — Z87891 Personal history of nicotine dependence: Secondary | ICD-10-CM | POA: Diagnosis not present

## 2023-09-19 DIAGNOSIS — F419 Anxiety disorder, unspecified: Secondary | ICD-10-CM | POA: Insufficient documentation

## 2023-09-19 DIAGNOSIS — I10 Essential (primary) hypertension: Secondary | ICD-10-CM | POA: Insufficient documentation

## 2023-09-19 DIAGNOSIS — Z8673 Personal history of transient ischemic attack (TIA), and cerebral infarction without residual deficits: Secondary | ICD-10-CM | POA: Insufficient documentation

## 2023-09-19 DIAGNOSIS — K219 Gastro-esophageal reflux disease without esophagitis: Secondary | ICD-10-CM | POA: Diagnosis not present

## 2023-09-19 DIAGNOSIS — F32A Depression, unspecified: Secondary | ICD-10-CM | POA: Insufficient documentation

## 2023-09-19 HISTORY — PX: CARPAL TUNNEL RELEASE: SHX101

## 2023-09-19 LAB — GLUCOSE, CAPILLARY: Glucose-Capillary: 99 mg/dL (ref 70–99)

## 2023-09-19 SURGERY — CARPAL TUNNEL RELEASE
Anesthesia: General | Site: Hand | Laterality: Right

## 2023-09-19 MED ORDER — 0.9 % SODIUM CHLORIDE (POUR BTL) OPTIME
TOPICAL | Status: DC | PRN
Start: 2023-09-19 — End: 2023-09-19
  Administered 2023-09-19: 1000 mL

## 2023-09-19 MED ORDER — FENTANYL CITRATE PF 50 MCG/ML IJ SOSY
25.0000 ug | PREFILLED_SYRINGE | INTRAMUSCULAR | Status: DC | PRN
  Administered 2023-09-19: 25 ug via INTRAVENOUS
  Administered 2023-09-19: 0.25 ug via INTRAVENOUS
  Filled 2023-09-19: qty 1

## 2023-09-19 MED ORDER — LACTATED RINGERS IV SOLN: INTRAVENOUS | Status: DC

## 2023-09-19 MED ORDER — FENTANYL CITRATE (PF) 100 MCG/2ML IJ SOLN
INTRAMUSCULAR | Status: DC | PRN
  Administered 2023-09-19 (×2): 25 ug via INTRAVENOUS
  Administered 2023-09-19: 50 ug via INTRAVENOUS

## 2023-09-19 MED ORDER — LIDOCAINE 2% (20 MG/ML) 5 ML SYRINGE
INTRAMUSCULAR | Status: DC | PRN
  Administered 2023-09-19: 75 mg via INTRAVENOUS

## 2023-09-19 MED ORDER — CHLORHEXIDINE GLUCONATE 0.12 % MT SOLN
15.0000 mL | Freq: Once | OROMUCOSAL | Status: AC
  Administered 2023-09-19: 15 mL via OROMUCOSAL
  Filled 2023-09-19: qty 15

## 2023-09-19 MED ORDER — ORAL CARE MOUTH RINSE: 15.0000 mL | Freq: Once | OROMUCOSAL | Status: AC

## 2023-09-19 MED ORDER — ONDANSETRON HCL 4 MG/2ML IJ SOLN
INTRAMUSCULAR | Status: DC | PRN
Start: 2023-09-19 — End: 2023-09-19
  Administered 2023-09-19: 4 mg via INTRAVENOUS

## 2023-09-19 MED ORDER — PROPOFOL 10 MG/ML IV BOLUS
INTRAVENOUS | Status: DC | PRN
  Administered 2023-09-19: 200 mg via INTRAVENOUS

## 2023-09-19 MED ORDER — ACETAMINOPHEN 500 MG PO TABS
1000.0000 mg | ORAL_TABLET | Freq: Once | ORAL | Status: AC
  Administered 2023-09-19: 1000 mg via ORAL
  Filled 2023-09-19: qty 2

## 2023-09-19 MED ORDER — OXYCODONE HCL 5 MG PO TABS: 5.0000 mg | ORAL_TABLET | Freq: Once | ORAL | Status: DC | PRN

## 2023-09-19 MED ORDER — HYDROCODONE-ACETAMINOPHEN 5-325 MG PO TABS: 1.0000 | ORAL_TABLET | Freq: Four times a day (QID) | ORAL | 0 refills | Status: AC | PRN

## 2023-09-19 MED ORDER — BUPIVACAINE HCL (PF) 0.5 % IJ SOLN
INTRAMUSCULAR | Status: DC | PRN
  Administered 2023-09-19: 10 mL

## 2023-09-19 MED ORDER — OXYCODONE HCL 5 MG/5ML PO SOLN: 5.0000 mg | Freq: Once | ORAL | Status: DC | PRN

## 2023-09-19 MED ORDER — ACETAMINOPHEN 160 MG/5ML PO SOLN
960.0000 mg | Freq: Once | ORAL | Status: AC
  Filled 2023-09-19: qty 30

## 2023-09-19 MED ORDER — PROPOFOL 500 MG/50ML IV EMUL
INTRAVENOUS | Status: AC
  Filled 2023-09-19: qty 50

## 2023-09-19 MED ORDER — CEFAZOLIN SODIUM-DEXTROSE 2-4 GM/100ML-% IV SOLN
2.0000 g | INTRAVENOUS | Status: AC
  Administered 2023-09-19: 2 g via INTRAVENOUS
  Filled 2023-09-19: qty 100

## 2023-09-19 MED ORDER — FENTANYL CITRATE (PF) 100 MCG/2ML IJ SOLN
INTRAMUSCULAR | Status: AC
  Filled 2023-09-19: qty 2

## 2023-09-19 MED ORDER — LIDOCAINE 2% (20 MG/ML) 5 ML SYRINGE
INTRAMUSCULAR | Status: AC
  Filled 2023-09-19: qty 5

## 2023-09-19 MED ORDER — SODIUM CHLORIDE 0.9 % IV SOLN: 12.5000 mg | INTRAVENOUS | Status: DC | PRN

## 2023-09-19 MED ORDER — ONDANSETRON HCL 4 MG PO TABS: 4.0000 mg | ORAL_TABLET | Freq: Three times a day (TID) | ORAL | 0 refills | Status: AC | PRN

## 2023-09-19 MED ORDER — ONDANSETRON HCL 4 MG/2ML IJ SOLN
INTRAMUSCULAR | Status: AC
Start: 2023-09-19 — End: ?
  Filled 2023-09-19: qty 2

## 2023-09-19 SURGICAL SUPPLY — 30 items
BANDAGE ESMARK 4X12 BL STRL LF (DISPOSABLE) ×1 IMPLANT
BLADE SURG 15 STRL LF DISP TIS (BLADE) ×1 IMPLANT
BNDG ELASTIC 3X5.8 VLCR NS LF (GAUZE/BANDAGES/DRESSINGS) ×1 IMPLANT
BNDG GAUZE DERMACEA FLUFF 4 (GAUZE/BANDAGES/DRESSINGS) IMPLANT
CHLORAPREP W/TINT 26 (MISCELLANEOUS) ×1 IMPLANT
CLOTH BEACON ORANGE TIMEOUT ST (SAFETY) ×1 IMPLANT
CORD BIPOLAR FORCEPS 12FT (ELECTRODE) ×1 IMPLANT
COVER LIGHT HANDLE STERIS (MISCELLANEOUS) ×2 IMPLANT
CUFF TOURN SGL QUICK 18X4 (TOURNIQUET CUFF) ×1 IMPLANT
DRAPE HALF SHEET 40X57 (DRAPES) ×1 IMPLANT
GAUZE SPONGE 4X4 12PLY STRL (GAUZE/BANDAGES/DRESSINGS) ×1 IMPLANT
GAUZE XEROFORM 1X8 LF (GAUZE/BANDAGES/DRESSINGS) ×1 IMPLANT
GLOVE BIO SURGEON STRL SZ8 (GLOVE) ×3 IMPLANT
GLOVE BIOGEL PI IND STRL 7.0 (GLOVE) ×2 IMPLANT
GLOVE BIOGEL PI IND STRL 8 (GLOVE) ×1 IMPLANT
GOWN STRL REUS W/TWL LRG LVL3 (GOWN DISPOSABLE) ×1 IMPLANT
GOWN STRL REUS W/TWL XL LVL3 (GOWN DISPOSABLE) ×1 IMPLANT
KIT TURNOVER KIT A (KITS) ×1 IMPLANT
MANIFOLD NEPTUNE II (INSTRUMENTS) ×1 IMPLANT
NDL HYPO 21X1.5 SAFETY (NEEDLE) ×1 IMPLANT
NEEDLE HYPO 21X1.5 SAFETY (NEEDLE) ×1 IMPLANT
NS IRRIG 1000ML POUR BTL (IV SOLUTION) ×1 IMPLANT
PACK BASIC LIMB (CUSTOM PROCEDURE TRAY) ×1 IMPLANT
PAD ARMBOARD POSITIONER FOAM (MISCELLANEOUS) ×1 IMPLANT
POSITIONER HAND ALUMI XLG (MISCELLANEOUS) ×1 IMPLANT
POSITIONER HEAD 8X9X4 ADT (SOFTGOODS) ×1 IMPLANT
SET BASIN LINEN APH (SET/KITS/TRAYS/PACK) ×1 IMPLANT
SUT PROLENE NAB BLUE 3-0 30IN (SUTURE) IMPLANT
SYR CONTROL 10ML LL (SYRINGE) ×1 IMPLANT
UNDERPAD 30X36 HEAVY ABSORB (UNDERPADS AND DIAPERS) ×1 IMPLANT

## 2023-09-19 NOTE — Op Note (Signed)
 Orthopaedic Surgery Operative Note (CSN: [id number])  [name] [name]  Nov 11, 1965 Date of Surgery: 09/19/2023   Diagnoses:  Right carpal tunnel syndrome  Procedure: Right Open Carpal Tunnel Release   Operative Finding Successful completion of the planned procedure.     Post-Op Diagnosis: Same Surgeons:Primary: Tonita Frater, MD Assistants: None Location: [facility] OR ROOM 3 Anesthesia: General with local anesthesia Antibiotics: Ancef  2 g Tourniquet time:  Total Tourniquet Time Documented: Upper Arm (Right) - 25 minutes Total: Upper Arm (Right) - 25 minutes  Estimated Blood Loss: 5 cc Complications: None Specimens: None  Implants: None  Indications for Surgery:   [name] [name] is a 58 y.o. female with symptoms consistent with carpal tunnel syndrome.  Symptoms have been ongoing, and progressively worsening.  They have tried medications and bracing without improvement in symptoms.  EMG results demonstrate moderate carpal tunnel syndrome.  Risks and benefits of operative and nonoperative management were discussed prior to surgery with the patient and informed consent form was completed.  Specific risks including infection, need for additional surgery, bleeding, recurrent symptoms, incomplete resolution of symptoms, persistent pain and more severe complications associated with anesthesia were discussed.  All questions were answered.  Surgical consent was finalized.    Procedure:   The patient was identified properly. Informed consent was finalized and the surgical site was marked. The patient was taken to the OR where general anesthesia was induced.  The patient was positioned supine, on a hand table.  The right arm was prepped and draped in the usual sterile fashion.  Timeout was performed before the beginning of the case.  Tourniquet was used for the above duration.  Antibiotics were administered prior to making incision.  Incision was made in line with the radial border of the ring  finger. The carpal tunnel transverse fascia was identified, cleaned, and incised sharply. The common sensory branches were visualized along with the superficial palmar arch and protected.  The median nerve was protected below. Deep retractors were placed underneath the transverse carpal ligament, protecting the nerve. I released the ligament completely, and then released the distal volar forearm fascia. The nerve was identified, and visualized, and protected throughout the case. No masses or abnormalities were identified in ulnar bursa.  The wounds were irrigated copiously, and the wounds injected. Skin closed with interrupted sutures followed by a bulky dressing. Patient tolerated this well, with no complications.   Post-operative plan:  The patient will be discharged home from the PACU. WBAT on the operative extremity; limit lifting to nothing more than a coffee cup until follow up appointment   DVT prophylaxis not indicated in this ambulatory upper extremity patient without significant risk factors.    Pain control with PRN pain medication preferring oral medicines.   Follow up plan will be scheduled in approximately 7-10 days for incision check

## 2023-09-19 NOTE — Anesthesia Postprocedure Evaluation (Signed)
 Anesthesia Post Note  Patient: [name] [name]  Procedure(s) Performed: CARPAL TUNNEL RELEASE (Right: Hand)  Patient location during evaluation: PACU Anesthesia Type: General Level of consciousness: awake and alert Pain management: pain level controlled Vital Signs Assessment: post-procedure vital signs reviewed and stable Respiratory status: spontaneous breathing, nonlabored ventilation, respiratory function stable and patient connected to nasal cannula oxygen Cardiovascular status: blood pressure returned to baseline and stable Postop Assessment: no apparent nausea or vomiting Anesthetic complications: no   There were no known notable events for this encounter.   Last Vitals:  Vitals:   09/19/23 0925 09/19/23 0926  BP:  (!) 141/84  Pulse: 63   Resp: 16   Temp: 36.6 C   SpO2: 99%     Last Pain:  Vitals:   09/19/23 0925  TempSrc: Oral  PainSc:                  Sandy Crumb

## 2023-09-19 NOTE — Anesthesia Procedure Notes (Signed)
 Procedure Name: LMA Insertion Date/Time: 09/19/2023 7:39 AM  Performed by: Leeanne Puffer, CRNAPre-anesthesia Checklist: Patient identified, Patient being monitored, Emergency Drugs available, Timeout performed and Suction available Patient Re-evaluated:Patient Re-evaluated prior to induction Oxygen Delivery Method: Circle System Utilized Preoxygenation: Pre-oxygenation with 100% oxygen Induction Type: IV induction Ventilation: Mask ventilation without difficulty LMA: LMA inserted LMA Size: 3.0 Number of attempts: 1 Placement Confirmation: positive ETCO2 and breath sounds checked- equal and bilateral

## 2023-09-19 NOTE — Transfer of Care (Signed)
 Immediate Anesthesia Transfer of Care Note  Patient: [name] [name]  Procedure(s) Performed: CARPAL TUNNEL RELEASE (Right: Hand)  Patient Location: PACU  Anesthesia Type:General  Level of Consciousness: awake  Airway & Oxygen Therapy: Patient Spontanous Breathing  Post-op Assessment: Report given to RN  Post vital signs: Reviewed and stable  Last Vitals:  Vitals Value Taken Time  BP 161/144 09/19/23 0835  Temp 36.3 C 09/19/23 0834  Pulse 86 09/19/23 0843  Resp 16 09/19/23 0843  SpO2 93 % 09/19/23 0843  Vitals shown include unfiled device data.  Last Pain:  Vitals:   09/19/23 0652  PainSc: 0-No pain         Complications: No notable events documented.

## 2023-09-19 NOTE — Discharge Instructions (Addendum)
  Mark A. Dallas Schimke, MD [facility] [address] [place],  [place]  [address] Phone: [phone] Fax: [phone]    POST-OPERATIVE INSTRUCTIONS   WOUND CARE You may remove your bandage on postop day 3 and get the hand wet.  No ointments or lotions to be applied to the incision.  Do not submerge the incision for 1 month.  FOLLOW-UP If you develop a Fever (>101.5), Redness or Drainage from the surgical incision site, please call our office to arrange for an evaluation. Please call the office to schedule a follow-up appointment for your incision check if you do not already have one, 7-10 days post-operatively.  IF YOU HAVE ANY QUESTIONS, PLEASE FEEL FREE TO CALL OUR OFFICE.  HELPFUL INFORMATION  You should wean off your narcotic medicines as soon as you are able.  Most patients will be off or using minimal narcotics before their first postop appointment.   You may be more comfortable sleeping in a semi-seated position the first few nights following surgery.  Keep a pillow propped under the elbow and forearm for comfort.  If you have a recliner type of chair it might be beneficial.    We suggest you use the pain medication the first night prior to going to bed, in order to ease any pain when the anesthesia wears off. You should avoid taking pain medications on an empty stomach as it will make you nauseous.  Do not drink alcoholic beverages or take illicit drugs when taking pain medications.  You may return to work/school in the next couple of days when you feel up to it. Desk work and typing is fine.  Pain medication may make you constipated.  Below are a few solutions to try in this order: Decrease the amount of pain medication if you aren't having pain. Drink lots of decaffeinated fluids. Drink prune juice and/or each dried prunes  If the first 3 don't work start with additional solutions Take Colace - an over-the-counter stool softener Take  Senokot - an over-the-counter laxative Take Miralax - a stronger over-the-counter laxative   Post Anesthesia Home Care Instructions  Activity: Get plenty of rest for the remainder of the day. A responsible individual must stay with you for 24 hours following the procedure.  For the next 24 hours, DO NOT: -Drive a car -Advertising copywriter -Drink alcoholic beverages -Take any medication unless instructed by your physician -Make any legal decisions or sign important papers.  Meals: Start with liquid foods such as gelatin or soup. Progress to regular foods as tolerated. Avoid greasy, spicy, heavy foods. If nausea and/or vomiting occur, drink only clear liquids until the nausea and/or vomiting subsides. Call your physician if vomiting continues.  Special Instructions/Symptoms: Your throat may feel dry or sore from the anesthesia or the breathing tube placed in your throat during surgery. If this causes discomfort, gargle with warm salt water. The discomfort should disappear within 24 hours.

## 2023-09-19 NOTE — Interval H&P Note (Signed)
 History and Physical Interval Note:  09/19/2023 7:12 AM  [name] [name]  has presented today for surgery, with the diagnosis of Right carpal tunnel syndrome.  The various methods of treatment have been discussed with the patient and family. After consideration of risks, benefits and other options for treatment, the patient has consented to  Procedure(s): CARPAL TUNNEL RELEASE (Right) as a surgical intervention.  The patient's history has been reviewed, patient examined, no change in status, stable for surgery.  I have reviewed the patient's chart and labs.  Questions were answered to the patient's satisfaction.     Tonita Frater

## 2023-09-20 ENCOUNTER — Encounter (HOSPITAL_COMMUNITY): Payer: Self-pay | Admitting: Orthopedic Surgery

## 2023-09-25 ENCOUNTER — Telehealth (INDEPENDENT_AMBULATORY_CARE_PROVIDER_SITE_OTHER): Admitting: Internal Medicine

## 2023-09-25 ENCOUNTER — Encounter: Payer: Self-pay | Admitting: Internal Medicine

## 2023-09-25 ENCOUNTER — Ambulatory Visit: Payer: Self-pay | Admitting: *Deleted

## 2023-09-25 ENCOUNTER — Ambulatory Visit: Payer: Self-pay

## 2023-09-25 DIAGNOSIS — K047 Periapical abscess without sinus: Secondary | ICD-10-CM | POA: Diagnosis not present

## 2023-09-25 DIAGNOSIS — T7840XA Allergy, unspecified, initial encounter: Secondary | ICD-10-CM

## 2023-09-25 MED ORDER — AMOXICILLIN-POT CLAVULANATE 875-125 MG PO TABS: 1.0000 | ORAL_TABLET | Freq: Two times a day (BID) | ORAL | 0 refills | Status: DC

## 2023-09-25 NOTE — Telephone Encounter (Signed)
 Message from Wilsey B sent at 09/25/2023 12:54 PM EDT  Copied From CRM [id number]. Reason for Triage: Reason for Triage: patient went to the dentist 09/24/23 and was prescribed an rx she thinks that the rx gave her a blister that she has on her hand and she wants to know what she needs to do going forward as fa as the blister is concern please call patient back [phone]    Call History  Contact Date/Time Type Contact Phone/Fax By  09/25/2023 12:48 PM EDT Phone (Incoming) [name] [name], [name] [name] [place] [phone] [facility]

## 2023-09-25 NOTE — Progress Notes (Signed)
 Virtual Visit via Video Note   Because of IMA LAPIANA co-morbid illnesses, she is at least at moderate risk for complications without adequate follow up.  This format is felt to be most appropriate for this patient at this time.  All issues noted in this document were discussed and addressed.  A limited physical exam was performed with this format.      Evaluation Performed:  Follow-up visit  Date:  09/25/2023   ID:  [name] [name], [name] [name] 1965/06/07, MRN [id number]  Patient Location: Home Provider Location: Home Office  Participants: Patient Location of Patient: Home Location of Provider: Telehealth Consent was obtain for visit to be over via telehealth. I verified that I am speaking with the correct person using two identifiers.  PCP:  Meldon Sport, MD   Chief Complaint: Dental abscess, allergic reaction to clindamycin  History of Present Illness:    [name] [name] is a 58 y.o. female who has a video visit for complaint of dental abscess.  She was evaluated by dentist yesterday, was given clindamycin, but had skin reaction from it.  She has a blister on her finger and rash near her breast since yesterday.  She has mild itching around the breast area.  She has stopped taking clindamycin now.  Denies any fever or chills, but has dental pain currently.  Denies lip swelling, dyspnea or wheezing.  The patient does not have symptoms concerning for COVID-19 infection (fever, chills, cough, or new shortness of breath).   Past Medical, Surgical, Social History, Allergies, and Medications have been Reviewed.  Past Medical History:  Diagnosis Date   Allergy    Phreesia 09/19/2020   Anxiety    Bradycardia 03/02/2015   GERD (gastroesophageal reflux disease)    History of hysterectomy    History of oophorectomy    Hx of cholecystectomy    Hypertension    IDA (iron deficiency anemia)    Migraines    Near syncope 03/02/2015   Pre-diabetes    Sciatica neuralgia, right    Sleep  apnea    Stroke (HCC)    "light stroke 08/2015. No deficits   Tobacco abuse 03/03/2015   Tobacco abuse 03/03/2015   Vertigo    Past Surgical History:  Procedure Laterality Date   ABDOMINAL HYSTERECTOMY     BIOPSY  12/04/2016   Procedure: BIOPSY;  Surgeon: Alyce Jubilee, MD;  Location: [facility];  Service: Endoscopy;;  gastric and duodenal biopsy   BIOPSY  07/30/2017   Procedure: BIOPSY;  Surgeon: Alyce Jubilee, MD;  [facility]: [facility];  Service: Endoscopy;;  random colon bx's   CARPAL TUNNEL RELEASE Right 09/19/2023   Procedure: CARPAL TUNNEL RELEASE;  Surgeon: Tonita Frater, MD;  Location: [facility];  Service: Orthopedics;  Laterality: Right;   CATARACT EXTRACTION W/PHACO Left 05/19/2021   Procedure: CATARACT EXTRACTION PHACO AND INTRAOCULAR LENS PLACEMENT (IOC);  Surgeon: Tarri Farm, MD;  Location: [facility];  Service: Ophthalmology;  Laterality: Left;  CDE: 3.35   CATARACT EXTRACTION W/PHACO Right 06/09/2021   Procedure: CATARACT EXTRACTION PHACO AND INTRAOCULAR LENS PLACEMENT (IOC);  Surgeon: Tarri Farm, MD;  Location: [facility];  Service: Ophthalmology;  Laterality: Right;  CDE: 5.99   CESAREAN SECTION     x2   CESAREAN SECTION N/A    Phreesia 09/19/2020   CHOLECYSTECTOMY     COLONOSCOPY WITH ESOPHAGOGASTRODUODENOSCOPY (EGD)  2007   Dr. Grandville Lax: normal   COLONOSCOPY WITH PROPOFOL  N/A 07/30/2017  Dr. Nolene Baumgarten: Redundant colon, external/internal hemorrhoids. Colon otherwise normal.  Random colon biopsies negative.   ESOPHAGOGASTRODUODENOSCOPY (EGD) WITH PROPOFOL  N/A 12/04/2016   Dr. Nolene Baumgarten: Gastritis but no H.pylori, small bowel bx negative. gastric polyps benign fundic gland   HEMORRHOID SURGERY     TOTAL SHOULDER ARTHROPLASTY Left 01/10/2023   Procedure: TOTAL SHOULDER ARTHROPLASTY;  Surgeon: Tonita Frater, MD;  Location: [facility];  Service: Orthopedics;  Laterality: Left;     Current Meds  Medication Sig   ADDERALL XR 30 MG 24 hr capsule Take 30 mg by mouth daily.    albuterol  (VENTOLIN  HFA) 108 (90 Base) MCG/ACT inhaler TAKE 2 PUFFS BY MOUTH EVERY 6 HOURS AS NEEDED FOR WHEEZE OR SHORTNESS OF BREATH   ALPRAZolam  (XANAX ) 1 MG tablet Take 1 mg by mouth 4 (four) times daily as needed for anxiety.   amLODipine  (NORVASC ) 10 MG tablet Take 1 tablet (10 mg total) by mouth daily.   amoxicillin-clavulanate (AUGMENTIN) 875-125 MG tablet Take 1 tablet by mouth 2 (two) times daily.   amphetamine-dextroamphetamine (ADDERALL) 30 MG tablet Take 30 mg by mouth in the morning.   cetirizine  (ZYRTEC ) 10 MG tablet Take 1 tablet (10 mg total) by mouth daily. (Patient taking differently: Take 10 mg by mouth daily as needed for allergies.)   Coenzyme Q10 (CO Q-10 PO) Take 1 capsule by mouth in the morning.   diclofenac  (VOLTAREN ) 75 MG EC tablet Take 1 tablet (75 mg total) by mouth 2 (two) times daily as needed for mild pain (pain score 1-3) or moderate pain (pain score 4-6). (Patient taking differently: Take 75 mg by mouth 2 (two) times daily.)   fluticasone  (FLONASE ) 50 MCG/ACT nasal spray Place 1 spray into both nostrils 2 (two) times daily. (Patient taking differently: Place 1 spray into both nostrils 2 (two) times daily as needed for allergies.)   gabapentin  (NEURONTIN ) 100 MG capsule Take 1 capsule (100 mg total) by mouth 3 (three) times daily.   HYDROcodone -acetaminophen  (NORCO/VICODIN) 5-325 MG tablet Take 1 tablet by mouth every 6 (six) hours as needed for up to 7 days for moderate pain (pain score 4-6).   lidocaine  (LIDODERM ) 5 % Place 1 patch onto the skin daily. Remove & Discard patch within 12 hours or as directed by MD (Patient taking differently: Place 1 patch onto the skin daily as needed (pain.). Remove & Discard patch within 12 hours or as directed by MD)   Misc. Devices MISC Blood pressure cuff/device - 1. ICD10: I10   ondansetron  (ZOFRAN ) 4 MG tablet Take 1 tablet (4 mg total) by mouth every 8 (eight) hours as needed for up to 14 days for nausea or vomiting.    pantoprazole  (PROTONIX ) 40 MG tablet TAKE (1) TABLET BY MOUTH ONCE DAILY.   potassium chloride  SA (KLOR-CON  M) 20 MEQ tablet Take 1 tablet (20 mEq total) by mouth daily.   pravastatin  (PRAVACHOL ) 10 MG tablet TAKE ONE TABLET BY MOUTH EVERY DAY   spironolactone  (ALDACTONE ) 25 MG tablet Take 1 tablet (25 mg total) by mouth daily.   tirzepatide (ZEPBOUND) 2.5 MG/0.5ML injection vial Inject 2.5 mg into the skin once a week.   Vitamin D , Ergocalciferol , (DRISDOL ) 1.25 MG (50000 UNIT) CAPS capsule Take 1 capsule (50,000 Units total) by mouth every 7 (seven) days. (Patient taking differently: Take 50,000 Units by mouth every Wednesday.)     Allergies:   Clindamycin/lincomycin and Sulfa antibiotics   ROS:   Please see the history of present illness. All other systems reviewed and  are negative.   Labs/Other Tests and Data Reviewed:    Recent Labs: 01/03/2023: Magnesium  1.2 04/05/2023: ALT 10; TSH 0.893 09/17/2023: BUN 14; Creatinine, Ser 1.10; Hemoglobin 11.9; Platelets 217; Potassium 3.7; Sodium 136   Recent Lipid Panel Lab Results  Component Value Date/Time   CHOL 177 04/05/2023 09:22 AM   TRIG 58 04/05/2023 09:22 AM   HDL 61 04/05/2023 09:22 AM   CHOLHDL 2.9 04/05/2023 09:22 AM   LDLCALC 105 (H) 04/05/2023 09:22 AM    Wt Readings from Last 3 Encounters:  09/17/23 175 lb 0.7 oz (79.4 kg)  06/12/23 175 lb (79.4 kg)  04/05/23 182 lb 6.4 oz (82.7 kg)     Objective:    Vital Signs:  LMP 05/21/1997    VITAL SIGNS:  reviewed GEN:  no acute distress EYES:  sclerae anicteric, EOMI - Extraocular Movements Intact RESPIRATORY:  normal respiratory effort, symmetric expansion NEURO:  alert and oriented x 3, no obvious focal deficit PSYCH:  normal affect   ASSESSMENT & PLAN:    Allergic reaction to clindamycin Dental abscess Advised to take Benadryl  or Zyrtec  as needed for allergic reaction Stop clindamycin, switch to Augmentin for dental abscess Follow-up with dentist as  scheduled   I discussed the assessment and treatment plan with the patient. The patient was provided an opportunity to ask questions, and all were answered. The patient agreed with the plan and demonstrated an understanding of the instructions.   The patient was advised to call back or seek an in-person evaluation if the symptoms worsen or if the condition fails to improve as anticipated.  The above assessment and management plan was discussed with the patient. The patient verbalized understanding of and has agreed to the management plan.   Medication Adjustments/Labs and Tests Ordered: Current medicines are reviewed at length with the patient today.  Concerns regarding medicines are outlined above.   Tests Ordered: No orders of the defined types were placed in this encounter.   Medication Changes: Meds ordered this encounter  Medications   amoxicillin-clavulanate (AUGMENTIN) 875-125 MG tablet    Sig: Take 1 tablet by mouth 2 (two) times daily.    Dispense:  14 tablet    Refill:  0     Note: This dictation was prepared with Dragon dictation along with smaller phrase technology. Similar sounding words can be transcribed inadequately or may not be corrected upon review. Any transcriptional errors that result from this process are unintentional.      Disposition:  Follow up  Signed, Meldon Sport, MD  09/25/2023 4:04 PM     [facility]

## 2023-09-25 NOTE — Telephone Encounter (Signed)
 Copied from CRM [id number]. Topic: Clinical - Pink Word Triage >> Sep 25, 2023 12:53 PM Geneva B wrote: Reason for Triage: Reason for Triage: patient went to the dentist 09/24/23 and was prescribed an rx she thinks that the rx gave her a blister that she has on her hand and she wants to know what she needs to do going forward as fa as the blister is concern please call patient back [phone]  Patient hung up and did not answer phone during call back.

## 2023-09-25 NOTE — Telephone Encounter (Signed)
 First attempt to return her call.   "Your call cannot be completed at this time"  "Please try your call again later".    Will attempt again later.

## 2023-09-25 NOTE — Patient Instructions (Signed)
 Please start taking Augmentin instead of clindamycin.  Please take Benadryl  or Zyrtec  as needed for itching/allergic reaction.

## 2023-09-25 NOTE — Telephone Encounter (Signed)
 Third attempt to return her call.   Left a voicemail to call back.  Per policy I'm forwarding this to [facility] after 3 unsuccessful attempts at returning her call.

## 2023-09-25 NOTE — Telephone Encounter (Signed)
2nd attempt to return her call.   Left a voicemail to call back to discuss symptoms with a nurse.

## 2023-09-26 NOTE — Telephone Encounter (Signed)
 Pt had a video visit with Dr. Lydia Sams on 09/26/23 to address her concern.

## 2023-09-26 NOTE — Telephone Encounter (Signed)
 Copied from CRM [id number]. Topic: General - Other >> Sep 25, 2023  3:47 PM Felizardo Hotter wrote: Reason for CRM: Pt returning call regarding medication reaction. Please call pt at [phone]

## 2023-09-27 ENCOUNTER — Ambulatory Visit (INDEPENDENT_AMBULATORY_CARE_PROVIDER_SITE_OTHER): Admitting: Orthopedic Surgery

## 2023-09-27 ENCOUNTER — Other Ambulatory Visit: Payer: Self-pay

## 2023-09-27 ENCOUNTER — Encounter: Payer: Self-pay | Admitting: Orthopedic Surgery

## 2023-09-27 DIAGNOSIS — G5601 Carpal tunnel syndrome, right upper limb: Secondary | ICD-10-CM

## 2023-09-27 DIAGNOSIS — E782 Mixed hyperlipidemia: Secondary | ICD-10-CM

## 2023-09-27 MED ORDER — PRAVASTATIN SODIUM 10 MG PO TABS: 10.0000 mg | ORAL_TABLET | Freq: Every day | ORAL | 1 refills | Status: DC

## 2023-09-27 NOTE — Progress Notes (Signed)
 Orthopaedic Postop Note  Assessment: [name] [name] is a 58 y.o. female s/p right open carpal tunnel release  DOS: 09/19/2023  Plan: [name] [name] has done well.  Notes full resolution of symptoms.  Denies burning or shooting pains.  Some persistent tingling sensations in the median nerve distribution.  Surgical incision is healing well.  Sutures were removed, and Steri-Strips were placed.  Okay to return to work.  Keep incision covered.  Do not submerge the wound.  Return in 4 weeks.    Follow-up: Return in about 4 weeks (around 10/25/2023). XR at next visit: None  Subjective:  Chief Complaint  Patient presents with   Routine Post Op    R CTR DOS 09/19/23    History of Present Illness: [name] [name] is a 58 y.o. female who presents following the above stated procedure.  Surgery was approximately 2 weeks ago.  No issues.  Some pain for the first few days.  Burning and shooting pains have completely resolved.  She reports no numbness in the fingers.   No issues with the incision  Review of Systems: No fevers or chills No numbness or tingling No Chest Pain No shortness of breath   Objective: LMP 05/21/1997   Physical Exam:  Alert and oriented, no acute distress.  Surgical incision is healing well.  No surrounding erythema or drainage.  Mild tenderness to palpation about surgical site.  Sensation is intact in the median nerve distribution.  Able to make a full fist. No redness. 2+ radial pulse.   IMAGING: I personally ordered and reviewed the following images:  No new imaging obtained today  Tonita Frater, MD 09/27/2023 11:51 AM

## 2023-10-10 ENCOUNTER — Encounter: Payer: Self-pay | Admitting: Internal Medicine

## 2023-10-10 ENCOUNTER — Ambulatory Visit (INDEPENDENT_AMBULATORY_CARE_PROVIDER_SITE_OTHER): Payer: 59 | Admitting: Internal Medicine

## 2023-10-10 VITALS — BP 138/85 | HR 89 | Ht 62.0 in | Wt 178.2 lb

## 2023-10-10 DIAGNOSIS — G4733 Obstructive sleep apnea (adult) (pediatric): Secondary | ICD-10-CM

## 2023-10-10 DIAGNOSIS — F900 Attention-deficit hyperactivity disorder, predominantly inattentive type: Secondary | ICD-10-CM | POA: Diagnosis not present

## 2023-10-10 DIAGNOSIS — M67472 Ganglion, left ankle and foot: Secondary | ICD-10-CM | POA: Insufficient documentation

## 2023-10-10 DIAGNOSIS — K219 Gastro-esophageal reflux disease without esophagitis: Secondary | ICD-10-CM

## 2023-10-10 DIAGNOSIS — I10 Essential (primary) hypertension: Secondary | ICD-10-CM

## 2023-10-10 DIAGNOSIS — E538 Deficiency of other specified B group vitamins: Secondary | ICD-10-CM | POA: Diagnosis not present

## 2023-10-10 DIAGNOSIS — E782 Mixed hyperlipidemia: Secondary | ICD-10-CM

## 2023-10-10 DIAGNOSIS — R7303 Prediabetes: Secondary | ICD-10-CM

## 2023-10-10 MED ORDER — CYANOCOBALAMIN 1000 MCG/ML IJ SOLN
1000.0000 ug | Freq: Once | INTRAMUSCULAR | Status: AC
  Administered 2023-10-10: 1000 ug via INTRAMUSCULAR

## 2023-10-10 NOTE — Assessment & Plan Note (Signed)
On Adderall Follows up with Psychiatrist 

## 2023-10-10 NOTE — Assessment & Plan Note (Addendum)
 BP Readings from Last 1 Encounters:  10/10/23 138/85   Uncontrolled with spironolactone  25 mg once daily Advised to start amlodipine  at 5 mg once daily dose for now (half tablet of 10 mg) Had started spironolactone  for HTN, leg swelling and can also help with hypokalemia Counseled for compliance with the medications Advised DASH diet and moderate exercise/walking, at least 150 mins/week

## 2023-10-10 NOTE — Progress Notes (Signed)
 Established Patient Office Visit  Subjective:  Patient ID: [name] [name], female    DOB: 02-Mar-1966  Age: 58 y.o. MRN: [id number]  CC:  Chief Complaint  Patient presents with   Hypertension    Four month follow    Gastroesophageal Reflux    Four month follow up    Cyst    Knot on left big toe    HPI [name] [name] is a 58 y.o. female with past medical history of HTN,  GERD/gastritis, ADD with anxiety and obesity who presents for f/u of her chronic medical conditions.  HTN: BP is elevated today. Takes spironolactone  25 mg QD regularly, but has stopped taking amlodipine  10 mg QD. Patient denies headache, dizziness, chest pain, dyspnea or palpitations.  Hypokalemia: She takes potassium supplement-20 mEq once daily currently and spironolactone  25 mg QD.  GAD and ADD: She has history of GAD and ADD, followed by psychiatry.  She has been more stressed recently due to loss of her job.  She is currently taking Xanax  as needed for anxiety. She is also on Adderall for ADD. Her BP is WNL. She reports snoring at nighttime. Had sleep study, showed mild OSA.  She reports diffuse myalgias, and attributes it to her weight gain.  She has been trying to follow low-carb diet. She is motivated to follow low carb diet and increase physical activity as tolerated.  She had lost about 7 lbs till the last visit with diet modification and moderate exercise.  She was not able to get Zepbound due to insurance coverage concern.  She reports having a knot on the left great toe, which is chronic.  Denies any recent change in size.  Denies any local pain or swelling currently.  Past Medical History:  Diagnosis Date   Allergy    Phreesia 09/19/2020   Anxiety    Bradycardia 03/02/2015   GERD (gastroesophageal reflux disease)    History of hysterectomy    History of oophorectomy    Hx of cholecystectomy    Hypertension    IDA (iron deficiency anemia)    Migraines    Near syncope 03/02/2015   Pre-diabetes     Sciatica neuralgia, right    Sleep apnea    Stroke (HCC)    "light stroke 08/2015. No deficits   Tobacco abuse 03/03/2015   Tobacco abuse 03/03/2015   Vertigo     Past Surgical History:  Procedure Laterality Date   ABDOMINAL HYSTERECTOMY     BIOPSY  12/04/2016   Procedure: BIOPSY;  Surgeon: Alyce Jubilee, MD;  Location: [facility];  Service: Endoscopy;;  gastric and duodenal biopsy   BIOPSY  07/30/2017   Procedure: BIOPSY;  Surgeon: Alyce Jubilee, MD;  Location: [facility];  Service: Endoscopy;;  random colon bx's   CARPAL TUNNEL RELEASE Right 09/19/2023   Procedure: CARPAL TUNNEL RELEASE;  Surgeon: Tonita Frater, MD;  Location: [facility];  Service: Orthopedics;  Laterality: Right;   CATARACT EXTRACTION W/PHACO Left 05/19/2021   Procedure: CATARACT EXTRACTION PHACO AND INTRAOCULAR LENS PLACEMENT (IOC);  Surgeon: Tarri Farm, MD;  Location: [facility];  Service: Ophthalmology;  Laterality: Left;  CDE: 3.35   CATARACT EXTRACTION W/PHACO Right 06/09/2021   Procedure: CATARACT EXTRACTION PHACO AND INTRAOCULAR LENS PLACEMENT (IOC);  Surgeon: Tarri Farm, MD;  Location: [facility];  Service: Ophthalmology;  Laterality: Right;  CDE: 5.99   CESAREAN SECTION     x2   CESAREAN SECTION N/A    Phreesia 09/19/2020  CHOLECYSTECTOMY     COLONOSCOPY WITH ESOPHAGOGASTRODUODENOSCOPY (EGD)  2007   Dr. Grandville Lax: normal   COLONOSCOPY WITH PROPOFOL  N/A 07/30/2017   Dr. Nolene Baumgarten: Redundant colon, external/internal hemorrhoids. Colon otherwise normal.  Random colon biopsies negative.   ESOPHAGOGASTRODUODENOSCOPY (EGD) WITH PROPOFOL  N/A 12/04/2016   Dr. Nolene Baumgarten: Gastritis but no H.pylori, small bowel bx negative. gastric polyps benign fundic gland   HEMORRHOID SURGERY     TOTAL SHOULDER ARTHROPLASTY Left 01/10/2023   Procedure: TOTAL SHOULDER ARTHROPLASTY;  Surgeon: Tonita Frater, MD;  Location: [facility];  Service: Orthopedics;  Laterality: Left;    Family History  Problem Relation Age of Onset   ALS  Mother    Cardiomyopathy Father    Colon cancer Neg Hx     Social History   Socioeconomic History   Marital status: Married    Spouse name: Not on file   Number of children: Not on file   Years of education: Not on file   Highest education level: Not on file  Occupational History   Not on file  Tobacco Use   Smoking status: Former    Current packs/day: 0.00    Average packs/day: 0.1 packs/day for 20.0 years (2.0 ttl pk-yrs)    Types: Cigarettes    Start date: 02/23/2001    Quit date: 02/23/2021    Years since quitting: 2.6   Smokeless tobacco: Never  Vaping Use   Vaping status: Never Used  Substance and Sexual Activity   Alcohol use: Not Currently    Comment: seldom   Drug use: Yes    Frequency: 2.0 times per week    Types: Marijuana    Comment: occ   Sexual activity: Yes    Birth control/protection: Surgical  Other Topics Concern   Not on file  Social History Narrative   Drinks 5-6 cups of coffee daily.   Social Drivers of Corporate investment banker Strain: Not on file  Food Insecurity: Not on file  Transportation Needs: Not on file  Physical Activity: Not on file  Stress: Not on file  Social Connections: Not on file  Intimate Partner Violence: Not on file    Outpatient Medications Prior to Visit  Medication Sig Dispense Refill   ADDERALL XR 30 MG 24 hr capsule Take 30 mg by mouth daily.     albuterol  (VENTOLIN  HFA) 108 (90 Base) MCG/ACT inhaler TAKE 2 PUFFS BY MOUTH EVERY 6 HOURS AS NEEDED FOR WHEEZE OR SHORTNESS OF BREATH 8 g 5   ALPRAZolam  (XANAX ) 1 MG tablet Take 1 mg by mouth 4 (four) times daily as needed for anxiety.     amphetamine-dextroamphetamine (ADDERALL) 30 MG tablet Take 30 mg by mouth in the morning.     cetirizine  (ZYRTEC ) 10 MG tablet Take 1 tablet (10 mg total) by mouth daily. (Patient taking differently: Take 10 mg by mouth daily as needed for allergies.) 90 tablet 3   Coenzyme Q10 (CO Q-10 PO) Take 1 capsule by mouth in the morning.      diclofenac  (VOLTAREN ) 75 MG EC tablet Take 1 tablet (75 mg total) by mouth 2 (two) times daily as needed for mild pain (pain score 1-3) or moderate pain (pain score 4-6). (Patient taking differently: Take 75 mg by mouth 2 (two) times daily.) 30 tablet 0   fluticasone  (FLONASE ) 50 MCG/ACT nasal spray Place 1 spray into both nostrils 2 (two) times daily. (Patient taking differently: Place 1 spray into both nostrils 2 (two) times daily as needed for allergies.)  48 mL 1   gabapentin  (NEURONTIN ) 100 MG capsule Take 1 capsule (100 mg total) by mouth 3 (three) times daily. 90 capsule 0   lidocaine  (LIDODERM ) 5 % Place 1 patch onto the skin daily. Remove & Discard patch within 12 hours or as directed by MD (Patient taking differently: Place 1 patch onto the skin daily as needed (pain.). Remove & Discard patch within 12 hours or as directed by MD) 30 patch 0   Misc. Devices MISC Blood pressure cuff/device - 1. ICD10: I10 1 each 0   pantoprazole  (PROTONIX ) 40 MG tablet TAKE (1) TABLET BY MOUTH ONCE DAILY. 90 tablet 0   potassium chloride  SA (KLOR-CON  M) 20 MEQ tablet Take 1 tablet (20 mEq total) by mouth daily. 30 tablet 3   pravastatin  (PRAVACHOL ) 10 MG tablet Take 1 tablet (10 mg total) by mouth daily. 30 tablet 1   spironolactone  (ALDACTONE ) 25 MG tablet Take 1 tablet (25 mg total) by mouth daily. 90 tablet 1   Vitamin D , Ergocalciferol , (DRISDOL ) 1.25 MG (50000 UNIT) CAPS capsule Take 1 capsule (50,000 Units total) by mouth every 7 (seven) days. (Patient taking differently: Take 50,000 Units by mouth every Wednesday.) 12 capsule 1   amLODipine  (NORVASC ) 10 MG tablet Take 1 tablet (10 mg total) by mouth daily. (Patient not taking: Reported on 10/10/2023) 90 tablet 1   amoxicillin -clavulanate (AUGMENTIN ) 875-125 MG tablet Take 1 tablet by mouth 2 (two) times daily. 14 tablet 0   tirzepatide (ZEPBOUND) 2.5 MG/0.5ML injection vial Inject 2.5 mg into the skin once a week. 2 mL 0   No facility-administered  medications prior to visit.    Allergies  Allergen Reactions   Clindamycin/Lincomycin Swelling   Sulfa Antibiotics Hives and Nausea And Vomiting    ROS Review of Systems  Constitutional:  Negative for chills and fever.  HENT:  Negative for congestion, sinus pressure, sinus pain and sore throat.   Eyes:  Negative for pain and discharge.  Respiratory:  Negative for cough and shortness of breath.   Cardiovascular:  Negative for chest pain and palpitations.  Gastrointestinal:  Negative for abdominal pain, diarrhea, nausea and vomiting.  Endocrine: Negative for polydipsia and polyuria.  Genitourinary:  Negative for dysuria and hematuria.  Musculoskeletal:  Positive for arthralgias and back pain. Negative for neck pain and neck stiffness.  Skin:  Negative for rash.  Neurological:  Negative for dizziness and weakness.  Psychiatric/Behavioral:  Negative for agitation and behavioral problems. The patient is nervous/anxious.       Objective:    Physical Exam Vitals reviewed.  Constitutional:      General: She is not in acute distress.    Appearance: She is obese. She is not diaphoretic.  HENT:     Head: Normocephalic and atraumatic.     Nose: Nose normal.     Mouth/Throat:     Mouth: Mucous membranes are moist.  Eyes:     General: No scleral icterus.    Extraocular Movements: Extraocular movements intact.  Cardiovascular:     Rate and Rhythm: Normal rate and regular rhythm.     Heart sounds: Normal heart sounds. No murmur heard. Pulmonary:     Breath sounds: Normal breath sounds. No wheezing or rales.  Musculoskeletal:     Cervical back: Neck supple. No tenderness.     Lumbar back: Tenderness (Right paraspinal) present. Positive right straight leg raise test. Negative left straight leg raise test.     Right lower leg: No edema.  Left lower leg: No edema.  Skin:    General: Skin is warm.     Findings: Rash (Lichenied skin around neck area) present.     Comments: About 1  cm in diameter cyst over left great toe  Neurological:     General: No focal deficit present.     Mental Status: She is alert and oriented to person, place, and time.     Sensory: No sensory deficit.     Motor: No weakness.  Psychiatric:        Mood and Affect: Mood normal.        Behavior: Behavior normal.     BP 138/85   Pulse 89   Ht 5\' 2"  (1.575 m)   Wt 178 lb 3.2 oz (80.8 kg)   LMP 05/21/1997   SpO2 96%   BMI 32.59 kg/m  Wt Readings from Last 3 Encounters:  10/10/23 178 lb 3.2 oz (80.8 kg)  09/17/23 175 lb 0.7 oz (79.4 kg)  06/12/23 175 lb (79.4 kg)    Lab Results  Component Value Date   TSH 0.893 04/05/2023   Lab Results  Component Value Date   WBC 5.2 09/17/2023   HGB 11.9 (L) 09/17/2023   HCT 37.2 09/17/2023   MCV 91.9 09/17/2023   PLT 217 09/17/2023   Lab Results  Component Value Date   NA 136 09/17/2023   K 3.7 09/17/2023   CO2 23 09/17/2023   GLUCOSE 141 (H) 09/17/2023   BUN 14 09/17/2023   CREATININE 1.10 (H) 09/17/2023   BILITOT 0.2 04/05/2023   ALKPHOS 112 04/05/2023   AST 19 04/05/2023   ALT 10 04/05/2023   PROT 7.3 04/05/2023   ALBUMIN 4.6 04/05/2023   CALCIUM  9.4 09/17/2023   ANIONGAP 10 09/17/2023   EGFR 71 04/05/2023   Lab Results  Component Value Date   CHOL 177 04/05/2023   Lab Results  Component Value Date   HDL 61 04/05/2023   Lab Results  Component Value Date   LDLCALC 105 (H) 04/05/2023   Lab Results  Component Value Date   TRIG 58 04/05/2023   Lab Results  Component Value Date   CHOLHDL 2.9 04/05/2023   Lab Results  Component Value Date   HGBA1C 5.4 09/17/2023      Assessment & Plan:   Problem List Items Addressed This Visit       Cardiovascular and Mediastinum   Essential hypertension - Primary (Chronic)   BP Readings from Last 1 Encounters:  10/10/23 138/85   Uncontrolled with spironolactone  25 mg once daily Advised to start amlodipine  at 5 mg once daily dose for now (half tablet of 10 mg) Had  started spironolactone  for HTN, leg swelling and can also help with hypokalemia Counseled for compliance with the medications Advised DASH diet and moderate exercise/walking, at least 150 mins/week      Relevant Medications   amLODipine  (NORVASC ) 10 MG tablet     Respiratory   OSA (obstructive sleep apnea)   Recently had home sleep study, which showed mild OSA.  Considering her symptoms, she would benefit from CPAP treatment.  She is agreeable with the plan.  CPAP order placed in the last visit        Digestive   GERD (gastroesophageal reflux disease)   H/o gastritis On Pantoprazole  40 mg QD        Other   Mixed hyperlipidemia (Chronic)   Check lipid profile Needs to follow low cholesterol diet- DASH diet material provided Has  tried Crestor  and Lipitor - had myalgias Tolerates pravastatin  10 mg QD If she has leg cramps with it, she can be deemed statin intolerant Advised to take co-Q10 for leg cramps      Relevant Medications   amLODipine  (NORVASC ) 10 MG tablet   ADD (attention deficit disorder)   On Adderall Follows up with Psychiatrist      Prediabetes   Lab Results  Component Value Date   HGBA1C 5.4 09/17/2023   Advised to follow DASH diet for now      Digital mucinous cyst of toe of left foot   Cyst over left great toe overall benign If increases in size or becomes painful/inflamed, will refer for I&D      Other Visit Diagnoses       Vitamin B 12 deficiency       Relevant Medications   cyanocobalamin  (VITAMIN B12) injection 1,000 mcg (Completed)        Meds ordered this encounter  Medications   cyanocobalamin  (VITAMIN B12) injection 1,000 mcg   amLODipine  (NORVASC ) 10 MG tablet    Sig: Take 0.5 tablets (5 mg total) by mouth daily.    Follow-up: Return in about 1 month (around 11/10/2023) for Weight management.    Meldon Sport, MD

## 2023-10-10 NOTE — Patient Instructions (Signed)
 Please start taking Amlodipine  half tablet once daily.  Please continue taking Spironolactone  regularly.  Please continue to take medications as prescribed.  Please continue to follow low carb diet and perform moderate exercise/walking at least 150 mins/week.

## 2023-10-10 NOTE — Assessment & Plan Note (Signed)
 Check lipid profile Needs to follow low cholesterol diet- DASH diet material provided Has tried Crestor and Lipitor - had myalgias Tolerates pravastatin 10 mg QD If she has leg cramps with it, she can be deemed statin intolerant Advised to take co-Q10 for leg cramps

## 2023-10-10 NOTE — Assessment & Plan Note (Signed)
H/o gastritis On Pantoprazole 40 mg QD

## 2023-10-11 ENCOUNTER — Encounter: Payer: Self-pay | Admitting: Orthopedic Surgery

## 2023-10-11 ENCOUNTER — Ambulatory Visit (INDEPENDENT_AMBULATORY_CARE_PROVIDER_SITE_OTHER): Admitting: Orthopedic Surgery

## 2023-10-11 DIAGNOSIS — G5601 Carpal tunnel syndrome, right upper limb: Secondary | ICD-10-CM

## 2023-10-11 MED ORDER — AMLODIPINE BESYLATE 10 MG PO TABS: 5.0000 mg | ORAL_TABLET | Freq: Every day | ORAL | Status: DC

## 2023-10-11 NOTE — Assessment & Plan Note (Signed)
 Lab Results  Component Value Date   HGBA1C 5.4 09/17/2023   Advised to follow DASH diet for now

## 2023-10-11 NOTE — Progress Notes (Signed)
 Orthopaedic Postop Note  Assessment: [name] [name] is a 58 y.o. female s/p right open carpal tunnel release  DOS: 09/19/2023  Plan: [name] [name] returns clinic today earlier than expected.  She has some pain proximal to the incision.  There is mild swelling in this area.  Low concern for an infection at this time.  She denies numbness and tingling.  Provided reassurance.  She can use a wrist brace for few days, to prevent overuse.  Otherwise, medications as needed.  If she continues to have issues, we can consider a steroid Dosepak.  We discussed the signs and symptoms of infection, and she will return to clinic if she notices any of these issues.  Otherwise, I will see her at her regular scheduled appointment in approximately 2 weeks.  Follow-up: Return in about 2 weeks (around 10/25/2023). XR at next visit: None  Subjective:  No chief complaint on file.   History of Present Illness: [name] [name] is a 58 y.o. female who presents following the above stated procedure.  Surgery was approximately 4 weeks ago.  She was doing very well, but earlier this week, she started to have some pain proximal to the incision.  She noticed some swelling in this area.  She denies fevers or chills.  No drainage.  She is not taking any medicines for this.  She continues to feel very good following surgery, without numbness or tingling.   Review of Systems: No fevers or chills No numbness or tingling No Chest Pain No shortness of breath   Objective: LMP 05/21/1997   Physical Exam:  Alert and oriented, no acute distress.  Surgical incision is healing well.  Proximal to the incision, there is a small amount of swelling.  No redness in this area.  No fluctuance. No surrounding erythema or drainage.  Mild tenderness to palpation about surgical site.  Sensation is intact in the median nerve distribution.  Able to make a full fist. No redness. 2+ radial pulse.   IMAGING: I personally ordered and  reviewed the following images:  No new imaging obtained today  Tonita Frater, MD 10/11/2023 10:55 AM

## 2023-10-11 NOTE — Assessment & Plan Note (Addendum)
 Recently had home sleep study, which showed mild OSA.  Considering her symptoms, she would benefit from CPAP treatment.  She is agreeable with the plan.  CPAP order placed in the last visit

## 2023-10-11 NOTE — Assessment & Plan Note (Signed)
 Cyst over left great toe overall benign If increases in size or becomes painful/inflamed, will refer for I&D

## 2023-10-17 ENCOUNTER — Encounter: Payer: Self-pay | Admitting: Orthopedic Surgery

## 2023-10-22 ENCOUNTER — Encounter: Admitting: Orthopedic Surgery

## 2023-10-23 ENCOUNTER — Encounter: Admitting: Orthopedic Surgery

## 2023-10-24 ENCOUNTER — Encounter: Admitting: Orthopedic Surgery

## 2023-10-30 ENCOUNTER — Ambulatory Visit (INDEPENDENT_AMBULATORY_CARE_PROVIDER_SITE_OTHER): Admitting: Orthopedic Surgery

## 2023-10-30 ENCOUNTER — Encounter: Payer: Self-pay | Admitting: Orthopedic Surgery

## 2023-10-30 DIAGNOSIS — G5601 Carpal tunnel syndrome, right upper limb: Secondary | ICD-10-CM

## 2023-10-30 NOTE — Patient Instructions (Signed)
Hand Exercises  Hand exercises can be helpful for almost anyone. These exercises can strengthen the hands, improve flexibility and movement, and increase blood flow to the hands. These results can make work and daily tasks easier. Hand exercises can be especially helpful for people who have joint pain from arthritis or have nerve damage from overuse (carpal tunnel syndrome). These exercises can also help people who have injured a hand.  Exercises Most of these hand exercises are gentle stretching and motion exercises. It is usually safe to do them often throughout the day. Warming up your hands before exercise may help to reduce stiffness. You can do this with gentle massage or by placing your hands in warm water for 10-15 minutes. It is normal to feel some stretching, pulling, tightness, or mild discomfort as you begin new exercises. This will gradually improve. Stop an exercise right away if you feel sudden, severe pain or your pain gets worse. Ask your health care provider which exercises are best for you. Knuckle bend or "claw" fist Stand or sit with your arm, hand, and all five fingers pointed straight up. Make sure to keep your wrist straight during the exercise. Gently bend your fingers down toward your palm until the tips of your fingers are touching the top of your palm. Keep your big knuckle straight and just bend the small knuckles in your fingers. Hold this position for 10 seconds. Straighten (extend) your fingers back to the starting position. Repeat this exercise 5-10 times with each hand. Full finger fist Stand or sit with your arm, hand, and all five fingers pointed straight up. Make sure to keep your wrist straight during the exercise. Gently bend your fingers into your palm until the tips of your fingers are touching the middle of your palm. Hold this position for 10 seconds. Extend your fingers back to the starting position, stretching every joint fully. Repeat this exercise  5-10 times with each hand. Straight fist Stand or sit with your arm, hand, and all five fingers pointed straight up. Make sure to keep your wrist straight during the exercise. Gently bend your fingers at the big knuckle, where your fingers meet your hand, and the middle knuckle. Keep the knuckle at the tips of your fingers straight and try to touch the bottom of your palm. Hold this position for 10 seconds. Extend your fingers back to the starting position, stretching every joint fully. Repeat this exercise 5-10 times with each hand. Tabletop Stand or sit with your arm, hand, and all five fingers pointed straight up. Make sure to keep your wrist straight during the exercise. Gently bend your fingers at the big knuckle, where your fingers meet your hand, as far down as you can while keeping the small knuckles in your fingers straight. Think of forming a tabletop with your fingers. Hold this position for 10 seconds. Extend your fingers back to the starting position, stretching every joint fully. Repeat this exercise 5-10 times with each hand. Finger spread Place your hand flat on a table with your palm facing down. Make sure your wrist stays straight as you do this exercise. Spread your fingers and thumb apart from each other as far as you can until you feel a gentle stretch. Hold this position for 10 seconds. Bring your fingers and thumb tight together again. Hold this position for 10 seconds. Repeat this exercise 5-10 times with each hand. Making circles Stand or sit with your arm, hand, and all five fingers pointed straight up. Make   sure to keep your wrist straight during the exercise. Make a circle by touching the tip of your thumb to the tip of your index finger. Hold for 10 seconds. Then open your hand wide. Repeat this motion with your thumb and each finger on your hand. Repeat this exercise 5-10 times with each hand. Thumb motion Sit with your forearm resting on a table and your wrist  straight. Your thumb should be facing up toward the ceiling. Keep your fingers relaxed as you move your thumb. Lift your thumb up as high as you can toward the ceiling. Hold for 10 seconds. Bend your thumb across your palm as far as you can, reaching the tip of your thumb for the small finger (pinkie) side of your palm. Hold for 10 seconds. Repeat this exercise 5-10 times with each hand. Grip strengthening Hold a stress ball or other soft ball in the middle of your hand. Slowly increase the pressure, squeezing the ball as much as you can without causing pain. Think of bringing the tips of your fingers into the middle of your palm. All of your finger joints should bend when doing this exercise. Hold your squeeze for 10 seconds, then relax. Repeat this exercise 5-10 times with each hand.   Contact a health care provider if: Your hand pain or discomfort gets much worse when you do an exercise. Your hand pain or discomfort does not improve within 2 hours after you exercise. If you have any of these problems, stop doing these exercises right away. Do not do them again unless your health care provider says that you can.    Get help right away if: You develop sudden, severe hand pain or swelling. If this happens, stop doing these exercises right away. Do not do them again unless your health care provider says that you can. This information is not intended to replace advice given to you by your health care provider. Make sure you discuss any questions you have with your health care provider.     Wrist Fracture Rehab Ask your health care provider which exercises are safe for you. Do exercises exactly as told by your health care provider and adjust them as directed. It is normal to feel mild stretching, pulling, tightness, or discomfort as you do these exercises. Stop right away if you feel sudden pain or your pain gets worse. Do not begin these exercises until told by your health care  provider. Stretching and range-of-motion exercises These exercises warm up your muscles and joints and improve the movement and flexibility of your wrist and hand. These exercises also help to relieve pain,numbness, and tingling. Finger flexion and extension Sit or stand with your elbow at your side. Open and stretch your left / right fingers as wide as you can (extension). Hold this position for 10 seconds. Close your left / right fingers into a gentle fist (flexion). Hold this position for 10 seconds. Slowly return to the starting position. Repeat 10 times. Complete this exercise 1-2 times a day. Wrist flexion Bend your left / right elbow to a 90-degree angle (right angle) with your palm facing the floor. Bend your wrist forward so your fingers point toward the floor (flexion). Hold this position for 10 seconds. Slowly return to the starting position. Repeat 10 times. Complete this exercise 1-2 times a day. Wrist extension Bend your left / right elbow to a 90-degree angle (right angle) with your palm facing the floor. Bend your wrist backward so your fingers point toward   the ceiling (extension). Hold this position for 10 seconds. Slowly return to the starting position. Repeat 10 times. Complete this exercise 1-2 times a day. Ulnar deviation Bend your left / right elbow to a 90-degree angle (right angle), and rest your forearm on a table with your palm facing down. Keeping your hand flat on the table, bend your left / right wrist toward your small finger (pinkie). This is ulnar deviation. Hold this position for 10 seconds. Slowly return to the starting position. Repeat 10 times. Complete this exercise 1-2 times a day. Radial deviation Bend your left / right elbow to a 90-degree angle (right angle), and rest your forearm on a table with your palm facing down. Keeping your hand flat on the table, bend your left / right wrist toward your thumb. This is radial deviation. Hold this  position for 10 seconds. Slowly return to the starting position. Repeat 10 times. Complete this exercise 1-2 times a day. Forearm rotation, supination Stand or sit with your left / right elbow bent to a 90-degree angle (right angle) at your side. Position your forearm so that the thumb is facing the ceiling (neutral position). Turn (rotate) your palm up toward the ceiling (supination), stopping when you feel a gentle stretch. Hold this position for 10 seconds. Slowly return to the starting position. Repeat 10 times. Complete this exercise 1-2 times a day. Forearm rotation, pronation Stand or sit with your left / right elbow bent to a 90-degree angle (right angle) at your side. Position your forearm so that the thumb is facing the ceiling (neutral position). Turn (rotate) your palm down toward the floor (pronation), stopping when you feel a gentle stretch. Hold this position for 10 seconds. Slowly return to the starting position. Repeat 10 times. Complete this exercise 1-2 times a day. Wrist flexion stretch  Extend your left / right arm in front of you and turn your palm down toward the floor. If told by your health care provider, bend your left / right arm to a 90-degree angle (right angle) at your side. Using your uninjured hand, gently press over the back of your left / right hand to bend your wrist and fingers toward the floor (flexion). Go as far as you can to feel a stretch without causing pain. Hold this position for 10 seconds. Slowly return to the starting position. Repeat 10 times. Complete this exercise 1-2 times a day. Wrist extension stretch  Extend your left / right arm in front of you and turn your palm up toward the ceiling. If told by your health care provider, bend your left / right arm to a 90-degree angle (right angle) at your side. Using your uninjured hand, gently press over the palm of your left / right hand to bend your wrist and fingers toward the floor (extension).  Go as far as you can to feel a stretch without causing pain. Hold this position for 10 seconds. Slowly return to the starting position. Repeat 10 times. Complete this exercise 1-2 times a day. Forearm rotation stretch, supination Stand or sit with your arms at your sides. Bend your left / right elbow to a 90-degree angle (right angle). Using your uninjured hand, turn your left / right palm up toward the ceiling (assisted supination) until you feel a gentle stretch in the inside of your forearm. Hold this position for 10 seconds. Slowly return to the starting position. Repeat 10 times. Complete this exercise 1-2 times a day. Forearm rotation stretch, pronation Stand or   sit with your arms at your sides. Bend your left / right elbow to a 90-degree angle (right angle). Using your uninjured hand, turn your left / right palm down toward the floor (assisted pronation) until you feel a gentle stretch in the top of your forearm. Hold this position for 10 seconds. Slowly return to the starting position. Repeat 10 times. Complete this exercise 1-2 times a day. Strengthening exercises These exercises build strength and endurance in your wrist and hand. Enduranceis the ability to use your muscles for a long time, even after they get tired. Wrist flexion Sit with your left / right forearm supported on a table. Your elbow should be at waist height. Rest your hand over the edge of the table, palm up. Gently grasp a 5 lb / kg weight (can of soup). Or, hold an exercise band or tube in both hands, keeping your hands at the same level and hip distance apart. There should be slight tension in the exercise band or tube. Without moving your forearm or elbow, slowly bend your wrist up toward the ceiling (wrist flexion). Hold this position for 10 seconds. Slowly return to the starting position. Repeat 10 times. Complete this exercise 1-2 times a day. Wrist extension Sit with your left / right forearm supported  on a table. Your elbow should be at waist height. Rest your hand over the edge of the table, palm down. Gently grasp a 5 lb / kg weight. Or, hold an exercise band or tube in both hands, keeping your hands at the same level and hip distance apart. There should be slight tension in the exercise band or tube. Without moving your forearm or elbow, slowly curl your hand up toward the ceiling (extension). Hold this position for 10 seconds. Slowly return to the starting position. Repeat 10 times. Complete this exercise 1-2 times a day. Forearm rotation, supination  Sit with your left / right forearm supported on a table. Your elbow should be at waist height. Rest your hand over the edge of the table, palm down. Gently grasp a lightweight hammer near the head. As this exercise gets easier for you, try holding the hammer farther down the handle. Without moving your elbow, slowly turn (rotate) your palm up toward the ceiling (supination). Hold this position for 10 seconds. Slowly return to the starting position. Repeat 10 times. Complete this exercise 1-2 times a day. Forearm rotation, pronation  Sit with your left / right forearm supported on a table. Your elbow should be at waist height. Rest your hand over the edge of the table, palm up. Gently grasp a lightweight hammer near the head. As this exercise gets easier for you, try holding the hammer farther down the handle. Without moving your elbow, slowly turn (rotate) your palm down toward the floor (pronation). Hold this position for 10 seconds. Slowly return to the starting position. Repeat 10 times. Complete this exercise 1-2 times a day. Grip strengthening  Hold one of these items in your left / right hand: a dense sponge, a stress ball, or a large, rolled sock. Slowly squeeze the object as hard as you can without increasing any pain. Hold your squeeze for 10 seconds. Slowly release your grip. Repeat 10 times. Complete this exercise 1-2  times a day. This information is not intended to replace advice given to you by your health care provider. Make sure you discuss any questions you have with your healthcare provider. Document Revised: 09/17/2019 Document Reviewed: 09/17/2019 [employer] Patient Education    2022 [employer]  

## 2023-10-30 NOTE — Progress Notes (Signed)
 Orthopaedic Postop Note  Assessment: [name] [name] is a 58 y.o. female s/p right open carpal tunnel release  DOS: 09/19/2023  Plan: [name] [name] continues to recover.  Overall, she still has some swelling and pain in the hand and wrist.  Incision is healing appropriately.  She has no numbness or tingling.  I provided reassurance.  She is not interested in medicines.  We can consider steroids in the future.  I provided her with simple exercises.  Anticipate gradual resolution, and improvement of her symptoms.  Can consider hand therapy.  She states understanding.  Follow-up in 6 weeks.  Follow-up: Return in about 6 weeks (around 12/11/2023). XR at next visit: None  Subjective:  Chief Complaint  Patient presents with   Routine Post Op    R CTR DOS: 09/19/2023: States still having pain and swelling along the wrist and in the thumb. Hasn't gotten any better and may be a little worse.     History of Present Illness: [name] [name] is a 58 y.o. female who presents following the above stated procedure.  Surgery was approximately 6 weeks ago.  She continues to have swelling in the hand, which is concerning to her.  She has some pain in the area of the surgical incision.  She denies any growth or drainage.  No fevers or chills.  She denies numbness and tingling in the right hand.  Massaging in line with the incision feels good overall.  Otherwise, she is not doing any thing specifically for her hand  Review of Systems: No fevers or chills No numbness or tingling No Chest Pain No shortness of breath   Objective: LMP 05/21/1997   Physical Exam:  Alert and oriented, no acute distress.  Surgical incision is healed.  No surrounding erythema or drainage.  Mild discomfort in this area.  Sensation is intact in the median nerve distribution.  Diffuse swelling over the thenar eminence and into the thumb.  Mild swelling into the hypothenar eminence.  Almost able to make a fist.  Strength is pretty  good.   IMAGING: I personally ordered and reviewed the following images:  No new imaging obtained today  [name] Frater, MD 10/30/2023 4:09 PM

## 2023-11-04 ENCOUNTER — Other Ambulatory Visit: Payer: Self-pay | Admitting: Internal Medicine

## 2023-11-04 DIAGNOSIS — E559 Vitamin D deficiency, unspecified: Secondary | ICD-10-CM

## 2023-11-04 DIAGNOSIS — I10 Essential (primary) hypertension: Secondary | ICD-10-CM

## 2023-11-04 DIAGNOSIS — K219 Gastro-esophageal reflux disease without esophagitis: Secondary | ICD-10-CM

## 2023-11-14 ENCOUNTER — Other Ambulatory Visit: Payer: Self-pay

## 2023-11-14 ENCOUNTER — Encounter: Payer: Self-pay | Admitting: Internal Medicine

## 2023-11-14 DIAGNOSIS — E782 Mixed hyperlipidemia: Secondary | ICD-10-CM

## 2023-11-14 MED ORDER — PRAVASTATIN SODIUM 10 MG PO TABS: 10.0000 mg | ORAL_TABLET | Freq: Every day | ORAL | 3 refills | Status: AC

## 2023-11-24 ENCOUNTER — Other Ambulatory Visit: Payer: Self-pay | Admitting: Internal Medicine

## 2023-11-24 DIAGNOSIS — E876 Hypokalemia: Secondary | ICD-10-CM

## 2023-11-26 ENCOUNTER — Other Ambulatory Visit: Payer: Self-pay | Admitting: Internal Medicine

## 2023-11-26 ENCOUNTER — Ambulatory Visit: Admitting: Internal Medicine

## 2023-11-26 ENCOUNTER — Encounter: Payer: Self-pay | Admitting: Internal Medicine

## 2023-11-26 DIAGNOSIS — E876 Hypokalemia: Secondary | ICD-10-CM

## 2023-11-26 MED ORDER — POTASSIUM CHLORIDE CRYS ER 20 MEQ PO TBCR: 20.0000 meq | EXTENDED_RELEASE_TABLET | Freq: Every day | ORAL | 1 refills | Status: AC

## 2023-11-28 ENCOUNTER — Ambulatory Visit (INDEPENDENT_AMBULATORY_CARE_PROVIDER_SITE_OTHER): Payer: Self-pay | Admitting: Internal Medicine

## 2023-11-28 ENCOUNTER — Encounter: Payer: Self-pay | Admitting: Internal Medicine

## 2023-11-28 ENCOUNTER — Other Ambulatory Visit (HOSPITAL_COMMUNITY): Payer: Self-pay

## 2023-11-28 VITALS — BP 128/71 | HR 80 | Resp 16 | Ht 62.0 in | Wt 178.2 lb

## 2023-11-28 DIAGNOSIS — R110 Nausea: Secondary | ICD-10-CM | POA: Diagnosis not present

## 2023-11-28 DIAGNOSIS — G4733 Obstructive sleep apnea (adult) (pediatric): Secondary | ICD-10-CM | POA: Diagnosis not present

## 2023-11-28 DIAGNOSIS — E66811 Obesity, class 1: Secondary | ICD-10-CM

## 2023-11-28 DIAGNOSIS — I10 Essential (primary) hypertension: Secondary | ICD-10-CM

## 2023-11-28 MED ORDER — PROMETHAZINE HCL 25 MG RE SUPP: 25.0000 mg | Freq: Three times a day (TID) | RECTAL | 1 refills | Status: AC | PRN

## 2023-11-28 MED ORDER — TIRZEPATIDE-WEIGHT MANAGEMENT 2.5 MG/0.5ML ~~LOC~~ SOAJ: 2.5000 mg | SUBCUTANEOUS | 0 refills | Status: DC

## 2023-11-28 NOTE — Assessment & Plan Note (Addendum)
 BMI Readings from Last 3 Encounters:  11/28/23 32.59 kg/m  10/10/23 32.59 kg/m  09/17/23 32.02 kg/m   Has tried to follow low carb diet and regular exercise for about 1 year, was able to lose 7 lbs with lifestyle modification Unable to take oral stimulants/appetite suppressants as she takes Adderall for ADD Good candidate for GLP1 agonist - started Zepbound , can help with OSA as well, plan to increase dose as tolerated Encouraged to continue to follow low-carb diet and perform moderate exercise/walking at least 150 minutes/week  Addendum: Zepbound  not covered by her insurance, will start Wegovy  - initial BMI: 32.59, plan to increase dose as tolerated

## 2023-11-28 NOTE — Patient Instructions (Addendum)
 Please start taking Zepbound as prescribed.  Please continue to take medications as prescribed.  Please continue to follow low carb diet and perform moderate exercise/walking at least 150 mins/week.

## 2023-11-28 NOTE — Progress Notes (Signed)
 Established Patient Office Visit  Subjective:  Patient ID: [name] [name], female    DOB: Jan 30, 1966  Age: 58 y.o. MRN: [id number]  CC:  Chief Complaint  Patient presents with   Weight Check    Wants to discuss loss options     HPI [name] [name] is a 58 y.o. female with past medical history of HTN,  GERD/gastritis, ADD with anxiety and obesity who presents for f/u of her chronic medical conditions.  HTN: BP is wnl today. Takes spironolactone  25 mg QD regularly and amlodipine  10 mg QD. Patient denies headache, dizziness, chest pain, dyspnea or palpitations.  Hypokalemia: She takes potassium supplement-20 mEq once daily currently and spironolactone  25 mg QD.  GAD and ADD: She has history of GAD and ADD, followed by psychiatry.  She has been more stressed recently due to loss of her job.  She is currently taking Xanax  as needed for anxiety. She is also on Adderall for ADD. Her BP is WNL. She reports snoring at nighttime. Had sleep study, showed mild OSA.  She reports diffuse myalgias, and attributes it to her weight gain.  She has been trying to follow low-carb diet. She is motivated to follow low carb diet and increase physical activity as tolerated.  She had lost about 7 lbs till the last visit with diet modification and moderate exercise.  She was not able to get Zepbound  due to insurance coverage concern.  Past Medical History:  Diagnosis Date   Allergy    Phreesia 09/19/2020   Anxiety    Bradycardia 03/02/2015   GERD (gastroesophageal reflux disease)    History of hysterectomy    History of oophorectomy    Hx of cholecystectomy    Hypertension    IDA (iron deficiency anemia)    Migraines    Near syncope 03/02/2015   Pre-diabetes    Sciatica neuralgia, right    Sleep apnea    Stroke (HCC)    light stroke 08/2015. No deficits   Tobacco abuse 03/03/2015   Tobacco abuse 03/03/2015   Vertigo     Past Surgical History:  Procedure Laterality Date   ABDOMINAL HYSTERECTOMY      BIOPSY  12/04/2016   Procedure: BIOPSY;  Surgeon: Harvey Margo CROME, MD;  Location: [facility];  Service: Endoscopy;;  gastric and duodenal biopsy   BIOPSY  07/30/2017   Procedure: BIOPSY;  Surgeon: Harvey Margo CROME, MD;  Location: [facility];  Service: Endoscopy;;  random colon bx's   CARPAL TUNNEL RELEASE Right 09/19/2023   Procedure: CARPAL TUNNEL RELEASE;  Surgeon: Onesimo Oneil LABOR, MD;  Location: [facility];  Service: Orthopedics;  Laterality: Right;   CATARACT EXTRACTION W/PHACO Left 05/19/2021   Procedure: CATARACT EXTRACTION PHACO AND INTRAOCULAR LENS PLACEMENT (IOC);  Surgeon: Harrie Agent, MD;  Location: [facility];  Service: Ophthalmology;  Laterality: Left;  CDE: 3.35   CATARACT EXTRACTION W/PHACO Right 06/09/2021   Procedure: CATARACT EXTRACTION PHACO AND INTRAOCULAR LENS PLACEMENT (IOC);  Surgeon: Harrie Agent, MD;  Location: [facility];  Service: Ophthalmology;  Laterality: Right;  CDE: 5.99   CESAREAN SECTION     x2   CESAREAN SECTION N/A    Phreesia 09/19/2020   CHOLECYSTECTOMY     COLONOSCOPY WITH ESOPHAGOGASTRODUODENOSCOPY (EGD)  2007   Dr. Obie: normal   COLONOSCOPY WITH PROPOFOL  N/A 07/30/2017   Dr. Harvey: Redundant colon, external/internal hemorrhoids. Colon otherwise normal.  Random colon biopsies negative.   ESOPHAGOGASTRODUODENOSCOPY (EGD) WITH PROPOFOL  N/A 12/04/2016   Dr. Harvey:  Gastritis but no H.pylori, small bowel bx negative. gastric polyps benign fundic gland   HEMORRHOID SURGERY     TOTAL SHOULDER ARTHROPLASTY Left 01/10/2023   Procedure: TOTAL SHOULDER ARTHROPLASTY;  Surgeon: Onesimo Oneil LABOR, MD;  Location: [facility];  Service: Orthopedics;  Laterality: Left;    Family History  Problem Relation Age of Onset   ALS Mother    Cardiomyopathy Father    Colon cancer Neg Hx     Social History   Socioeconomic History   Marital status: Married    Spouse name: Not on file   Number of children: Not on file   Years of education: Not on file   Highest education  level: Not on file  Occupational History   Not on file  Tobacco Use   Smoking status: Former    Current packs/day: 0.00    Average packs/day: 0.1 packs/day for 20.0 years (2.0 ttl pk-yrs)    Types: Cigarettes    Start date: 02/23/2001    Quit date: 02/23/2021    Years since quitting: 2.7   Smokeless tobacco: Never  Vaping Use   Vaping status: Never Used  Substance and Sexual Activity   Alcohol use: Not Currently    Comment: seldom   Drug use: Yes    Frequency: 2.0 times per week    Types: Marijuana    Comment: occ   Sexual activity: Yes    Birth control/protection: Surgical  Other Topics Concern   Not on file  Social History Narrative   Drinks 5-6 cups of coffee daily.   Social Drivers of Corporate investment banker Strain: Not on file  Food Insecurity: Not on file  Transportation Needs: Not on file  Physical Activity: Not on file  Stress: Not on file  Social Connections: Not on file  Intimate Partner Violence: Not on file    Outpatient Medications Prior to Visit  Medication Sig Dispense Refill   ADDERALL XR 30 MG 24 hr capsule Take 30 mg by mouth daily.     albuterol  (VENTOLIN  HFA) 108 (90 Base) MCG/ACT inhaler TAKE 2 PUFFS BY MOUTH EVERY 6 HOURS AS NEEDED FOR WHEEZE OR SHORTNESS OF BREATH 8 g 5   ALPRAZolam  (XANAX ) 1 MG tablet Take 1 mg by mouth 4 (four) times daily as needed for anxiety.     amphetamine-dextroamphetamine (ADDERALL) 30 MG tablet Take 30 mg by mouth in the morning.     cetirizine  (ZYRTEC ) 10 MG tablet Take 1 tablet (10 mg total) by mouth daily. (Patient taking differently: Take 10 mg by mouth daily as needed for allergies.) 90 tablet 3   Coenzyme Q10 (CO Q-10 PO) Take 1 capsule by mouth in the morning.     fluticasone  (FLONASE ) 50 MCG/ACT nasal spray Place 1 spray into both nostrils 2 (two) times daily. (Patient taking differently: Place 1 spray into both nostrils 2 (two) times daily as needed for allergies.) 48 mL 1   lidocaine  (LIDODERM ) 5 % Place 1  patch onto the skin daily. Remove & Discard patch within 12 hours or as directed by MD 30 patch 0   pantoprazole  (PROTONIX ) 40 MG tablet TAKE (1) TABLET BY MOUTH ONCE DAILY. 90 tablet 1   potassium chloride  SA (KLOR-CON  M) 20 MEQ tablet Take 1 tablet (20 mEq total) by mouth daily. 90 tablet 1   pravastatin  (PRAVACHOL ) 10 MG tablet Take 1 tablet (10 mg total) by mouth daily. 90 tablet 3   amLODipine  (NORVASC ) 10 MG tablet Take 1 tablet (10  mg total) by mouth daily. 90 tablet 1   Misc. Devices MISC Blood pressure cuff/device - 1. ICD10: I10 1 each 0   spironolactone  (ALDACTONE ) 25 MG tablet Take 1 tablet (25 mg total) by mouth daily. 90 tablet 1   Vitamin D , Ergocalciferol , (DRISDOL ) 1.25 MG (50000 UNIT) CAPS capsule Take 1 capsule (50,000 Units total) by mouth every 7 (seven) days. 12 capsule 1   diclofenac  (VOLTAREN ) 75 MG EC tablet Take 1 tablet (75 mg total) by mouth 2 (two) times daily as needed for mild pain (pain score 1-3) or moderate pain (pain score 4-6). (Patient not taking: Reported on 11/28/2023) 30 tablet 0   gabapentin  (NEURONTIN ) 100 MG capsule Take 1 capsule (100 mg total) by mouth 3 (three) times daily. (Patient not taking: Reported on 11/28/2023) 90 capsule 0   No facility-administered medications prior to visit.    Allergies  Allergen Reactions   Clindamycin/Lincomycin Swelling   Sulfa Antibiotics Hives and Nausea And Vomiting    ROS Review of Systems  Constitutional:  Negative for chills and fever.  HENT:  Negative for congestion, sinus pressure, sinus pain and sore throat.   Eyes:  Negative for pain and discharge.  Respiratory:  Negative for cough and shortness of breath.   Cardiovascular:  Negative for chest pain and palpitations.  Gastrointestinal:  Negative for abdominal pain, diarrhea, nausea and vomiting.  Endocrine: Negative for polydipsia and polyuria.  Genitourinary:  Negative for dysuria and hematuria.  Musculoskeletal:  Positive for arthralgias and back pain.  Negative for neck pain and neck stiffness.  Skin:  Negative for rash.  Neurological:  Negative for dizziness and weakness.  Psychiatric/Behavioral:  Negative for agitation and behavioral problems. The patient is nervous/anxious.       Objective:    Physical Exam Vitals reviewed.  Constitutional:      General: She is not in acute distress.    Appearance: She is obese. She is not diaphoretic.  HENT:     Head: Normocephalic and atraumatic.     Nose: Nose normal.     Mouth/Throat:     Mouth: Mucous membranes are moist.  Eyes:     General: No scleral icterus.    Extraocular Movements: Extraocular movements intact.  Cardiovascular:     Rate and Rhythm: Normal rate and regular rhythm.     Heart sounds: Normal heart sounds. No murmur heard. Pulmonary:     Breath sounds: Normal breath sounds. No wheezing or rales.  Musculoskeletal:     Cervical back: Neck supple. No tenderness.     Lumbar back: Tenderness (Right paraspinal) present. Positive right straight leg raise test. Negative left straight leg raise test.     Right lower leg: No edema.     Left lower leg: No edema.  Skin:    General: Skin is warm.     Findings: No rash.     Comments: About 1 cm in diameter cyst over left great toe  Neurological:     General: No focal deficit present.     Mental Status: She is alert and oriented to person, place, and time.     Sensory: No sensory deficit.     Motor: No weakness.  Psychiatric:        Mood and Affect: Mood normal.        Behavior: Behavior normal.     BP 128/71   Pulse 80   Resp 16   Ht 5' 2 (1.575 m)   Wt 178 lb 3.2 oz (  80.8 kg)   LMP 05/21/1997   SpO2 98%   BMI 32.59 kg/m  Wt Readings from Last 3 Encounters:  11/28/23 178 lb 3.2 oz (80.8 kg)  10/10/23 178 lb 3.2 oz (80.8 kg)  09/17/23 175 lb 0.7 oz (79.4 kg)    Lab Results  Component Value Date   TSH 0.893 04/05/2023   Lab Results  Component Value Date   WBC 5.2 09/17/2023   HGB 11.9 (L) 09/17/2023    HCT 37.2 09/17/2023   MCV 91.9 09/17/2023   PLT 217 09/17/2023   Lab Results  Component Value Date   NA 136 09/17/2023   K 3.7 09/17/2023   CO2 23 09/17/2023   GLUCOSE 141 (H) 09/17/2023   BUN 14 09/17/2023   CREATININE 1.10 (H) 09/17/2023   BILITOT 0.2 04/05/2023   ALKPHOS 112 04/05/2023   AST 19 04/05/2023   ALT 10 04/05/2023   PROT 7.3 04/05/2023   ALBUMIN 4.6 04/05/2023   CALCIUM  9.4 09/17/2023   ANIONGAP 10 09/17/2023   EGFR 71 04/05/2023   Lab Results  Component Value Date   CHOL 177 04/05/2023   Lab Results  Component Value Date   HDL 61 04/05/2023   Lab Results  Component Value Date   LDLCALC 105 (H) 04/05/2023   Lab Results  Component Value Date   TRIG 58 04/05/2023   Lab Results  Component Value Date   CHOLHDL 2.9 04/05/2023   Lab Results  Component Value Date   HGBA1C 5.4 09/17/2023      Assessment & Plan:   Problem List Items Addressed This Visit       Cardiovascular and Mediastinum   Essential hypertension (Chronic)   BP Readings from Last 1 Encounters:  11/28/23 128/71   Well-controlled with amlodipine  at 5 mg QD and spironolactone  25 mg QD Had started spironolactone  for HTN, leg swelling and can also help with hypokalemia Counseled for compliance with the medications Advised DASH diet and moderate exercise/walking, at least 150 mins/week        Respiratory   OSA (obstructive sleep apnea)   Recently had home sleep study, which showed mild OSA.  Considering her symptoms, she would benefit from CPAP treatment.  She is agreeable with the plan.  CPAP order placed in the last visit She would benefit from Zepbound  for OSA as well        Other   Obesity (BMI 30.0-34.9) - Primary   BMI Readings from Last 3 Encounters:  11/28/23 32.59 kg/m  10/10/23 32.59 kg/m  09/17/23 32.02 kg/m   Has tried to follow low carb diet and regular exercise for about 1 year, was able to lose 7 lbs with lifestyle modification Unable to take oral  stimulants/appetite suppressants as she takes Adderall for ADD Good candidate for GLP1 agonist - started Zepbound , can help with OSA as well, plan to increase dose as tolerated Encouraged to continue to follow low-carb diet and perform moderate exercise/walking at least 150 minutes/week  Addendum: Zepbound  not covered by her insurance, will start Wegovy  - initial BMI: 32.59, plan to increase dose as tolerated      Relevant Medications   Semaglutide -Weight Management (WEGOVY ) 0.25 MG/0.5ML SOAJ   Nausea   Offer Zofran  as needed for nausea, if she has it with GLP-1 agonist therapy She prefers to take suppository, Phenergan  suppository sent      Relevant Medications   promethazine  (PHENERGAN ) 25 MG suppository      Meds ordered this encounter  Medications  DISCONTD: tirzepatide  (ZEPBOUND ) 2.5 MG/0.5ML Pen    Sig: Inject 2.5 mg into the skin once a week.    Dispense:  2 mL    Refill:  0   promethazine  (PHENERGAN ) 25 MG suppository    Sig: Place 1 suppository (25 mg total) rectally every 8 (eight) hours as needed for nausea or vomiting.    Dispense:  12 each    Refill:  1   Semaglutide -Weight Management (WEGOVY ) 0.25 MG/0.5ML SOAJ    Sig: Inject 0.25 mg into the skin every 7 (seven) days.    Dispense:  2 mL    Refill:  0    Follow-up: Return in about 3 months (around 02/28/2024) for HTN and weight management.    Suzzane MARLA Blanch, MD

## 2023-11-29 ENCOUNTER — Other Ambulatory Visit: Payer: Self-pay

## 2023-11-29 ENCOUNTER — Telehealth: Payer: Self-pay | Admitting: Pharmacy Technician

## 2023-11-29 ENCOUNTER — Other Ambulatory Visit (HOSPITAL_COMMUNITY): Payer: Self-pay

## 2023-11-29 DIAGNOSIS — I10 Essential (primary) hypertension: Secondary | ICD-10-CM

## 2023-11-29 DIAGNOSIS — R110 Nausea: Secondary | ICD-10-CM | POA: Insufficient documentation

## 2023-11-29 DIAGNOSIS — E559 Vitamin D deficiency, unspecified: Secondary | ICD-10-CM

## 2023-11-29 MED ORDER — AMLODIPINE BESYLATE 10 MG PO TABS: 10.0000 mg | ORAL_TABLET | Freq: Every day | ORAL | 3 refills | Status: DC

## 2023-11-29 MED ORDER — VITAMIN D (ERGOCALCIFEROL) 1.25 MG (50000 UNIT) PO CAPS: 50000.0000 [IU] | ORAL_CAPSULE | ORAL | 1 refills | Status: DC

## 2023-11-29 MED ORDER — WEGOVY 0.25 MG/0.5ML ~~LOC~~ SOAJ: 0.2500 mg | SUBCUTANEOUS | 0 refills | Status: DC

## 2023-11-29 MED ORDER — SPIRONOLACTONE 25 MG PO TABS: 25.0000 mg | ORAL_TABLET | Freq: Every day | ORAL | 3 refills | Status: AC

## 2023-11-29 NOTE — Telephone Encounter (Signed)
 Pt aware and would like Wegovy  called in to see if that may be covered

## 2023-11-29 NOTE — Assessment & Plan Note (Signed)
 Recently had home sleep study, which showed mild OSA.  Considering her symptoms, she would benefit from CPAP treatment.  She is agreeable with the plan.  CPAP order placed in the last visit She would benefit from Zepbound  for OSA as well

## 2023-11-29 NOTE — Telephone Encounter (Signed)
 Pharmacy Patient Advocate Encounter  Received notification from [facility] [place]  that Prior Authorization for WEGOVY  0.25MG /0.5ML AUTO-INECTORS has been CANCELLED due to   PA #/Case ID/Reference #: [id number]

## 2023-11-29 NOTE — Telephone Encounter (Signed)
 Pharmacy Patient Advocate Encounter  Received notification from [facility] that Prior Authorization for Zepbound  2.5MG /0.5ML pen-injectors  has been CANCELLED due to   PA #/Case ID/Reference #: [id number]    I was going to try my best to get it approved for OSA even thought the pt's most recent sleep study showed mild OSA and the FDA approved indication is moderate to severe OSA. Looks like this drug is excluded from coverage all together.

## 2023-11-29 NOTE — Assessment & Plan Note (Signed)
 Offer Zofran  as needed for nausea, if she has it with GLP-1 agonist therapy She prefers to take suppository, Phenergan  suppository sent

## 2023-11-29 NOTE — Telephone Encounter (Signed)
 Video visit scheduled and pt aware

## 2023-11-29 NOTE — Assessment & Plan Note (Signed)
 BP Readings from Last 1 Encounters:  11/28/23 128/71   Well-controlled with amlodipine  at 5 mg QD and spironolactone  25 mg QD Had started spironolactone  for HTN, leg swelling and can also help with hypokalemia Counseled for compliance with the medications Advised DASH diet and moderate exercise/walking, at least 150 mins/week

## 2023-12-02 NOTE — Telephone Encounter (Signed)
 Mychart message sent to patient.

## 2023-12-04 ENCOUNTER — Encounter: Payer: Self-pay | Admitting: Internal Medicine

## 2023-12-11 ENCOUNTER — Ambulatory Visit: Admitting: Orthopedic Surgery

## 2023-12-11 ENCOUNTER — Encounter: Payer: Self-pay | Admitting: Orthopedic Surgery

## 2023-12-11 DIAGNOSIS — G5601 Carpal tunnel syndrome, right upper limb: Secondary | ICD-10-CM

## 2023-12-11 MED ORDER — PREDNISONE 10 MG (21) PO TBPK
ORAL_TABLET | ORAL | 0 refills | Status: DC
Start: 2023-12-11 — End: 2024-01-08

## 2023-12-11 NOTE — Progress Notes (Signed)
 Orthopaedic Postop Note  Assessment: [name] [name] is a 58 y.o. female s/p right open carpal tunnel release  DOS: 09/19/2023  Plan: [name] [name] continues to have some pain in the palmar hand.  She has some residual swelling in the hand as well.  She denies numbness and tingling.  No shooting pains.  I recommended a steroid Dosepak.  This was sent to her pharmacy.  Like see her back in 6 weeks.  Follow-up: Return in about 6 weeks (around 01/22/2024). XR at next visit: None  Subjective:  Chief Complaint  Patient presents with   Post-op Problem    Still having swelling, pain, and numbness at the wrist into thumb. R CTR DOS: 09/19/2023    History of Present Illness: [name] [name] is a 58 y.o. female who presents following the above stated procedure.  Surgery was approximately 10 weeks ago.  She is still having some swelling in the hand.  She has pain in the area of the incision.  This radiates into the thumb.    Review of Systems: No fevers or chills No numbness or tingling No Chest Pain No shortness of breath   Objective: LMP 05/21/1997   Physical Exam:  Alert and oriented, no acute distress.  Surgical incision is healed.  No surrounding erythema or drainage.  Some sensitivity to the incision.  Diffuse swelling of the right hand.  No bruising.  Sensation intact in the median nerve distribution.   IMAGING: I personally ordered and reviewed the following images:  No new imaging obtained today  Oneil DELENA Horde, MD 12/11/2023 1:56 PM

## 2023-12-30 ENCOUNTER — Ambulatory Visit: Payer: Self-pay

## 2023-12-30 ENCOUNTER — Encounter: Payer: Self-pay | Admitting: Internal Medicine

## 2023-12-30 NOTE — Telephone Encounter (Signed)
 FYI Only or Action Required?: Action required by provider: request for appointment and refused Emergency Department recommendation.  Patient was last seen in primary care on 11/28/2023 by Tobie Suzzane POUR, MD.  Called Nurse Triage reporting Fall and Dizziness.  Symptoms began about a month ago.  Interventions attempted: Rest, hydration, or home remedies.  Symptoms are: unchanged.  Triage Disposition: See HCP Within 4 Hours (Or PCP Triage)-refused ED recommendation-would like to be seen in office. Asking for a call back  Patient/caregiver understands and will follow disposition?: No, wishes to speak with PCP  Copied from CRM #[id number]. Topic: Clinical - Red Word Triage >> Dec 30, 2023 10:26 AM Tiffini S wrote: [facility] that prompted transfer to Nurse Triage: Patient called to schedule appointment with pcp for 12/31/23- Video- she is falling/ losing her balance/ have vertigo- she started falling once a week for the last month or two month. Patient is in pain today/ transfer to triage nurse. Reason for Disposition  [1] MODERATE weakness (e.g., interferes with work, school, normal activities) AND [2] new-onset or getting worse  Patient sounds very sick or weak to the triager  Answer Assessment - Initial Assessment Questions 1. MECHANISM: How did the fall happen?     Patient is unsure if how she fell yesterday-reports walking down the hall and found herself on the floor. No LOC.  2. DOMESTIC VIOLENCE AND ELDER ABUSE SCREENING: Did you fall because someone pushed you or tried to hurt you? If Yes, ask: Are you safe now?     no 3. ONSET: When did the fall happen? 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 Patient states she has been falling for about a month and the falls are happening more often 4. LOCATION: What part of the body hit the ground? (e.g., back, buttocks, head, hips, knees, hands, head, stomach)     Fell on her right shoulder.  5. INJURY: Did you hurt  (injure) yourself when you fell? If Yes, ask: What did you injure? Tell me more about this? (e.g., body area; type of injury; pain severity)     Right shoulder, right knee 6. PAIN: Is there any pain? If Yes, ask: How bad is the pain? (e.g., Scale 0-10; or none, mild,      9 out of 10 7. SIZE: For cuts, bruises, or swelling, ask: How large is it? (e.g., inches or centimeters)      Swelling to knee.  9. OTHER SYMPTOMS: Do you have any other symptoms? (e.g., dizziness, fever, weakness; new-onset or worsening).      Lightheadedness 10. CAUSE: What do you think caused the fall (or falling)? (e.g., dizzy spell, tripped)       Patient in unsure of why she is falling.  Answer Assessment - Initial Assessment Questions 1. DESCRIPTION: Describe your dizziness.     Patient reports feeling dizzy and falling multiple times over the last month 2. LIGHTHEADED: Do you feel lightheaded? (e.g., somewhat faint, woozy, weak upon standing)     yes 3. VERTIGO: Do you feel like either you or the room is spinning or tilting? (i.e., vertigo)     yes 4. SEVERITY: How bad is it?  Do you feel like you are going to faint? Can you stand and walk?     Mild-moderate 5. ONSET:  When did the dizziness begin?     Started a month ago 6. AGGRAVATING FACTORS: Does anything make it worse? (e.g., standing, change in head position)  Unsure if anything makes it worse 7. HEART RATE: Can you tell me your heart rate? How many beats in 15 seconds?  (Note: Not all patients can do this.)       NA 8. CAUSE: What do you think is causing the dizziness? (e.g., decreased fluids or food, diarrhea, emotional distress, heat exposure, new medicine, sudden standing, vomiting; unknown)     unsure 9. RECURRENT SYMPTOM: Have you had dizziness before? If Yes, ask: When was the last time? What happened that time?     On and off 10. OTHER SYMPTOMS: Do you have any other symptoms? (e.g., fever, chest  pain, vomiting, diarrhea, bleeding)       no  Protocols used: Falls and Falling-A-AH, Dizziness - Lightheadedness-A-AH

## 2023-12-31 ENCOUNTER — Ambulatory Visit: Payer: Self-pay | Admitting: Internal Medicine

## 2023-12-31 NOTE — Telephone Encounter (Signed)
 Spoke to pt let, verbalized understanding , scheduled for 01/08/24

## 2024-01-01 ENCOUNTER — Encounter: Payer: Self-pay | Admitting: Internal Medicine

## 2024-01-01 ENCOUNTER — Other Ambulatory Visit: Payer: Self-pay

## 2024-01-01 DIAGNOSIS — R42 Dizziness and giddiness: Secondary | ICD-10-CM

## 2024-01-01 MED ORDER — MECLIZINE HCL 25 MG PO TABS: 25.0000 mg | ORAL_TABLET | Freq: Three times a day (TID) | ORAL | 0 refills | Status: AC

## 2024-01-02 ENCOUNTER — Encounter: Payer: Self-pay | Admitting: Internal Medicine

## 2024-01-08 ENCOUNTER — Ambulatory Visit: Admitting: Internal Medicine

## 2024-01-08 ENCOUNTER — Encounter: Payer: Self-pay | Admitting: Internal Medicine

## 2024-01-08 VITALS — BP 131/85 | HR 89 | Ht 62.0 in | Wt 176.0 lb

## 2024-01-08 DIAGNOSIS — R42 Dizziness and giddiness: Secondary | ICD-10-CM | POA: Insufficient documentation

## 2024-01-08 DIAGNOSIS — R55 Syncope and collapse: Secondary | ICD-10-CM | POA: Diagnosis not present

## 2024-01-08 DIAGNOSIS — I10 Essential (primary) hypertension: Secondary | ICD-10-CM

## 2024-01-08 DIAGNOSIS — M51362 Other intervertebral disc degeneration, lumbar region with discogenic back pain and lower extremity pain: Secondary | ICD-10-CM

## 2024-01-08 MED ORDER — DICLOFENAC SODIUM 75 MG PO TBEC: 75.0000 mg | DELAYED_RELEASE_TABLET | Freq: Two times a day (BID) | ORAL | 2 refills | Status: AC | PRN

## 2024-01-08 MED ORDER — AMLODIPINE BESYLATE 5 MG PO TABS: 5.0000 mg | ORAL_TABLET | Freq: Every day | ORAL | 3 refills | Status: AC

## 2024-01-08 NOTE — Assessment & Plan Note (Signed)
 X-ray of lumbar spine reviewed, showed mild degenerative changes Diclofenac  as needed for pain She has gabapentin , but has not been taking it Prefer to avoid steroids due to weight gain concern Avoid heavy lifting and frequent bending Simple back exercises advised

## 2024-01-08 NOTE — Patient Instructions (Addendum)
 Please start taking Amlodipine  5 mg once daily instead of 10 mg.  Please maintain at least 64 ounces of fluid intake in a day and eat at regular intervals.

## 2024-01-08 NOTE — Assessment & Plan Note (Signed)
 BP Readings from Last 1 Encounters:  01/08/24 131/85   Well-controlled with amlodipine  at 10 mg QD and spironolactone  25 mg QD Had advised her to take half tablet of amlodipine , but she apparently still taking 10 mg QD - reduce dose of amlodipine  to 5 mg QD, new prescription sent Had started spironolactone  for HTN, leg swelling and can also help with hypokalemia Counseled for compliance with the medications Advised DASH diet and moderate exercise/walking, at least 150 mins/week

## 2024-01-08 NOTE — Assessment & Plan Note (Signed)
 Her dizziness and episodes of presyncope are likely due to orthostatic hypotension Will decrease the dose of amlodipine  to 5 mg QD, continue spironolactone  as it also helps with leg swelling and hypokalemia Advised to maintain adequate hydration and eat at regular intervals

## 2024-01-08 NOTE — Progress Notes (Signed)
 Acute Office Visit  Subjective:    Patient ID: [name] [name], female    DOB: 06-Oct-1965, 58 y.o.   MRN: [id number]  Chief Complaint  Patient presents with   Dizziness    Pt reports sx of dizziness and falling, ongoing for about 1 months.    HPI Patient is in today for complaint of dizziness for the last 1 month.  She reports positional dizziness, especially upon standing.  She reports episodes of lightheadedness at times.  Of note, she has recently started taking amlodipine  10 mg QD again.  She also takes spironolactone  25 mg QD, which also helps her with leg swelling and hypokalemia.  Her BP is WNL today, but has had low BP at home.  She was given meclizine  in the past when she had room spinning sensation, but has not taken it recently.  Denies any nausea or vomiting.  She also reports chronic low back pain, which is constant, dull, radiating towards right buttock and LE.  She has tried ibuprofen , Flexeril  and lidocaine  patch with mild relief.  She has tried diclofenac  in the past with adequate relief.  Past Medical History:  Diagnosis Date   Allergy    Phreesia 09/19/2020   Anxiety    Bradycardia 03/02/2015   GERD (gastroesophageal reflux disease)    History of hysterectomy    History of oophorectomy    Hx of cholecystectomy    Hypertension    IDA (iron deficiency anemia)    Migraines    Near syncope 03/02/2015   Pre-diabetes    Sciatica neuralgia, right    Sleep apnea    Stroke (HCC)    light stroke 08/2015. No deficits   Tobacco abuse 03/03/2015   Tobacco abuse 03/03/2015   Vertigo     Past Surgical History:  Procedure Laterality Date   ABDOMINAL HYSTERECTOMY     BIOPSY  12/04/2016   Procedure: BIOPSY;  Surgeon: Harvey Margo CROME, MD;  Location: [facility];  Service: Endoscopy;;  gastric and duodenal biopsy   BIOPSY  07/30/2017   Procedure: BIOPSY;  Surgeon: Harvey Margo CROME, MD;  Location: [facility];  Service: Endoscopy;;  random colon bx's   CARPAL TUNNEL  RELEASE Right 09/19/2023   Procedure: CARPAL TUNNEL RELEASE;  Surgeon: Onesimo Oneil LABOR, MD;  Location: [facility];  Service: Orthopedics;  Laterality: Right;   CATARACT EXTRACTION W/PHACO Left 05/19/2021   Procedure: CATARACT EXTRACTION PHACO AND INTRAOCULAR LENS PLACEMENT (IOC);  Surgeon: Harrie Agent, MD;  Location: [facility];  Service: Ophthalmology;  Laterality: Left;  CDE: 3.35   CATARACT EXTRACTION W/PHACO Right 06/09/2021   Procedure: CATARACT EXTRACTION PHACO AND INTRAOCULAR LENS PLACEMENT (IOC);  Surgeon: Harrie Agent, MD;  Location: [facility];  Service: Ophthalmology;  Laterality: Right;  CDE: 5.99   CESAREAN SECTION     x2   CESAREAN SECTION N/A    Phreesia 09/19/2020   CHOLECYSTECTOMY     COLONOSCOPY WITH ESOPHAGOGASTRODUODENOSCOPY (EGD)  2007   Dr. Obie: normal   COLONOSCOPY WITH PROPOFOL  N/A 07/30/2017   Dr. Harvey: Redundant colon, external/internal hemorrhoids. Colon otherwise normal.  Random colon biopsies negative.   ESOPHAGOGASTRODUODENOSCOPY (EGD) WITH PROPOFOL  N/A 12/04/2016   Dr. Harvey: Gastritis but no H.pylori, small bowel bx negative. gastric polyps benign fundic gland   HEMORRHOID SURGERY     TOTAL SHOULDER ARTHROPLASTY Left 01/10/2023   Procedure: TOTAL SHOULDER ARTHROPLASTY;  Surgeon: Onesimo Oneil LABOR, MD;  Location: [facility];  Service: Orthopedics;  Laterality: Left;    Family  History  Problem Relation Age of Onset   ALS Mother    Cardiomyopathy Father    Colon cancer Neg Hx     Social History   Socioeconomic History   Marital status: Married    Spouse name: Not on file   Number of children: Not on file   Years of education: Not on file   Highest education level: Not on file  Occupational History   Not on file  Tobacco Use   Smoking status: Former    Current packs/day: 0.00    Average packs/day: 0.1 packs/day for 20.0 years (2.0 ttl pk-yrs)    Types: Cigarettes    Start date: 02/23/2001    Quit date: 02/23/2021    Years since quitting: 2.8   Smokeless  tobacco: Never  Vaping Use   Vaping status: Never Used  Substance and Sexual Activity   Alcohol use: Not Currently    Comment: seldom   Drug use: Yes    Frequency: 2.0 times per week    Types: Marijuana    Comment: occ   Sexual activity: Yes    Birth control/protection: Surgical  Other Topics Concern   Not on file  Social History Narrative   Drinks 5-6 cups of coffee daily.   Social Drivers of Corporate investment banker Strain: Not on file  Food Insecurity: Not on file  Transportation Needs: Not on file  Physical Activity: Not on file  Stress: Not on file  Social Connections: Not on file  Intimate Partner Violence: Not on file    Outpatient Medications Prior to Visit  Medication Sig Dispense Refill   ADDERALL XR 30 MG 24 hr capsule Take 30 mg by mouth daily.     albuterol  (VENTOLIN  HFA) 108 (90 Base) MCG/ACT inhaler TAKE 2 PUFFS BY MOUTH EVERY 6 HOURS AS NEEDED FOR WHEEZE OR SHORTNESS OF BREATH 8 g 5   ALPRAZolam  (XANAX ) 1 MG tablet Take 1 mg by mouth 4 (four) times daily as needed for anxiety.     amphetamine-dextroamphetamine (ADDERALL) 30 MG tablet Take 30 mg by mouth in the morning.     cetirizine  (ZYRTEC ) 10 MG tablet Take 1 tablet (10 mg total) by mouth daily. (Patient taking differently: Take 10 mg by mouth daily as needed for allergies.) 90 tablet 3   Coenzyme Q10 (CO Q-10 PO) Take 1 capsule by mouth in the morning.     fluticasone  (FLONASE ) 50 MCG/ACT nasal spray Place 1 spray into both nostrils 2 (two) times daily. (Patient taking differently: Place 1 spray into both nostrils 2 (two) times daily as needed for allergies.) 48 mL 1   gabapentin  (NEURONTIN ) 100 MG capsule Take 1 capsule (100 mg total) by mouth 3 (three) times daily. 90 capsule 0   lidocaine  (LIDODERM ) 5 % Place 1 patch onto the skin daily. Remove & Discard patch within 12 hours or as directed by MD 30 patch 0   pantoprazole  (PROTONIX ) 40 MG tablet TAKE (1) TABLET BY MOUTH ONCE DAILY. 90 tablet 1    potassium chloride  SA (KLOR-CON  M) 20 MEQ tablet Take 1 tablet (20 mEq total) by mouth daily. 90 tablet 1   pravastatin  (PRAVACHOL ) 10 MG tablet Take 1 tablet (10 mg total) by mouth daily. 90 tablet 3   promethazine  (PHENERGAN ) 25 MG suppository Place 1 suppository (25 mg total) rectally every 8 (eight) hours as needed for nausea or vomiting. 12 each 1   spironolactone  (ALDACTONE ) 25 MG tablet Take 1 tablet (25 mg total) by  mouth daily. 90 tablet 3   Vitamin D , Ergocalciferol , (DRISDOL ) 1.25 MG (50000 UNIT) CAPS capsule Take 1 capsule (50,000 Units total) by mouth every 7 (seven) days. 12 capsule 1   amLODipine  (NORVASC ) 10 MG tablet Take 1 tablet (10 mg total) by mouth daily. 90 tablet 3   predniSONE  (STERAPRED UNI-PAK 21 TAB) 10 MG (21) TBPK tablet 10 mg DS 12 as directed 48 tablet 0   Semaglutide -Weight Management (WEGOVY ) 0.25 MG/0.5ML SOAJ Inject 0.25 mg into the skin every 7 (seven) days. 2 mL 0   meclizine  (ANTIVERT ) 25 MG tablet Take 1 tablet (25 mg total) by mouth 3 (three) times daily. (Patient not taking: Reported on 01/08/2024) 30 tablet 0   diclofenac  (VOLTAREN ) 75 MG EC tablet Take 1 tablet (75 mg total) by mouth 2 (two) times daily as needed for mild pain (pain score 1-3) or moderate pain (pain score 4-6). (Patient not taking: Reported on 01/08/2024) 30 tablet 0   No facility-administered medications prior to visit.    Allergies  Allergen Reactions   Clindamycin/Lincomycin Swelling   Sulfa Antibiotics Hives and Nausea And Vomiting    Review of Systems  Constitutional:  Negative for chills and fever.  HENT:  Negative for congestion, sinus pressure, sinus pain and sore throat.   Eyes:  Negative for pain and discharge.  Respiratory:  Negative for cough and shortness of breath.   Cardiovascular:  Negative for chest pain and palpitations.  Gastrointestinal:  Negative for abdominal pain, diarrhea, nausea and vomiting.  Endocrine: Negative for polydipsia and polyuria.   Genitourinary:  Negative for dysuria and hematuria.  Musculoskeletal:  Positive for arthralgias and back pain. Negative for neck pain and neck stiffness.  Skin:  Negative for rash.  Neurological:  Positive for dizziness. Negative for weakness.  Psychiatric/Behavioral:  Negative for agitation and behavioral problems. The patient is nervous/anxious.        Objective:    Physical Exam Vitals reviewed.  Constitutional:      General: She is not in acute distress.    Appearance: She is obese. She is not diaphoretic.  HENT:     Head: Normocephalic and atraumatic.     Nose: Nose normal.     Mouth/Throat:     Mouth: Mucous membranes are moist.  Eyes:     General: No scleral icterus.    Extraocular Movements: Extraocular movements intact.  Cardiovascular:     Rate and Rhythm: Normal rate and regular rhythm.     Heart sounds: Normal heart sounds. No murmur heard. Pulmonary:     Breath sounds: Normal breath sounds. No wheezing or rales.  Musculoskeletal:     Cervical back: Neck supple. No tenderness.     Lumbar back: Tenderness (Right paraspinal) present. Positive right straight leg raise test. Negative left straight leg raise test.     Right lower leg: No edema.     Left lower leg: No edema.  Skin:    General: Skin is warm.     Findings: No rash.     Comments: About 1 cm in diameter cyst over left great toe  Neurological:     General: No focal deficit present.     Mental Status: She is alert and oriented to person, place, and time.     Sensory: No sensory deficit.     Motor: No weakness.  Psychiatric:        Mood and Affect: Mood normal.        Behavior: Behavior normal.  BP 131/85 (BP Location: Left Arm, Patient Position: Sitting)   Pulse 89   Ht 5' 2 (1.575 m)   Wt 176 lb (79.8 kg)   LMP 05/21/1997   SpO2 100%   BMI 32.19 kg/m  Wt Readings from Last 3 Encounters:  01/08/24 176 lb (79.8 kg)  11/28/23 178 lb 3.2 oz (80.8 kg)  10/10/23 178 lb 3.2 oz (80.8 kg)         Assessment & Plan:   Problem List Items Addressed This Visit       Cardiovascular and Mediastinum   Essential hypertension (Chronic)   BP Readings from Last 1 Encounters:  01/08/24 131/85   Well-controlled with amlodipine  at 10 mg QD and spironolactone  25 mg QD Had advised her to take half tablet of amlodipine , but she apparently still taking 10 mg QD - reduce dose of amlodipine  to 5 mg QD, new prescription sent Had started spironolactone  for HTN, leg swelling and can also help with hypokalemia Counseled for compliance with the medications Advised DASH diet and moderate exercise/walking, at least 150 mins/week      Relevant Medications   amLODipine  (NORVASC ) 5 MG tablet   Postural dizziness with presyncope - Primary   Her dizziness and episodes of presyncope are likely due to orthostatic hypotension Will decrease the dose of amlodipine  to 5 mg QD, continue spironolactone  as it also helps with leg swelling and hypokalemia Advised to maintain adequate hydration and eat at regular intervals      Relevant Medications   amLODipine  (NORVASC ) 5 MG tablet     Musculoskeletal and Integument   DDD (degenerative disc disease), lumbar   X-ray of lumbar spine reviewed, showed mild degenerative changes Diclofenac  as needed for pain She has gabapentin , but has not been taking it Prefer to avoid steroids due to weight gain concern Avoid heavy lifting and frequent bending Simple back exercises advised      Relevant Medications   diclofenac  (VOLTAREN ) 75 MG EC tablet     Meds ordered this encounter  Medications   diclofenac  (VOLTAREN ) 75 MG EC tablet    Sig: Take 1 tablet (75 mg total) by mouth 2 (two) times daily as needed for mild pain (pain score 1-3) or moderate pain (pain score 4-6).    Dispense:  30 tablet    Refill:  2   amLODipine  (NORVASC ) 5 MG tablet    Sig: Take 1 tablet (5 mg total) by mouth daily.    Dispense:  90 tablet    Refill:  3    Dose change -  01/08/24     Suzzane MARLA Blanch, MD

## 2024-02-24 ENCOUNTER — Encounter: Payer: Self-pay | Admitting: Internal Medicine

## 2024-02-25 ENCOUNTER — Ambulatory Visit

## 2024-02-25 ENCOUNTER — Other Ambulatory Visit: Payer: Self-pay

## 2024-02-25 DIAGNOSIS — R059 Cough, unspecified: Secondary | ICD-10-CM

## 2024-02-26 ENCOUNTER — Encounter: Payer: Self-pay | Admitting: Internal Medicine

## 2024-02-26 ENCOUNTER — Telehealth (INDEPENDENT_AMBULATORY_CARE_PROVIDER_SITE_OTHER): Payer: Self-pay | Admitting: Internal Medicine

## 2024-02-26 DIAGNOSIS — U071 COVID-19: Secondary | ICD-10-CM

## 2024-02-26 MED ORDER — NIRMATRELVIR/RITONAVIR (PAXLOVID)TABLET: 3.0000 | ORAL_TABLET | Freq: Two times a day (BID) | ORAL | 0 refills | Status: DC

## 2024-02-26 NOTE — Assessment & Plan Note (Signed)
 Of symptoms and close exposure to COVID positive patient suggests likely COVID infection Check COVID RT-PCR Started Paxlovid, coupon provided Mucinex as needed for cough Advised to maintain adequate hydration and eat at regular intervals

## 2024-02-26 NOTE — Patient Instructions (Addendum)
 Please start taking Paxlovid as prescribed.  Please maintain at least 64 ounces of fluid intake in a day and eat at regular intervals.  Please take Mucinex as needed for cough.  Please take Tylenol  as needed for fever or muscle aches.

## 2024-02-26 NOTE — Progress Notes (Signed)
 Virtual Visit via Video Note   Because of [name] [name] co-morbid illnesses, she is at least at moderate risk for complications without adequate follow up.  This format is felt to be most appropriate for this patient at this time.  All issues noted in this document were discussed and addressed.  A limited physical exam was performed with this format.      Evaluation Performed:  Follow-up visit  Date:  02/26/2024   ID:  [name], [name] Dec 31, 1965, MRN [id number]  Patient Location: Home Provider Location: Office/Clinic  Participants: Patient Location of Patient: Home Location of Provider: Telehealth Consent was obtain for visit to be over via telehealth. I verified that I am speaking with the correct person using two identifiers.  PCP:  [name] Suzzane POUR, MD   Chief Complaint: Fatigue, muscle aches and nasal congestion  History of Present Illness:    [name] [name] is a 58 y.o. female who has a video visit for complaint of fatigue, muscle aches and nasal congestion for the last 4 days.  She also reports loose BM for the last 3 days.  She was with her cousin 5 days ago, who tested positive for COVID later.  She reports weakly positive home COVID test for herself 2 days ago.  She denies any fever, chills, dyspnea or wheezing currently.  The patient does have symptoms concerning for COVID-19 infection (fever, chills, cough, or new shortness of breath).   Past Medical, Surgical, Social History, Allergies, and Medications have been Reviewed.  Past Medical History:  Diagnosis Date   Allergy    Phreesia 09/19/2020   Anxiety    Bradycardia 03/02/2015   GERD (gastroesophageal reflux disease)    History of hysterectomy    History of oophorectomy    Hx of cholecystectomy    Hypertension    IDA (iron deficiency anemia)    Migraines    Near syncope 03/02/2015   Pre-diabetes    Sciatica neuralgia, right    Sleep apnea    Stroke (HCC)    light stroke 08/2015. No deficits    Tobacco abuse 03/03/2015   Tobacco abuse 03/03/2015   Vertigo    Past Surgical History:  Procedure Laterality Date   ABDOMINAL HYSTERECTOMY     BIOPSY  12/04/2016   Procedure: BIOPSY;  Surgeon: Harvey Margo CROME, MD;  [facility]: [facility];  Service: Endoscopy;;  gastric and duodenal biopsy   BIOPSY  07/30/2017   Procedure: BIOPSY;  Surgeon: Harvey Margo CROME, MD;  [facility];  Service: Endoscopy;;  random colon bx's   CARPAL TUNNEL RELEASE Right 09/19/2023   Procedure: CARPAL TUNNEL RELEASE;  Surgeon: Onesimo Oneil LABOR, MD;  Location: [facility];  Service: Orthopedics;  Laterality: Right;   CATARACT EXTRACTION W/PHACO Left 05/19/2021   Procedure: CATARACT EXTRACTION PHACO AND INTRAOCULAR LENS PLACEMENT (IOC);  Surgeon: Harrie Agent, MD;  Location: [facility];  Service: Ophthalmology;  Laterality: Left;  CDE: 3.35   CATARACT EXTRACTION W/PHACO Right 06/09/2021   Procedure: CATARACT EXTRACTION PHACO AND INTRAOCULAR LENS PLACEMENT (IOC);  Surgeon: Harrie Agent, MD;  Location: [facility];  Service: Ophthalmology;  Laterality: Right;  CDE: 5.99   CESAREAN SECTION     x2   CESAREAN SECTION N/A    Phreesia 09/19/2020   CHOLECYSTECTOMY     COLONOSCOPY WITH ESOPHAGOGASTRODUODENOSCOPY (EGD)  2007   Dr. Obie: normal   COLONOSCOPY WITH PROPOFOL  N/A 07/30/2017   Dr. Harvey: Redundant colon, external/internal hemorrhoids. Colon otherwise normal.  Random colon biopsies negative.   ESOPHAGOGASTRODUODENOSCOPY (EGD) WITH PROPOFOL  N/A 12/04/2016   Dr. Harvey: Gastritis but no H.pylori, small bowel bx negative. gastric polyps benign fundic gland   HEMORRHOID SURGERY     TOTAL SHOULDER ARTHROPLASTY Left 01/10/2023   Procedure: TOTAL SHOULDER ARTHROPLASTY;  Surgeon: Onesimo Oneil LABOR, MD;  Location: [facility];  Service: Orthopedics;  Laterality: Left;     Current Meds  Medication Sig   ADDERALL XR 30 MG 24 hr capsule Take 30 mg by mouth daily.   albuterol  (VENTOLIN  HFA) 108 (90 Base) MCG/ACT inhaler TAKE 2 PUFFS  BY MOUTH EVERY 6 HOURS AS NEEDED FOR WHEEZE OR SHORTNESS OF BREATH   ALPRAZolam  (XANAX ) 1 MG tablet Take 1 mg by mouth 4 (four) times daily as needed for anxiety.   amLODipine  (NORVASC ) 5 MG tablet Take 1 tablet (5 mg total) by mouth daily.   amphetamine-dextroamphetamine (ADDERALL) 30 MG tablet Take 30 mg by mouth in the morning.   cetirizine  (ZYRTEC ) 10 MG tablet Take 1 tablet (10 mg total) by mouth daily. (Patient taking differently: Take 10 mg by mouth daily as needed for allergies.)   Coenzyme Q10 (CO Q-10 PO) Take 1 capsule by mouth in the morning.   diclofenac  (VOLTAREN ) 75 MG EC tablet Take 1 tablet (75 mg total) by mouth 2 (two) times daily as needed for mild pain (pain score 1-3) or moderate pain (pain score 4-6).   fluticasone  (FLONASE ) 50 MCG/ACT nasal spray Place 1 spray into both nostrils 2 (two) times daily. (Patient taking differently: Place 1 spray into both nostrils 2 (two) times daily as needed for allergies.)   gabapentin  (NEURONTIN ) 100 MG capsule Take 1 capsule (100 mg total) by mouth 3 (three) times daily.   lidocaine  (LIDODERM ) 5 % Place 1 patch onto the skin daily. Remove & Discard patch within 12 hours or as directed by MD   meclizine  (ANTIVERT ) 25 MG tablet Take 1 tablet (25 mg total) by mouth 3 (three) times daily.   pantoprazole  (PROTONIX ) 40 MG tablet TAKE (1) TABLET BY MOUTH ONCE DAILY.   potassium chloride  SA (KLOR-CON  M) 20 MEQ tablet Take 1 tablet (20 mEq total) by mouth daily.   pravastatin  (PRAVACHOL ) 10 MG tablet Take 1 tablet (10 mg total) by mouth daily.   promethazine  (PHENERGAN ) 25 MG suppository Place 1 suppository (25 mg total) rectally every 8 (eight) hours as needed for nausea or vomiting.   spironolactone  (ALDACTONE ) 25 MG tablet Take 1 tablet (25 mg total) by mouth daily.   Vitamin D , Ergocalciferol , (DRISDOL ) 1.25 MG (50000 UNIT) CAPS capsule Take 1 capsule (50,000 Units total) by mouth every 7 (seven) days.     Allergies:   Clindamycin/lincomycin  and Sulfa antibiotics   ROS:   Please see the history of present illness. All other systems reviewed and are negative.   Labs/Other Tests and Data Reviewed:    Recent Labs: 04/05/2023: ALT 10; TSH 0.893 09/17/2023: BUN 14; Creatinine, Ser 1.10; Hemoglobin 11.9; Platelets 217; Potassium 3.7; Sodium 136   Recent Lipid Panel Lab Results  Component Value Date/Time   CHOL 177 04/05/2023 09:22 AM   TRIG 58 04/05/2023 09:22 AM   HDL 61 04/05/2023 09:22 AM   CHOLHDL 2.9 04/05/2023 09:22 AM   LDLCALC 105 (H) 04/05/2023 09:22 AM    Wt Readings from Last 3 Encounters:  01/08/24 176 lb (79.8 kg)  11/28/23 178 lb 3.2 oz (80.8 kg)  10/10/23 178 lb 3.2 oz (80.8 kg)     Objective:  Vital Signs:  LMP 05/21/1997    VITAL SIGNS:  reviewed GEN:  no acute distress EYES:  sclerae anicteric, EOMI - Extraocular Movements Intact RESPIRATORY:  normal respiratory effort, symmetric expansion NEURO:  alert and oriented x 3, no obvious focal deficit PSYCH:  normal affect  ASSESSMENT & PLAN:    COVID-19 Of symptoms and close exposure to COVID positive patient suggests likely COVID infection Check COVID RT-PCR Started Paxlovid, coupon provided Mucinex as needed for cough Advised to maintain adequate hydration and eat at regular intervals    I discussed the assessment and treatment plan with the patient. The patient was provided an opportunity to ask questions, and all were answered. The patient agreed with the plan and demonstrated an understanding of the instructions.   The patient was advised to call back or seek an in-person evaluation if the symptoms worsen or if the condition fails to improve as anticipated.  The above assessment and management plan was discussed with the patient. The patient verbalized understanding of and has agreed to the management plan.   Medication Adjustments/Labs and Tests Ordered: Current medicines are reviewed at length with the patient today.  Concerns  regarding medicines are outlined above.   Tests Ordered: No orders of the defined types were placed in this encounter.   Medication Changes: No orders of the defined types were placed in this encounter.    Note: This dictation was prepared with Dragon dictation along with smaller phrase technology. Similar sounding words can be transcribed inadequately or may not be corrected upon review. Any transcriptional errors that result from this process are unintentional.      Disposition:  Follow up  Signed, Suzzane MARLA Blanch, MD  02/26/2024 11:48 AM     [facility]

## 2024-02-27 ENCOUNTER — Encounter: Payer: Self-pay | Admitting: Internal Medicine

## 2024-02-27 MED ORDER — NIRMATRELVIR/RITONAVIR (PAXLOVID)TABLET: 3.0000 | ORAL_TABLET | Freq: Two times a day (BID) | ORAL | 0 refills | Status: AC

## 2024-02-27 NOTE — Addendum Note (Signed)
 Addended byBETHA TOBIE DOWNS on: 02/27/2024 12:15 PM   Modules accepted: Orders

## 2024-02-28 ENCOUNTER — Other Ambulatory Visit: Payer: Self-pay

## 2024-02-28 LAB — COVID-19, FLU A+B AND RSV
Influenza A, NAA: NOT DETECTED
Influenza B, NAA: NOT DETECTED
RSV, NAA: NOT DETECTED
SARS-CoV-2, NAA: NOT DETECTED

## 2024-03-02 ENCOUNTER — Ambulatory Visit: Admitting: Internal Medicine

## 2024-03-11 ENCOUNTER — Other Ambulatory Visit: Payer: Self-pay | Admitting: Internal Medicine

## 2024-03-11 ENCOUNTER — Encounter: Payer: Self-pay | Admitting: Internal Medicine

## 2024-03-11 ENCOUNTER — Ambulatory Visit: Payer: Self-pay

## 2024-03-11 DIAGNOSIS — J3089 Other allergic rhinitis: Secondary | ICD-10-CM

## 2024-03-11 DIAGNOSIS — J019 Acute sinusitis, unspecified: Secondary | ICD-10-CM

## 2024-03-11 MED ORDER — AZITHROMYCIN 250 MG PO TABS: ORAL_TABLET | ORAL | 0 refills | Status: AC

## 2024-03-11 MED ORDER — METHYLPREDNISOLONE 4 MG PO TBPK
ORAL_TABLET | ORAL | 0 refills | Status: AC
Start: 2024-03-11 — End: ?

## 2024-03-11 NOTE — Telephone Encounter (Signed)
 FYI Only or Action Required?: Action required by provider: request for appointment.  Patient was last seen in primary care on 02/26/2024 by Tobie Suzzane POUR, MD.  Called Nurse Triage reporting Headache.  Symptoms began 02/26/2024 and worsening.  Interventions attempted: OTC medications: muccinex, tylenol .  Symptoms are: gradually worsening.  Triage Disposition: See PCP When Office is Open (Within 3 Days), See Physician Within 24 Hours  Patient/caregiver understands and will follow disposition?: Yes      Copied from CRM #[id number]. Topic: Clinical - Red Word Triage >> Mar 11, 2024  3:37 PM Avram MATSU wrote: Red Word that prompted transfer to Nurse Triage: body aches constantly, running nose all the time. Broke out in hives yesterday. Reason for Disposition  SEVERE coughing spells (e.g., whooping sound after coughing, vomiting after coughing)  [1] Widespread hives, itching or face swelling AND [2] onset > 2 hours after exposure to high-risk allergen (e.g., sting, nuts, 1st dose of antibiotic)  [1] MILD-MODERATE headache AND [2] present > 3 days (72 hours) AND [3] no improvement after using Care Advice  Answer Assessment - Initial Assessment Questions 1. LOCATION: Where does it hurt?      headache 2. ONSET: When did the headache start? (e.g., minutes, hours, days)      02/26/2024 and ongoing and worsening 3. PATTERN: Does the pain come and go, or has it been constant since it started?    Comes and goes 4. SEVERITY: How bad is the pain? and What does it keep you from doing?  (e.g., Scale 1-10; mild, moderate, or severe)     7 or 8/10 5. RECURRENT SYMPTOM: Have you ever had headaches before? If Yes, ask: When was the last time? and What happened that time?      no 6. CAUSE: What do you think is causing the headache?     unsure 7. MIGRAINE: Have you been diagnosed with migraine headaches? If Yes, ask: Is this headache similar?      no 8. HEAD INJURY: Has there  been any recent injury to your head?      no 9. OTHER SYMPTOMS: Do you have any other symptoms? (e.g., fever, stiff neck, eye pain, sore throat, cold symptoms)     Body aches, runny nose, tired, body aches sore throat, hives, diarrhea, nausea 10. PREGNANCY: Is there any chance you are pregnant? When was your last menstrual period?       na  Started taking COVID medication for 3 days and then stopped because test was negative .  Pt would like to schedule with PCP only for this Friday: no openings therefore request to send message to PCP to be worked into schedule.  Answer Assessment - Initial Assessment Questions 1. APPEARANCE: What does the rash look like?      Hives on right and then left side of face and peeling 2. LOCATION: Where is the rash located?      Monday 3. NUMBER: How many hives are there?      several 4. SIZE: How big are the hives? (e.g., inches, cm, compare to coins) Do they all look the same or do they vary in shape and size?      various 5. ONSET: When did the hives begin? (e.g., hours or days ago)      Monday 6. ITCHING: Does it itch? If Yes, ask: How bad is the itch?  (e.g., none, mild, moderate, severe)     Right side hives is sore 7. RECURRENT PROBLEM: Have you had  hives before? If Yes, ask: When was the last time? and What happened that time?      no 8. TRIGGERS: Were you exposed to any new food, plant, cosmetic product or animal just before the hives began?     unsure 9. OTHER SYMPTOMS: Do you have any other symptoms? (e.g., fever, tongue swelling, difficulty breathing, abdomen pain)     Left hand swelling below thumb and right hand swelling. 10. PREGNANCY: Is there any chance you are pregnant? When was your last menstrual period?       Na  First time taking elderberry and airborne  Answer Assessment - Initial Assessment Questions 1. ONSET: When did the cough begin?      02/26/2024 2. SEVERITY: How bad is the cough  today?      moderate 3. SPUTUM: Describe the color of your sputum (e.g., none, dry cough; clear, white, yellow, green)    yellowish 4. HEMOPTYSIS: Are you coughing up any blood? If Yes, ask: How much? (e.g., flecks, streaks, tablespoons, etc.)     no 5. DIFFICULTY BREATHING: Are you having difficulty breathing? If Yes, ask: How bad is it? (e.g., mild, moderate, severe)      Mild to moderate at times 6. FEVER: Do you have a fever? If Yes, ask: What is your temperature, how was it measured, and when did it start?     no 7. CARDIAC HISTORY: Do you have any history of heart disease? (e.g., heart attack, congestive heart failure)     HTN 8. LUNG HISTORY: Do you have any history of lung disease?  (e.g., pulmonary embolus, asthma, emphysema)     Sleep apnea 9. PE RISK FACTORS: Do you have a history of blood clots? (or: recent major surgery, recent prolonged travel, bedridden)     na 10. OTHER SYMPTOMS: Do you have any other symptoms? (e.g., runny nose, wheezing, chest pain)       Runny nose 11. PREGNANCY: Is there any chance you are pregnant? When was your last menstrual period?       na 12. TRAVEL: Have you traveled out of the country in the last month? (e.g., travel history, exposures)       Na  Pt also stated memory fog, urinary urgency  Protocols used: Headache-A-AH, Hives-A-AH, Cough - Acute Non-Productive-A-AH

## 2024-03-23 ENCOUNTER — Encounter: Payer: Self-pay | Admitting: Radiology

## 2024-04-09 ENCOUNTER — Other Ambulatory Visit: Payer: Self-pay | Admitting: Internal Medicine

## 2024-04-09 DIAGNOSIS — K219 Gastro-esophageal reflux disease without esophagitis: Secondary | ICD-10-CM

## 2024-05-04 ENCOUNTER — Ambulatory Visit: Admitting: Internal Medicine

## 2024-05-07 ENCOUNTER — Other Ambulatory Visit: Payer: Self-pay | Admitting: Internal Medicine

## 2024-05-07 DIAGNOSIS — E559 Vitamin D deficiency, unspecified: Secondary | ICD-10-CM

## 2024-06-05 ENCOUNTER — Encounter: Payer: Self-pay | Admitting: Internal Medicine

## 2024-06-16 NOTE — Telephone Encounter (Signed)
 VM full

## 2024-06-23 ENCOUNTER — Ambulatory Visit: Payer: Self-pay | Admitting: Internal Medicine

## 2024-06-24 ENCOUNTER — Ambulatory Visit: Payer: Self-pay

## 2024-06-24 DIAGNOSIS — Z1159 Encounter for screening for other viral diseases: Secondary | ICD-10-CM

## 2024-06-24 NOTE — Progress Notes (Signed)
 Patient is in office today for a nurse visit for PPD Skin test. Patient Injection was given in the  Left arm. Patient tolerated injection well.

## 2024-06-26 ENCOUNTER — Ambulatory Visit: Payer: Self-pay

## 2024-06-26 LAB — TB SKIN TEST
Induration: 0 mm
TB Skin Test: NEGATIVE
